# Patient Record
Sex: Male | Born: 1940 | Race: White | Hispanic: No | Marital: Married | Smoking: Never smoker
Health system: Southern US, Community
[De-identification: ages and names within clinical notes are randomized; demographics above are authoritative.]

## PROBLEM LIST (undated history)

## (undated) DIAGNOSIS — Z8719 Personal history of other diseases of the digestive system: Secondary | ICD-10-CM

## (undated) DIAGNOSIS — I4510 Unspecified right bundle-branch block: Secondary | ICD-10-CM

## (undated) DIAGNOSIS — E785 Hyperlipidemia, unspecified: Secondary | ICD-10-CM

## (undated) DIAGNOSIS — K219 Gastro-esophageal reflux disease without esophagitis: Secondary | ICD-10-CM

## (undated) DIAGNOSIS — Z8619 Personal history of other infectious and parasitic diseases: Secondary | ICD-10-CM

## (undated) DIAGNOSIS — I5032 Chronic diastolic (congestive) heart failure: Secondary | ICD-10-CM

## (undated) DIAGNOSIS — T7840XA Allergy, unspecified, initial encounter: Secondary | ICD-10-CM

## (undated) DIAGNOSIS — K922 Gastrointestinal hemorrhage, unspecified: Secondary | ICD-10-CM

## (undated) DIAGNOSIS — G43909 Migraine, unspecified, not intractable, without status migrainosus: Secondary | ICD-10-CM

## (undated) DIAGNOSIS — M5137 Other intervertebral disc degeneration, lumbosacral region: Secondary | ICD-10-CM

## (undated) DIAGNOSIS — E119 Type 2 diabetes mellitus without complications: Secondary | ICD-10-CM

## (undated) DIAGNOSIS — K635 Polyp of colon: Secondary | ICD-10-CM

## (undated) DIAGNOSIS — D329 Benign neoplasm of meninges, unspecified: Secondary | ICD-10-CM

## (undated) DIAGNOSIS — K649 Unspecified hemorrhoids: Secondary | ICD-10-CM

## (undated) DIAGNOSIS — H269 Unspecified cataract: Secondary | ICD-10-CM

## (undated) DIAGNOSIS — G709 Myoneural disorder, unspecified: Secondary | ICD-10-CM

## (undated) DIAGNOSIS — I2510 Atherosclerotic heart disease of native coronary artery without angina pectoris: Secondary | ICD-10-CM

## (undated) DIAGNOSIS — K259 Gastric ulcer, unspecified as acute or chronic, without hemorrhage or perforation: Secondary | ICD-10-CM

## (undated) DIAGNOSIS — I10 Essential (primary) hypertension: Secondary | ICD-10-CM

## (undated) DIAGNOSIS — N400 Enlarged prostate without lower urinary tract symptoms: Secondary | ICD-10-CM

## (undated) DIAGNOSIS — H409 Unspecified glaucoma: Secondary | ICD-10-CM

## (undated) DIAGNOSIS — K2270 Barrett's esophagus without dysplasia: Secondary | ICD-10-CM

## (undated) DIAGNOSIS — I240 Acute coronary thrombosis not resulting in myocardial infarction: Secondary | ICD-10-CM

## (undated) HISTORY — DX: Other intervertebral disc degeneration, lumbosacral region: M51.37

## (undated) HISTORY — PX: DIAGNOSTIC LAPAROSCOPY: SUR761

## (undated) HISTORY — PX: ESOPHAGUS SURGERY: SHX626

## (undated) HISTORY — PX: CORONARY ANGIOPLASTY: SHX604

## (undated) HISTORY — DX: Chronic diastolic (congestive) heart failure: I50.32

## (undated) HISTORY — PX: HERNIA REPAIR: SHX51

## (undated) HISTORY — PX: POLYPECTOMY: SHX149

## (undated) HISTORY — PX: CHOLECYSTECTOMY: SHX55

## (undated) HISTORY — PX: CARDIAC SURGERY: SHX584

## (undated) HISTORY — PX: EYE SURGERY: SHX253

## (undated) HISTORY — PX: HIATAL HERNIA REPAIR: SHX195

## (undated) HISTORY — PX: COLONOSCOPY: SHX174

## (undated) SURGERY — Surgical Case
Anesthesia: *Unknown

---

## 1990-07-17 HISTORY — PX: ATHERECTOMY: SHX47

## 2006-09-24 ENCOUNTER — Ambulatory Visit: Payer: Self-pay | Admitting: Urology

## 2006-09-24 ENCOUNTER — Other Ambulatory Visit: Payer: Self-pay

## 2006-10-03 ENCOUNTER — Ambulatory Visit: Payer: Self-pay | Admitting: Urology

## 2007-07-19 ENCOUNTER — Emergency Department: Payer: Self-pay | Admitting: Emergency Medicine

## 2007-07-19 ENCOUNTER — Other Ambulatory Visit: Payer: Self-pay

## 2008-11-03 ENCOUNTER — Ambulatory Visit: Payer: Self-pay | Admitting: Unknown Physician Specialty

## 2008-12-24 ENCOUNTER — Ambulatory Visit: Payer: Self-pay | Admitting: Unknown Physician Specialty

## 2009-02-08 ENCOUNTER — Ambulatory Visit: Payer: Self-pay | Admitting: Urology

## 2009-04-27 ENCOUNTER — Inpatient Hospital Stay: Payer: Self-pay | Admitting: Internal Medicine

## 2010-04-26 ENCOUNTER — Ambulatory Visit: Payer: Self-pay | Admitting: Internal Medicine

## 2010-07-17 DIAGNOSIS — Z8719 Personal history of other diseases of the digestive system: Secondary | ICD-10-CM

## 2010-07-17 HISTORY — DX: Personal history of other diseases of the digestive system: Z87.19

## 2011-02-10 ENCOUNTER — Inpatient Hospital Stay: Payer: Self-pay | Admitting: Internal Medicine

## 2011-04-24 DIAGNOSIS — D320 Benign neoplasm of cerebral meninges: Secondary | ICD-10-CM | POA: Insufficient documentation

## 2011-08-23 ENCOUNTER — Emergency Department: Payer: Self-pay | Admitting: Emergency Medicine

## 2011-08-23 LAB — CK TOTAL AND CKMB (NOT AT ARMC): CK-MB: 2 ng/mL (ref 0.5–3.6)

## 2011-08-23 LAB — COMPREHENSIVE METABOLIC PANEL
Albumin: 3.9 g/dL (ref 3.4–5.0)
Alkaline Phosphatase: 88 U/L (ref 50–136)
Anion Gap: 9 (ref 7–16)
BUN: 24 mg/dL — ABNORMAL HIGH (ref 7–18)
Bilirubin,Total: 0.3 mg/dL (ref 0.2–1.0)
Calcium, Total: 9 mg/dL (ref 8.5–10.1)
Chloride: 106 mmol/L (ref 98–107)
Creatinine: 1.3 mg/dL (ref 0.60–1.30)
Glucose: 113 mg/dL — ABNORMAL HIGH (ref 65–99)
Osmolality: 290 (ref 275–301)
Potassium: 4.5 mmol/L (ref 3.5–5.1)
SGOT(AST): 41 U/L — ABNORMAL HIGH (ref 15–37)
SGPT (ALT): 35 U/L
Total Protein: 7.4 g/dL (ref 6.4–8.2)

## 2011-08-23 LAB — CBC
HCT: 43.8 % (ref 40.0–52.0)
HGB: 14.7 g/dL (ref 13.0–18.0)
MCH: 32.3 pg (ref 26.0–34.0)
MCHC: 33.5 g/dL (ref 32.0–36.0)
WBC: 10.7 10*3/uL — ABNORMAL HIGH (ref 3.8–10.6)

## 2011-08-23 LAB — URINALYSIS, COMPLETE
Bilirubin,UR: NEGATIVE
Blood: NEGATIVE
Glucose,UR: NEGATIVE mg/dL (ref 0–75)
Ketone: NEGATIVE
Leukocyte Esterase: NEGATIVE
RBC,UR: 1 /HPF (ref 0–5)
Specific Gravity: 1.024 (ref 1.003–1.030)

## 2011-08-25 ENCOUNTER — Emergency Department: Payer: Self-pay | Admitting: Emergency Medicine

## 2011-08-25 LAB — URINALYSIS, COMPLETE
Bacteria: NONE SEEN
Glucose,UR: NEGATIVE mg/dL (ref 0–75)
Hyaline Cast: 5
Leukocyte Esterase: NEGATIVE
Nitrite: NEGATIVE
Protein: 100
RBC,UR: 1 /HPF (ref 0–5)
WBC UR: 1 /HPF (ref 0–5)

## 2011-08-25 LAB — COMPREHENSIVE METABOLIC PANEL
Alkaline Phosphatase: 172 U/L — ABNORMAL HIGH (ref 50–136)
Anion Gap: 8 (ref 7–16)
Bilirubin,Total: 1.3 mg/dL — ABNORMAL HIGH (ref 0.2–1.0)
Calcium, Total: 9.6 mg/dL (ref 8.5–10.1)
Chloride: 107 mmol/L (ref 98–107)
Co2: 29 mmol/L (ref 21–32)
Creatinine: 1.3 mg/dL (ref 0.60–1.30)
EGFR (African American): >60
Osmolality: 292 (ref 275–301)
Potassium: 4.8 mmol/L (ref 3.5–5.1)
Sodium: 144 mmol/L (ref 136–145)

## 2011-08-25 LAB — CBC
HCT: 44 % (ref 40.0–52.0)
HGB: 14.6 g/dL (ref 13.0–18.0)
MCH: 31.9 pg (ref 26.0–34.0)
MCHC: 33.2 g/dL (ref 32.0–36.0)
MCV: 96 fL (ref 80–100)
Platelet: 212 10*3/uL (ref 150–440)
RDW: 12.8 % (ref 11.5–14.5)
WBC: 9.5 10*3/uL (ref 3.8–10.6)

## 2011-08-25 LAB — PROTIME-INR
INR: 1
Prothrombin Time: 13.4 secs (ref 11.5–14.7)

## 2011-08-25 LAB — LIPASE, BLOOD: Lipase: 167 U/L (ref 73–393)

## 2011-08-25 LAB — APTT: Activated PTT: 33.2 secs (ref 23.6–35.9)

## 2011-09-08 ENCOUNTER — Ambulatory Visit: Payer: Self-pay | Admitting: Surgery

## 2011-09-08 LAB — CBC WITH DIFFERENTIAL/PLATELET
Basophil #: 0.1 10*3/uL (ref 0.0–0.1)
Basophil %: 1.1 %
Eosinophil #: 0.2 10*3/uL (ref 0.0–0.7)
HCT: 44.3 % (ref 40.0–52.0)
HGB: 14.6 g/dL (ref 13.0–18.0)
Lymphocyte #: 1.1 10*3/uL (ref 1.0–3.6)
Lymphocyte %: 17.1 %
MCH: 32.1 pg (ref 26.0–34.0)
MCHC: 32.8 g/dL (ref 32.0–36.0)
Monocyte #: 0.3 10*3/uL (ref 0.0–0.7)
Neutrophil #: 4.9 10*3/uL (ref 1.4–6.5)
RDW: 13.4 % (ref 11.5–14.5)

## 2011-09-08 LAB — BASIC METABOLIC PANEL
Anion Gap: 11 (ref 7–16)
BUN: 23 mg/dL — ABNORMAL HIGH (ref 7–18)
Calcium, Total: 10 mg/dL (ref 8.5–10.1)
Chloride: 105 mmol/L (ref 98–107)
Creatinine: 1.33 mg/dL — ABNORMAL HIGH (ref 0.60–1.30)
EGFR (African American): >60
Osmolality: 289 (ref 275–301)
Potassium: 4.2 mmol/L (ref 3.5–5.1)

## 2011-09-08 LAB — HEPATIC FUNCTION PANEL A (ARMC)
Alkaline Phosphatase: 146 U/L — ABNORMAL HIGH (ref 50–136)
Bilirubin, Direct: 0.1 mg/dL (ref 0.00–0.20)
Bilirubin,Total: 0.5 mg/dL (ref 0.2–1.0)
SGPT (ALT): 40 U/L
Total Protein: 7.8 g/dL (ref 6.4–8.2)

## 2011-09-15 ENCOUNTER — Ambulatory Visit: Payer: Self-pay | Admitting: Surgery

## 2011-09-16 LAB — COMPREHENSIVE METABOLIC PANEL
Albumin: 3 g/dL — ABNORMAL LOW (ref 3.4–5.0)
Anion Gap: 9 (ref 7–16)
BUN: 19 mg/dL — ABNORMAL HIGH (ref 7–18)
Bilirubin,Total: 0.8 mg/dL (ref 0.2–1.0)
Chloride: 107 mmol/L (ref 98–107)
Co2: 26 mmol/L (ref 21–32)
Creatinine: 1.31 mg/dL — ABNORMAL HIGH (ref 0.60–1.30)
Glucose: 98 mg/dL (ref 65–99)
Osmolality: 285 (ref 275–301)
Potassium: 4.3 mmol/L (ref 3.5–5.1)
Sodium: 142 mmol/L (ref 136–145)

## 2011-09-16 LAB — CBC WITH DIFFERENTIAL/PLATELET
Basophil #: 0 10*3/uL (ref 0.0–0.1)
Eosinophil #: 0.2 10*3/uL (ref 0.0–0.7)
HCT: 37.7 % — ABNORMAL LOW (ref 40.0–52.0)
HGB: 12.4 g/dL — ABNORMAL LOW (ref 13.0–18.0)
MCH: 31.9 pg (ref 26.0–34.0)
MCV: 97 fL (ref 80–100)
Monocyte %: 5.6 %
Neutrophil #: 8.6 10*3/uL — ABNORMAL HIGH (ref 1.4–6.5)
Neutrophil %: 86.3 %
Platelet: 174 10*3/uL (ref 150–440)
RBC: 3.9 10*6/uL — ABNORMAL LOW (ref 4.40–5.90)
WBC: 10 10*3/uL (ref 3.8–10.6)

## 2011-09-28 ENCOUNTER — Ambulatory Visit: Payer: Self-pay | Admitting: Surgery

## 2011-09-28 LAB — COMPREHENSIVE METABOLIC PANEL
Albumin: 3.7 g/dL (ref 3.4–5.0)
Anion Gap: 6 — ABNORMAL LOW (ref 7–16)
BUN: 18 mg/dL (ref 7–18)
Bilirubin,Total: 0.5 mg/dL (ref 0.2–1.0)
Calcium, Total: 9.5 mg/dL (ref 8.5–10.1)
Creatinine: 1.44 mg/dL — ABNORMAL HIGH (ref 0.60–1.30)
EGFR (African American): >60
EGFR (Non-African Amer.): 51 — ABNORMAL LOW
Glucose: 100 mg/dL — ABNORMAL HIGH (ref 65–99)
Potassium: 4.5 mmol/L (ref 3.5–5.1)
SGOT(AST): 24 U/L (ref 15–37)
SGPT (ALT): 59 U/L
Total Protein: 7.4 g/dL (ref 6.4–8.2)

## 2011-10-11 ENCOUNTER — Emergency Department: Payer: Self-pay | Admitting: *Deleted

## 2011-10-11 LAB — COMPREHENSIVE METABOLIC PANEL
Albumin: 3.9 g/dL (ref 3.4–5.0)
Anion Gap: 9 (ref 7–16)
BUN: 23 mg/dL — ABNORMAL HIGH (ref 7–18)
Bilirubin,Total: 0.3 mg/dL (ref 0.2–1.0)
Chloride: 106 mmol/L (ref 98–107)
Co2: 27 mmol/L (ref 21–32)
EGFR (African American): >60
EGFR (Non-African Amer.): >60
Glucose: 114 mg/dL — ABNORMAL HIGH (ref 65–99)
Osmolality: 288 (ref 275–301)
SGPT (ALT): 31 U/L
Sodium: 142 mmol/L (ref 136–145)
Total Protein: 7.5 g/dL (ref 6.4–8.2)

## 2011-10-11 LAB — TROPONIN I: Troponin-I: <0.02 ng/mL

## 2011-10-11 LAB — CBC
HCT: 43 % (ref 40.0–52.0)
HGB: 14.4 g/dL (ref 13.0–18.0)
MCV: 97 fL (ref 80–100)
Platelet: 241 10*3/uL (ref 150–440)
RBC: 4.43 10*6/uL (ref 4.40–5.90)
WBC: 12.3 10*3/uL — ABNORMAL HIGH (ref 3.8–10.6)

## 2011-11-01 ENCOUNTER — Ambulatory Visit: Payer: Self-pay | Admitting: Internal Medicine

## 2011-12-13 ENCOUNTER — Ambulatory Visit: Payer: Self-pay | Admitting: Gastroenterology

## 2012-03-04 ENCOUNTER — Ambulatory Visit: Payer: Self-pay | Admitting: Neurology

## 2012-03-04 LAB — CREATININE, SERUM
Creatinine: 1.31 mg/dL — ABNORMAL HIGH (ref 0.60–1.30)
EGFR (African American): >60

## 2012-04-01 DIAGNOSIS — N138 Other obstructive and reflux uropathy: Secondary | ICD-10-CM | POA: Insufficient documentation

## 2012-04-01 DIAGNOSIS — N529 Male erectile dysfunction, unspecified: Secondary | ICD-10-CM | POA: Insufficient documentation

## 2012-04-01 DIAGNOSIS — N401 Enlarged prostate with lower urinary tract symptoms: Secondary | ICD-10-CM | POA: Insufficient documentation

## 2012-04-01 DIAGNOSIS — R972 Elevated prostate specific antigen [PSA]: Secondary | ICD-10-CM | POA: Insufficient documentation

## 2012-04-01 DIAGNOSIS — D075 Carcinoma in situ of prostate: Secondary | ICD-10-CM | POA: Diagnosis present

## 2012-04-10 IMAGING — CR DG CHEST 2V
1 series · 2 of 2 positions shown · non-contrast
Comparison: none

REASON FOR EXAM: pain
COMMENTS:   May transport without cardiac monitor

PROCEDURE:     DXR - DXR CHEST PA (OR AP) AND LATERAL  - October 11, 2011  [DATE]
RESULT:     Comparison: 09/08/2011

[Series 1: w chest pa · 0.14mm/px · 2 of 2 slices shown]
[im 1/2]
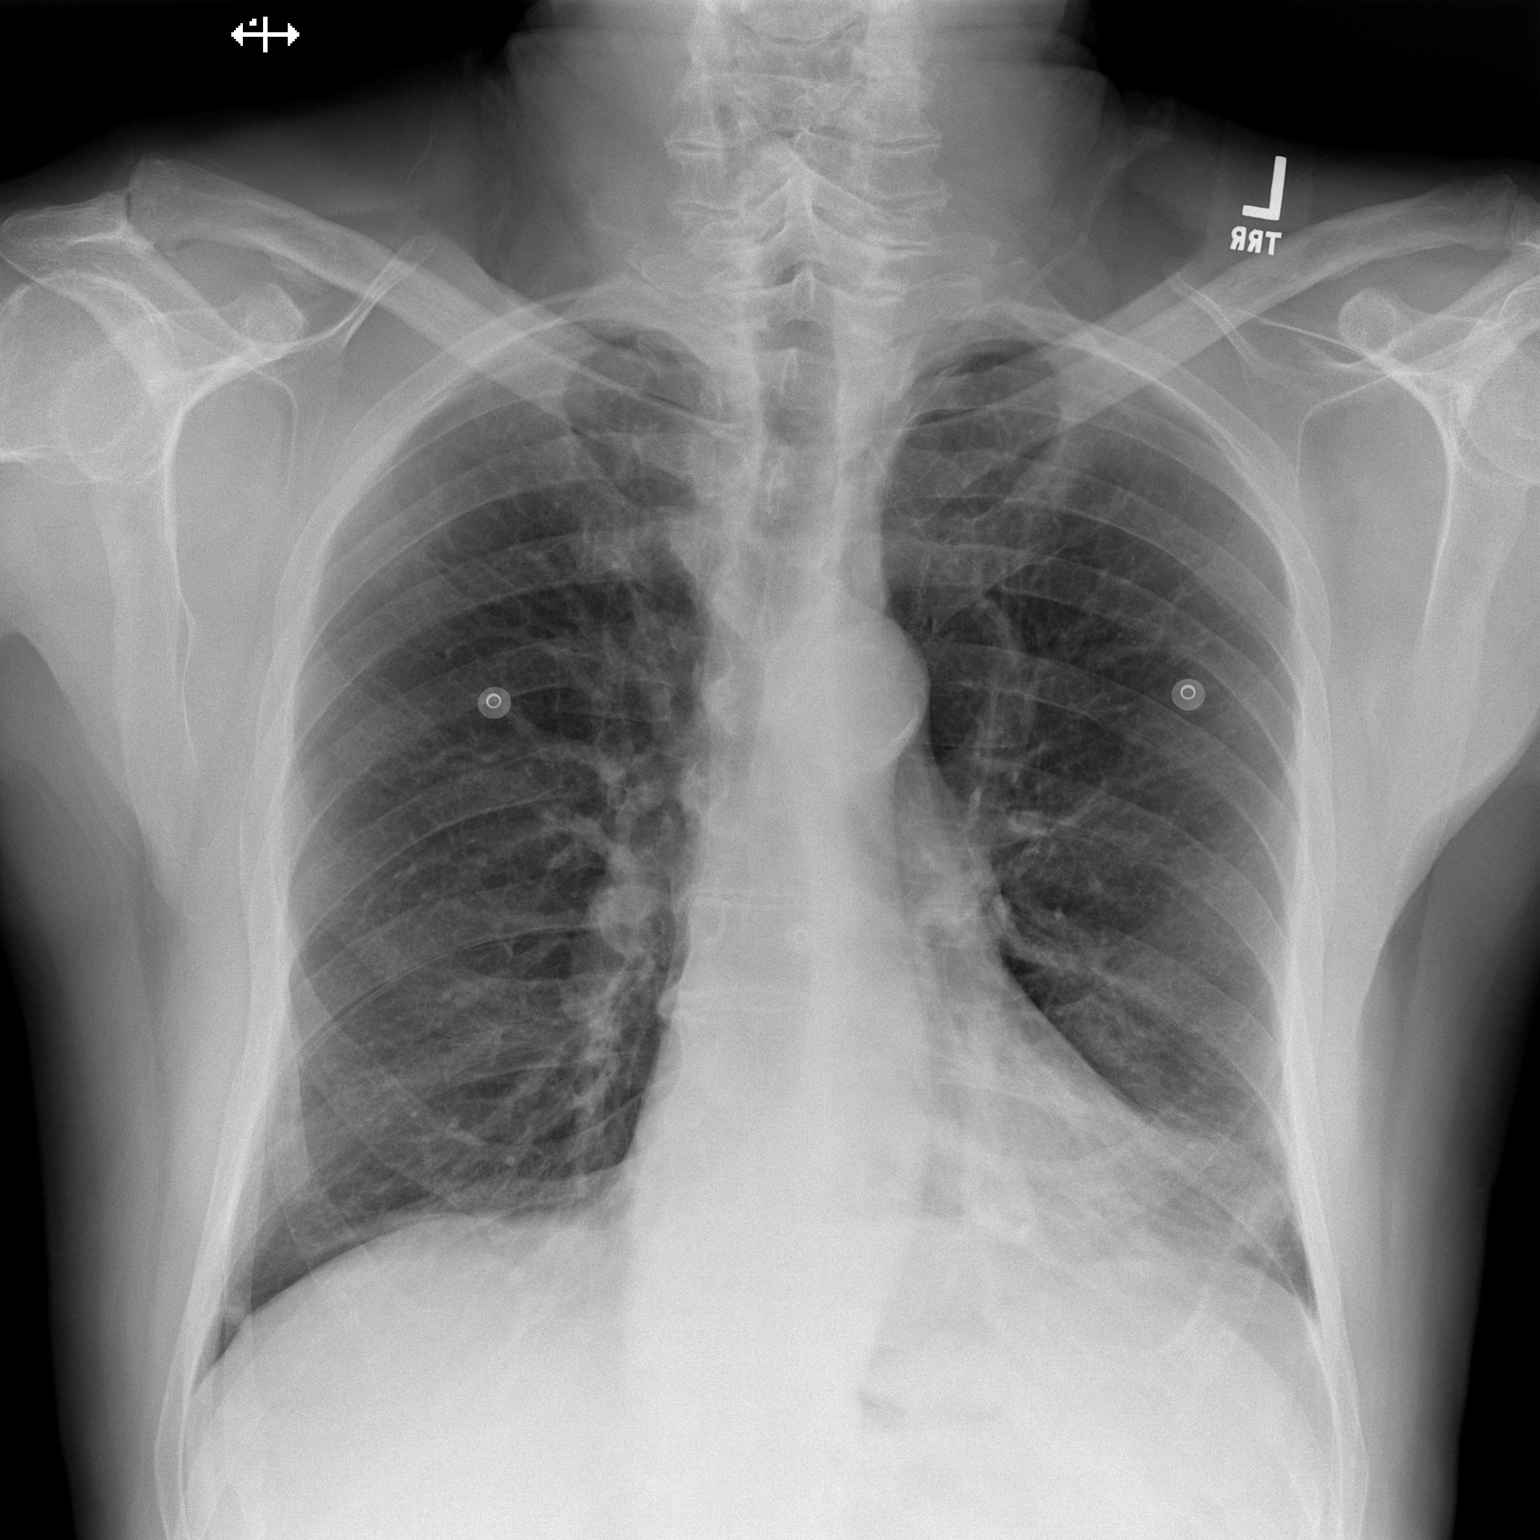
[im 2/2]
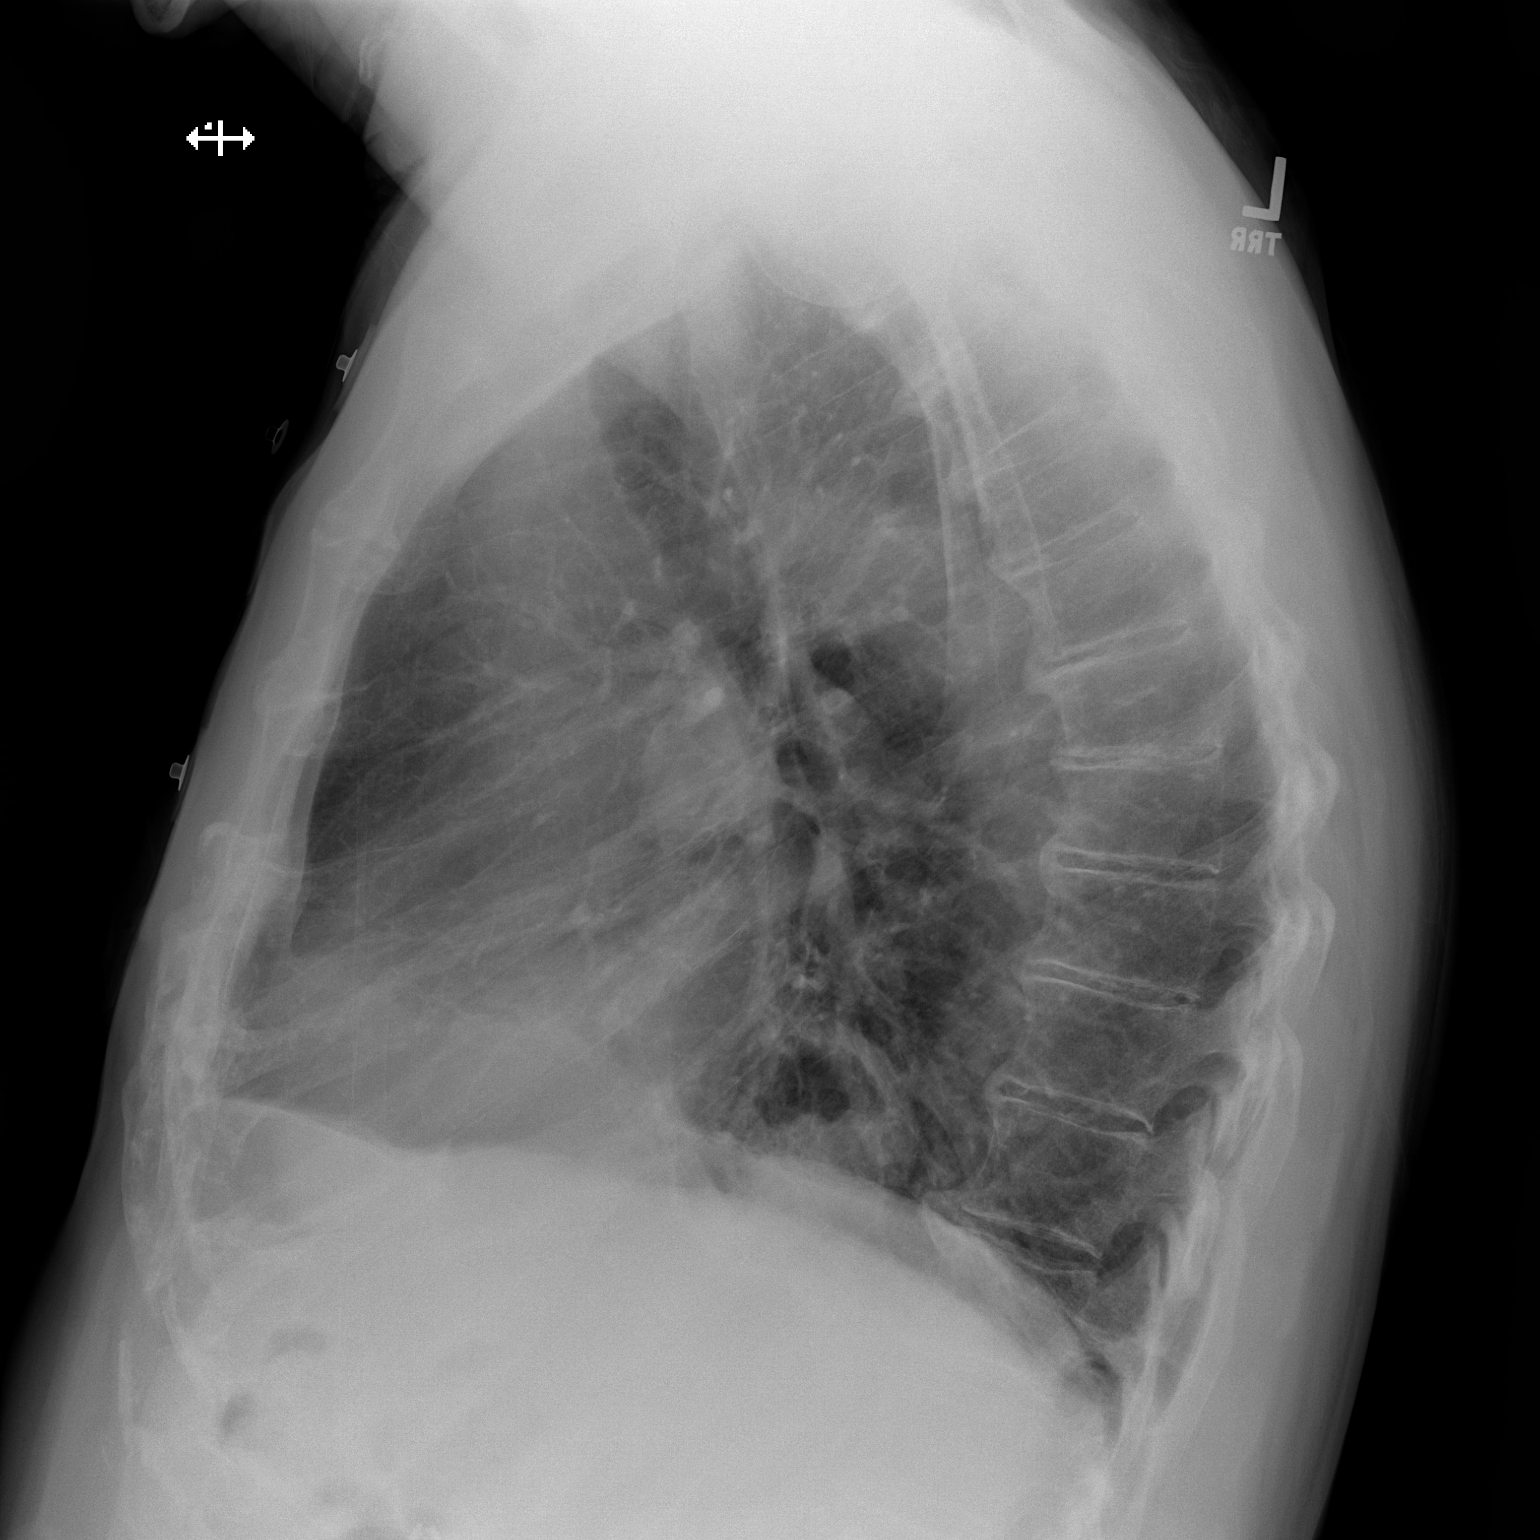

[2 of 2 positions shown; findings below may reference images not displayed]

FINDINGS: The heart and mediastinum are stable. There is mild hyperinflation. Minimal
left basilar reticular opacities may represent atelectasis or scarring.
There is a small hiatal hernia.
IMPRESSION: Minimal left basilar opacities may represent atelectasis or scarring.
However, followup is suggested to ensure resolution/stability.

## 2012-07-26 ENCOUNTER — Ambulatory Visit: Payer: Self-pay | Admitting: Gastroenterology

## 2012-08-21 ENCOUNTER — Ambulatory Visit: Payer: Self-pay | Admitting: Surgery

## 2012-09-25 ENCOUNTER — Ambulatory Visit: Payer: Self-pay | Admitting: Surgery

## 2012-09-25 LAB — CBC
HCT: 42.4 % (ref 40.0–52.0)
MCHC: 33.7 g/dL (ref 32.0–36.0)
MCV: 96 fL (ref 80–100)
Platelet: 214 10*3/uL (ref 150–440)
RBC: 4.42 10*6/uL (ref 4.40–5.90)
RDW: 13.3 % (ref 11.5–14.5)

## 2012-09-25 LAB — BASIC METABOLIC PANEL
Anion Gap: 7 (ref 7–16)
BUN: 26 mg/dL — ABNORMAL HIGH (ref 7–18)
Chloride: 109 mmol/L — ABNORMAL HIGH (ref 98–107)
Co2: 24 mmol/L (ref 21–32)
EGFR (African American): >60
Potassium: 4.3 mmol/L (ref 3.5–5.1)

## 2012-10-04 ENCOUNTER — Inpatient Hospital Stay: Payer: Self-pay | Admitting: Surgery

## 2012-12-13 ENCOUNTER — Ambulatory Visit: Payer: Self-pay | Admitting: Gastroenterology

## 2013-02-19 ENCOUNTER — Ambulatory Visit: Payer: Self-pay | Admitting: Gastroenterology

## 2013-02-19 HISTORY — PX: ESOPHAGOGASTRODUODENOSCOPY: SHX1529

## 2013-02-20 LAB — PATHOLOGY REPORT

## 2013-10-09 DIAGNOSIS — I209 Angina pectoris, unspecified: Secondary | ICD-10-CM | POA: Insufficient documentation

## 2013-10-09 DIAGNOSIS — K229 Disease of esophagus, unspecified: Secondary | ICD-10-CM | POA: Insufficient documentation

## 2013-10-09 DIAGNOSIS — D126 Benign neoplasm of colon, unspecified: Secondary | ICD-10-CM | POA: Insufficient documentation

## 2013-10-15 ENCOUNTER — Ambulatory Visit: Payer: Self-pay | Admitting: Unknown Physician Specialty

## 2014-01-12 DIAGNOSIS — R079 Chest pain, unspecified: Secondary | ICD-10-CM | POA: Insufficient documentation

## 2014-03-09 ENCOUNTER — Ambulatory Visit: Payer: Self-pay | Admitting: Gastroenterology

## 2014-03-13 ENCOUNTER — Ambulatory Visit: Payer: Self-pay | Admitting: Gastroenterology

## 2014-06-05 DIAGNOSIS — I340 Nonrheumatic mitral (valve) insufficiency: Secondary | ICD-10-CM | POA: Insufficient documentation

## 2014-06-05 DIAGNOSIS — E782 Mixed hyperlipidemia: Secondary | ICD-10-CM | POA: Insufficient documentation

## 2014-06-05 DIAGNOSIS — I10 Essential (primary) hypertension: Secondary | ICD-10-CM

## 2014-06-05 HISTORY — DX: Essential (primary) hypertension: I10

## 2014-06-16 DIAGNOSIS — K219 Gastro-esophageal reflux disease without esophagitis: Secondary | ICD-10-CM | POA: Insufficient documentation

## 2014-06-17 ENCOUNTER — Ambulatory Visit: Payer: Self-pay

## 2014-06-26 ENCOUNTER — Ambulatory Visit: Payer: Self-pay | Admitting: Family Medicine

## 2014-06-28 ENCOUNTER — Ambulatory Visit: Payer: Self-pay

## 2014-11-06 NOTE — Op Note (Signed)
PATIENT NAME:  Bradley [name], Bradley [name] MR#:  [id number] DATE OF BIRTH:  1940/10/21  DATE OF PROCEDURE:  10/04/2012  PREOPERATIVE DIAGNOSIS:  Hiatus hernia with chronic gastroesophageal reflux.   POSTOPERATIVE DIAGNOSIS: Hiatus hernia with chronic gastroesophageal reflux.  PROCEDURE: Laparoscopic hiatus hernia repair with fundoplication.   SURGEON:  Rochel Brome, M.D.   ANESTHESIA: General.   INDICATIONS: This 74 year old male has a long history of gastroesophageal reflux and x-ray findings of paraesophageal hiatus hernia. Surgery was recommended for definitive treatment.   DESCRIPTION OF PROCEDURE: The patient was placed on the operating table in the supine position under general endotracheal anesthesia. The abdomen was prepared with ChloraPrep, draped in a sterile manner.   A short incision was made in the epigastrium just about an inch and a half above the umbilicus and carried down to the deep fascia which was grasped with tooth pickups and elevated. A Veress needle was inserted into the peritoneal cavity, aspirated and irrigated with a saline solution.  Next, the peritoneal cavity was inflated with carbon dioxide. The Veress needle was removed. The 10 mm cannula was inserted. The 10 mm, 30 degree laparoscope was inserted to view the peritoneal cavity. The liver appeared smooth. There was some mild distention of the stomach. I did have the anesthetist insert an orogastric tube to decompress the stomach. Another incision was made in the subxiphoid position to insert another 11 mm cannula. Another incision made in the left upper quadrant at the costal margin at the anterior axillary line to insert an 11 mm cannula. Another incision made in the right upper quadrant at the costal margin at the midclavicular line to insert an 11 mm cannula, another midway between that point and the camera site and ultimately another in the right upper quadrant at the anterior axillary line for a total of 6 ports sites. The  patient was placed in the reverse Trendelenburg position. The fan retractor was introduced through the subxiphoid port and was used to retract the left lobe of the liver anteriorly and superiorly and this was held in place with a Bookwalter mechanical arm. This exposed the diaphragmatic hiatus. There was herniation of the stomach up through an enlarged hiatus. Traction was applied as the stomach was initially partially reduced. There was a large amount of fat in the immediate area of the operation.  Next, the gastrohepatic ligament was incised with Harmonic scalpel. The dissection of the hernia sac was began anteriorly from right to left going along the edge of the diaphragmatic hiatus dividing the sac with the Harmonic scalpel.  Next, with somewhat tedious and persistent dissection, a large amount of omentum was brought down into the abdomen and also the remainder of the stomach was delivered down into the abdominal cavity. Circumferential dissection was carried out around the esophagus and completely dissected the hernia sac circumferentially. The right and left crura of the diaphragm were identified. A window was created posterior to the esophagus and was triangulated to some 3 cm and Babcock clamp was placed posterior to the esophagus extending from right to left. It is noted that during the course of the procedure, the fatty tissues were retracted as needed and the Babcock clamp on the stomach was repositioned as needed.  The repair was carried out with a row of 0-Surgidac simple and figure-of-eight sutures suturing the right crus of the diaphragm to the left crus and this was carried out to narrow the hiatus and used a 10 mm stone scoop to size the hiatus and left  enough opening to allow swallowing, so that the stone scoop would easily fit beside the esophagus.  The stone scoop was then removed.   Next, a Gore-Tex Bio-A mesh was selected, had a slot cut out to straddle the esophagus and some of the  corners were trimmed. The mesh was placed over the repair and was loosely placed across the posterior aspect of the esophagus.  Next, a mesh was attached to the diaphragm with the ProTack stapler.  It is noted during the course of the procedure, there was minimal degree of blood loss. Small amounts of blood were aspirated. Hemostasis subsequently was intact.  Next, the fundoplication was carried out, passing a portion of the fundus from left to right posterior to the esophagus and held with a Babcock clamp. Another portion of the fundus was brought adjacent to this as fatty tissues were retracted inferiorly. The fundoplication was carried out with 4 sutures of 0-Surgidac. The fundoplication was satisfactorily floppy. As noted, the Surgidac was used for both the hernia repair and for the fundoplication.  Hemostasis appeared to be intact.  Next, as the cannulas were removed, I did note some bleeding from the right upper quadrant port site at the midclavicular line and the deep tissue surrounding this were infiltrated with 0.5% Sensorcaine with epinephrine and also there was some scant bleeding from the lateralmost port site on the right and this area was also infiltrated and I did also infiltrate the subcuticular layer for all 6 incisions. All the cannulas were removed and carbon dioxide was allowed to escape in the peritoneal cavity. Skin incisions were closed with interrupted 4-0 chromic subcuticular suture and Dermabond. The patient tolerated surgery satisfactorily. Due to the long case and more than 2 liters of IV fluids, I elected to insert a Foley catheter and had the circulating nurse insert this.  The  catheter did go in easily and I inflated the balloon with 5 mL of water. There was minimal urine output at first and decision was made to leave the catheter in to recovery room and subsequently in the recovery room did note some gross hematuria and I elected to keep the catheter in until resolution of  bleeding and the patient was carried to the operating room and otherwise in satisfactory condition.    ____________________________ Lenna Sciara. Rochel Brome, MD jws:ce D: 10/04/2012 18:41:55 ET T: 10/05/2012 12:02:29 ET JOB#: [id number]  cc: Loreli Dollar, MD, <Dictator> Loreli Dollar MD ELECTRONICALLY SIGNED 10/10/2012 20:48

## 2014-11-08 NOTE — H&P (Signed)
PATIENT NAME:  Bradley [name], SCHWEGLER MR#:  [id number] DATE OF BIRTH:  Jul 19, 1940  DATE OF ADMISSION:  09/15/2011  CHIEF COMPLAINT: Epigastric pain.   HISTORY OF PRESENT ILLNESS: This is a patient who has been in the emergency room  multiple times. He has had multiple episodes of epigastric and right upper quadrant pain associated with fatty food intolerance. He has had nausea, gas, bloating, and distention. He has no jaundice or acholic stools, but has had elevated liver function tests in the past. He is here for elective laparoscopic cholecystectomy with cholangiography.   PAST MEDICAL HISTORY:  1. Reflux disease. 2. Heart disease. 3. Migraines. 4. Gallstones.   PAST SURGICAL HISTORY:  1. Angioplasty. 2. Cataract extraction.   MEDICATIONS: Atorvastatin, Avodart, and omeprazole.   ALLERGIES: Sulfonamides.   SOCIAL HISTORY: The patient does not drink alcohol and has never been a smoker. He is retired.   FAMILY HISTORY: Noncontributory.   REVIEW OF SYSTEMS: Ten systems have been reviewed and is negative with the exception of that mentioned in the history of present illness.   NOTE: The patient has been cleared for surgery by Dr. Nehemiah Massed.    PHYSICAL EXAMINATION:   GENERAL: Healthy Caucasian male patient.   HEENT: No scleral icterus.   NECK: No palpable neck nodes.   CHEST: Clear to auscultation.   CARDIAC: Regular rate and rhythm.   ABDOMEN: Soft and nontender.   EXTREMITIES: No edema. Calves are nontender.   NEUROLOGIC: Grossly intact.   INTEGUMENT: No jaundice.   LABS: Laboratory tests showed elevated liver function tests back in the first week in February, none have been repeated.   ASSESSMENT AND PLAN: This is a patient with symptomatic gallstones. He has also had elevated liver function tests. The plan is to perform laparoscopic cholecystectomy with cholangiography. The rationale for this has been discussed, the options of observation have been reviewed, and the risks  of bleeding, infection, bile duct damage, bile duct leak, and retained common bile duct stone, any of which could require further surgery and/or ERCP, stent, and papillotomy have all been reviewed with him. He understood and agreed to proceed. ____________________________ Jerrol Banana Burt Knack, MD rec:slb D: 09/14/2011 20:29:28 ET     T: 09/15/2011 07:17:19 ET        JOB#: [id number] cc: Jerrol Banana. Burt Knack, MD, <Dictator> Florene Glen MD ELECTRONICALLY SIGNED 09/15/2011 11:31

## 2014-11-08 NOTE — Op Note (Signed)
PATIENT NAME:  Bradley [name], Bradley [name] MR#:  [id number] DATE OF BIRTH:  1940-12-03  DATE OF PROCEDURE:  09/15/2011  PREOPERATIVE DIAGNOSIS: Cholelithiasis.   POSTOPERATIVE DIAGNOSIS: Choledocholithiasis.  PROCEDURE: Laparoscopic cholecystectomy with C-arm fluoroscopic cholangiography.   SURGEON: Janae Bonser E. Burt Knack, MD  ANESTHESIA: General with endotracheal tube.   INDICATIONS: This is a patient with recurrent episodes of right upper quadrant pain associated with fatty food intolerance. He has been to the ED multiple times and he has shown elevated liver function tests in the past. Preoperatively we discussed rationale for surgery, the options of observation, risk of bleeding, infection, recurrence of symptoms, failure to resolve his symptoms, open procedure, bile duct damage, bile duct leak, retained common bile duct stone, any of which could require further surgery and/or ERCP, stent, and papillotomy. This was all reviewed for he and his wife in the preop holding area. They understood and agreed to proceed.   FINDINGS: Initially on C-arm fluoroscopic cholangiography it was clear that the cystic duct had been cannulated. The proximal ducts were well identified and in the distal common bile duct there was what appeared to be sludge fairly irregular in shape inside a dilated distal common bile duct. It moved with injection but persisted.   After administration of glucagon these intraluminal filling defects disappeared and there was ghosting of the duodenum without a large amount of flow into the duodenum but the intraluminal defects were no longer identified.   DESCRIPTION OF PROCEDURE: Patient was induced to general anesthesia, given IV antibiotics, VTE prophylaxis was in place. He was prepped and draped in sterile fashion. Marcaine was infiltrated in the skin and subcutaneous tissues around the periumbilical area. Incision was made. Veress needle was placed. Pneumoperitoneum was obtained. 5 mm trocar port  was placed. The abdominal cavity was explored and under direct vision a 10 mm epigastric port and two lateral 5 mm ports were placed. The gallbladder was placed on tension. Peritoneum over the infundibulum was incised bluntly. The cystic duct gallbladder junction was well identified, clipped and incised and through a separate incision an Angiocath cholangiogram catheter was placed. C-arm fluoroscopic cholangiography demonstrated sludge in the bile duct and at this point with it persisting C-arm fluoroscopy was halted for a moment. Patient was given 1 mg of IV glucagon and after appropriate time period irrigation with 20 mL of saline was performed. Cholangiography was repeated. The intraluminal defects were no longer identified in the dilated duct and there was typical ghosting of the duodenum but there was no large amount of flow into the duodenum but again no intraluminal defects were identified.  At this point the cystic duct catheter was removed. The cystic duct was doubly clipped and divided. The gallbladder was taken from the gallbladder fossa with electrocautery after doubly clipping and dividing two separate branches of the cystic artery and then the gallbladder was completely removed from the gallbladder fossa. Hemostasis was with electrocautery. The gallbladder was passed out through the epigastric port site with the aid of an Endo Catch bag. The area was checked for hemostasis, irrigated with copious amounts of normal saline. There was no sign of bleeding, bile leak, or bowel injury. Camera was placed in the epigastric site to view back at the periumbilical site. Again, no sign of bowel injury or adhesions. Therefore pneumoperitoneum was released. All ports were removed. Fascial edges at the epigastric site were approximated with 0 Vicryl figure-of-eight sutures and then 4-0 subcuticular Monocryl was used on all skin edges. Steri-Strips, Mastisol, and sterile dressings  were placed.   Patient tolerated  the procedure well. There were no complications. He was taken to the recovery was stable condition to be admitted for continued care.   ____________________________ Bradley [name]. Burt Knack, MD rec:cms D: 09/15/2011 09:43:22 ET T: 09/15/2011 11:01:49 ET JOB#: [id number]  cc: Bradley [name]. Burt Knack, MD, <Dictator> Florene Glen MD ELECTRONICALLY SIGNED 09/15/2011 11:31

## 2014-11-20 ENCOUNTER — Ambulatory Visit: Payer: Medicare Other

## 2014-11-20 ENCOUNTER — Ambulatory Visit
Admission: EM | Admit: 2014-11-20 | Discharge: 2014-11-20 | Payer: Medicare Other | Attending: Internal Medicine | Admitting: Internal Medicine

## 2014-11-20 DIAGNOSIS — S0592XA Unspecified injury of left eye and orbit, initial encounter: Secondary | ICD-10-CM | POA: Diagnosis present

## 2014-11-20 DIAGNOSIS — T1582XA Foreign body in other and multiple parts of external eye, left eye, initial encounter: Secondary | ICD-10-CM | POA: Insufficient documentation

## 2014-11-20 DIAGNOSIS — T1592XA Foreign body on external eye, part unspecified, left eye, initial encounter: Secondary | ICD-10-CM

## 2014-11-20 DIAGNOSIS — X58XXXA Exposure to other specified factors, initial encounter: Secondary | ICD-10-CM | POA: Diagnosis not present

## 2014-11-20 DIAGNOSIS — I2510 Atherosclerotic heart disease of native coronary artery without angina pectoris: Secondary | ICD-10-CM | POA: Insufficient documentation

## 2014-11-20 DIAGNOSIS — K219 Gastro-esophageal reflux disease without esophagitis: Secondary | ICD-10-CM | POA: Insufficient documentation

## 2014-11-20 HISTORY — DX: Atherosclerotic heart disease of native coronary artery without angina pectoris: I25.10

## 2014-11-20 HISTORY — DX: Benign neoplasm of meninges, unspecified: D32.9

## 2014-11-20 NOTE — Discharge Instructions (Signed)
°  Direct transfer to [facility] for additional evaluation of possible imbedded particle left eye cornea medial edge

## 2014-11-20 NOTE — ED Notes (Signed)
This morning while grinding felt something in left eye.  Irrigated out but did not improve the sensation of something in his eye

## 2014-11-20 NOTE — ED Provider Notes (Signed)
CSN: [id number]     Arrival date & time 11/20/14  1240 History   First MD Initiated Contact with Patient 11/20/14 1347     Chief Complaint  Patient presents with  . Eye Injury    Patient grinding metal without goggles on earlier today -felt particles in left - irritating   (Consider location/radiation/quality/duration/timing/severity/associated sxs/prior Treatment) Patient is a 74 y.o. male presenting with eye injury. The history is provided by the patient.  Eye Injury This is a new problem. The current episode started 3 to 5 hours ago. The problem has been gradually improving. Pertinent negatives include no chest pain, no abdominal pain, no headaches and no shortness of breath. Nothing aggravates the symptoms. Nothing relieves the symptoms. He has tried a warm compress and water for the symptoms. The treatment provided mild relief.   Patient has known Past Medical History  Diagnosis Date  . Coronary artery disease   . Meningioma     stable, followed by yearly MRIs   Past Surgical History  Procedure Laterality Date  . Cardiac surgery    . Cholecystectomy    . Hiatal hernia repair N/A    History reviewed. No pertinent family history. History  Substance Use Topics  . Smoking status: Never Smoker   . Smokeless tobacco: Not on file  . Alcohol Use: No   Patient doing metal grinding earlier today with glasses on/but not goggles.  Felt particles in the left eye initially laterally, and then superior medially. Now feels better  And he is hopeful it is gone. Denies any visual changes; minimal tearing  initially but that has resolved. 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 States that it has been stable, MRI tracking. He is concerned that we determine if there is any metal in his eye "for his MRI" He wears bilateral hearing aids. He has had angiolplasty 1992; long standing GERD dx.  Review of Systems   Respiratory: Negative for shortness of breath.   Cardiovascular: Negative for chest pain.  Gastrointestinal: Negative for abdominal pain.  Neurological: Negative for headaches.    Allergies  Oxycodone and Sulfa antibiotics  Home Medications   Prior to Admission medications   Medication Sig Start Date End Date Taking? Authorizing Provider  aspirin 81 MG tablet Take 81 mg by mouth daily.   Yes Historical Provider, MD  atorvastatin (LIPITOR) 20 MG tablet Take 20 mg by mouth daily.   Yes Historical Provider, MD  cyanocobalamin 100 MCG tablet Take 100 mcg by mouth daily.   Yes Historical Provider, MD  dexlansoprazole (DEXILANT) 60 MG capsule Take 60 mg by mouth daily.   Yes Historical Provider, MD  dutasteride (AVODART) 0.5 MG capsule Take 0.5 mg by mouth daily.   Yes Historical Provider, MD   BP 119/75 mmHg  Pulse 69  Temp(Src) 97.7 F (36.5 C) (Oral)  Resp 16  Ht 6\' 3"  (1.905 m)  Wt 230 lb (104.327 kg)  BMI 28.75 kg/m2  SpO2 96% Physical Exam  Constitutional: He is oriented to person, place, and time. He appears well-developed and well-nourished.  HENT:  Head: Normocephalic and atraumatic.  Eyes: EOM are normal. Pupils are equal, round, and reactive to light. Right eye exhibits no discharge. Left eye exhibits no discharge, no exudate and no hordeolum. Foreign body present in the left eye. No scleral icterus. Right eye exhibits normal extraocular motion and no nystagmus. Left eye exhibits normal extraocular motion.  Neck: Normal range  of motion. Neck supple. No thyromegaly present.  Cardiovascular: Normal rate and regular rhythm.   Occasional extrasystoles are present.  Pulmonary/Chest: Effort normal and breath sounds normal.  Abdominal: Soft. Bowel sounds are normal.  Musculoskeletal: Normal range of motion.  Neurological: He is alert and oriented to person, place, and time.  Skin: Skin is warm and dry.  Psychiatric: He has a normal mood and affect.   Wears bilateral hearing  aids. He denies any visual change.  ED Course  Procedures (including critical care time)   Following receipt of negative xrays for metallic foreign body  his left eye was irrigated. Tetracaine 2 qtts plus 2 qtts placed and fluroscein dye applied. No evidence of scratching evident on cornea but there is an single particle at 9 oclock on the corneal margin that appears to be imbedded. Flushed without change of position- no change with blink-not change with additional flush or gentle wet q-tip touch. He is not uncomfortable and not particularly concerned at this time. I am concerned this represents foreign body and [place]. They will see him in transfer immediately and he accepts.     Labs Review Labs Reviewed - No data to display  Imaging Review Dg Eye Foreign Body  11/20/2014   CLINICAL DATA:  Patient grinding metal earlier today with transient pain left eye. Concern for potential intraorbital radiopaque foreign body.  EXAM: ORBITS FOR FOREIGN BODY - 2 VIEW  COMPARISON:  None.  FINDINGS: Water's views with eyes deviated toward the left and toward the right were obtained. No intraorbital radiopaque foreign body seen. No fracture or dislocation. Paranasal sinuses clear.  IMPRESSION: No evidence of metallic foreign body within the orbits.   Electronically Signed   By: Lowella Grip III M.D.   On: 11/20/2014 14:48     MDM   1. Foreign body, eye, left, initial encounter        Jan Fireman, PA-C 11/20/14 1824

## 2014-12-10 DIAGNOSIS — I2510 Atherosclerotic heart disease of native coronary artery without angina pectoris: Secondary | ICD-10-CM | POA: Insufficient documentation

## 2014-12-16 DIAGNOSIS — I5032 Chronic diastolic (congestive) heart failure: Secondary | ICD-10-CM

## 2014-12-16 HISTORY — DX: Chronic diastolic (congestive) heart failure: I50.32

## 2015-01-07 ENCOUNTER — Encounter
Admission: RE | Disposition: A | Discharge status: Home / Self Care | Payer: Self-pay | Source: Ambulatory Visit | Attending: Internal Medicine

## 2015-01-07 ENCOUNTER — Ambulatory Visit
Admission: RE | Admit: 2015-01-07 | Discharge: 2015-01-07 | Disposition: A | Discharge status: Home / Self Care | Payer: Medicare Other | Source: Ambulatory Visit | Attending: Internal Medicine | Admitting: Internal Medicine

## 2015-01-07 ENCOUNTER — Encounter: Payer: Self-pay | Admitting: *Deleted

## 2015-01-07 DIAGNOSIS — E785 Hyperlipidemia, unspecified: Secondary | ICD-10-CM | POA: Diagnosis not present

## 2015-01-07 DIAGNOSIS — K219 Gastro-esophageal reflux disease without esophagitis: Secondary | ICD-10-CM | POA: Insufficient documentation

## 2015-01-07 DIAGNOSIS — Z885 Allergy status to narcotic agent status: Secondary | ICD-10-CM | POA: Insufficient documentation

## 2015-01-07 DIAGNOSIS — Z86010 Personal history of colonic polyps: Secondary | ICD-10-CM | POA: Insufficient documentation

## 2015-01-07 DIAGNOSIS — Z86011 Personal history of benign neoplasm of the brain: Secondary | ICD-10-CM | POA: Insufficient documentation

## 2015-01-07 DIAGNOSIS — Z7982 Long term (current) use of aspirin: Secondary | ICD-10-CM | POA: Insufficient documentation

## 2015-01-07 DIAGNOSIS — Z79899 Other long term (current) drug therapy: Secondary | ICD-10-CM | POA: Insufficient documentation

## 2015-01-07 DIAGNOSIS — R079 Chest pain, unspecified: Secondary | ICD-10-CM | POA: Diagnosis present

## 2015-01-07 DIAGNOSIS — N400 Enlarged prostate without lower urinary tract symptoms: Secondary | ICD-10-CM | POA: Insufficient documentation

## 2015-01-07 DIAGNOSIS — I25119 Atherosclerotic heart disease of native coronary artery with unspecified angina pectoris: Secondary | ICD-10-CM | POA: Insufficient documentation

## 2015-01-07 DIAGNOSIS — H269 Unspecified cataract: Secondary | ICD-10-CM | POA: Insufficient documentation

## 2015-01-07 DIAGNOSIS — Z800 Family history of malignant neoplasm of digestive organs: Secondary | ICD-10-CM | POA: Insufficient documentation

## 2015-01-07 DIAGNOSIS — Z820 Family history of epilepsy and other diseases of the nervous system: Secondary | ICD-10-CM | POA: Insufficient documentation

## 2015-01-07 DIAGNOSIS — Z8249 Family history of ischemic heart disease and other diseases of the circulatory system: Secondary | ICD-10-CM | POA: Insufficient documentation

## 2015-01-07 DIAGNOSIS — I208 Other forms of angina pectoris: Secondary | ICD-10-CM

## 2015-01-07 DIAGNOSIS — I10 Essential (primary) hypertension: Secondary | ICD-10-CM | POA: Diagnosis not present

## 2015-01-07 DIAGNOSIS — Z841 Family history of disorders of kidney and ureter: Secondary | ICD-10-CM | POA: Insufficient documentation

## 2015-01-07 DIAGNOSIS — Z882 Allergy status to sulfonamides status: Secondary | ICD-10-CM | POA: Insufficient documentation

## 2015-01-07 HISTORY — PX: CARDIAC CATHETERIZATION: SHX172

## 2015-01-07 SURGERY — LEFT HEART CATH
Anesthesia: Moderate Sedation

## 2015-01-07 MED ORDER — SODIUM CHLORIDE 0.9 % IV SOLN: INTRAVENOUS | Status: DC

## 2015-01-07 MED ORDER — HEPARIN (PORCINE) IN NACL 2-0.9 UNIT/ML-% IJ SOLN
INTRAMUSCULAR | Status: AC
  Filled 2015-01-07: qty 500

## 2015-01-07 MED ORDER — FENTANYL CITRATE (PF) 100 MCG/2ML IJ SOLN
INTRAMUSCULAR | Status: AC
  Filled 2015-01-07: qty 2

## 2015-01-07 MED ORDER — SODIUM CHLORIDE 0.9 % IJ SOLN: 3.0000 mL | INTRAMUSCULAR | Status: DC | PRN

## 2015-01-07 MED ORDER — FENTANYL CITRATE (PF) 100 MCG/2ML IJ SOLN
INTRAMUSCULAR | Status: DC | PRN
  Administered 2015-01-07: 50 ug via INTRAVENOUS

## 2015-01-07 MED ORDER — IOHEXOL 300 MG/ML  SOLN
INTRAMUSCULAR | Status: DC | PRN
  Administered 2015-01-07: 100 mL via INTRA_ARTERIAL
  Administered 2015-01-07 (×2): 30 mL via INTRA_ARTERIAL

## 2015-01-07 MED ORDER — MIDAZOLAM HCL 2 MG/2ML IJ SOLN
INTRAMUSCULAR | Status: DC | PRN
  Administered 2015-01-07 (×2): 1 mg via INTRAVENOUS

## 2015-01-07 MED ORDER — SODIUM CHLORIDE 0.9 % IV SOLN: 250.0000 mL | INTRAVENOUS | Status: DC | PRN

## 2015-01-07 MED ORDER — SODIUM CHLORIDE 0.9 % WEIGHT BASED INFUSION: 3.0000 mL/kg/h | INTRAVENOUS | Status: DC

## 2015-01-07 MED ORDER — MIDAZOLAM HCL 2 MG/2ML IJ SOLN
INTRAMUSCULAR | Status: AC
  Filled 2015-01-07: qty 2

## 2015-01-07 MED ORDER — SODIUM CHLORIDE 0.9 % IJ SOLN: 3.0000 mL | Freq: Two times a day (BID) | INTRAMUSCULAR | Status: DC

## 2015-01-07 SURGICAL SUPPLY — 10 items
CATH INFINITI 5 FR 3DRC (CATHETERS) IMPLANT
CATH INFINITI 5FR ANG PIGTAIL (CATHETERS) ×3 IMPLANT
CATH INFINITI 5FR JL4 (CATHETERS) ×3 IMPLANT
CATH INFINITI JR4 5F (CATHETERS) ×3 IMPLANT
DEVICE CLOSURE MYNXGRIP 5F (Vascular Products) ×3 IMPLANT
KIT MANI 3VAL PERCEP (MISCELLANEOUS) ×3 IMPLANT
NEEDLE PERC 18GX7CM (NEEDLE) ×3 IMPLANT
PACK CARDIAC CATH (CUSTOM PROCEDURE TRAY) ×3 IMPLANT
SHEATH PINNACLE 5F 10CM (SHEATH) ×3 IMPLANT
WIRE EMERALD 3MM-J .035X150CM (WIRE) ×3 IMPLANT

## 2015-01-07 NOTE — Discharge Instructions (Signed)

## 2015-01-12 ENCOUNTER — Encounter: Payer: Self-pay | Admitting: *Deleted

## 2015-01-13 ENCOUNTER — Institutional Professional Consult (permissible substitution) (INDEPENDENT_AMBULATORY_CARE_PROVIDER_SITE_OTHER): Payer: Medicare Other | Admitting: Cardiothoracic Surgery

## 2015-01-13 ENCOUNTER — Encounter: Payer: Self-pay | Admitting: Cardiothoracic Surgery

## 2015-01-13 VITALS — BP 158/90 | HR 68 | Resp 20 | Ht 74.0 in | Wt 225.0 lb

## 2015-01-13 DIAGNOSIS — I2510 Atherosclerotic heart disease of native coronary artery without angina pectoris: Secondary | ICD-10-CM | POA: Diagnosis not present

## 2015-01-13 NOTE — Patient Instructions (Signed)
Coronary Artery Bypass Grafting Coronary artery bypass grafting (CABG) is a procedure done to bypass or fix arteries of the heart (coronary arteries) that have become narrow or blocked. This narrowing is usually the result of plaque that has built up in the walls of the vessels. The coronary arteries supply the heart with the oxygen and nutrients it needs to pump blood to your body. In the CABG procedure, a section of blood vessel from another part of the body (usually the leg, arm, or chest wall) is removed and then inserted where it will allow blood to bypass the damaged part of the coronary artery.  LET [facility] CARE PROVIDER KNOW ABOUT:  Any allergies you have.   All medicines you are taking, including blood thinners, vitamins, herbs, eye drops, creams, and over-the-counter medicines.   Use of steroids (by mouth or creams).   Previous problems you or members of your family have had with the use of anesthetics.   Any blood disorders you have.   Previous surgeries you have had.   Medical conditions you have.  RISKS AND COMPLICATIONS Generally, this is a safe procedure. However, problems can occur and include:   Blood loss.   Stroke.   Infection.   Pain at the surgical site.   Heart attack during or after surgery.   Kidney failure.  BEFORE THE PROCEDURE  Take medicines only as directed by your health care provider. You may be asked to start new medicines and stop taking others. Do not stop medicines or adjust dosages on your own.  Do not eat or drink anything after midnight the night before the procedure or as directed by your health care provider. Ask your health care provider if it is okay to take a sip of water with any needed medicines. PROCEDURE The surgeon may use either an open technique or a minimally invasive technique for this surgery. Traditional open surgery  You will be given medicine to make you sleep through the procedure (general  anesthetic).  Once you are asleep, a cut (incision) will be made down the front of the chest through the breastbone (sternum). The sternum will be spread open so your heart can be seen.  You will then be placed on a heart-lung bypass machine. This machine will provide oxygen to your blood while the heart is undergoing surgery.  Your heart will then be temporarily stopped so that the surgeon can do the next steps.  A section of vein will likely be taken from your leg and used to bypass the blocked arteries of your heart. Sometimes parts of an artery from inside your chest wall or from your arm will be used, either alone or in combination with leg veins.  When the bypasses are done, you will be taken off the machine.  Your heart will be restarted and will take over again normally.  The sac around the heart will be closed.  Your chest will then be closed with stitches or staples.  Tubes will remain in your chest and will be connected to a suction device in order to help drain fluid and reinflate the lungs. Minimally invasive surgery This technique is done from an incision over your left chest area. If appropriate, your surgeon may not have to slow or stop your heart. If your condition allows for this procedure, there will often be less blood loss, less pain, a shorter hospital stay, and faster recovery compared to traditional open surgery. AFTER THE PROCEDURE  You will be taken to  a recovery area to be monitored.  You may wake up with a tube in your throat to help your breathing. You may be connected to a breathing machine. You will not be able to talk while the tube is in place. The tube will be taken out as soon as it is safe to do so.  You will be groggy and may have some pain. You will be given pain medicine to help control the pain. Document Released: 04/12/2005 Document Revised: 11/17/2013 Document Reviewed: 12/10/2012 [facility] Patient Information 2015 [place], [place]. This  information is not intended to replace advice given to you by your health care provider. Make sure you discuss any questions you have with your health care provider. Coronary Artery Bypass Grafting, Care After Refer to this sheet in the next few weeks. These instructions provide you with information on caring for yourself after your procedure. Your health care provider may also give you more specific instructions. Your treatment has been planned according to current medical practices, but problems sometimes occur. Call your health care provider if you have any problems or questions after your procedure. WHAT TO EXPECT AFTER THE PROCEDURE Recovery from surgery will be different for everyone. Some people feel well after 3 or 4 weeks, while for others it takes longer. After your procedure, it is typical to have the following:  Nausea and a lack of appetite.   Constipation.  Weakness and fatigue.   Depression or irritability.   Pain or discomfort at your incision site. HOME CARE INSTRUCTIONS  Take medicines only as directed by your health care provider. Do not stop taking medicines or start any new medicines without first checking with your health care provider.  Take your pulse as directed by your health care provider.  Perform deep breathing as directed by your health care provider. If you were given a device called an incentive spirometer, use it to practice deep breathing several times a day. Support your chest with a pillow or your arms when you take deep breaths or cough.  Keep incision areas clean, dry, and protected. Remove or change any bandages (dressings) only as directed by your health care provider. You may have skin adhesive strips over the incision areas. Do not take the strips off. They will fall off on their own.  Check incision areas daily for any swelling, redness, or drainage.  If incisions were made in your legs, do the following:  Avoid crossing your legs.   Avoid  sitting for long periods of time. Change positions every 30 minutes.   Elevate your legs when you are sitting.  Wear compression stockings as directed by your health care provider. These stockings help keep blood clots from forming in your legs.  Take showers once your health care provider approves. Until then, only take sponge baths. Pat incisions dry. Do not rub incisions with a washcloth or towel. Do not take baths, swim, or use a hot tub until your health care provider approves.  Eat foods that are high in fiber, such as raw fruits and vegetables, whole grains, beans, and nuts. Meats should be lean cut. Avoid canned, processed, and fried foods.  Drink enough fluid to keep your urine clear or pale yellow.  Weigh yourself every day. This helps identify if you are retaining fluid that may make your heart and lungs work harder.  Rest and limit activity as directed by your health care provider. You may be instructed to:  Stop any activity at once if you have chest  pain, shortness of breath, irregular heartbeats, or dizziness. Get help right away if you have any of these symptoms.  Move around frequently for short periods or take short walks as directed by your health care provider. Increase your activities gradually. You may need physical therapy or cardiac rehabilitation to help strengthen your muscles and build your endurance.  Avoid lifting, pushing, or pulling anything heavier than 10 lb (4.5 kg) for at least 6 weeks after surgery.  Do not drive until your health care provider approves.  Ask your health care provider when you may return to work.  Ask your health care provider when you may resume sexual activity.  Keep all follow-up visits as directed by your health care provider. This is important. SEEK MEDICAL CARE IF:  You have swelling, redness, increasing pain, or drainage at the site of an incision.  You have a fever.  You have swelling in your ankles or legs.  You have  pain in your legs.   You gain 2 or more pounds (0.9 kg) a day.  You are nauseous or vomit.  You have diarrhea. SEEK IMMEDIATE MEDICAL CARE IF:  You have chest pain that goes to your jaw or arms.  You have shortness of breath.   You have a fast or irregular heartbeat.   You notice a "clicking" in your breastbone (sternum) when you move.   You have numbness or weakness in your arms or legs.  You feel dizzy or light-headed.  MAKE SURE YOU:  Understand these instructions.  Will watch your condition.  Will get help right away if you are not doing well or get worse. Document Released: 01/20/2005 Document Revised: 11/17/2013 Document Reviewed: 12/10/2012 [facility] Patient Information 2015 [place], [place]. This information is not intended to replace advice given to you by your health care provider. Make sure you discuss any questions you have with your health care provider.

## 2015-01-13 NOTE — Progress Notes (Signed)
[address]       [place] [address]             [phone]                    Bradley [name] Medical Record #[id number] Date of Birth: May 15, 1941  Referring: Sofie Hartigan, MD Primary Care: [facility], Chrissie Noa, MD  Chief Complaint:    Chief Complaint  Patient presents with  . Coronary Artery Disease    Surgical eval, Cardiac Cath 01/07/15, ECHO @ [place] in [place] back in May 2016    History of Present Illness:    Bradley [name] 74 y.o. male is seen in the office  today for for consideration of coronary artery bypass grafting. The patient had a previous history of known coronary artery disease having had a angioplasty and atherectomy done in 1999 at [facility]. He done well over the years but noted over the past several months increasing episodes of substernal discomfort radiating to his left arm with exertion. He notes that that exertion brings the discomfort on, he denies rest discomfort. The symptoms have been progressing over the past several months, he now become symptomatic if he walks a quarter of a mile 6 months ago he was able to walk 2-3 miles without difficulty.      Current Activity/ Functional Status:  Patient is independent with mobility/ambulation, transfers, ADL's, IADL's.   Zubrod Score: At the time of surgery this patient's most appropriate activity status/level should be described as: [x]     0    Normal activity, no symptoms []     1    Restricted in physical strenuous activity but ambulatory, able to do out light work []     2    Ambulatory and capable of self care, unable to do work activities, up and about               >50 % of waking hours                              []     3    Only limited self care, in bed greater than 50% of waking hours []     4    Completely disabled, no self care, confined to bed or chair []     5    Moribund   Past Medical History  Diagnosis Date  . Coronary artery disease   . Meningioma     stable,  followed by yearly MRIs    Past Surgical History  Procedure Laterality Date  . Cardiac surgery      coronary angioplasty  . Cholecystectomy    . Hiatal hernia repair N/A   . Cardiac catheterization N/A 01/07/2015    Procedure: Left Heart Cath;  Surgeon: Corey Skains, MD;  Location: [facility];  Service: Cardiovascular;  Laterality: N/A;  . Colonoscopy  11/03/08, 03/09/14  . Esophagogastroduodenoscopy  02/19/13  . Esophagus surgery      Family History  Problem Relation Age of Onset  . Parkinsonism Mother   . Kidney disease Father   . Cancer Father     prostate  . Heart disease Father   . Cancer Paternal Uncle     esophageal  . Cancer Paternal Uncle     History   Social History  . Marital Status: Married    Spouse Name: N/A  .  Number of Children: N/A  . Years of Education: N/A   Occupational History  . Not on file.   Social History Main Topics  . Smoking status: Never Smoker   . Smokeless tobacco: Not on file  . Alcohol Use: No  . Drug Use: No  . Sexual Activity: Not on file   Other Topics Concern  . Not on file   Social History Narrative    History  Smoking status  . Never Smoker   Smokeless tobacco  . Not on file    History  Alcohol Use No     Allergies  Allergen Reactions  . Oxycodone Other (See Comments)    hypotention  . Sulfa Antibiotics Rash    Current Outpatient Prescriptions  Medication Sig Dispense Refill  . amLODipine (NORVASC) 2.5 MG tablet Take 2.5 mg by mouth daily.    Marland Kitchen aspirin 81 MG tablet Take 81 mg by mouth daily.    Marland Kitchen atorvastatin (LIPITOR) 20 MG tablet Take 20 mg by mouth daily.    . cetirizine (ZYRTEC) 5 MG tablet Take 5 mg by mouth daily.    . cyanocobalamin 100 MCG tablet Take 100 mcg by mouth daily.    Marland Kitchen dexlansoprazole (DEXILANT) 60 MG capsule Take 60 mg by mouth daily.    Marland Kitchen dutasteride (AVODART) 0.5 MG capsule Take 0.5 mg by mouth daily.    Marland Kitchen latanoprost (XALATAN) 0.005 % ophthalmic solution Place 1 drop  into both eyes at bedtime.    . nitroGLYCERIN (NITROSTAT) 0.4 MG SL tablet Place 0.4 mg under the tongue every 5 (five) minutes as needed for chest pain.    Marland Kitchen trimethoprim-polymyxin b (POLYTRIM) ophthalmic solution Place 1 drop into both eyes every morning.      No current facility-administered medications for this visit.      Review of Systems:     Cardiac Review of Systems: Y or N  Chest Pain [  y  ]  Resting SOB [ n  ] Exertional SOB  [ n ]  Orthopnea [ n ]   Pedal Edema [ n  ]    Palpitations [n  ] Syncope  [ n ]   Presyncope [  n ]  General Review of Systems: [Y] = yes [  ]=no Constitional: recent weight change [n  ];  Wt loss over the last 3 months [   ] anorexia [  ]; fatigue [  ]; nausea [  ]; night sweats [  ]; fever [  ]; or chills [  ];          Dental: poor dentition[  ]; Last Dentist visit:   Eye : blurred vision [  ]; diplopia [   ]; vision changes [  ];  Amaurosis fugax[ n ]; Resp: cough [  ];  wheezing[  ];  hemoptysis[  ]; shortness of breath[  ]; paroxysmal nocturnal dyspnea[  ]; dyspnea on exertion[ n ]; or orthopnea[  ];  GI:  gallstones[  ], vomiting[  ];  dysphagia[  ]; melena[  ];  hematochezia [  ]; heartburn[  ];   Hx of  Colonoscopy[  ]; GU: kidney stones [  ]; hematuria[  ];   dysuria [  ];  nocturia[  ];  history of     obstruction [ n ]; urinary frequency [  ]             Skin: rash, swelling[  ];, hair loss[  ];  peripheral edema[  ];  or itching[  ]; Musculosketetal: myalgias[  ];  joint swelling[  ];  joint erythema[  ];  joint pain[ y ];  back pain[  ];  Heme/Lymph: bruising[  ];  bleeding[  ];  anemia[  ];  Neuro: TIA[  ];  headaches[  ];  stroke[  ];  vertigo[  ];  seizures[  ];   paresthesias[n  ];  difficulty walking[n  ];  Psych:depression[  ]; anxiety[  ];  Endocrine: diabetes[ n ];  thyroid dysfunction[ n ];  Immunizations: Flu up to date [ y ]; Pneumococcal up to date [ y ];  Other:  Physical Exam: BP 158/90 mmHg  Pulse 68  Resp 20  Ht 6\' 2"   (1.88 m)  Wt 225 lb (102.059 kg)  BMI 28.88 kg/m2  SpO2 96%  PHYSICAL EXAMINATION: General appearance: alert, cooperative and appears stated age Head: Normocephalic, without obvious abnormality, atraumatic Neck: no adenopathy, no carotid bruit, no JVD, supple, symmetrical, trachea midline and thyroid not enlarged, symmetric, no tenderness/mass/nodules Lymph nodes: Cervical, supraclavicular, and axillary nodes normal. Resp: clear to auscultation bilaterally Back: symmetric, no curvature. ROM normal. No CVA tenderness. 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   I have independently reviewed the above radiologic studies.  Recent Lab Findings: Lab Results  Component Value Date   WBC 6.5 09/25/2012   HGB 14.3 09/25/2012   HCT 42.4 09/25/2012   PLT 214 09/25/2012   GLUCOSE 89 09/25/2012   ALT 31 10/11/2011   AST 28 10/11/2011   NA 140 09/25/2012   K 4.3 09/25/2012   CL 109* 09/25/2012   CREATININE 1.10 09/25/2012   BUN 26* 09/25/2012   CO2 24 09/25/2012   INR 1.0 08/25/2011   CATH: [place]  01/07/2015  Ost LAD to Prox LAD lesion, 85% stenosed.  Dist LAD lesion, 20% stenosed.  Prox Cx lesion, 20% stenosed.  Prox RCA to Mid RCA lesion, 99% stenosed.  Mid RCA lesion, 99% stenosed.  Dist RCA lesion, 40% stenosed.  Assessment The patient has had progressive canadian class 3 anginal symptoms with a high probability stress test with risk  factors including high blood pressure and high cholesterol.  normal left ventricular function with ejection fraction of 60%  severe 2 vessel coronary artery disease   There is significant stenosis of left anterior descending and rca  Plan Continue medical management of CAD risk factors, Consider consultation for CABG and Additional medications for management of angina     Coronary Findings    Dominance: Right   Left Anterior Descending   . Ost LAD to Prox LAD lesion, 85% stenosed.   Jorene Minors LAD lesion, 20% stenosed.     Left Circumflex   . Prox Cx lesion, 20% stenosed.     Right Coronary Artery  The vessel is large is angiographically normal.   . Prox RCA to Mid RCA lesion, 99% stenosed.   . Mid RCA lesion, 99% stenosed.   . Dist RCA lesion, 40% stenosed.   Marland Kitchen  Acute Marginal Branch   Acute Mrg filled by collaterals from Acute Mrg. Acute Mrg filled by collaterals from Acute Mrg.       ECHO done at [facility] results are trying to be located   Assessment / Plan:   Symptomatic CAD with proximal LAD lesion I recommended proceeding with coronary artery bypass grafting in the very near future. Risks and options of treatment of his coronary artery disease have been discussed with the patient and his wife in detail. I recommended proceeding with coronary artery bypass grafting because of the proximal LAD lesion and a total occlusion of the right. Because of the patient's varicosities we will obtain preoperative vein mapping. The risks of surgery including death infection stroke myocardial infarction bleeding blood transfusion were all discussed in detail. Until that time of surgery I have recommended to the patient that he not drive in markedly limit his physical activity, should he have recurrent chest pain should call 911 immediately. We tentatively plan surgery for July 5.      I  spent 45 minutes counseling the patient face to face and 50% or more the  time was spent in counseling and  coordination of care. The total time spent in the appointment was 60 minutes.  Grace Isaac MD      [address] [place] [address] Office [phone]   Beeper [phone]  01/13/2015 4:41 PM

## 2015-01-14 ENCOUNTER — Other Ambulatory Visit: Payer: Self-pay | Admitting: *Deleted

## 2015-01-14 DIAGNOSIS — I2510 Atherosclerotic heart disease of native coronary artery without angina pectoris: Secondary | ICD-10-CM

## 2015-01-14 NOTE — Pre-Procedure Instructions (Addendum)
    Bradley [name]  01/14/2015      [facility] [place], [place] - [place] [address] [place] [address] Phone: [phone] Fax: [phone]    Your procedure is scheduled on 01/19/15.  Report to [facility] Admitting at 530 A.M.  Call this number if you have problems the morning of surgery:  [phone]   Remember:  Do not eat food or drink liquids after midnight.  Take these medicines the morning of surgery with A SIP OF WATER norvasc,zyrtec,eye drops,aspirin, dexilant, and avodart.               STOP all NSAIDs (Nonsteroidal Anti-inflammatories), Vitamins, BC/Goody's Powders, and Herbal Supplements 7 days prior to surgery.    Do not wear jewelry, make-up or nail polish.  Do not wear lotions, powders, or perfumes.  You may wear deodorant.  Do not shave 48 hours prior to surgery.  Men may shave face and neck.  Do not bring valuables to the hospital.  [facility] is not responsible for any belongings or valuables.  Contacts, dentures or bridgework may not be worn into surgery.  Leave your suitcase in the car.  After surgery it may be brought to your room.  For patients admitted to the hospital, discharge time will be determined by your treatment team.  Patients discharged the day of surgery will not be allowed to drive home.   Name and phone number of your driver:    Special instructions:    Please read over the following fact sheets that you were given. Pain Booklet, Coughing and Deep Breathing, Blood Transfusion Information, MRSA Information and Surgical Site Infection Prevention

## 2015-01-15 ENCOUNTER — Encounter (HOSPITAL_COMMUNITY)
Admission: RE | Admit: 2015-01-15 | Discharge: 2015-01-15 | Disposition: A | Discharge status: Home / Self Care | Payer: Medicare Other | Source: Ambulatory Visit | Attending: Cardiothoracic Surgery | Admitting: Cardiothoracic Surgery

## 2015-01-15 ENCOUNTER — Ambulatory Visit (HOSPITAL_COMMUNITY)
Admission: RE | Admit: 2015-01-15 | Discharge: 2015-01-15 | Disposition: A | Discharge status: Home / Self Care | Payer: Medicare Other | Source: Ambulatory Visit | Attending: Cardiothoracic Surgery | Admitting: Cardiothoracic Surgery

## 2015-01-15 ENCOUNTER — Encounter (HOSPITAL_COMMUNITY): Payer: Self-pay

## 2015-01-15 VITALS — BP 126/65 | HR 56 | Temp 98.2°F | Resp 20 | Ht 75.0 in | Wt 226.8 lb

## 2015-01-15 DIAGNOSIS — Z01818 Encounter for other preprocedural examination: Secondary | ICD-10-CM | POA: Insufficient documentation

## 2015-01-15 DIAGNOSIS — I4510 Unspecified right bundle-branch block: Secondary | ICD-10-CM | POA: Insufficient documentation

## 2015-01-15 DIAGNOSIS — I771 Stricture of artery: Secondary | ICD-10-CM | POA: Insufficient documentation

## 2015-01-15 DIAGNOSIS — Z0183 Encounter for blood typing: Secondary | ICD-10-CM | POA: Insufficient documentation

## 2015-01-15 DIAGNOSIS — R001 Bradycardia, unspecified: Secondary | ICD-10-CM | POA: Insufficient documentation

## 2015-01-15 DIAGNOSIS — J449 Chronic obstructive pulmonary disease, unspecified: Secondary | ICD-10-CM | POA: Diagnosis not present

## 2015-01-15 DIAGNOSIS — M5134 Other intervertebral disc degeneration, thoracic region: Secondary | ICD-10-CM | POA: Insufficient documentation

## 2015-01-15 DIAGNOSIS — I2510 Atherosclerotic heart disease of native coronary artery without angina pectoris: Secondary | ICD-10-CM

## 2015-01-15 DIAGNOSIS — Z01812 Encounter for preprocedural laboratory examination: Secondary | ICD-10-CM | POA: Insufficient documentation

## 2015-01-15 HISTORY — DX: Personal history of other diseases of the digestive system: Z87.19

## 2015-01-15 HISTORY — DX: Gastro-esophageal reflux disease without esophagitis: K21.9

## 2015-01-15 LAB — COMPREHENSIVE METABOLIC PANEL
ALT: 26 U/L (ref 17–63)
AST: 21 U/L (ref 15–41)
Albumin: 3.9 g/dL (ref 3.5–5.0)
Alkaline Phosphatase: 88 U/L (ref 38–126)
Anion gap: 7 (ref 5–15)
BUN: 16 mg/dL (ref 6–20)
CO2: 24 mmol/L (ref 22–32)
Calcium: 9.6 mg/dL (ref 8.9–10.3)
Chloride: 108 mmol/L (ref 101–111)
Creatinine, Ser: 1.02 mg/dL (ref 0.61–1.24)
GFR calc Af Amer: >60 mL/min (ref >60)
GFR calc non Af Amer: >60 mL/min (ref >60)
Glucose, Bld: 108 mg/dL — ABNORMAL HIGH (ref 65–99)
Potassium: 4.5 mmol/L (ref 3.5–5.1)
Sodium: 139 mmol/L (ref 135–145)
Total Bilirubin: 0.6 mg/dL (ref 0.3–1.2)
Total Protein: 7.2 g/dL (ref 6.5–8.1)

## 2015-01-15 LAB — PULMONARY FUNCTION TEST
DL/VA % pred: 90 %
DL/VA: 4.37 ml/min/mmHg/L
DLCO unc % pred: 71 %
DLCO unc: 27.76 ml/min/mmHg
FEF 25-75 Post: 2.37 L/sec
FEF 25-75 Pre: 2.26 L/sec
FEF2575-%Change-Post: 4 %
FEF2575-%Pred-Post: 87 %
FEF2575-%Pred-Pre: 83 %
FEV1-%Change-Post: -1 %
FEV1-%Pred-Post: 86 %
FEV1-%Pred-Pre: 88 %
FEV1-Post: 3.23 L
FEV1-Pre: 3.29 L
FEV1FVC-%Change-Post: -3 %
FEV1FVC-%Pred-Pre: 103 %
FEV6-%Change-Post: -1 %
FEV6-%Pred-Post: 87 %
FEV6-%Pred-Pre: 89 %
FEV6-Post: 4.24 L
FEV6-Pre: 4.29 L
FEV6FVC-%Change-Post: -3 %
FEV6FVC-%Pred-Post: 100 %
FEV6FVC-%Pred-Pre: 103 %
FVC-%Change-Post: 2 %
FVC-%Pred-Post: 87 %
FVC-%Pred-Pre: 85 %
FVC-Post: 4.47 L
FVC-Pre: 4.37 L
Post FEV1/FVC ratio: 72 %
Post FEV6/FVC ratio: 95 %
Pre FEV1/FVC ratio: 75 %
Pre FEV6/FVC Ratio: 98 %
RV % pred: 192 %
RV: 5.44 L
TLC % pred: 123 %
TLC: 9.98 L

## 2015-01-15 LAB — CBC
HCT: 44.2 % (ref 39.0–52.0)
Hemoglobin: 15.1 g/dL (ref 13.0–17.0)
MCH: 32.3 pg (ref 26.0–34.0)
MCHC: 34.2 g/dL (ref 30.0–36.0)
MCV: 94.6 fL (ref 78.0–100.0)
Platelets: 221 10*3/uL (ref 150–400)
RBC: 4.67 MIL/uL (ref 4.22–5.81)
RDW: 13.6 % (ref 11.5–15.5)
WBC: 7.6 10*3/uL (ref 4.0–10.5)

## 2015-01-15 LAB — URINALYSIS, ROUTINE W REFLEX MICROSCOPIC
Bilirubin Urine: NEGATIVE
Glucose, UA: NEGATIVE mg/dL
Hgb urine dipstick: NEGATIVE
Ketones, ur: NEGATIVE mg/dL
Leukocytes, UA: NEGATIVE
Nitrite: NEGATIVE
Protein, ur: NEGATIVE mg/dL
Specific Gravity, Urine: 1.016 (ref 1.005–1.030)
Urobilinogen, UA: 0.2 mg/dL (ref 0.0–1.0)
pH: 6.5 (ref 5.0–8.0)

## 2015-01-15 LAB — BLOOD GAS, ARTERIAL
Acid-base deficit: 0.1 mmol/L (ref 0.0–2.0)
Bicarbonate: 23.9 mEq/L (ref 20.0–24.0)
Drawn by: 428831
FIO2: 0.21 %
O2 Saturation: 97.5 %
Patient temperature: 98.6
TCO2: 25.1 mmol/L (ref 0–100)
pCO2 arterial: 37.9 mmHg (ref 35.0–45.0)
pH, Arterial: 7.416 (ref 7.350–7.450)
pO2, Arterial: 105 mmHg — ABNORMAL HIGH (ref 80.0–100.0)

## 2015-01-15 LAB — SURGICAL PCR SCREEN
MRSA, PCR: NEGATIVE
Staphylococcus aureus: NEGATIVE

## 2015-01-15 LAB — PROTIME-INR
INR: 1.08 (ref 0.00–1.49)
Prothrombin Time: 14.2 seconds (ref 11.6–15.2)

## 2015-01-15 LAB — TYPE AND SCREEN
ABO/RH(D): A POS
Antibody Screen: NEGATIVE

## 2015-01-15 LAB — ABO/RH: ABO/RH(D): A POS

## 2015-01-15 LAB — APTT: aPTT: 35 seconds (ref 24–37)

## 2015-01-15 MED ORDER — CHLORHEXIDINE GLUCONATE 4 % EX LIQD
30.0000 mL | CUTANEOUS | Status: DC
Start: 2015-01-15 — End: 2015-01-16

## 2015-01-15 MED ORDER — ALBUTEROL SULFATE (2.5 MG/3ML) 0.083% IN NEBU
2.5000 mg | INHALATION_SOLUTION | Freq: Once | RESPIRATORY_TRACT | Status: AC
  Administered 2015-01-15: 2.5 mg via RESPIRATORY_TRACT

## 2015-01-15 NOTE — Progress Notes (Signed)
Right Lower Extremity Vein Map    Right Great Saphenous Vein   Segment Diameter Comment  1. Origin 5.39mm   2. High Thigh 16mm   3. Mid Thigh 3.27mm   4. Low Thigh 3.67mm   5. At Knee 46mm   6. High Calf 3.47mm   7. Low Calf 35mm   8. Ankle 2.70mm       Left Lower Extremity Vein Map    Left Great Saphenous Vein   Segment Diameter Comment  1. Origin 34mm   2. High Thigh 3.37mm   3. Mid Thigh 2.36mm branch  2.6 mm   5. At Knee mm   6. High Calf 2.35mm   7. Low Calf 3.3mm Multiple branches  8. Ankle 2.74mm

## 2015-01-15 NOTE — Progress Notes (Signed)
VASCULAR LAB PRELIMINARY  PRELIMINARY  PRELIMINARY  PRELIMINARY  Pre-op Cardiac Surgery  Carotid Findings:  Bilateral:  1-39% ICA stenosis.  Vertebral artery flow is antegrade.     Upper Extremity Right Left  Brachial Pressures 136 Triphasic 138 Triphasic  Radial Waveforms Triphasic Triphasic  Ulnar Waveforms Triphasic Triphasic  Palmar Arch (Allen's Test) Abnormal Normal   Findings:  Doppler waveforms on the right obliterate with radial compression and remain normal with ulnar compression. Left Doppler waveforms remain normal with both radial and ulnar compression   Lower  Extremity Right Left  Dorsalis Pedis    Anterior Tibial    Posterior Tibial    Ankle/Brachial Indices      Findings: Pedal pulses are palpable bilaterally at rest.    Candyce Gambino, RVS 01/15/2015, 12:42 PM

## 2015-01-16 LAB — HEMOGLOBIN A1C
Hgb A1c MFr Bld: 5.9 % — ABNORMAL HIGH (ref 4.8–5.6)
Mean Plasma Glucose: 123 mg/dL

## 2015-01-18 MED ORDER — NITROGLYCERIN IN D5W 200-5 MCG/ML-% IV SOLN
2.0000 ug/min | INTRAVENOUS | Status: AC
  Administered 2015-01-19: 5 ug/min via INTRAVENOUS
  Filled 2015-01-18 (×2): qty 250

## 2015-01-18 MED ORDER — DEXTROSE 5 % IV SOLN
0.0000 ug/min | INTRAVENOUS | Status: DC
  Filled 2015-01-18 (×2): qty 4

## 2015-01-18 MED ORDER — VANCOMYCIN HCL 10 G IV SOLR
1500.0000 mg | INTRAVENOUS | Status: AC
  Administered 2015-01-19: 1500 mg via INTRAVENOUS
  Filled 2015-01-18 (×2): qty 1500

## 2015-01-18 MED ORDER — MAGNESIUM SULFATE 50 % IJ SOLN
40.0000 meq | INTRAMUSCULAR | Status: DC
  Filled 2015-01-18 (×2): qty 10

## 2015-01-18 MED ORDER — PLASMA-LYTE 148 IV SOLN
INTRAVENOUS | Status: DC
  Filled 2015-01-18 (×2): qty 2.5

## 2015-01-18 MED ORDER — METOPROLOL TARTRATE 12.5 MG HALF TABLET: 12.5000 mg | ORAL_TABLET | Freq: Once | ORAL | Status: DC

## 2015-01-18 MED ORDER — DEXTROSE 5 % IV SOLN
1.5000 g | INTRAVENOUS | Status: AC
  Administered 2015-01-19: 750 g via INTRAVENOUS
  Administered 2015-01-19: 1.5 g via INTRAVENOUS
  Filled 2015-01-18 (×2): qty 1.5

## 2015-01-18 MED ORDER — SODIUM CHLORIDE 0.9 % IV SOLN
INTRAVENOUS | Status: DC
  Filled 2015-01-18 (×2): qty 30

## 2015-01-18 MED ORDER — DEXTROSE 5 % IV SOLN
750.0000 mg | INTRAVENOUS | Status: DC
  Filled 2015-01-18 (×2): qty 750

## 2015-01-18 MED ORDER — DEXMEDETOMIDINE HCL IN NACL 400 MCG/100ML IV SOLN
0.1000 ug/kg/h | INTRAVENOUS | Status: AC
  Administered 2015-01-19: .2 ug/kg/h via INTRAVENOUS
  Filled 2015-01-18 (×2): qty 100

## 2015-01-18 MED ORDER — PHENYLEPHRINE HCL 10 MG/ML IJ SOLN
30.0000 ug/min | INTRAVENOUS | Status: AC
  Administered 2015-01-19: 25 ug/min via INTRAVENOUS
  Filled 2015-01-18 (×2): qty 2

## 2015-01-18 MED ORDER — DOPAMINE-DEXTROSE 3.2-5 MG/ML-% IV SOLN
0.0000 ug/kg/min | INTRAVENOUS | Status: DC
  Filled 2015-01-18: qty 250

## 2015-01-18 MED ORDER — POTASSIUM CHLORIDE 2 MEQ/ML IV SOLN
80.0000 meq | INTRAVENOUS | Status: DC
  Filled 2015-01-18 (×2): qty 40

## 2015-01-18 MED ORDER — SODIUM CHLORIDE 0.9 % IV SOLN
INTRAVENOUS | Status: AC
  Administered 2015-01-19: .9 [IU]/h via INTRAVENOUS
  Filled 2015-01-18 (×2): qty 2.5

## 2015-01-18 MED ORDER — SODIUM CHLORIDE 0.9 % IV SOLN
INTRAVENOUS | Status: AC
  Administered 2015-01-19: 12:00:00 via INTRAVENOUS
  Administered 2015-01-19: 69.8 mL/h via INTRAVENOUS
  Filled 2015-01-18 (×2): qty 40

## 2015-01-19 ENCOUNTER — Inpatient Hospital Stay (HOSPITAL_COMMUNITY): Payer: Medicare Other | Admitting: Anesthesiology

## 2015-01-19 ENCOUNTER — Inpatient Hospital Stay (HOSPITAL_COMMUNITY): Payer: Medicare Other

## 2015-01-19 ENCOUNTER — Inpatient Hospital Stay (HOSPITAL_COMMUNITY)
Admission: RE | Admit: 2015-01-19 | Discharge: 2015-01-30 | DRG: 236 | Disposition: A | Discharge status: Home / Self Care | Payer: Medicare Other | Source: Ambulatory Visit | Attending: Cardiothoracic Surgery | Admitting: Cardiothoracic Surgery

## 2015-01-19 ENCOUNTER — Encounter (HOSPITAL_COMMUNITY)
Admission: RE | Disposition: A | Discharge status: Home / Self Care | Payer: Medicare Other | Source: Ambulatory Visit | Attending: Cardiothoracic Surgery

## 2015-01-19 ENCOUNTER — Encounter (HOSPITAL_COMMUNITY): Payer: Self-pay | Admitting: *Deleted

## 2015-01-19 DIAGNOSIS — I4891 Unspecified atrial fibrillation: Secondary | ICD-10-CM | POA: Diagnosis not present

## 2015-01-19 DIAGNOSIS — Z8249 Family history of ischemic heart disease and other diseases of the circulatory system: Secondary | ICD-10-CM | POA: Diagnosis not present

## 2015-01-19 DIAGNOSIS — N400 Enlarged prostate without lower urinary tract symptoms: Secondary | ICD-10-CM | POA: Diagnosis present

## 2015-01-19 DIAGNOSIS — K649 Unspecified hemorrhoids: Secondary | ICD-10-CM | POA: Diagnosis not present

## 2015-01-19 DIAGNOSIS — K449 Diaphragmatic hernia without obstruction or gangrene: Secondary | ICD-10-CM | POA: Diagnosis present

## 2015-01-19 DIAGNOSIS — I10 Essential (primary) hypertension: Secondary | ICD-10-CM | POA: Diagnosis present

## 2015-01-19 DIAGNOSIS — I8390 Asymptomatic varicose veins of unspecified lower extremity: Secondary | ICD-10-CM | POA: Diagnosis present

## 2015-01-19 DIAGNOSIS — K223 Perforation of esophagus: Secondary | ICD-10-CM

## 2015-01-19 DIAGNOSIS — Z820 Family history of epilepsy and other diseases of the nervous system: Secondary | ICD-10-CM | POA: Diagnosis not present

## 2015-01-19 DIAGNOSIS — I4892 Unspecified atrial flutter: Secondary | ICD-10-CM | POA: Diagnosis not present

## 2015-01-19 DIAGNOSIS — Z9861 Coronary angioplasty status: Secondary | ICD-10-CM

## 2015-01-19 DIAGNOSIS — E785 Hyperlipidemia, unspecified: Secondary | ICD-10-CM | POA: Diagnosis present

## 2015-01-19 DIAGNOSIS — K219 Gastro-esophageal reflux disease without esophagitis: Secondary | ICD-10-CM | POA: Diagnosis present

## 2015-01-19 DIAGNOSIS — I2582 Chronic total occlusion of coronary artery: Secondary | ICD-10-CM | POA: Diagnosis present

## 2015-01-19 DIAGNOSIS — R079 Chest pain, unspecified: Secondary | ICD-10-CM | POA: Diagnosis present

## 2015-01-19 DIAGNOSIS — R319 Hematuria, unspecified: Secondary | ICD-10-CM | POA: Diagnosis not present

## 2015-01-19 DIAGNOSIS — I25110 Atherosclerotic heart disease of native coronary artery with unstable angina pectoris: Principal | ICD-10-CM | POA: Diagnosis present

## 2015-01-19 DIAGNOSIS — N179 Acute kidney failure, unspecified: Secondary | ICD-10-CM | POA: Diagnosis not present

## 2015-01-19 DIAGNOSIS — J9811 Atelectasis: Secondary | ICD-10-CM | POA: Diagnosis not present

## 2015-01-19 DIAGNOSIS — I4510 Unspecified right bundle-branch block: Secondary | ICD-10-CM | POA: Diagnosis present

## 2015-01-19 DIAGNOSIS — D62 Acute posthemorrhagic anemia: Secondary | ICD-10-CM | POA: Diagnosis not present

## 2015-01-19 DIAGNOSIS — D329 Benign neoplasm of meninges, unspecified: Secondary | ICD-10-CM | POA: Diagnosis present

## 2015-01-19 DIAGNOSIS — Z809 Family history of malignant neoplasm, unspecified: Secondary | ICD-10-CM | POA: Diagnosis not present

## 2015-01-19 DIAGNOSIS — I2510 Atherosclerotic heart disease of native coronary artery without angina pectoris: Secondary | ICD-10-CM

## 2015-01-19 DIAGNOSIS — D075 Carcinoma in situ of prostate: Secondary | ICD-10-CM | POA: Diagnosis present

## 2015-01-19 DIAGNOSIS — Z951 Presence of aortocoronary bypass graft: Secondary | ICD-10-CM

## 2015-01-19 DIAGNOSIS — I481 Persistent atrial fibrillation: Secondary | ICD-10-CM | POA: Diagnosis not present

## 2015-01-19 DIAGNOSIS — I959 Hypotension, unspecified: Secondary | ICD-10-CM | POA: Diagnosis not present

## 2015-01-19 HISTORY — DX: Essential (primary) hypertension: I10

## 2015-01-19 HISTORY — DX: Gastrointestinal hemorrhage, unspecified: K92.2

## 2015-01-19 HISTORY — PX: CORONARY ARTERY BYPASS GRAFT: SHX141

## 2015-01-19 HISTORY — DX: Unspecified cataract: H26.9

## 2015-01-19 HISTORY — DX: Migraine, unspecified, not intractable, without status migrainosus: G43.909

## 2015-01-19 HISTORY — PX: TEE WITHOUT CARDIOVERSION: SHX5443

## 2015-01-19 HISTORY — DX: Hyperlipidemia, unspecified: E78.5

## 2015-01-19 HISTORY — DX: Unspecified hemorrhoids: K64.9

## 2015-01-19 HISTORY — PX: ENDOVEIN HARVEST OF GREATER SAPHENOUS VEIN: SHX5059

## 2015-01-19 HISTORY — DX: Gastric ulcer, unspecified as acute or chronic, without hemorrhage or perforation: K25.9

## 2015-01-19 HISTORY — DX: Benign prostatic hyperplasia without lower urinary tract symptoms: N40.0

## 2015-01-19 HISTORY — DX: Unspecified right bundle-branch block: I45.10

## 2015-01-19 LAB — POCT I-STAT, CHEM 8
BUN: 20 mg/dL (ref 6–20)
BUN: 17 mg/dL (ref 6–20)
BUN: 18 mg/dL (ref 6–20)
BUN: 16 mg/dL (ref 6–20)
BUN: 16 mg/dL (ref 6–20)
BUN: 16 mg/dL (ref 6–20)
Calcium, Ion: 1.33 mmol/L — ABNORMAL HIGH (ref 1.13–1.30)
Calcium, Ion: 1.15 mmol/L (ref 1.13–1.30)
Calcium, Ion: 1.3 mmol/L (ref 1.13–1.30)
Calcium, Ion: 1.11 mmol/L — ABNORMAL LOW (ref 1.13–1.30)
Calcium, Ion: 1.13 mmol/L (ref 1.13–1.30)
Calcium, Ion: 1.22 mmol/L (ref 1.13–1.30)
Chloride: 105 mmol/L (ref 101–111)
Chloride: 103 mmol/L (ref 101–111)
Chloride: 99 mmol/L — ABNORMAL LOW (ref 101–111)
Chloride: 101 mmol/L (ref 101–111)
Chloride: 103 mmol/L (ref 101–111)
Chloride: 105 mmol/L (ref 101–111)
Creatinine, Ser: 1.2 mg/dL (ref 0.61–1.24)
Creatinine, Ser: 1 mg/dL (ref 0.61–1.24)
Creatinine, Ser: 1.1 mg/dL (ref 0.61–1.24)
Creatinine, Ser: 1.1 mg/dL (ref 0.61–1.24)
Creatinine, Ser: 1.1 mg/dL (ref 0.61–1.24)
Creatinine, Ser: 1 mg/dL (ref 0.61–1.24)
Glucose, Bld: 106 mg/dL — ABNORMAL HIGH (ref 65–99)
Glucose, Bld: 101 mg/dL — ABNORMAL HIGH (ref 65–99)
Glucose, Bld: 93 mg/dL (ref 65–99)
Glucose, Bld: 99 mg/dL (ref 65–99)
Glucose, Bld: 113 mg/dL — ABNORMAL HIGH (ref 65–99)
Glucose, Bld: 119 mg/dL — ABNORMAL HIGH (ref 65–99)
HCT: 40 % (ref 39.0–52.0)
HCT: 30 % — ABNORMAL LOW (ref 39.0–52.0)
HCT: 36 % — ABNORMAL LOW (ref 39.0–52.0)
HCT: 27 % — ABNORMAL LOW (ref 39.0–52.0)
HCT: 30 % — ABNORMAL LOW (ref 39.0–52.0)
HCT: 35 % — ABNORMAL LOW (ref 39.0–52.0)
Hemoglobin: 13.6 g/dL (ref 13.0–17.0)
Hemoglobin: 10.2 g/dL — ABNORMAL LOW (ref 13.0–17.0)
Hemoglobin: 12.2 g/dL — ABNORMAL LOW (ref 13.0–17.0)
Hemoglobin: 9.2 g/dL — ABNORMAL LOW (ref 13.0–17.0)
Hemoglobin: 10.2 g/dL — ABNORMAL LOW (ref 13.0–17.0)
Hemoglobin: 11.9 g/dL — ABNORMAL LOW (ref 13.0–17.0)
Potassium: 4.2 mmol/L (ref 3.5–5.1)
Potassium: 4.2 mmol/L (ref 3.5–5.1)
Potassium: 4.2 mmol/L (ref 3.5–5.1)
Potassium: 4.6 mmol/L (ref 3.5–5.1)
Potassium: 4.1 mmol/L (ref 3.5–5.1)
Potassium: 4.2 mmol/L (ref 3.5–5.1)
Sodium: 139 mmol/L (ref 135–145)
Sodium: 136 mmol/L (ref 135–145)
Sodium: 138 mmol/L (ref 135–145)
Sodium: 136 mmol/L (ref 135–145)
Sodium: 138 mmol/L (ref 135–145)
Sodium: 140 mmol/L (ref 135–145)
TCO2: 25 mmol/L (ref 0–100)
TCO2: 25 mmol/L (ref 0–100)
TCO2: 26 mmol/L (ref 0–100)
TCO2: 23 mmol/L (ref 0–100)
TCO2: 22 mmol/L (ref 0–100)
TCO2: 23 mmol/L (ref 0–100)

## 2015-01-19 LAB — CREATININE, SERUM
Creatinine, Ser: 1.15 mg/dL (ref 0.61–1.24)
GFR calc Af Amer: >60 mL/min (ref >60)
GFR calc non Af Amer: >60 mL/min (ref >60)

## 2015-01-19 LAB — POCT I-STAT 3, ART BLOOD GAS (G3+)
Acid-base deficit: 3 mmol/L — ABNORMAL HIGH (ref 0.0–2.0)
Acid-base deficit: 4 mmol/L — ABNORMAL HIGH (ref 0.0–2.0)
Acid-base deficit: 3 mmol/L — ABNORMAL HIGH (ref 0.0–2.0)
Bicarbonate: 25.7 mEq/L — ABNORMAL HIGH (ref 20.0–24.0)
Bicarbonate: 22.8 mEq/L (ref 20.0–24.0)
Bicarbonate: 22.3 mEq/L (ref 20.0–24.0)
Bicarbonate: 23.2 mEq/L (ref 20.0–24.0)
O2 Saturation: 100 %
O2 Saturation: 95 %
O2 Saturation: 97 %
O2 Saturation: 94 %
Patient temperature: 36.1
TCO2: 27 mmol/L (ref 0–100)
TCO2: 24 mmol/L (ref 0–100)
TCO2: 24 mmol/L (ref 0–100)
TCO2: 25 mmol/L (ref 0–100)
pCO2 arterial: 45.7 mmHg — ABNORMAL HIGH (ref 35.0–45.0)
pCO2 arterial: 40.1 mmHg (ref 35.0–45.0)
pCO2 arterial: 44.4 mmHg (ref 35.0–45.0)
pCO2 arterial: 44.4 mmHg (ref 35.0–45.0)
pH, Arterial: 7.359 (ref 7.350–7.450)
pH, Arterial: 7.357 (ref 7.350–7.450)
pH, Arterial: 7.309 — ABNORMAL LOW (ref 7.350–7.450)
pH, Arterial: 7.327 — ABNORMAL LOW (ref 7.350–7.450)
pO2, Arterial: 351 mmHg — ABNORMAL HIGH (ref 80.0–100.0)
pO2, Arterial: 75 mmHg — ABNORMAL LOW (ref 80.0–100.0)
pO2, Arterial: 99 mmHg (ref 80.0–100.0)
pO2, Arterial: 77 mmHg — ABNORMAL LOW (ref 80.0–100.0)

## 2015-01-19 LAB — MAGNESIUM: Magnesium: 2.7 mg/dL — ABNORMAL HIGH (ref 1.7–2.4)

## 2015-01-19 LAB — CBC
HCT: 32.7 % — ABNORMAL LOW (ref 39.0–52.0)
HCT: 34.9 % — ABNORMAL LOW (ref 39.0–52.0)
Hemoglobin: 10.9 g/dL — ABNORMAL LOW (ref 13.0–17.0)
Hemoglobin: 11.7 g/dL — ABNORMAL LOW (ref 13.0–17.0)
MCH: 31.4 pg (ref 26.0–34.0)
MCH: 31.5 pg (ref 26.0–34.0)
MCHC: 33.3 g/dL (ref 30.0–36.0)
MCHC: 33.5 g/dL (ref 30.0–36.0)
MCV: 94.2 fL (ref 78.0–100.0)
MCV: 94.1 fL (ref 78.0–100.0)
PLATELETS: 133 10*3/uL — AB (ref 150–400)
Platelets: 129 10*3/uL — ABNORMAL LOW (ref 150–400)
RBC: 3.47 MIL/uL — ABNORMAL LOW (ref 4.22–5.81)
RBC: 3.71 MIL/uL — ABNORMAL LOW (ref 4.22–5.81)
RDW: 13.5 % (ref 11.5–15.5)
RDW: 13.3 % (ref 11.5–15.5)
WBC: 12 10*3/uL — AB (ref 4.0–10.5)
WBC: 9.9 10*3/uL (ref 4.0–10.5)

## 2015-01-19 LAB — GLUCOSE, CAPILLARY
Glucose-Capillary: 114 mg/dL — ABNORMAL HIGH (ref 65–99)
Glucose-Capillary: 107 mg/dL — ABNORMAL HIGH (ref 65–99)
Glucose-Capillary: 111 mg/dL — ABNORMAL HIGH (ref 65–99)
Glucose-Capillary: 110 mg/dL — ABNORMAL HIGH (ref 65–99)
Glucose-Capillary: 112 mg/dL — ABNORMAL HIGH (ref 65–99)

## 2015-01-19 LAB — POCT I-STAT 4, (NA,K, GLUC, HGB,HCT)
Glucose, Bld: 110 mg/dL — ABNORMAL HIGH (ref 65–99)
HCT: 34 % — ABNORMAL LOW (ref 39.0–52.0)
Hemoglobin: 11.6 g/dL — ABNORMAL LOW (ref 13.0–17.0)
Potassium: 4 mmol/L (ref 3.5–5.1)
Sodium: 141 mmol/L (ref 135–145)

## 2015-01-19 LAB — HEMOGLOBIN AND HEMATOCRIT, BLOOD
HCT: 28.4 % — ABNORMAL LOW (ref 39.0–52.0)
Hemoglobin: 9.6 g/dL — ABNORMAL LOW (ref 13.0–17.0)

## 2015-01-19 LAB — APTT: APTT: 41 s — AB (ref 24–37)

## 2015-01-19 LAB — PLATELET COUNT: Platelets: 144 10*3/uL — ABNORMAL LOW (ref 150–400)

## 2015-01-19 LAB — PROTIME-INR
INR: 1.46 (ref 0.00–1.49)
PROTHROMBIN TIME: 17.8 s — AB (ref 11.6–15.2)

## 2015-01-19 SURGERY — CORONARY ARTERY BYPASS GRAFTING (CABG)
Anesthesia: General | Site: Leg Upper | Laterality: Right

## 2015-01-19 MED ORDER — SODIUM CHLORIDE 0.9 % IV SOLN: INTRAVENOUS | Status: DC

## 2015-01-19 MED ORDER — SODIUM CHLORIDE 0.9 % IV SOLN
INTRAVENOUS | Status: DC
  Administered 2015-01-19: 0.5 [IU]/h via INTRAVENOUS
  Filled 2015-01-19: qty 2.5

## 2015-01-19 MED ORDER — DEXMEDETOMIDINE HCL IN NACL 200 MCG/50ML IV SOLN
0.0000 ug/kg/h | INTRAVENOUS | Status: DC
  Administered 2015-01-19: 0.7 ug/kg/h via INTRAVENOUS
  Filled 2015-01-19 (×2): qty 50

## 2015-01-19 MED ORDER — SODIUM CHLORIDE 0.9 % IJ SOLN
3.0000 mL | Freq: Two times a day (BID) | INTRAMUSCULAR | Status: DC
  Administered 2015-01-20: 3 mL via INTRAVENOUS

## 2015-01-19 MED ORDER — VANCOMYCIN HCL IN DEXTROSE 1-5 GM/200ML-% IV SOLN
1000.0000 mg | Freq: Once | INTRAVENOUS | Status: AC
  Administered 2015-01-19: 1000 mg via INTRAVENOUS
  Filled 2015-01-19: qty 200

## 2015-01-19 MED ORDER — HEPARIN SODIUM (PORCINE) 1000 UNIT/ML IJ SOLN
INTRAMUSCULAR | Status: AC
  Filled 2015-01-19: qty 1

## 2015-01-19 MED ORDER — MIDAZOLAM HCL 2 MG/2ML IJ SOLN: 2.0000 mg | INTRAMUSCULAR | Status: DC | PRN

## 2015-01-19 MED ORDER — LATANOPROST 0.005 % OP SOLN
1.0000 [drp] | Freq: Every day | OPHTHALMIC | Status: DC
  Administered 2015-01-20 – 2015-01-29 (×7): 1 [drp] via OPHTHALMIC
  Filled 2015-01-19 (×3): qty 2.5

## 2015-01-19 MED ORDER — PROPOFOL 10 MG/ML IV BOLUS
INTRAVENOUS | Status: DC | PRN
  Administered 2015-01-19: 125 mg via INTRAVENOUS

## 2015-01-19 MED ORDER — PROTAMINE SULFATE 10 MG/ML IV SOLN
INTRAVENOUS | Status: AC
  Filled 2015-01-19: qty 25

## 2015-01-19 MED ORDER — MIDAZOLAM HCL 2 MG/2ML IJ SOLN
INTRAMUSCULAR | Status: AC
  Filled 2015-01-19: qty 2

## 2015-01-19 MED ORDER — ONDANSETRON HCL 4 MG/2ML IJ SOLN
4.0000 mg | Freq: Four times a day (QID) | INTRAMUSCULAR | Status: DC | PRN
  Administered 2015-01-19 – 2015-01-20 (×3): 4 mg via INTRAVENOUS
  Filled 2015-01-19 (×3): qty 2

## 2015-01-19 MED ORDER — LACTATED RINGERS IV SOLN
INTRAVENOUS | Status: DC | PRN
  Administered 2015-01-19 (×2): via INTRAVENOUS

## 2015-01-19 MED ORDER — POTASSIUM CHLORIDE 10 MEQ/50ML IV SOLN: 10.0000 meq | INTRAVENOUS | Status: AC

## 2015-01-19 MED ORDER — TIMOLOL MALEATE 0.25 % OP SOLN
1.0000 [drp] | Freq: Every day | OPHTHALMIC | Status: DC
  Administered 2015-01-20 – 2015-01-29 (×9): 1 [drp] via OPHTHALMIC
  Filled 2015-01-19 (×2): qty 5

## 2015-01-19 MED ORDER — SODIUM CHLORIDE 0.9 % IJ SOLN
INTRAMUSCULAR | Status: AC
  Filled 2015-01-19: qty 10

## 2015-01-19 MED ORDER — BISACODYL 10 MG RE SUPP: 10.0000 mg | Freq: Every day | RECTAL | Status: DC

## 2015-01-19 MED ORDER — SODIUM CHLORIDE 0.9 % IV SOLN
INTRAVENOUS | Status: DC
  Filled 2015-01-19: qty 20

## 2015-01-19 MED ORDER — FENTANYL CITRATE (PF) 100 MCG/2ML IJ SOLN
INTRAMUSCULAR | Status: DC | PRN
  Administered 2015-01-19: 50 ug via INTRAVENOUS
  Administered 2015-01-19: 100 ug via INTRAVENOUS
  Administered 2015-01-19: 50 ug via INTRAVENOUS
  Administered 2015-01-19 (×2): 100 ug via INTRAVENOUS
  Administered 2015-01-19: 250 ug via INTRAVENOUS
  Administered 2015-01-19: 50 ug via INTRAVENOUS
  Administered 2015-01-19: 100 ug via INTRAVENOUS
  Administered 2015-01-19: 250 ug via INTRAVENOUS
  Administered 2015-01-19: 150 ug via INTRAVENOUS
  Administered 2015-01-19: 50 ug via INTRAVENOUS
  Administered 2015-01-19: 150 ug via INTRAVENOUS
  Administered 2015-01-19: 100 ug via INTRAVENOUS

## 2015-01-19 MED ORDER — HEMOSTATIC AGENTS (NO CHARGE) OPTIME
TOPICAL | Status: DC | PRN
  Administered 2015-01-19: 1 via TOPICAL

## 2015-01-19 MED ORDER — 0.9 % SODIUM CHLORIDE (POUR BTL) OPTIME
TOPICAL | Status: DC | PRN
  Administered 2015-01-19: 5000 mL

## 2015-01-19 MED ORDER — MORPHINE SULFATE 2 MG/ML IJ SOLN: 1.0000 mg | INTRAMUSCULAR | Status: AC | PRN

## 2015-01-19 MED ORDER — ACETAMINOPHEN 160 MG/5ML PO SOLN: 1000.0000 mg | Freq: Four times a day (QID) | ORAL | Status: DC

## 2015-01-19 MED ORDER — ROCURONIUM BROMIDE 100 MG/10ML IV SOLN
INTRAVENOUS | Status: DC | PRN
  Administered 2015-01-19: 100 mg via INTRAVENOUS

## 2015-01-19 MED ORDER — FENTANYL CITRATE (PF) 250 MCG/5ML IJ SOLN
INTRAMUSCULAR | Status: AC
  Filled 2015-01-19: qty 5

## 2015-01-19 MED ORDER — INSULIN REGULAR BOLUS VIA INFUSION
0.0000 [IU] | Freq: Three times a day (TID) | INTRAVENOUS | Status: DC
  Filled 2015-01-19: qty 10

## 2015-01-19 MED ORDER — SODIUM CHLORIDE 0.9 % IV SOLN: 250.0000 mL | INTRAVENOUS | Status: DC

## 2015-01-19 MED ORDER — CHLORHEXIDINE GLUCONATE 0.12 % MT SOLN
15.0000 mL | Freq: Two times a day (BID) | OROMUCOSAL | Status: DC
  Administered 2015-01-19 – 2015-01-20 (×2): 15 mL via OROMUCOSAL
  Filled 2015-01-19 (×2): qty 15

## 2015-01-19 MED ORDER — MIDAZOLAM HCL 10 MG/2ML IJ SOLN
INTRAMUSCULAR | Status: AC
  Filled 2015-01-19: qty 2

## 2015-01-19 MED ORDER — PANTOPRAZOLE SODIUM 40 MG PO TBEC: 40.0000 mg | DELAYED_RELEASE_TABLET | Freq: Every day | ORAL | Status: DC

## 2015-01-19 MED ORDER — DUTASTERIDE 0.5 MG PO CAPS
0.5000 mg | ORAL_CAPSULE | Freq: Every day | ORAL | Status: DC
  Administered 2015-01-20 – 2015-01-29 (×8): 0.5 mg via ORAL
  Filled 2015-01-19 (×12): qty 1

## 2015-01-19 MED ORDER — ARTIFICIAL TEARS OP OINT
TOPICAL_OINTMENT | OPHTHALMIC | Status: AC
  Filled 2015-01-19: qty 3.5

## 2015-01-19 MED ORDER — EPHEDRINE SULFATE 50 MG/ML IJ SOLN
INTRAMUSCULAR | Status: DC | PRN
  Administered 2015-01-19: 5 mg via INTRAVENOUS

## 2015-01-19 MED ORDER — SODIUM CHLORIDE 0.45 % IV SOLN
INTRAVENOUS | Status: DC | PRN
  Administered 2015-01-19: 14:00:00 via INTRAVENOUS

## 2015-01-19 MED ORDER — NITROGLYCERIN IN D5W 200-5 MCG/ML-% IV SOLN: 0.0000 ug/min | INTRAVENOUS | Status: DC

## 2015-01-19 MED ORDER — ALBUMIN HUMAN 5 % IV SOLN
INTRAVENOUS | Status: DC | PRN
  Administered 2015-01-19 (×3): via INTRAVENOUS

## 2015-01-19 MED ORDER — STERILE WATER FOR INJECTION IJ SOLN
INTRAMUSCULAR | Status: AC
  Filled 2015-01-19: qty 10

## 2015-01-19 MED ORDER — LACTATED RINGERS IV SOLN
INTRAVENOUS | Status: DC | PRN
  Administered 2015-01-19: 07:00:00 via INTRAVENOUS

## 2015-01-19 MED ORDER — METOPROLOL TARTRATE 25 MG/10 ML ORAL SUSPENSION
12.5000 mg | Freq: Two times a day (BID) | ORAL | Status: DC
  Filled 2015-01-19 (×5): qty 5

## 2015-01-19 MED ORDER — ATORVASTATIN CALCIUM 20 MG PO TABS
20.0000 mg | ORAL_TABLET | Freq: Every day | ORAL | Status: DC
  Administered 2015-01-20 – 2015-01-29 (×9): 20 mg via ORAL
  Filled 2015-01-19 (×11): qty 1

## 2015-01-19 MED ORDER — ACETAMINOPHEN 650 MG RE SUPP
650.0000 mg | Freq: Once | RECTAL | Status: AC
Start: 2015-01-19 — End: 2015-01-19
  Administered 2015-01-19: 650 mg via RECTAL

## 2015-01-19 MED ORDER — ASPIRIN 81 MG PO CHEW: 324.0000 mg | CHEWABLE_TABLET | Freq: Every day | ORAL | Status: DC

## 2015-01-19 MED ORDER — VECURONIUM BROMIDE 10 MG IV SOLR
INTRAVENOUS | Status: AC
  Filled 2015-01-19: qty 10

## 2015-01-19 MED ORDER — MAGNESIUM SULFATE 4 GM/100ML IV SOLN
4.0000 g | Freq: Once | INTRAVENOUS | Status: AC
  Administered 2015-01-19: 4 g via INTRAVENOUS
  Filled 2015-01-19: qty 100

## 2015-01-19 MED ORDER — OXYCODONE HCL 5 MG PO TABS: 5.0000 mg | ORAL_TABLET | ORAL | Status: DC | PRN

## 2015-01-19 MED ORDER — FAMOTIDINE IN NACL 20-0.9 MG/50ML-% IV SOLN
20.0000 mg | Freq: Two times a day (BID) | INTRAVENOUS | Status: AC
  Administered 2015-01-19: 20 mg via INTRAVENOUS

## 2015-01-19 MED ORDER — DOCUSATE SODIUM 100 MG PO CAPS
200.0000 mg | ORAL_CAPSULE | Freq: Every day | ORAL | Status: DC
  Administered 2015-01-20: 200 mg via ORAL
  Filled 2015-01-19: qty 2

## 2015-01-19 MED ORDER — SODIUM BICARBONATE 8.4 % IV SOLN
50.0000 meq | Freq: Once | INTRAVENOUS | Status: AC
  Administered 2015-01-19: 50 meq via INTRAVENOUS

## 2015-01-19 MED ORDER — CETYLPYRIDINIUM CHLORIDE 0.05 % MT LIQD
7.0000 mL | Freq: Four times a day (QID) | OROMUCOSAL | Status: DC
  Administered 2015-01-19 – 2015-01-20 (×4): 7 mL via OROMUCOSAL

## 2015-01-19 MED ORDER — TRAMADOL HCL 50 MG PO TABS
50.0000 mg | ORAL_TABLET | ORAL | Status: DC | PRN
  Administered 2015-01-21: 50 mg via ORAL
  Filled 2015-01-19: qty 1

## 2015-01-19 MED ORDER — PROTAMINE SULFATE 10 MG/ML IV SOLN
INTRAVENOUS | Status: DC | PRN
  Administered 2015-01-19 (×2): 50 mg via INTRAVENOUS
  Administered 2015-01-19: 20 mg via INTRAVENOUS
  Administered 2015-01-19: 50 mg via INTRAVENOUS
  Administered 2015-01-19: 30 mg via INTRAVENOUS

## 2015-01-19 MED ORDER — SUCCINYLCHOLINE CHLORIDE 20 MG/ML IJ SOLN
INTRAMUSCULAR | Status: AC
  Filled 2015-01-19: qty 1

## 2015-01-19 MED ORDER — PHENYLEPHRINE HCL 10 MG/ML IJ SOLN
0.0000 ug/min | INTRAVENOUS | Status: DC
  Administered 2015-01-19: 5 ug/min via INTRAVENOUS
  Administered 2015-01-20: 10 ug/min via INTRAVENOUS
  Filled 2015-01-19 (×2): qty 2

## 2015-01-19 MED ORDER — ROCURONIUM BROMIDE 50 MG/5ML IV SOLN
INTRAVENOUS | Status: AC
  Filled 2015-01-19: qty 2

## 2015-01-19 MED ORDER — METOPROLOL TARTRATE 12.5 MG HALF TABLET
12.5000 mg | ORAL_TABLET | Freq: Two times a day (BID) | ORAL | Status: DC
  Administered 2015-01-20: 12.5 mg via ORAL
  Filled 2015-01-19 (×5): qty 1

## 2015-01-19 MED ORDER — METOPROLOL TARTRATE 1 MG/ML IV SOLN: 2.5000 mg | INTRAVENOUS | Status: DC | PRN

## 2015-01-19 MED ORDER — ALBUMIN HUMAN 5 % IV SOLN
250.0000 mL | INTRAVENOUS | Status: AC | PRN
  Administered 2015-01-19 – 2015-01-20 (×3): 250 mL via INTRAVENOUS
  Filled 2015-01-19: qty 250

## 2015-01-19 MED ORDER — ASPIRIN EC 325 MG PO TBEC
325.0000 mg | DELAYED_RELEASE_TABLET | Freq: Every day | ORAL | Status: DC
  Administered 2015-01-20: 325 mg via ORAL
  Filled 2015-01-19 (×2): qty 1

## 2015-01-19 MED ORDER — DEXTROSE 5 % IV SOLN
1.5000 g | Freq: Two times a day (BID) | INTRAVENOUS | Status: AC
  Administered 2015-01-19 – 2015-01-21 (×4): 1.5 g via INTRAVENOUS
  Filled 2015-01-19 (×4): qty 1.5

## 2015-01-19 MED ORDER — LACTATED RINGERS IV SOLN
500.0000 mL | Freq: Once | INTRAVENOUS | Status: AC | PRN
  Administered 2015-01-19: 21:00:00 via INTRAVENOUS

## 2015-01-19 MED ORDER — LACTATED RINGERS IV SOLN: INTRAVENOUS | Status: DC

## 2015-01-19 MED ORDER — MORPHINE SULFATE 2 MG/ML IJ SOLN
2.0000 mg | INTRAMUSCULAR | Status: DC | PRN
Start: 2015-01-19 — End: 2015-01-21
  Administered 2015-01-19 – 2015-01-20 (×7): 2 mg via INTRAVENOUS
  Filled 2015-01-19 (×7): qty 1

## 2015-01-19 MED ORDER — ARTIFICIAL TEARS OP OINT
TOPICAL_OINTMENT | OPHTHALMIC | Status: DC | PRN
  Administered 2015-01-19: 1 via OPHTHALMIC

## 2015-01-19 MED ORDER — ACETAMINOPHEN 500 MG PO TABS
1000.0000 mg | ORAL_TABLET | Freq: Four times a day (QID) | ORAL | Status: DC
  Administered 2015-01-20 – 2015-01-21 (×4): 1000 mg via ORAL
  Filled 2015-01-19 (×8): qty 2

## 2015-01-19 MED ORDER — HEPARIN SODIUM (PORCINE) 1000 UNIT/ML IJ SOLN
INTRAMUSCULAR | Status: DC | PRN
  Administered 2015-01-19: 24000 [IU] via INTRAVENOUS

## 2015-01-19 MED ORDER — MIDAZOLAM HCL 5 MG/5ML IJ SOLN
INTRAMUSCULAR | Status: DC | PRN
  Administered 2015-01-19 (×3): 2 mg via INTRAVENOUS
  Administered 2015-01-19: 6 mg via INTRAVENOUS

## 2015-01-19 MED ORDER — EPHEDRINE SULFATE 50 MG/ML IJ SOLN
INTRAMUSCULAR | Status: AC
  Filled 2015-01-19: qty 1

## 2015-01-19 MED ORDER — BISACODYL 5 MG PO TBEC
10.0000 mg | DELAYED_RELEASE_TABLET | Freq: Every day | ORAL | Status: DC
  Administered 2015-01-20: 10 mg via ORAL
  Filled 2015-01-19: qty 2

## 2015-01-19 MED ORDER — SODIUM CHLORIDE 0.9 % IJ SOLN: 3.0000 mL | INTRAMUSCULAR | Status: DC | PRN

## 2015-01-19 MED ORDER — PROPOFOL 10 MG/ML IV BOLUS
INTRAVENOUS | Status: AC
  Filled 2015-01-19: qty 20

## 2015-01-19 MED ORDER — SODIUM CHLORIDE 0.9 % IJ SOLN
OROMUCOSAL | Status: DC | PRN
  Administered 2015-01-19: 12 mL via TOPICAL

## 2015-01-19 MED ORDER — SODIUM CHLORIDE 0.9 % IV SOLN
INTRAVENOUS | Status: DC | PRN
  Administered 2015-01-19: 12:00:00 via INTRAVENOUS

## 2015-01-19 MED ORDER — LACTATED RINGERS IV SOLN
INTRAVENOUS | Status: DC | PRN
  Administered 2015-01-19 (×2): via INTRAVENOUS

## 2015-01-19 MED ORDER — ACETAMINOPHEN 160 MG/5ML PO SOLN: 650.0000 mg | Freq: Once | ORAL | Status: AC

## 2015-01-19 MED ORDER — VECURONIUM BROMIDE 10 MG IV SOLR
INTRAVENOUS | Status: DC | PRN
  Administered 2015-01-19 (×3): 5 mg via INTRAVENOUS

## 2015-01-19 SURGICAL SUPPLY — 64 items
BAG DECANTER FOR FLEXI CONT (MISCELLANEOUS) ×4 IMPLANT
BANDAGE ELASTIC 4 VELCRO ST LF (GAUZE/BANDAGES/DRESSINGS) ×4 IMPLANT
BANDAGE ELASTIC 6 VELCRO ST LF (GAUZE/BANDAGES/DRESSINGS) ×4 IMPLANT
BLADE STERNUM SYSTEM 6 (BLADE) ×4 IMPLANT
BNDG GAUZE ELAST 4 BULKY (GAUZE/BANDAGES/DRESSINGS) ×4 IMPLANT
CANISTER SUCTION 2500CC (MISCELLANEOUS) ×4 IMPLANT
CANNULA ARTERIAL NVNT 3/8 22FR (MISCELLANEOUS) ×4 IMPLANT
CATH CPB KIT GERHARDT (MISCELLANEOUS) ×4 IMPLANT
CATH THORACIC 28FR (CATHETERS) ×4 IMPLANT
CLIP FOGARTY SPRING 6M (CLIP) ×4 IMPLANT
CLSR STERI-STRIP ANTIMIC 1/2X4 (GAUZE/BANDAGES/DRESSINGS) ×4 IMPLANT
CRADLE DONUT ADULT HEAD (MISCELLANEOUS) ×4 IMPLANT
DRAIN CHANNEL 28F RND 3/8 FF (WOUND CARE) ×4 IMPLANT
DRAPE CARDIOVASCULAR INCISE (DRAPES) ×1
DRAPE SLUSH/WARMER DISC (DRAPES) ×4 IMPLANT
DRAPE SRG 135X102X78XABS (DRAPES) ×3 IMPLANT
DRSG AQUACEL AG ADV 3.5X14 (GAUZE/BANDAGES/DRESSINGS) ×4 IMPLANT
ELECT BLADE 4.0 EZ CLEAN MEGAD (MISCELLANEOUS) ×4
ELECT REM PT RETURN 9FT ADLT (ELECTROSURGICAL) ×8
ELECTRODE BLDE 4.0 EZ CLN MEGD (MISCELLANEOUS) ×3 IMPLANT
ELECTRODE REM PT RTRN 9FT ADLT (ELECTROSURGICAL) ×6 IMPLANT
GAUZE SPONGE 4X4 12PLY STRL (GAUZE/BANDAGES/DRESSINGS) ×8 IMPLANT
GLOVE BIO SURGEON STRL SZ 6.5 (GLOVE) ×28 IMPLANT
GLOVE BIOGEL PI IND STRL 6.5 (GLOVE) ×9 IMPLANT
GLOVE BIOGEL PI INDICATOR 6.5 (GLOVE) ×3
GLOVE SURG SS PI 6.5 STRL IVOR (GLOVE) ×4 IMPLANT
GOWN STRL REUS W/ TWL LRG LVL3 (GOWN DISPOSABLE) ×24 IMPLANT
GOWN STRL REUS W/TWL LRG LVL3 (GOWN DISPOSABLE) ×8
HEMOSTAT POWDER SURGIFOAM 1G (HEMOSTASIS) ×12 IMPLANT
HEMOSTAT SURGICEL 2X14 (HEMOSTASIS) ×4 IMPLANT
KIT BASIN OR (CUSTOM PROCEDURE TRAY) ×4 IMPLANT
KIT CATH SUCT 8FR (CATHETERS) ×4 IMPLANT
KIT ROOM TURNOVER OR (KITS) ×4 IMPLANT
KIT SUCTION CATH 14FR (SUCTIONS) ×8 IMPLANT
KIT VASOVIEW W/TROCAR VH 2000 (KITS) ×4 IMPLANT
LEAD PACING MYOCARDI (MISCELLANEOUS) ×4 IMPLANT
MARKER GRAFT CORONARY BYPASS (MISCELLANEOUS) ×12 IMPLANT
NS IRRIG 1000ML POUR BTL (IV SOLUTION) ×20 IMPLANT
PACK OPEN HEART (CUSTOM PROCEDURE TRAY) ×4 IMPLANT
PAD ARMBOARD 7.5X6 YLW CONV (MISCELLANEOUS) ×8 IMPLANT
PAD ELECT DEFIB RADIOL ZOLL (MISCELLANEOUS) ×4 IMPLANT
PENCIL BUTTON HOLSTER BLD 10FT (ELECTRODE) ×4 IMPLANT
PUNCH AORTIC ROTATE 4.5MM 8IN (MISCELLANEOUS) ×4 IMPLANT
PUNCH AORTIC ROTATE 5MM 8IN (MISCELLANEOUS) ×4 IMPLANT
SPONGE GAUZE 4X4 12PLY STER LF (GAUZE/BANDAGES/DRESSINGS) ×4 IMPLANT
SPONGE LAP 18X18 X RAY DECT (DISPOSABLE) ×8 IMPLANT
SUT BONE WAX W31G (SUTURE) ×4 IMPLANT
SUT PROLENE 3 0 SH1 36 (SUTURE) ×4 IMPLANT
SUT PROLENE 4 0 TF (SUTURE) ×8 IMPLANT
SUT PROLENE 6 0 CC (SUTURE) ×20 IMPLANT
SUT PROLENE 7 0 BV1 MDA (SUTURE) ×8 IMPLANT
SUT PROLENE 8 0 BV175 6 (SUTURE) ×4 IMPLANT
SUT SILK 2 0 SH CR/8 (SUTURE) ×4 IMPLANT
SUT STEEL 6MS V (SUTURE) ×4 IMPLANT
SUT STEEL SZ 6 DBL 3X14 BALL (SUTURE) ×4 IMPLANT
SUT VIC AB 1 CTX 18 (SUTURE) ×8 IMPLANT
SUTURE E-PAK OPEN HEART (SUTURE) ×4 IMPLANT
SYSTEM SAHARA CHEST DRAIN ATS (WOUND CARE) ×4 IMPLANT
TOWEL OR 17X24 6PK STRL BLUE (TOWEL DISPOSABLE) ×8 IMPLANT
TOWEL OR 17X26 10 PK STRL BLUE (TOWEL DISPOSABLE) ×8 IMPLANT
TRAY FOLEY IC TEMP SENS 16FR (CATHETERS) ×4 IMPLANT
TUBING INSUFFLATION (TUBING) ×4 IMPLANT
UNDERPAD 30X30 INCONTINENT (UNDERPADS AND DIAPERS) ×4 IMPLANT
WATER STERILE IRR 1000ML POUR (IV SOLUTION) ×8 IMPLANT

## 2015-01-19 NOTE — Anesthesia Procedure Notes (Addendum)
Procedure Name: Intubation Date/Time: 01/19/2015 7:46 AM Performed by: Neldon Newport Pre-anesthesia Checklist: Patient being monitored, Suction available, Emergency Drugs available, Patient identified and Timeout performed Patient Re-evaluated:Patient Re-evaluated prior to inductionOxygen Delivery Method: Circle system utilized Preoxygenation: Pre-oxygenation with 100% oxygen Intubation Type: IV induction Ventilation: Mask ventilation without difficulty, Two handed mask ventilation required and Oral airway inserted - appropriate to patient size Laryngoscope Size: Mac, 4, Miller and 2 Grade View: Grade III Tube type: Oral (Small amount of Gastric content noted on second DL.  Airway suctioned out and stomach decompressed) Tube size: 8.0 mm Airway Equipment and Method: Video-laryngoscopy Placement Confirmation: ETT inserted through vocal cords under direct vision,  positive ETCO2 and breath sounds checked- equal and bilateral Secured at: 24 cm Tube secured with: Tape Dental Injury: Injury to lip  Difficulty Due To: Difficulty was unanticipated and Difficult Airway- due to anterior larynx      The patient was identified and consent obtained.  TO was performed, and full barrier precautions were used.  The skin was anesthetized with lidocaine.  Once the vein was located with the 22 ga. needle using ultrasound guidance , the wire was inserted into the vein.  The wire location was confirmed with ultrasound.  The insertion site was dilated and the introducer was carefully inserted and sutured in place. The PAC was checked, and floated into the PA.  Once in the PA, the catheter was secured. The patient tolerated the procedure well.  CXR was ordered for PACU. Start: 2518 End: [place] J. Tedra Senegal, MD

## 2015-01-19 NOTE — Progress Notes (Signed)
Patient ID: Bradley [name], male   DOB: 09/07/1940, 74 y.o.   MRN: [id number]   SICU Evening Rounds:   Hemodynamically stable  CI = 2.1  extubated  Urine output good  CT output low  CBC    Component Value Date/Time   WBC 12.0* 01/19/2015 1355   WBC 6.5 09/25/2012 1330   RBC 3.47* 01/19/2015 1355   RBC 4.42 09/25/2012 1330   HGB 11.9* 01/19/2015 1855   HGB 14.3 09/25/2012 1330   HCT 35.0* 01/19/2015 1855   HCT 42.4 09/25/2012 1330   PLT 133* 01/19/2015 1355   PLT 214 09/25/2012 1330   MCV 94.2 01/19/2015 1355   MCV 96 09/25/2012 1330   MCH 31.4 01/19/2015 1355   MCH 32.3 09/25/2012 1330   MCHC 33.3 01/19/2015 1355   MCHC 33.7 09/25/2012 1330   RDW 13.5 01/19/2015 1355   RDW 13.3 09/25/2012 1330   LYMPHSABS 0.6* 09/16/2011 0659   MONOABS 0.6 09/16/2011 0659   EOSABS 0.2 09/16/2011 0659   BASOSABS 0.0 09/16/2011 0659     BMET    Component Value Date/Time   NA 140 01/19/2015 1855   NA 140 09/25/2012 1330   K 4.2 01/19/2015 1855   K 4.3 09/25/2012 1330   CL 105 01/19/2015 1855   CL 109* 09/25/2012 1330   CO2 24 01/15/2015 1040   CO2 24 09/25/2012 1330   GLUCOSE 119* 01/19/2015 1855   GLUCOSE 89 09/25/2012 1330   BUN 16 01/19/2015 1855   BUN 26* 09/25/2012 1330   CREATININE 1.00 01/19/2015 1855   CREATININE 1.10 09/25/2012 1330   CALCIUM 9.6 01/15/2015 1040   CALCIUM 9.0 09/25/2012 1330   GFRNONAA >60 01/15/2015 1040   GFRNONAA >60 09/25/2012 1330   GFRAA >60 01/15/2015 1040   GFRAA >60 09/25/2012 1330     A/P:  Stable postop course. Continue current plans

## 2015-01-19 NOTE — Anesthesia Preprocedure Evaluation (Addendum)
Anesthesia Evaluation  Patient identified by MRN, date of birth, ID band Patient awake    Reviewed: Allergy & Precautions, NPO status , Patient's Chart, lab work & pertinent test results  Airway Mallampati: II  TM Distance: >3 FB Neck ROM: full    Dental  (+) Teeth Intact, Dental Advidsory Given, Caps   Pulmonary shortness of breath and with exertion,  breath sounds clear to auscultation        Cardiovascular hypertension, Pt. on medications + angina with exertion + CAD Rhythm:regular Rate:Bradycardia  EF 60% on cath   Neuro/Psych  Headaches, negative neurological ROS     GI/Hepatic Neg liver ROS, hiatal hernia, GERD-  ,  Endo/Other  negative endocrine ROS  Renal/GU negative Renal ROS     Musculoskeletal negative musculoskeletal ROS (+)   Abdominal   Peds  Hematology negative hematology ROS (+)   Anesthesia Other Findings   Reproductive/Obstetrics                           Anesthesia Physical Anesthesia Plan  ASA: IV  Anesthesia Plan: General   Post-op Pain Management:    Induction: Intravenous  Airway Management Planned: Oral ETT  Additional Equipment: Arterial line, TEE, CVP, PA Cath and Ultrasound Guidance Line Placement  Intra-op Plan:   Post-operative Plan: Post-operative intubation/ventilation  Informed Consent: I have reviewed the patients History and Physical, chart, labs and discussed the procedure including the risks, benefits and alternatives for the proposed anesthesia with the patient or authorized representative who has indicated his/her understanding and acceptance.   Dental advisory given and Dental Advisory Given  Plan Discussed with: CRNA and Anesthesiologist  Anesthesia Plan Comments:        Anesthesia Quick Evaluation

## 2015-01-19 NOTE — H&P (Signed)
[address]       [name] [address]             [phone]                    Bradley [name] [place] Medical Record #[id number] Date of Birth: Aug 24, 1940  Referring: Corey Skains, MD Primary Care: [facility], Chrissie Noa, MD  Chief Complaint:    Angina   History of Present Illness:    Bradley [name] 74 y.o. male is seen in the office for consideration of coronary artery bypass grafting. The patient had a previous history of known coronary artery disease having had a angioplasty and atherectomy done in 1999 at [facility]. He done well over the years but noted over the past several months increasing episodes of substernal discomfort radiating to his left arm with exertion. He notes that that exertion brings the discomfort on, he denies rest discomfort. The symptoms have been progressing over the past several months, he now become symptomatic if he walks a quarter of a mile 6 months ago he was able to walk 2-3 miles without difficulty.      Current Activity/ Functional Status:  Patient is independent with mobility/ambulation, transfers, ADL's, IADL's.   Zubrod Score: At the time of surgery this patient's most appropriate activity status/level should be described as: [x]     0    Normal activity, no symptoms []     1    Restricted in physical strenuous activity but ambulatory, able to do out light work []     2    Ambulatory and capable of self care, unable to do work activities, up and about               >50 % of waking hours                              []     3    Only limited self care, in bed greater than 50% of waking hours []     4    Completely disabled, no self care, confined to bed or chair []     5    Moribund   Past Medical History  Diagnosis Date  . Coronary artery disease   . Meningioma     stable, followed by yearly MRIs  . Anginal pain   . Shortness of breath dyspnea   . GERD (gastroesophageal reflux disease)   . History of hiatal hernia 2012    . Headache     migraines  . Benign tumor of brain 2016    Past Surgical History  Procedure Laterality Date  . Cardiac surgery      coronary angioplasty  . Cholecystectomy    . Hiatal hernia repair N/A   . Cardiac catheterization N/A 01/07/2015    Procedure: Left Heart Cath;  Surgeon: Corey Skains, MD;  Location: [facility];  Service: Cardiovascular;  Laterality: N/A;  . Colonoscopy  11/03/08, 03/09/14  . Esophagogastroduodenoscopy  02/19/13  . Esophagus surgery    . Coronary angioplasty    . Atherectomy  1992  . Diagnostic laparoscopy      cholecystectomy and hiatal hernia repair  . Eye surgery      cataracts BIL  . Hernia repair      Family History  Problem Relation Age of Onset  . Parkinsonism Mother   . Kidney disease  Father   . Cancer Father     prostate  . Heart disease Father   . Cancer Paternal Uncle     esophageal  . Cancer Paternal Uncle     History   Social History  . Marital Status: Married    Spouse Name: N/A  . Number of Children: N/A  . Years of Education: N/A   Occupational History  . Not on file.   Social History Main Topics  . Smoking status: Never Smoker   . Smokeless tobacco: Not on file  . Alcohol Use: No  . Drug Use: No  . Sexual Activity: Not on file     History  Smoking status  . Never Smoker   Smokeless tobacco  . Not on file    History  Alcohol Use No     Allergies  Allergen Reactions  . Oxycodone Other (See Comments)    hypotention  . Sulfa Antibiotics Rash    Current Facility-Administered Medications  Medication Dose Route Frequency Provider Last Rate Last Dose  . aminocaproic acid (AMICAR) 10 g in sodium chloride 0.9 % 100 mL infusion   Intravenous To OR Grace Isaac, MD      . cefUROXime (ZINACEF) 1.5 g in dextrose 5 % 50 mL IVPB  1.5 g Intravenous To OR Grace Isaac, MD      . cefUROXime (ZINACEF) 750 mg in dextrose 5 % 50 mL IVPB  750 mg Intravenous To OR Grace Isaac, MD      .  dexmedetomidine (PRECEDEX) 400 MCG/100ML (4 mcg/mL) infusion  0.1-0.7 mcg/kg/hr Intravenous To OR Grace Isaac, MD      . DOPamine (INTROPIN) 800 mg in dextrose 5 % 250 mL (3.2 mg/mL) infusion  0-10 mcg/kg/min Intravenous To OR Grace Isaac, MD      . EPINEPHrine (ADRENALIN) 4 mg in dextrose 5 % 250 mL (0.016 mg/mL) infusion  0-10 mcg/min Intravenous To OR Grace Isaac, MD      . heparin 2,500 Units, papaverine 30 mg in electrolyte-148 (PLASMALYTE-148) 500 mL irrigation   Irrigation To OR Grace Isaac, MD      . heparin 30,000 units/NS 1000 mL solution for CELLSAVER   Other To OR Grace Isaac, MD      . insulin regular (NOVOLIN R,HUMULIN R) 250 Units in sodium chloride 0.9 % 250 mL (1 Units/mL) infusion   Intravenous To OR Grace Isaac, MD      . magnesium sulfate (IV Push/IM) injection 40 mEq  40 mEq Other To OR Grace Isaac, MD      . metoprolol tartrate (LOPRESSOR) tablet 12.5 mg  12.5 mg Oral Once Grace Isaac, MD      . nitroGLYCERIN 50 mg in dextrose 5 % 250 mL (0.2 mg/mL) infusion  2-200 mcg/min Intravenous To OR Grace Isaac, MD      . phenylephrine (NEO-SYNEPHRINE) 20 mg in dextrose 5 % 250 mL (0.08 mg/mL) infusion  30-200 mcg/min Intravenous To OR Grace Isaac, MD      . potassium chloride injection 80 mEq  80 mEq Other To OR Grace Isaac, MD      . vancomycin (VANCOCIN) 1,500 mg in sodium chloride 0.9 % 250 mL IVPB  1,500 mg Intravenous To OR Grace Isaac, MD       Facility-Administered Medications Ordered in Other Encounters  Medication Dose Route Frequency Provider Last Rate Last Dose  . fentaNYL (SUBLIMAZE) injection  Anesthesia Intra-op Neldon Newport, CRNA   100 mcg at 01/19/15 [phone]  . lactated ringers infusion    Continuous PRN Neldon Newport, CRNA      . lactated ringers infusion    Continuous PRN Neldon Newport, CRNA      . lactated ringers infusion    Continuous PRN Neldon Newport, CRNA      . midazolam  (VERSED) 5 MG/5ML injection    Anesthesia Intra-op Neldon Newport, CRNA   2 mg at 01/19/15 9604      Review of Systems:     Cardiac Review of Systems: Y or N  Chest Pain [  y  ]  Resting SOB [ n  ] Exertional SOB  [ n ]  Orthopnea [ n ]   Pedal Edema [ n  ]    Palpitations [n  ] Syncope  [ n ]   Presyncope [  n ]  General Review of Systems: [Y] = yes [  ]=no Constitional: recent weight change [n  ];  Wt loss over the last 3 months [   ] anorexia [  ]; fatigue [  ]; nausea [  ]; night sweats [  ]; fever [  ]; or chills [  ];          Dental: poor dentition[  ]; Last Dentist visit:   Eye : blurred vision [  ]; diplopia [   ]; vision changes [  ];  Amaurosis fugax[ n ]; Resp: cough [  ];  wheezing[  ];  hemoptysis[  ]; shortness of breath[  ]; paroxysmal nocturnal dyspnea[  ]; dyspnea on exertion[ n ]; or orthopnea[  ];  GI:  gallstones[  ], vomiting[  ];  dysphagia[  ]; melena[  ];  hematochezia [  ]; heartburn[  ];   Hx of  Colonoscopy[  ]; GU: kidney stones [  ]; hematuria[  ];   dysuria [  ];  nocturia[  ];  history of     obstruction [ n ]; urinary frequency [  ]             Skin: rash, swelling[  ];, hair loss[  ];  peripheral edema[  ];  or itching[  ]; Musculosketetal: myalgias[  ];  joint swelling[  ];  joint erythema[  ];  joint pain[ y ];  back pain[  ];  Heme/Lymph: bruising[  ];  bleeding[  ];  anemia[  ];  Neuro: TIA[  ];  headaches[  ];  stroke[  ];  vertigo[  ];  seizures[  ];   paresthesias[n  ];  difficulty walking[n  ];  Psych:depression[  ]; anxiety[  ];  Endocrine: diabetes[ n ];  thyroid dysfunction[ n ];  Immunizations: Flu up to date [ y ]; Pneumococcal up to date [ y ];  Other:  Physical Exam: BP 145/68 mmHg  Pulse 57  Temp(Src) 97.8 F (36.6 C) (Oral)  Resp 20  Ht 6\' 3"  (1.905 m)  Wt 226 lb (102.513 kg)  BMI 28.25 kg/m2  SpO2 99%  PHYSICAL EXAMINATION: General appearance: alert, cooperative and appears stated age Head: Normocephalic, without obvious  abnormality, atraumatic Neck: no adenopathy, no carotid bruit, no JVD, supple, symmetrical, trachea midline and thyroid not enlarged, symmetric, no tenderness/mass/nodules Lymph nodes: Cervical, supraclavicular, and axillary nodes normal. Resp: clear to auscultation bilaterally Back: symmetric, no curvature. ROM normal. No CVA tenderness. 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  FINDINGS: The lungs are adequately inflated. There is minimal bibasilar subsegmental atelectasis or scarring. The heart and pulmonary vascularity are normal. The mediastinum is normal in width. There is mild tortuosity of the descending thoracic aorta. There is no pleural effusion or pneumothorax. There is mild multilevel degenerative disc disease of the thoracic spine.  IMPRESSION: COPD with probable bibasilar scarring. There is no CHF, pneumonia, nor other acute cardiopulmonary abnormality.   Electronically Signed   By: David  Martinique M.D.   On: 01/15/2015 10:09     I have independently reviewed the above radiologic studies.  Recent Lab Findings: Lab Results  Component Value Date   WBC 7.6 01/15/2015   HGB 15.1 01/15/2015   HCT 44.2 01/15/2015   PLT 221 01/15/2015   GLUCOSE 108* 01/15/2015   ALT 26 01/15/2015   AST 21 01/15/2015   NA 139 01/15/2015   K 4.5 01/15/2015   CL  108 01/15/2015   CREATININE 1.02 01/15/2015   BUN 16 01/15/2015   CO2 24 01/15/2015   INR 1.08 01/15/2015   HGBA1C 5.9* 01/15/2015   CATH: [place]  01/07/2015  Ost LAD to Prox LAD lesion, 85% stenosed.  Dist LAD lesion, 20% stenosed.  Prox Cx lesion, 20% stenosed.  Prox RCA to Mid RCA lesion, 99% stenosed.  Mid RCA lesion, 99% stenosed.  Dist RCA lesion, 40% stenosed.  Assessment The patient has had progressive canadian class 3 anginal symptoms with a high probability stress test with risk factors including high blood pressure and high cholesterol.  normal left ventricular function with ejection fraction of 60%  severe 2 vessel coronary artery disease   There is significant stenosis of left anterior descending and rca  Plan Continue medical management of CAD risk factors, Consider consultation for CABG and Additional medications for management of angina     Coronary Findings    Dominance: Right   Left Anterior Descending   . Ost LAD to Prox LAD lesion, 85% stenosed.   Jorene Minors LAD lesion, 20% stenosed.     Left Circumflex   . Prox Cx lesion, 20% stenosed.     Right Coronary Artery  The vessel is large is angiographically normal.   . Prox RCA to Mid RCA lesion, 99% stenosed.   . Mid RCA lesion, 99% stenosed.   . Dist RCA lesion, 40% stenosed.   . Acute Marginal Branch   Acute Mrg filled by collaterals from Acute Mrg. Acute Mrg filled by collaterals from Acute Mrg.     Vein Mapping: Study information:  Study status: Routine. Procedure: A vascular evaluation was performed. Image quality was adequate.  Lower extremity vein mapping.   Vessel mapping. Birthdate: Patient birthdate: [birth year]. Age: Patient is 74 yr old. Sex: Gender: male. Study date: Study date: 01/15/2015. Study time: 12:47 PM. Location: Vascular laboratory. Patient status: Outpatient.  Vein  mapping:  +----+----------------+--------+-----------------+----------------+ VeinLocation    DiameterObservations   Comments     +----+----------------+--------+-----------------+----------------+ GSV Origin     5.5 mm -----------------Patent                             Compressible   +----+----------------+--------+-----------------+----------------+ GSV Proximal right 3 mm  Branch      Patent        thigh                   Compressible   +----+----------------+--------+-----------------+----------------+ GSV Mid right thigh 3.1 mm -----------------Patent                             Compressible   +----+----------------+--------+-----------------+----------------+ GSV Distal right  3.6 mm -----------------Patent        thigh                   Compressible   +----+----------------+--------+-----------------+----------------+ GSV Right knee   3 mm  -----------------Patent                             Compressible   +----+----------------+--------+-----------------+----------------+ GSV Proximal right 3.1 mm -----------------Patent        calf                   Compressible   +----+----------------+--------+-----------------+----------------+ GSV Mid right calf 3 mm  -----------------Patent                             Compressible   +----+----------------+--------+-----------------+----------------+ GSV Right ankle   2.8 mm -----------------Patent                             Compressible   +----+----------------+--------+-----------------+----------------+ GSV Origin     4 mm  -----------------Patent                              Compressible   +----+----------------+--------+-----------------+----------------+ GSV Proximal left  3.4 mm -----------------Patent        thigh                   Compressible   +----+----------------+--------+-----------------+----------------+ GSV Mid left thigh 2.9 mm Branch      Patent                             Compressible   +----+----------------+--------+-----------------+----------------+ GSV Distal left   -------------------------Unable to       thigh                   adequately                           visualize.    +----+----------------+--------+-----------------+----------------+ GSV Left knee    -------------------------Unable to                            adequately                           visualize.    +----+----------------+--------+-----------------+----------------+ GSV Proximal left  2.6 mm -----------------Patent  calf                   Compressible   +----+----------------+--------+-----------------+----------------+ GSV Mid left calf  3.1 mm Multiple branchesPatent                             Compressible   +----+----------------+--------+-----------------+----------------+ GSV Left ankle   2.6 mm -----------------Patent                             Compressible   +----+----------------+--------+-----------------+----------------+  ------------------------------------------------------------------- Summary: Refer to table above for complete vein mapping observations. Prepared and Electronically Authenticated by  Leia Alf,  MD 2016-07-04T09:30:22     Assessment / Plan:   Symptomatic CAD with proximal LAD lesion I recommended proceeding with coronary artery bypass grafting. Risks and options of treatment of his coronary artery disease have been discussed with the patient and his wife in detail. I recommended proceeding with coronary artery bypass grafting because of the proximal LAD lesion and a total occlusion of the right.  The risks of surgery including death infection stroke myocardial infarction bleeding blood transfusion were all discussed in detail.    The goals risks and alternatives of the planned surgical procedure CABG  have been discussed with the patient in detail. The risks of the procedure including death, infection, stroke, myocardial infarction, bleeding, blood transfusion have all been discussed specifically.  I have quoted Bradley [name] a 3 % of perioperative mortality and a complication rate as high as 25 %. The patient's questions have been answered.Bradley [name] is willing  to proceed with the planned procedure.   Grace Isaac MD      [address] [place] [address] Office [phone]   Beeper [phone]  01/19/2015 7:20 AM

## 2015-01-19 NOTE — Transfer of Care (Signed)
Immediate Anesthesia Transfer of Care Note  Patient: Bradley [name]  Procedure(s) Performed: Procedure(s): CORONARY ARTERY BYPASS GRAFTING (CABG), with LIM to LAD, vein to left intermediate, vein to PD, vein to OM, using right greater saphenous vein via endovein harvest. (N/A) TRANSESOPHAGEAL ECHOCARDIOGRAM (TEE) (N/A) ENDOVEIN HARVEST OF GREATER SAPHENOUS VEIN (Right)  Patient Location: ICU  Anesthesia Type:General  Level of Consciousness: Patient remains intubated per anesthesia plan  Airway & Oxygen Therapy: Patient remains intubated per anesthesia plan and Patient placed on Ventilator (see vital sign flow sheet for setting)  Post-op Assessment: Report given to RN and Post -op Vital signs reviewed and stable  Post vital signs: Reviewed and stable  Last Vitals:  Filed Vitals:   01/19/15 0602  BP:   Pulse: 57  Temp:   Resp:     Complications: No apparent anesthesia complications

## 2015-01-19 NOTE — Progress Notes (Signed)
Lopressor held due to heart rate of 57

## 2015-01-19 NOTE — Brief Op Note (Signed)
      [address]       [place] [address]             [phone]    01/19/2015  1:18 PM  PATIENT:  Bradley [name]  74 y.o. male  PRE-OPERATIVE DIAGNOSIS:  CAD  POST-OPERATIVE DIAGNOSIS:  CAD  PROCEDURE:  Procedure(s): CORONARY ARTERY BYPASS GRAFTING (CABG) with 4 bypasses (N/A) TRANSESOPHAGEAL ECHOCARDIOGRAM (TEE) (N/A) LIMA to LAD, Vein to Intermediate, Vein to OM1, Vein to PDA With right right greater saphenous vein endo vein harvest  SURGEON:  Surgeon(s) and Role:    * Grace Isaac, MD - Primary  PHYSICIAN ASSISTANT: Suzzanne Cloud  ANESTHESIA:   general  EBL:  Total I/O In: 2900 [I.V.:2100; Blood:300; IV Piggyback:500] Out: 2575 [Urine:1000; Blood:1575]  BLOOD ADMINISTERED:none  DRAINS: 28  Chest Tube(s) in the left chest and (28) Blake drain(s) in the mediastinum   LOCAL MEDICATIONS USED:  NONE  SPECIMEN:  No Specimen  DISPOSITION OF SPECIMEN:  N/A  COUNTS:  YES   DICTATION: .Dragon Dictation  PLAN OF CARE: Admit to inpatient   PATIENT DISPOSITION:  ICU - intubated and hemodynamically stable.   Delay start of Pharmacological VTE agent (>24hrs) due to surgical blood loss or risk of bleeding: yes

## 2015-01-19 NOTE — Brief Op Note (Signed)
01/19/2015  11:33 AM  PATIENT:  [name]  74 y.o. male  PRE-OPERATIVE DIAGNOSIS:  CAD  POST-OPERATIVE DIAGNOSIS:  CAD  PROCEDURE:  CORONARY ARTERY BYPASS GRAFTING x 4 (LIMA-LAD, SVG-OM, SVG-Int, SVG-PD) ENDOSCOPIC GREATER SAPHENOUS VEIN HARVEST RIGHT LEG  SURGEON:  Grace Isaac, MD  ASSISTANT: Suzzanne Cloud, PA-C  ANESTHESIA:   general  PATIENT CONDITION:  PACU - hemodynamically stable.  PRE-OPERATIVE WEIGHT: 102.5 kg

## 2015-01-19 NOTE — Anesthesia Postprocedure Evaluation (Signed)
  Anesthesia Post-op Note  Patient: Bradley [name]  Procedure(s) Performed: Procedure(s): CORONARY ARTERY BYPASS GRAFTING (CABG), with LIM to LAD, vein to left intermediate, vein to PD, vein to OM, using right greater saphenous vein via endovein harvest. (N/A) TRANSESOPHAGEAL ECHOCARDIOGRAM (TEE) (N/A) ENDOVEIN HARVEST OF GREATER SAPHENOUS VEIN (Right)  Patient Location: ICU  Anesthesia Type:General  Level of Consciousness: sedated and Patient remains intubated per anesthesia plan  Airway and Oxygen Therapy: Patient remains intubated per anesthesia plan  Post-op Pain: none  Post-op Assessment: Post-op Vital signs reviewed              Post-op Vital Signs: Reviewed  Last Vitals:  Filed Vitals:   01/19/15 1400  BP:   Pulse: 90  Temp: 35.9 C  Resp: 12    Complications: No apparent anesthesia complications

## 2015-01-19 NOTE — Progress Notes (Signed)
  Echocardiogram Echocardiogram Transesophageal has been performed.  Donata Clay 01/19/2015, 8:44 AM

## 2015-01-19 NOTE — Progress Notes (Signed)
Dr. Cyndia Bent notified of ABG and respiratory mechanics. OK to extubate.

## 2015-01-19 NOTE — Procedures (Signed)
Extubation Procedure Note  Patient Details:   Name: Bradley [name] DOB: March 07, 1941 MRN: [id number]   Airway Documentation:     Evaluation  O2 sats: stable throughout Complications: No apparent complications Patient did tolerate procedure well. Bilateral Breath Sounds: Clear, Diminished   Yes   Pt. Was extubated to a 4L [place] without any complications, dyspnea or stridor noted. Pt. Achieved a goal of 1.3 on VC & -30 on NIF. Pt. Was instructed on IS x 5, highest goal achieved was 1250 mL.  Ravinder Hofland, [name] 01/19/2015, 6:10 PM

## 2015-01-20 ENCOUNTER — Inpatient Hospital Stay (HOSPITAL_COMMUNITY): Payer: Medicare Other

## 2015-01-20 ENCOUNTER — Encounter: Payer: Medicare Other | Admitting: Cardiothoracic Surgery

## 2015-01-20 ENCOUNTER — Encounter (HOSPITAL_COMMUNITY): Payer: Self-pay | Admitting: Cardiothoracic Surgery

## 2015-01-20 LAB — CBC
HEMATOCRIT: 32.8 % — AB (ref 39.0–52.0)
HEMATOCRIT: 33.5 % — AB (ref 39.0–52.0)
HEMOGLOBIN: 11 g/dL — AB (ref 13.0–17.0)
Hemoglobin: 11.1 g/dL — ABNORMAL LOW (ref 13.0–17.0)
MCH: 32.4 pg (ref 26.0–34.0)
MCH: 31.2 pg (ref 26.0–34.0)
MCHC: 33.8 g/dL (ref 30.0–36.0)
MCHC: 32.8 g/dL (ref 30.0–36.0)
MCV: 95.6 fL (ref 78.0–100.0)
MCV: 94.9 fL (ref 78.0–100.0)
Platelets: 147 10*3/uL — ABNORMAL LOW (ref 150–400)
Platelets: 157 10*3/uL (ref 150–400)
RBC: 3.43 MIL/uL — AB (ref 4.22–5.81)
RBC: 3.53 MIL/uL — AB (ref 4.22–5.81)
RDW: 13.5 % (ref 11.5–15.5)
RDW: 14.1 % (ref 11.5–15.5)
WBC: 12.2 10*3/uL — AB (ref 4.0–10.5)
WBC: 15.2 10*3/uL — AB (ref 4.0–10.5)

## 2015-01-20 LAB — CREATININE, SERUM
CREATININE: 1.25 mg/dL — AB (ref 0.61–1.24)
GFR calc Af Amer: >60 mL/min (ref >60)
GFR calc non Af Amer: 55 mL/min — ABNORMAL LOW (ref >60)

## 2015-01-20 LAB — GLUCOSE, CAPILLARY
Glucose-Capillary: 101 mg/dL — ABNORMAL HIGH (ref 65–99)
Glucose-Capillary: 111 mg/dL — ABNORMAL HIGH (ref 65–99)
Glucose-Capillary: 112 mg/dL — ABNORMAL HIGH (ref 65–99)
Glucose-Capillary: 113 mg/dL — ABNORMAL HIGH (ref 65–99)
Glucose-Capillary: 115 mg/dL — ABNORMAL HIGH (ref 65–99)
Glucose-Capillary: 118 mg/dL — ABNORMAL HIGH (ref 65–99)
Glucose-Capillary: 118 mg/dL — ABNORMAL HIGH (ref 65–99)
Glucose-Capillary: 122 mg/dL — ABNORMAL HIGH (ref 65–99)
Glucose-Capillary: 123 mg/dL — ABNORMAL HIGH (ref 65–99)
Glucose-Capillary: 125 mg/dL — ABNORMAL HIGH (ref 65–99)
Glucose-Capillary: 125 mg/dL — ABNORMAL HIGH (ref 65–99)
Glucose-Capillary: 129 mg/dL — ABNORMAL HIGH (ref 65–99)
Glucose-Capillary: 130 mg/dL — ABNORMAL HIGH (ref 65–99)
Glucose-Capillary: 132 mg/dL — ABNORMAL HIGH (ref 65–99)
Glucose-Capillary: 135 mg/dL — ABNORMAL HIGH (ref 65–99)
Glucose-Capillary: 138 mg/dL — ABNORMAL HIGH (ref 65–99)
Glucose-Capillary: 140 mg/dL — ABNORMAL HIGH (ref 65–99)
Glucose-Capillary: 142 mg/dL — ABNORMAL HIGH (ref 65–99)
Glucose-Capillary: 153 mg/dL — ABNORMAL HIGH (ref 65–99)
Glucose-Capillary: 157 mg/dL — ABNORMAL HIGH (ref 65–99)
Glucose-Capillary: 167 mg/dL — ABNORMAL HIGH (ref 65–99)
Glucose-Capillary: 99 mg/dL (ref 65–99)

## 2015-01-20 LAB — POCT I-STAT, CHEM 8
BUN: 21 mg/dL — ABNORMAL HIGH (ref 6–20)
Calcium, Ion: 1.28 mmol/L (ref 1.13–1.30)
Chloride: 105 mmol/L (ref 101–111)
Creatinine, Ser: 1.2 mg/dL (ref 0.61–1.24)
Glucose, Bld: 136 mg/dL — ABNORMAL HIGH (ref 65–99)
HCT: 33 % — ABNORMAL LOW (ref 39.0–52.0)
Hemoglobin: 11.2 g/dL — ABNORMAL LOW (ref 13.0–17.0)
Potassium: 4.3 mmol/L (ref 3.5–5.1)
Sodium: 138 mmol/L (ref 135–145)
TCO2: 20 mmol/L (ref 0–100)

## 2015-01-20 LAB — MAGNESIUM
MAGNESIUM: 2.4 mg/dL (ref 1.7–2.4)
Magnesium: 2.5 mg/dL — ABNORMAL HIGH (ref 1.7–2.4)

## 2015-01-20 LAB — BASIC METABOLIC PANEL
ANION GAP: 8 (ref 5–15)
BUN: 15 mg/dL (ref 6–20)
CO2: 23 mmol/L (ref 22–32)
Calcium: 8.2 mg/dL — ABNORMAL LOW (ref 8.9–10.3)
Chloride: 107 mmol/L (ref 101–111)
Creatinine, Ser: 1.25 mg/dL — ABNORMAL HIGH (ref 0.61–1.24)
GFR calc Af Amer: >60 mL/min (ref >60)
GFR calc non Af Amer: 55 mL/min — ABNORMAL LOW (ref >60)
GLUCOSE: 137 mg/dL — AB (ref 65–99)
POTASSIUM: 4.4 mmol/L (ref 3.5–5.1)
SODIUM: 138 mmol/L (ref 135–145)

## 2015-01-20 MED ORDER — FUROSEMIDE 10 MG/ML IJ SOLN
40.0000 mg | Freq: Once | INTRAMUSCULAR | Status: AC
  Administered 2015-01-20: 40 mg via INTRAVENOUS
  Filled 2015-01-20: qty 4

## 2015-01-20 MED ORDER — CETYLPYRIDINIUM CHLORIDE 0.05 % MT LIQD
7.0000 mL | Freq: Two times a day (BID) | OROMUCOSAL | Status: DC
  Administered 2015-01-20 – 2015-01-29 (×9): 7 mL via OROMUCOSAL

## 2015-01-20 MED ORDER — INSULIN DETEMIR 100 UNIT/ML ~~LOC~~ SOLN
10.0000 [IU] | Freq: Once | SUBCUTANEOUS | Status: AC
  Administered 2015-01-20: 10 [IU] via SUBCUTANEOUS
  Filled 2015-01-20: qty 0.1

## 2015-01-20 MED ORDER — PROMETHAZINE HCL 25 MG/ML IJ SOLN: 12.5000 mg | Freq: Four times a day (QID) | INTRAMUSCULAR | Status: DC | PRN

## 2015-01-20 MED ORDER — METOCLOPRAMIDE HCL 5 MG/ML IJ SOLN
10.0000 mg | Freq: Four times a day (QID) | INTRAMUSCULAR | Status: AC
  Administered 2015-01-20 (×4): 10 mg via INTRAVENOUS
  Filled 2015-01-20 (×4): qty 2

## 2015-01-20 MED ORDER — INSULIN ASPART 100 UNIT/ML ~~LOC~~ SOLN
0.0000 [IU] | SUBCUTANEOUS | Status: DC
  Administered 2015-01-20: 2 [IU] via SUBCUTANEOUS

## 2015-01-20 MED FILL — Sodium Chloride IV Soln 0.9%: INTRAVENOUS | Qty: 2000 | Status: AC

## 2015-01-20 MED FILL — Mannitol IV Soln 20%: INTRAVENOUS | Qty: 500 | Status: AC

## 2015-01-20 MED FILL — Electrolyte-R (PH 7.4) Solution: INTRAVENOUS | Qty: 5000 | Status: AC

## 2015-01-20 MED FILL — Heparin Sodium (Porcine) Inj 1000 Unit/ML: INTRAMUSCULAR | Qty: 10 | Status: AC

## 2015-01-20 MED FILL — Sodium Bicarbonate IV Soln 8.4%: INTRAVENOUS | Qty: 50 | Status: AC

## 2015-01-20 MED FILL — Lidocaine HCl IV Inj 20 MG/ML: INTRAVENOUS | Qty: 5 | Status: AC

## 2015-01-20 NOTE — Progress Notes (Addendum)
TCTS DAILY ICU PROGRESS NOTE                   [address]            [place] [address]          [phone]   1 Day Post-Op Procedure(s) (LRB): CORONARY ARTERY BYPASS GRAFTING (CABG), with LIM to LAD, vein to left intermediate, vein to PD, vein to OM, using right greater saphenous vein via endovein harvest. (N/A) TRANSESOPHAGEAL ECHOCARDIOGRAM (TEE) (N/A) ENDOVEIN HARVEST OF GREATER SAPHENOUS VEIN (Right)  Total Length of Stay:  LOS: 1 day   Subjective: Nauseated this am, "dry heaves all night".    Objective: Vital signs in last 24 hours: Temp:  [96.6 F (35.9 C)-100.9 F (38.3 C)] 99.3 F (37.4 C) (07/06 0715) Pulse Rate:  [89-91] 90 (07/06 0715) Cardiac Rhythm:  [-] Atrial paced (07/06 0734) Resp:  [10-26] 18 (07/06 0715) BP: (90-132)/(54-73) 112/67 mmHg (07/06 0700) SpO2:  [93 %-100 %] 97 % (07/06 0715) Arterial Line BP: (86-160)/(39-67) 122/50 mmHg (07/06 0715) FiO2 (%):  [40 %-50 %] 40 % (07/05 1726) Weight:  [234 lb 12.6 oz (106.5 kg)] 234 lb 12.6 oz (106.5 kg) (07/06 0500)  Filed Weights   01/19/15 0548 01/20/15 0500  Weight: 226 lb (102.513 kg) 234 lb 12.6 oz (106.5 kg)  PRE-OPERATIVE WEIGHT: 102.5 kg  Weight change: 8 lb 12.6 oz (3.987 kg)   Hemodynamic parameters for last 24 hours: PAP: (20-38)/(9-24) 24/10 mmHg CO:  [4.7 L/min-7.3 L/min] 7.3 L/min CI:  [2.1 L/min/m2-3.2 L/min/m2] 3.2 L/min/m2  Intake/Output from previous day: 07/05 0701 - 07/06 0700 In: 7003.3 [I.V.:4464.3; Blood:625; IV Piggyback:1914] Out: 5050 [Urine:2765; Blood:1575; Chest Tube:710]  CBGs 111-110-112-101-115-118-122-118-129-99-125-125     Current Meds: Scheduled Meds: . acetaminophen  1,000 mg Oral 4 times per day   Or  . acetaminophen (TYLENOL) oral liquid 160 mg/5 mL  1,000 mg Per Tube 4 times per day  . antiseptic oral rinse  7 mL Mouth Rinse QID  . aspirin EC  325 mg Oral Daily   Or  . aspirin  324 mg Per Tube Daily  . atorvastatin  20 mg Oral Daily   . bisacodyl  10 mg Oral Daily   Or  . bisacodyl  10 mg Rectal Daily  . cefUROXime (ZINACEF)  IV  1.5 g Intravenous Q12H  . chlorhexidine  15 mL Mouth/Throat BID  . docusate sodium  200 mg Oral Daily  . dutasteride  0.5 mg Oral QHS  . famotidine (PEPCID) IV  20 mg Intravenous Q12H  . insulin regular  0-10 Units Intravenous TID WC  . latanoprost  1 drop Both Eyes QHS  . metoprolol tartrate  12.5 mg Oral BID   Or  . metoprolol tartrate  12.5 mg Per Tube BID  . [START ON 01/21/2015] pantoprazole  40 mg Oral Daily  . sodium chloride  3 mL Intravenous Q12H  . timolol  1 drop Both Eyes Daily   Continuous Infusions: . sodium chloride 20 mL/hr at 01/20/15 0400  . sodium chloride    . sodium chloride    . dexmedetomidine Stopped (01/19/15 1704)  . insulin (NOVOLIN-R) infusion 0.9 Units/hr (01/20/15 0700)  . lactated ringers 20 mL/hr at 01/20/15 0400  . lactated ringers    . nitroGLYCERIN    . phenylephrine (NEO-SYNEPHRINE) Adult infusion Stopped (01/20/15 0545)   PRN Meds:.sodium chloride, albumin human, metoprolol, midazolam, morphine injection, ondansetron (ZOFRAN) IV, oxyCODONE, sodium chloride, traMADol  Physical Exam: General appearance: alert, cooperative and no distress Heart: RRR, a-paced at 90 Lungs: diminished breath sounds bibasilar Abdomen: soft, non-tender; bowel sounds normal; no masses,  no organomegaly Extremities: Mild LE edema, some ecchymosis of right leg Wound: Dressed and dry  Lab Results: CBC: Recent Labs  01/19/15 1900 01/20/15 0407  WBC 9.9 12.2*  HGB 11.7* 11.1*  HCT 34.9* 32.8*  PLT 129* 147*   BMET:  Recent Labs  01/19/15 1855 01/19/15 1900 01/20/15 0407  NA 140  --  138  K 4.2  --  4.4  CL 105  --  107  CO2  --   --  23  GLUCOSE 119*  --  137*  BUN 16  --  15  CREATININE 1.00 1.15 1.25*  CALCIUM  --   --  8.2*    PT/INR:  Recent Labs  01/19/15 1355  LABPROT 17.8*  INR 1.46   Radiology: Dg Chest Port 1 View  01/20/2015    CLINICAL DATA:  Coronary artery disease ; status post coronary artery bypass grafting  EXAM: PORTABLE CHEST - 1 VIEW  COMPARISON:  January 19, 2015  FINDINGS: Endotracheal tube and nasogastric tube have been removed. Swan-Ganz catheter tip is in the distal right main pulmonary artery, stable. There is a mediastinal drain and a left chest tube. There is no appreciable pneumothorax. There is mild left base atelectatic change. Lungs elsewhere clear. Heart is upper normal in size with pulmonary vascularity within normal limits. No adenopathy.  IMPRESSION: Tube and catheter positions as described without pneumothorax. Mild left base atelectasis. No edema or consolidation.   Electronically Signed   By: Lowella Grip III M.D.   On: 01/20/2015 07:19   Dg Chest Port 1 View  01/19/2015   CLINICAL DATA:  74 year old male status post CABG. Initial encounter.  EXAM: PORTABLE CHEST - 1 VIEW  COMPARISON:  Preoperative chest radiographs 01/15/2015  FINDINGS: Portable AP semi upright view at 1348 hrs.  Endotracheal tube tip at the level the clavicles. Enteric tube courses to the left upper quadrant, tip not included. Left chest tube in place. Mediastinal drain in place. Right IJ approach Swan-Ganz catheter, tip at the proximal right lower lobe pulmonary artery level.  Sequelae of CABG. Lower lung volumes. No pneumothorax or acute pulmonary edema. Small left pleural effusion suspected. Other left lung base opacity favored due to atelectasis. Stable cardiac size and mediastinal contours.  IMPRESSION: 1. Lines and tubes appear appropriately placed. 2. No pneumothorax or edema. Left lung atelectasis and suspected small pleural effusion.   Electronically Signed   By: Genevie Ann M.D.   On: 01/19/2015 14:03     Assessment/Plan: S/P Procedure(s) (LRB): CORONARY ARTERY BYPASS GRAFTING (CABG), with LIM to LAD, vein to left intermediate, vein to PD, vein to OM, using right greater saphenous vein via endovein harvest.  (N/A) TRANSESOPHAGEAL ECHOCARDIOGRAM (TEE) (N/A) ENDOVEIN HARVEST OF GREATER SAPHENOUS VEIN (Right)  CV- Off all drips, BPs stable. A-paced at 67.   GI- nausea, will continue prn meds, start scheduled Reglan and monitor closely. Continue IVF for now.  Renal- Cr up slightly this am, UOP excellent overnight. Will monitor.  Expected postop blood loss anemia- H/H stable.  Mobilize as tolerated, d/c lines and tubes as able, routine POD #1 progression.   COLLINS,GINA H 01/20/2015 7:40 AM  Nausea this am I have seen and examined [name] and agree with the above assessment  and plan.  Grace Isaac MD Beeper [phone] Office [phone] 01/20/2015  11:04 AM

## 2015-01-20 NOTE — Progress Notes (Signed)
      [address]       [place], [address]             [phone]      Feels better this afternoon, nausea improved  BP 112/63 mmHg  Pulse 81  Temp(Src) 98.3 F (36.8 C) (Oral)  Resp 20  Ht 6\' 3"  (1.905 m)  Wt 234 lb 12.6 oz (106.5 kg)  BMI 29.35 kg/m2  SpO2 95%   Intake/Output Summary (Last 24 hours) at 01/20/15 1758 Last data filed at 01/20/15 1700  Gross per 24 hour  Intake 1888.11 ml  Output   2000 ml  Net -111.89 ml   Creatinine 1.25 from 1.20 Hct= 33  Doing well POD # 1  Leronda Lewers C. Roxan Hockey, MD [facility] [phone]

## 2015-01-21 ENCOUNTER — Inpatient Hospital Stay (HOSPITAL_COMMUNITY): Payer: Medicare Other

## 2015-01-21 LAB — CBC
HCT: 32.1 % — ABNORMAL LOW (ref 39.0–52.0)
Hemoglobin: 10.5 g/dL — ABNORMAL LOW (ref 13.0–17.0)
MCH: 31.3 pg (ref 26.0–34.0)
MCHC: 32.7 g/dL (ref 30.0–36.0)
MCV: 95.8 fL (ref 78.0–100.0)
Platelets: 152 10*3/uL (ref 150–400)
RBC: 3.35 MIL/uL — ABNORMAL LOW (ref 4.22–5.81)
RDW: 14.1 % (ref 11.5–15.5)
WBC: 15 10*3/uL — ABNORMAL HIGH (ref 4.0–10.5)

## 2015-01-21 LAB — GLUCOSE, CAPILLARY
Glucose-Capillary: 104 mg/dL — ABNORMAL HIGH (ref 65–99)
Glucose-Capillary: 107 mg/dL — ABNORMAL HIGH (ref 65–99)
Glucose-Capillary: 109 mg/dL — ABNORMAL HIGH (ref 65–99)
Glucose-Capillary: 114 mg/dL — ABNORMAL HIGH (ref 65–99)
Glucose-Capillary: 116 mg/dL — ABNORMAL HIGH (ref 65–99)

## 2015-01-21 LAB — BASIC METABOLIC PANEL
Anion gap: 6 (ref 5–15)
BUN: 19 mg/dL (ref 6–20)
CO2: 26 mmol/L (ref 22–32)
Calcium: 8.7 mg/dL — ABNORMAL LOW (ref 8.9–10.3)
Chloride: 106 mmol/L (ref 101–111)
Creatinine, Ser: 1.23 mg/dL (ref 0.61–1.24)
GFR calc Af Amer: >60 mL/min (ref >60)
GFR calc non Af Amer: 56 mL/min — ABNORMAL LOW (ref >60)
Glucose, Bld: 107 mg/dL — ABNORMAL HIGH (ref 65–99)
Potassium: 3.9 mmol/L (ref 3.5–5.1)
Sodium: 138 mmol/L (ref 135–145)

## 2015-01-21 MED ORDER — BISACODYL 10 MG RE SUPP: 10.0000 mg | Freq: Every day | RECTAL | Status: DC | PRN

## 2015-01-21 MED ORDER — SODIUM CHLORIDE 0.9 % IV SOLN: 250.0000 mL | INTRAVENOUS | Status: DC | PRN

## 2015-01-21 MED ORDER — MOVING RIGHT ALONG BOOK
Freq: Once | Status: AC
Start: 2015-01-21 — End: 2015-01-21
  Administered 2015-01-21: 09:00:00
  Filled 2015-01-21: qty 1

## 2015-01-21 MED ORDER — ASPIRIN EC 325 MG PO TBEC
325.0000 mg | DELAYED_RELEASE_TABLET | Freq: Every day | ORAL | Status: DC
  Administered 2015-01-21 – 2015-01-25 (×5): 325 mg via ORAL
  Filled 2015-01-21 (×5): qty 1

## 2015-01-21 MED ORDER — DOCUSATE SODIUM 100 MG PO CAPS
200.0000 mg | ORAL_CAPSULE | Freq: Every day | ORAL | Status: DC
  Administered 2015-01-21 – 2015-01-29 (×6): 200 mg via ORAL
  Filled 2015-01-21 (×9): qty 2

## 2015-01-21 MED ORDER — SODIUM CHLORIDE 0.9 % IJ SOLN
3.0000 mL | Freq: Two times a day (BID) | INTRAMUSCULAR | Status: DC
  Administered 2015-01-21 – 2015-01-29 (×13): 3 mL via INTRAVENOUS

## 2015-01-21 MED ORDER — INSULIN ASPART 100 UNIT/ML ~~LOC~~ SOLN: 0.0000 [IU] | Freq: Three times a day (TID) | SUBCUTANEOUS | Status: DC

## 2015-01-21 MED ORDER — SODIUM CHLORIDE 0.9 % IJ SOLN: 3.0000 mL | INTRAMUSCULAR | Status: DC | PRN

## 2015-01-21 MED ORDER — PANTOPRAZOLE SODIUM 40 MG PO TBEC
40.0000 mg | DELAYED_RELEASE_TABLET | Freq: Every day | ORAL | Status: DC
Start: 2015-01-21 — End: 2015-01-30
  Administered 2015-01-21 – 2015-01-30 (×9): 40 mg via ORAL
  Filled 2015-01-21 (×8): qty 1

## 2015-01-21 MED ORDER — BISACODYL 5 MG PO TBEC: 10.0000 mg | DELAYED_RELEASE_TABLET | Freq: Every day | ORAL | Status: DC | PRN

## 2015-01-21 MED ORDER — TRAMADOL HCL 50 MG PO TABS
50.0000 mg | ORAL_TABLET | ORAL | Status: DC | PRN
  Administered 2015-01-21: 100 mg via ORAL
  Administered 2015-01-21 – 2015-01-28 (×4): 50 mg via ORAL
  Filled 2015-01-21: qty 1
  Filled 2015-01-21: qty 2
  Filled 2015-01-21 (×3): qty 1

## 2015-01-21 MED ORDER — METOPROLOL TARTRATE 12.5 MG HALF TABLET
12.5000 mg | ORAL_TABLET | Freq: Two times a day (BID) | ORAL | Status: DC
  Administered 2015-01-22 – 2015-01-24 (×5): 12.5 mg via ORAL
  Filled 2015-01-21 (×9): qty 1

## 2015-01-21 MED ORDER — METOCLOPRAMIDE HCL 5 MG/ML IJ SOLN
10.0000 mg | Freq: Four times a day (QID) | INTRAMUSCULAR | Status: AC
  Administered 2015-01-21 – 2015-01-22 (×4): 10 mg via INTRAVENOUS
  Filled 2015-01-21 (×4): qty 2

## 2015-01-21 NOTE — Progress Notes (Signed)
Pt transferred to 2W23 from 2S; wife at bedside; pt assisted to chair after ambulation from 2S to 2W; pt denies pain; VSS; will cont. To monitor.

## 2015-01-21 NOTE — Progress Notes (Signed)
CARDIAC REHAB PHASE I   PRE:  Rate/Rhythm: 68 SR  BP:  Supine:   Sitting: 110/59  Standing:    SaO2: 97% 2, 90% RA  MODE:  Ambulation: 300 ft   POST:  Rate/Rhythm: 83 SR  BP:  Supine:   Sitting: 128/60  Standing:    SaO2: 94%2L 1315-1402 Pt walked 300 ft on 2L with rolling walker and asst x 1 with steady gait. Tolerated well. To recliner with call bell. Tried RA in room but to 90% just sitting. Encouraged IS. Left on 2L.   Graylon Good, RN BSN  01/21/2015 2:00 PM

## 2015-01-21 NOTE — Op Note (Signed)
Bradley [name], STAY NO.:  [id number]  MEDICAL RECORD NO.:  [id number]  LOCATION:  2S01C                        FACILITY:  [facility]  PHYSICIAN:  Lanelle Bal, MD    DATE OF BIRTH:  29-Sep-1940  DATE OF PROCEDURE:  01/19/2015 DATE OF DISCHARGE:                              OPERATIVE REPORT   PREOPERATIVE DIAGNOSIS:  New onset of unstable angina.  POSTOPERATIVE DIAGNOSIS:  New onset of unstable angina.  SURGICAL PROCEDURE:  Coronary artery bypass grafting x4 with the left internal mammary to the left anterior descending coronary artery, reverse saphenous vein graft to the intermediate coronary artery, reverse saphenous vein graft to the first obtuse marginal coronary artery, reverse saphenous vein graft to the posterior descending coronary artery with right thigh and calf, greater saphenous vein harvesting endoscopically.  SURGEON:  Lanelle Bal, M.D.  FIRST ASSISTANT:  Suzzanne Cloud, PA  BRIEF HISTORY:  The patient is a 74 year old male, who presented to [facility] with increasing symptoms of substernal chest pain with exertion over several weeks.  The patient noted that this had increased in frequency in the level of exercise and in addition the discomfort radiated into his left arm.  He had undergone cardiac catheterization in  by Dr. Nehemiah Massed, which demonstrated a high-grade proximal LAD lesion of at least 80% and a small intermediate branch with 80% obstruction, a moderate-size circumflex system with the eccentric proximal plaque on some views approaching 70% and a chronically occluded right coronary artery with collateral distal filling from the left system and from bridging collaterals.  Because of the patient's increasing symptoms, coronary artery bypass grafting was recommended. The patient agreed and signed informed consent.  DESCRIPTION OF PROCEDURE:  With Swan-Ganz and arterial line monitors in place, the patient underwent  general endotracheal anesthesia without incident.  Skin of chest and legs was prepped with Betadine and draped in usual sterile manner.  The patient had bilateral varicosities.  Preop vein mapping indicated the vein from the right leg was of better quality and more complete than the left.  We proceeded with endoscopic vein harvesting of the greater saphenous vein on the right from the thigh and calf, the vein was of good quality and caliber.  Median sternotomy was performed.  Left internal mammary artery was dissected down as pedicle graft.  The distal artery was divided and had good free flow.  The pericardium was opened.  Overall, ventricular function appeared preserved.  The patient was systemically heparinized.  The ascending aorta was cannulated.  The right atrium was cannulated and aortic root vent cardioplegia needle was introduced into the ascending aorta.  The patient was placed on cardiopulmonary bypass 2.4 L/min/m2.  Sites of anastomosis were selected and dissected out of the epicardium.  The patient's body temperature was cooled to 32 degrees.  An aortic cross- clamp applied was applied and 500 mL of cold blood potassium cardioplegia was administered.  Diastolic arrest of the heart. Myocardial septal temperature was monitored throughout the cross-clamp. Attention was turned first to the posterior descending coronary artery, which was opened and admitted a 1.5 mm probe distally.  Using a running 7-0 Prolene, distal anastomosis was performed with a segment  of reverse saphenous vein graft.  The heart was then elevated and the first obtuse marginal artery, which was opened and was 1.5-1.7 mm in size.  Using a running 7-0 Prolene, distal anastomosis was performed with a second reverse saphenous vein graft.  On the lateral wall was a smaller intermediate branch, but suitable for bypass with 80% proximal stenosis and the vessel was approximately 1.2 mm in size.  Using a running  7-0 Prolene distal anastomosis was performed with a segment of reverse saphenous vein graft.  In the midportion of the left anterior descending coronary artery, the vessel was opened, admitted to 1.5 mm probe distally and proximally.  Using a running 8-0 Prolene, left internal mammary artery was anastomosed to the left anterior descending coronary artery.  With the cross-clamps still in place, 3 punch aortotomies were performed. each of 3 vein grafts anastomosed to the ascending aorta. Air was evacuated from the grafts and ascending aorta.  Prior to completion of the anastomosis, the aortic cross-clamp was removed with a total cross-clamp time of 95 minutes.  The patient spontaneously converted to a sinus rhythm.  He required atrial pacing to increase rate.  Sites of anastomosis were inspected and free of bleeding.  He was then rewarmed to 37 degrees, ventilated and weaned from cardiopulmonary bypass without difficulty.  He remained hemodynamically stable and was decannulated in usual fashion.  Protamine sulfate was administered. With operative field hemostatic, a left chest tube and a Blake mediastinal drain were left in place.  Pericardium was loosely reapproximated.  Sternum was closed with #6 stainless steel wire. Fascia closed with interrupted 0 Vicryl, running 3-0 Vicryl and subcutaneous tissue, and 3-0 subcuticular stitch in skin edges.  Dry dressings were applied.  Sponge and needle count was reported as correct at completion of procedure.  The patient tolerated the procedure without obvious complication.  He was transferred to Surgical Intensive Care Unit for further postoperative care.  Total pump time was 119 minutes. The patient did not require any blood bank blood products during the operative procedure.     Lanelle Bal, MD     EG/MEDQ  D:  01/21/2015  T:  01/21/2015  Job:  [id number]  cc:   Serafina Royals

## 2015-01-21 NOTE — Progress Notes (Addendum)
TCTS DAILY ICU PROGRESS NOTE                   [address]            [place] [address]          [phone]   2 Days Post-Op Procedure(s) (LRB): CORONARY ARTERY BYPASS GRAFTING (CABG), with LIM to LAD, vein to left intermediate, vein to PD, vein to OM, using right greater saphenous vein via endovein harvest. (N/A) TRANSESOPHAGEAL ECHOCARDIOGRAM (TEE) (N/A) ENDOVEIN HARVEST OF GREATER SAPHENOUS VEIN (Right)  Total Length of Stay:  LOS: 2 days   Subjective: OOB in chair.  Feels much better today.  Nausea improved significantly. Walked in hall without difficulty.   Objective: Vital signs in last 24 hours: Temp:  [98.3 F (36.8 C)-99.5 F (37.5 C)] 98.8 F (37.1 C) (07/07 0400) Pulse Rate:  [69-92] 69 (07/07 0700) Cardiac Rhythm:  [-] Normal sinus rhythm (07/07 0400) Resp:  [11-20] 12 (07/07 0700) BP: (96-136)/(47-74) 104/47 mmHg (07/07 0700) SpO2:  [91 %-95 %] 93 % (07/07 0700) Arterial Line BP: (98-150)/(33-54) 139/42 mmHg (07/06 1600) Weight:  [228 lb 3.2 oz (103.511 kg)] 228 lb 3.2 oz (103.511 kg) (07/07 0500)  Filed Weights   01/19/15 0548 01/20/15 0500 01/21/15 0500  Weight: 226 lb (102.513 kg) 234 lb 12.6 oz (106.5 kg) 228 lb 3.2 oz (103.511 kg)  PRE-OPERATIVE WEIGHT: 102.5 kg  Weight change: -6 lb 9.4 oz (-2.989 kg)   Hemodynamic parameters for last 24 hours: PAP: (25-28)/(10-12) 27/10 mmHg CO:  [5.3 L/min-8.8 L/min] 8.8 L/min CI:  [2.3 L/min/m2-3.8 L/min/m2] 3.8 L/min/m2  Intake/Output from previous day: 07/06 0701 - 07/07 0700 In: 671.4 [I.V.:571.4; IV Piggyback:100] Out: 2185 [Urine:2035; Chest Tube:150]  CBGs 136-135-112-104-107  Current Meds: Scheduled Meds: . acetaminophen  1,000 mg Oral 4 times per day   Or  . acetaminophen (TYLENOL) oral liquid 160 mg/5 mL  1,000 mg Per Tube 4 times per day  . antiseptic oral rinse  7 mL Mouth Rinse BID  . aspirin EC  325 mg Oral Daily   Or  . aspirin  324 mg Per Tube Daily  . atorvastatin   20 mg Oral Daily  . bisacodyl  10 mg Oral Daily   Or  . bisacodyl  10 mg Rectal Daily  . cefUROXime (ZINACEF)  IV  1.5 g Intravenous Q12H  . docusate sodium  200 mg Oral Daily  . dutasteride  0.5 mg Oral QHS  . insulin aspart  0-24 Units Subcutaneous 6 times per day  . latanoprost  1 drop Both Eyes QHS  . metoprolol tartrate  12.5 mg Oral BID   Or  . metoprolol tartrate  12.5 mg Per Tube BID  . pantoprazole  40 mg Oral Daily  . sodium chloride  3 mL Intravenous Q12H  . timolol  1 drop Both Eyes Daily   Continuous Infusions: . sodium chloride Stopped (01/20/15 1300)  . sodium chloride    . sodium chloride    . dexmedetomidine Stopped (01/19/15 1704)  . insulin (NOVOLIN-R) infusion Stopped (01/20/15 1408)  . lactated ringers 20 mL/hr at 01/20/15 0400  . lactated ringers    . nitroGLYCERIN Stopped (01/19/15 1330)  . phenylephrine (NEO-SYNEPHRINE) Adult infusion Stopped (01/20/15 0545)   PRN Meds:.sodium chloride, metoprolol, midazolam, morphine injection, ondansetron (ZOFRAN) IV, promethazine, sodium chloride, traMADol  Physical Exam: General appearance: alert, cooperative and no distress Heart: regular rate and rhythm Lungs: diminished breath sounds bibasilar Abdomen: soft,  non-tender; bowel sounds normal; no masses,  no organomegaly Extremities: no edema, redness or tenderness in the calves or thighs Wound: Dressed and dry    Lab Results: CBC: Recent Labs  01/20/15 1613 01/21/15 0509  WBC 15.2* 15.0*  HGB 11.0* 10.5*  HCT 33.5* 32.1*  PLT 157 152   BMET:  Recent Labs  01/20/15 0407 01/20/15 1607 01/20/15 1613 01/21/15 0509  NA 138 138  --  138  K 4.4 4.3  --  3.9  CL 107 105  --  106  CO2 23  --   --  26  GLUCOSE 137* 136*  --  107*  BUN 15 21*  --  19  CREATININE 1.25* 1.20 1.25* 1.23  CALCIUM 8.2*  --   --  8.7*    PT/INR:  Recent Labs  01/19/15 1355  LABPROT 17.8*  INR 1.46   Radiology:  CXR (01/21/2015) FINDINGS: There has been interval  removal of the left chest tube and mediastinal drain. No definite pneumothorax is observed. There is increased density in the retrocardiac region consistent with atelectasis. This is not greatly changed from yesterday's study. Traces of pleural fluid on the left is suspected. The right lung is adequately inflated. There is no focal infiltrate. The Swan-Ganz catheter has been removed. The right internal jugular Cordis sheath persists. The cardiac silhouette remains enlarged. The pulmonary vascularity is not engorged.  IMPRESSION: Persistent left lower lobe atelectasis and trace pleural effusion without evidence of a pneumothorax following chest tube and mediastinal drain removal. There is stable enlargement cardiac silhouette without significant pulmonary vascular congestion.   Assessment/Plan: S/P Procedure(s) (LRB): CORONARY ARTERY BYPASS GRAFTING (CABG), with LIM to LAD, vein to left intermediate, vein to PD, vein to OM, using right greater saphenous vein via endovein harvest. (N/A) TRANSESOPHAGEAL ECHOCARDIOGRAM (TEE) (N/A) ENDOVEIN HARVEST OF GREATER SAPHENOUS VEIN (Right)  CV- BPs stable. Maintaining SR.  Continue low dose beta blocker.  GI- nausea resolved.  Passing flatus. Advance diet as tolerated and decrease IVF.  Continue Reglan.  Renal- Cr stable, continue to monitor.  Expected postop blood loss anemia- H/H stable.  Leukocytosis, no fever. Likely atelectasis.  Encouraged increased IS/pulm toilet. Continue to watch.  He looks a lot better today. Hopefully can d/c Foley and tx stepdown.  [place] H 01/21/2015 7:42 AM Expected Acute  Blood - loss Anemia Feels better today Has history of prostate surgery in past, d/c foley today now ambulating well To stepdown I have seen and examined [name] and agree with the above assessment  and plan.  Grace Isaac MD Beeper [phone] Office [phone] 01/21/2015 8:13 AM

## 2015-01-22 ENCOUNTER — Inpatient Hospital Stay (HOSPITAL_COMMUNITY): Payer: Medicare Other

## 2015-01-22 LAB — CBC
HCT: 32.1 % — ABNORMAL LOW (ref 39.0–52.0)
Hemoglobin: 10.6 g/dL — ABNORMAL LOW (ref 13.0–17.0)
MCH: 31.8 pg (ref 26.0–34.0)
MCHC: 33 g/dL (ref 30.0–36.0)
MCV: 96.4 fL (ref 78.0–100.0)
Platelets: 162 10*3/uL (ref 150–400)
RBC: 3.33 MIL/uL — ABNORMAL LOW (ref 4.22–5.81)
RDW: 14 % (ref 11.5–15.5)
WBC: 14.1 10*3/uL — ABNORMAL HIGH (ref 4.0–10.5)

## 2015-01-22 LAB — BASIC METABOLIC PANEL
Anion gap: 7 (ref 5–15)
BUN: 24 mg/dL — ABNORMAL HIGH (ref 6–20)
CO2: 27 mmol/L (ref 22–32)
Calcium: 8.8 mg/dL — ABNORMAL LOW (ref 8.9–10.3)
Chloride: 106 mmol/L (ref 101–111)
Creatinine, Ser: 1.16 mg/dL (ref 0.61–1.24)
GFR calc Af Amer: >60 mL/min (ref >60)
GFR calc non Af Amer: 60 mL/min — ABNORMAL LOW (ref >60)
Glucose, Bld: 130 mg/dL — ABNORMAL HIGH (ref 65–99)
Potassium: 4 mmol/L (ref 3.5–5.1)
Sodium: 140 mmol/L (ref 135–145)

## 2015-01-22 LAB — GLUCOSE, CAPILLARY
Glucose-Capillary: 119 mg/dL — ABNORMAL HIGH (ref 65–99)
Glucose-Capillary: 130 mg/dL — ABNORMAL HIGH (ref 65–99)
Glucose-Capillary: 117 mg/dL — ABNORMAL HIGH (ref 65–99)

## 2015-01-22 MED ORDER — LACTULOSE 10 GM/15ML PO SOLN
20.0000 g | Freq: Every day | ORAL | Status: DC | PRN
  Administered 2015-01-23: 20 g via ORAL
  Filled 2015-01-22 (×2): qty 30

## 2015-01-22 MED ORDER — HYDROCORTISONE 1 % EX CREA
TOPICAL_CREAM | CUTANEOUS | Status: DC | PRN
  Administered 2015-01-27: 10:00:00 via TOPICAL
  Filled 2015-01-22: qty 28

## 2015-01-22 MED ORDER — PROMETHAZINE HCL 25 MG/ML IJ SOLN
12.5000 mg | Freq: Four times a day (QID) | INTRAMUSCULAR | Status: DC | PRN
  Administered 2015-01-22: 12.5 mg via INTRAVENOUS
  Filled 2015-01-22: qty 1

## 2015-01-22 MED ORDER — ONDANSETRON HCL 4 MG/2ML IJ SOLN
4.0000 mg | Freq: Three times a day (TID) | INTRAMUSCULAR | Status: DC | PRN
  Administered 2015-01-22 – 2015-01-25 (×2): 4 mg via INTRAVENOUS
  Filled 2015-01-22 (×2): qty 2

## 2015-01-22 MED FILL — Magnesium Sulfate Inj 50%: INTRAMUSCULAR | Qty: 10 | Status: AC

## 2015-01-22 MED FILL — Heparin Sodium (Porcine) Inj 1000 Unit/ML: INTRAMUSCULAR | Qty: 2500 | Status: AC

## 2015-01-22 MED FILL — Potassium Chloride Inj 2 mEq/ML: INTRAVENOUS | Qty: 40 | Status: AC

## 2015-01-22 MED FILL — Heparin Sodium (Porcine) Inj 1000 Unit/ML: INTRAMUSCULAR | Qty: 30 | Status: AC

## 2015-01-22 NOTE — Progress Notes (Signed)
Utilization review completed.  

## 2015-01-22 NOTE — Care Management (Signed)
Important Message  Patient Details  Name: Bradley [name] MRN: [id number] Date of Birth: [birth year]   Medicare Important Message Given:  Yes-second notification given    Nathen May 01/22/2015, 9:57 AM

## 2015-01-22 NOTE — Discharge Summary (Signed)
[address]       [place] [address]             [phone]              Discharge Summary  Name: Bradley [name] DOB: [birth year] 74 y.o. MRN: [id number]   Admission Date: 01/19/2015 Discharge Date: 01/30/2015   Admitting Diagnosis:  Patient Active Problem List   Diagnosis Date Noted  . 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 Stable angina 01/07/2015  . Arteriosclerosis of coronary artery 12/10/2014  . Benign essential HTN 06/05/2014  . Chest pain 01/12/2014  . Angina pectoris 10/09/2013  . 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 Benign prostatic hyperplasia with urinary obstruction 04/01/2012  . 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 Benign neoplasm of cerebral meninges 04/24/2011   Discharge Diagnosis:  Patient Active Problem List   Diagnosis Date Noted  . Atrial fibrillation 01/30/2015  . 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 Stable angina 01/07/2015  . Arteriosclerosis of coronary artery 12/10/2014  . Benign essential HTN 06/05/2014  . Chest pain 01/12/2014  . Angina pectoris 10/09/2013  . 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 Benign prostatic hyperplasia with urinary obstruction 04/01/2012  . 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 Benign neoplasm of cerebral meninges 04/24/2011     Past Medical History  Diagnosis Date  . Coronary artery disease   . Meningioma     stable, followed by yearly MRIs  . GERD (gastroesophageal reflux disease)   . History of hiatal hernia 2012  . Essential hypertension 06/05/2014  . Hyperlipidemia   . BPH (benign prostatic hyperplasia)   . Cataracts, bilateral   . Hemorrhoids   . Migraines   . RBBB   . Stomach ulcer   . GI bleed     a. ~2012 around time of hiatal hernia surgery. 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     Procedures: CORONARY ARTERY BYPASS GRAFTING x 4   Left internal mammary to Left Anterior descending  Saphenous vein graft to left intermediate  Saphenous vein graft  to posterior descending  Saphenous vein graft to obtuse marginal  ENDOSCOPIC RIGHTGREATER SAPHENOUS VEIN HARVEST - 01/19/2015    HPI:  The patient is a 74 y.o. male with a known history of CAD, status post angioplasty and atherectomy at [facility] in 1999.  He had remained stable for a number of years, but has noted increasing episodes of substernal discomfort radiating to his left arm with exertion over the past several months . He denies rest discomfort. The symptoms have been progressing over the past several months, he now becomes symptomatic if he walks a quarter of a mile, whereas 6 months ago, he was able to walk 2-3 miles without difficulty.  He underwent cardiac catheterization on 01/07/2015 by Dr. Nehemiah Massed which showed a high grade proximal LAD lesion of at least 80%, 99% proximal-mid right, 20% circumflex, and EF 60%.  The patient was referred to Dr. Servando Snare for consideration of surgical revascularization.  It was felt that the patient would benefit from CABG. All risks, benefits and alternatives of surgery were explained in detail, and the patient agreed to proceed.     Hospital Course:  The patient was admitted to [facility] on 01/19/2015. The patient was taken to the operating room and underwent the above procedure.    The postoperative course was notable initially for nausea and vomiting.  He was treated conservatively  and started on Reglan with improvement of symptoms.  The patient had issues with hematuria which was passage of blood clots from foley insertion.  He developed rapid Atrial fibrillation which was treated with Amiodarone.  However, he did not initially convert to NSR.  He was placed on Heparin for protection against stroke.  Cardioversion was attempted however this was aborted as TEE probe was unable to be passed.  He also developed hypotension and required further treatment in the SICU.  He complained of a sore throat after the procedure.  Barium swallow was obtained and did not show  evidence of esophageal perforation.  He was placed on Cardizem drip for additional rate control.  He converted to NSR without any further intervention.  He was started on Coumadin for additional stroke prophylaxis.  The patient was transferred back to the telemetry unit in stable condition.  He is maintaining NSR.  His INR continues to trend up and is currently at 1.78.  He will remain on 5 mg daily with plans for INR check on Monday.  He has otherwise remained stable.  Incisions are healing well.  He is ambulating without difficulty and is tolerating a regular diet.  Presently, the patient is medically stable for discharge home today 01/30/2015.  Recent vital signs:  Filed Vitals:   01/30/15 0416  BP: 110/52  Pulse: 72  Temp: 98.4 F (36.9 C)  Resp: 21    Recent laboratory studies:  CBC:  Recent Labs  01/30/15 0452  WBC 10.5  HGB 10.1*  HCT 30.8*  PLT 377   BMET:   Recent Labs  01/28/15 0246 01/29/15 0442  NA 137 136  K 4.1 3.9  CL 104 104  CO2 24 26  GLUCOSE 90 118*  BUN 18 19  CREATININE 1.30* 1.22  CALCIUM 8.5* 8.7*    PT/INR:   Recent Labs  01/30/15 0452  LABPROT 20.7*  INR 1.78*     Discharge Medications:     Medication List    STOP taking these medications        amLODipine 2.5 MG tablet  Commonly known as:  NORVASC     multivitamin with minerals tablet     nitroGLYCERIN 0.4 MG SL tablet  Commonly known as:  NITROSTAT      TAKE these medications        amiodarone 200 MG tablet  Commonly known as:  PACERONE  Take 2 tablets (400 mg total) by mouth 2 (two) times daily. For 7 Days, then decrease to 200 mg BID for 7 days, then decrease to 200 mg daily     aspirin 81 MG tablet  Take 81 mg by mouth daily.     atorvastatin 20 MG tablet  Commonly known as:  LIPITOR  Take 20 mg by mouth daily.     cetirizine 5 MG tablet  Commonly known as:  ZYRTEC  Take 5 mg by mouth daily.     chlorhexidine 0.12 % solution  Commonly known as:  PERIDEX    Use as directed 15 mLs in the mouth or throat 2 (two) times daily.     cyanocobalamin 100 MCG tablet  Take 100 mcg by mouth daily.     dexlansoprazole 60 MG capsule  Commonly known as:  DEXILANT  Take 60 mg by mouth daily.     dutasteride 0.5 MG capsule  Commonly known as:  AVODART  Take 0.5 mg by mouth daily.     latanoprost 0.005 % ophthalmic solution  Commonly  known as:  XALATAN  Place 1 drop into both eyes at bedtime.     polyethylene glycol packet  Commonly known as:  MIRALAX / GLYCOLAX  Take 17 g by mouth daily.     timolol 0.25 % ophthalmic solution  Commonly known as:  BETIMOL  Place 1 drop into both eyes daily.     traMADol 50 MG tablet  Commonly known as:  ULTRAM  Take 1-2 tablets (50-100 mg total) by mouth every 4 (four) hours as needed for moderate pain.     triamcinolone cream 0.1 %  Commonly known as:  KENALOG  Apply 1 application topically 2 (two) times daily as needed (Foot).     warfarin 5 MG tablet  Commonly known as:  COUMADIN  Take 1 tablet (5 mg total) by mouth daily at 6 PM.        Discharge Instructions:  The patient is to refrain from driving, heavy lifting or strenuous activity.  May shower daily and clean incisions with soap and water.  May resume regular diet.    Follow Up: Follow-up Information    Follow up with Grace Isaac, MD On 03/04/2015.   Specialty:  Cardiothoracic Surgery   Why:  Have a chest x-ray at 8:30 at [facility], then see MD at 9:30   Contact information:   [address] [place] [address] [phone]       Follow up with Corey Skains, MD In 2 weeks.   Specialty:  Internal Medicine   Why:  Please call for an appointment   Contact information:   [place] [address] [phone]       Follow up with [facility] On 02/01/2015.   Specialty:  Cardiology   Why:  @2 :30pm. New coumadin followup, if Dr. Nehemiah Massed has coumadin clinic in his  office, can transfer coumadin check to his office   Contact information:   [address], [place] [phone]      Follow up with urology.   Why:  set up appointment with Urologist      The patient has been discharged on:  1.Beta Blocker: Yes [  ]  No [x ]  If No, reason:  Bradycardia  2.Ace Inhibitor/ARB: Yes [ ]   No [ x ]  If No, reason: Mildly elevated creatinine   3.Statin: Yes [ x ]  No [ ]   If No, reason:    4.Ecasa: Yes [ x ]  No [ ]   If No, reason:  Discharge Instructions    Amb Referral to Cardiac Rehabilitation    Complete by:  As directed   Congestive Heart Failure: If diagnosis is Heart Failure, patient MUST meet each of the CMS criteria: 1. Left Ventricular Ejection Fraction </= 35% 2. NYHA class II-IV symptoms despite being on optimal heart failure therapy for at least 6 weeks. 3. Stable = have not had a recent (<6 weeks) or planned (<6 months) major cardiovascular hospitalization or procedure  Program Details: - Physician supervised classes - 1-3 classes per week over a 12-18 week period, generally for a total of 36 sessions  Physician Certification: I certify that the above Cardiac Rehabilitation treatment is medically necessary and is medically approved by me for treatment of this patient. The patient is willing and cooperative, able to ambulate and medically stable to participate in exercise rehabilitation. The participant's progress and Individualized Treatment Plan will be reviewed by the Medical  Director, Cardiac Rehab staff and as indicated by the Referring/Ordering Physician.  Diagnosis:  CABG           Follow-up Information    Follow up with Grace Isaac, MD On 03/04/2015.   Specialty:  Cardiothoracic Surgery   Why:  Have a chest x-ray at 8:30 at [facility], then see MD at 9:30   Contact information:   [address] [place] [address] [phone]       Follow up  with Corey Skains, MD In 2 weeks.   Specialty:  Internal Medicine   Why:  Please call for an appointment   Contact information:   [place] [address] [phone]       Follow up with [facility] On 02/01/2015.   Specialty:  Cardiology   Why:  @2 :30pm. New coumadin followup, if Dr. Nehemiah Massed has coumadin clinic in his office, can transfer coumadin check to his office   Contact information:   [address] [place] [phone]      Follow up with urology.   Why:  set up appointment with Urologist        Lisel Siegrist 01/30/2015, 9:09 AM

## 2015-01-22 NOTE — Progress Notes (Addendum)
CARDIAC REHAB PHASE I   PRE:  Rate/Rhythm: 74 SR with PACs    BP: sitting 111/64    SaO2: 95 2L  MODE:  Ambulation: 350 ft   POST:  Rate/Rhythm: 77 SR    BP: sitting 140/71     SaO2: 94-95 RA  Pt able to stand fairly well. Used RW to walk, steady. Began to c/o fatigue toward end and began belching, some nausea. Pt relates this to getting overly tired. Return to recliner. Pt did not need O2. Pt with bigeminy PACs at times. Will f/u. Encouraged IS and x2 more walks. [phone]   Josephina Shih Loyal CES, ACSM 01/22/2015 11:57 AM

## 2015-01-22 NOTE — Progress Notes (Signed)
Pt voided and was scanned afterwards.  Bladder scan showed 330 ml post residual.  I asked patient did he feel he had emptied his bladder. Pt stated he had a problem in the past and had it fixed.  Pt felt if he went to bathroom and sat down he would do better.  Re-scanned patient after void and showed 103 ml post residual. Pt resting with call bell within reach.  Will continue to monitor. Payton Emerald, RN

## 2015-01-22 NOTE — Progress Notes (Addendum)
      [address]       [place] [address]             [phone]      3 Days Post-Op Procedure(s) (LRB): CORONARY ARTERY BYPASS GRAFTING (CABG), with LIM to LAD, vein to left intermediate, vein to PD, vein to OM, using right greater saphenous vein via endovein harvest. (N/A) TRANSESOPHAGEAL ECHOCARDIOGRAM (TEE) (N/A) ENDOVEIN HARVEST OF GREATER SAPHENOUS VEIN (Right)   Subjective:  Mr. Rawl states he is feeling better today.  He did ambulate yesterday in the hallway. He has been unable to void much, other than very small amounts.  There is some blood on the floor which he states was after he urinated.  Objective: Vital signs in last 24 hours: Temp:  [97.6 F (36.4 C)-98.8 F (37.1 C)] 98.2 F (36.8 C) (07/08 0559) Pulse Rate:  [65-88] 81 (07/08 0559) Cardiac Rhythm:  [-] Normal sinus rhythm (07/08 0254) Resp:  [11-18] 18 (07/08 0559) BP: (104-128)/(53-69) 108/67 mmHg (07/08 0559) SpO2:  [93 %-98 %] 93 % (07/08 0559) Weight:  [228 lb (103.42 kg)] 228 lb (103.42 kg) (07/08 0559)  Intake/Output from previous day: 07/07 0701 - 07/08 0700 In: 490 [P.O.:420; I.V.:20; IV Piggyback:50] Out: 824 [Urine:824]  General appearance: alert, cooperative and no distress Heart: regular rate and rhythm Lungs: clear to auscultation bilaterally Abdomen: soft, non-tender; bowel sounds normal; no masses,  no organomegaly Extremities: edema tarce Wound: clean and dry  Lab Results:  Recent Labs  01/21/15 0509 01/22/15 0238  WBC 15.0* 14.1*  HGB 10.5* 10.6*  HCT 32.1* 32.1*  PLT 152 162   BMET:  Recent Labs  01/21/15 0509 01/22/15 0238  NA 138 140  K 3.9 4.0  CL 106 106  CO2 26 27  GLUCOSE 107* 130*  BUN 19 24*  CREATININE 1.23 1.16  CALCIUM 8.7* 8.8*    PT/INR:  Recent Labs  01/19/15 1355  LABPROT 17.8*  INR 1.46   ABG    Component Value Date/Time   PHART 7.327* 01/19/2015 1849   HCO3 23.2 01/19/2015 1849   TCO2 20 01/20/2015 1607   ACIDBASEDEF 3.0* 01/19/2015 1849   O2SAT 94.0 01/19/2015 1849   CBG (last 3)   Recent Labs  01/21/15 1702 01/21/15 2038 01/22/15 0554  GLUCAP 114* 116* 119*    Assessment/Plan: S/P Procedure(s) (LRB): CORONARY ARTERY BYPASS GRAFTING (CABG), with LIM to LAD, vein to left intermediate, vein to PD, vein to OM, using right greater saphenous vein via endovein harvest. (N/A) TRANSESOPHAGEAL ECHOCARDIOGRAM (TEE) (N/A) ENDOVEIN HARVEST OF GREATER SAPHENOUS VEIN (Right)  1. CV- NSR, good pressure, rate control- continue Lopressor 2. Pulm- wean oxygen as tolerated, continue IS 3. Renal- mildly elevated creatinine at 1.16, weight is stable, not currently on Lasix 4. GU- urinary retention? Post void residual ordered, some blood clots with urination, will start Flomax 5. Cbgs controlled, not a diabetic 6. Dispo- patient stable, residual scan for retention, watch creatinine, lactulose prn    LOS: 3 days    Ellwood Handler 01/22/2015 Monitor for urinary retention I have seen and examined [name] and agree with the above assessment  and plan.  Grace Isaac MD Beeper [phone] Office [phone] 01/22/2015 2:43 PM

## 2015-01-22 NOTE — Progress Notes (Signed)
Patient voided prior to 0700.  Bladder scanned at 0920 per MD.  Patient did not feel need to void prior to bladder scan. 238 ml showed on scan.  Will attempt to scan later after next void. Pt resting with call bell within reach.  Will continue to monitor. Payton Emerald, RN

## 2015-01-23 LAB — CBC
HCT: 29.7 % — ABNORMAL LOW (ref 39.0–52.0)
Hemoglobin: 10 g/dL — ABNORMAL LOW (ref 13.0–17.0)
MCH: 31.9 pg (ref 26.0–34.0)
MCHC: 33.7 g/dL (ref 30.0–36.0)
MCV: 94.9 fL (ref 78.0–100.0)
Platelets: 179 10*3/uL (ref 150–400)
RBC: 3.13 MIL/uL — ABNORMAL LOW (ref 4.22–5.81)
RDW: 13.9 % (ref 11.5–15.5)
WBC: 8.9 10*3/uL (ref 4.0–10.5)

## 2015-01-23 LAB — BASIC METABOLIC PANEL
Anion gap: 7 (ref 5–15)
BUN: 25 mg/dL — ABNORMAL HIGH (ref 6–20)
CO2: 27 mmol/L (ref 22–32)
Calcium: 8.6 mg/dL — ABNORMAL LOW (ref 8.9–10.3)
Chloride: 103 mmol/L (ref 101–111)
Creatinine, Ser: 1.16 mg/dL (ref 0.61–1.24)
GFR calc Af Amer: >60 mL/min (ref >60)
GFR calc non Af Amer: 60 mL/min — ABNORMAL LOW (ref >60)
Glucose, Bld: 115 mg/dL — ABNORMAL HIGH (ref 65–99)
Potassium: 4 mmol/L (ref 3.5–5.1)
Sodium: 137 mmol/L (ref 135–145)

## 2015-01-23 MED ORDER — METOPROLOL TARTRATE 12.5 MG HALF TABLET
12.5000 mg | ORAL_TABLET | Freq: Once | ORAL | Status: AC
  Administered 2015-01-23: 12.5 mg via ORAL
  Filled 2015-01-23: qty 1

## 2015-01-23 MED ORDER — AMIODARONE HCL IN DEXTROSE 360-4.14 MG/200ML-% IV SOLN
30.0000 mg/h | INTRAVENOUS | Status: DC
  Administered 2015-01-23 – 2015-01-25 (×4): 30 mg/h via INTRAVENOUS
  Filled 2015-01-23 (×8): qty 200

## 2015-01-23 MED ORDER — AMIODARONE HCL IN DEXTROSE 360-4.14 MG/200ML-% IV SOLN
60.0000 mg/h | INTRAVENOUS | Status: AC
  Administered 2015-01-23: 60 mg/h via INTRAVENOUS
  Filled 2015-01-23: qty 200

## 2015-01-23 MED ORDER — AMIODARONE HCL 200 MG PO TABS
200.0000 mg | ORAL_TABLET | Freq: Two times a day (BID) | ORAL | Status: DC
  Administered 2015-01-23: 200 mg via ORAL
  Filled 2015-01-23 (×2): qty 1

## 2015-01-23 NOTE — Progress Notes (Signed)
CARDIAC REHAB PHASE I   PRE:  Rate/Rhythm: Afib  115  BP:    Sitting: 94/62  Standing: 81/46 complained of feeling lightheaded   SaO2: 96% 2L/min  MODE:  Ambulation: 0 ft   POST:  Rate/Rhythem:   BP:  Sitting 114/62  1500-1530 Bradley [name] wanted to walk this afternoon, says he feels better. Patient reported feeling lightheaded after standing up. Patient placed back in recliner. Feet elevated. Patient given some ginger ale to drink. Repeat sitting blood pressure improved. Set up discharge OHS video for patient and his family to watch. RN notified. Patient reported feeling better after sitting back down  Kea Callan, Christa See RN BSN

## 2015-01-23 NOTE — Progress Notes (Signed)
RN assisted patient to the bedside commode this morning, patient's HR went up to 140 with activity in A fib, HR did not sustain. Patient back in bed, RN noted patient converted back to NSR; HR 67; VSS at 5:01 AM; Patient is in bed, call bell within reach. RN will continue to monitor.   Sharene Skeans, RN

## 2015-01-23 NOTE — Progress Notes (Signed)
Patient converted to A fib this AM; EKG done; HR in the 110's, patient is resting comfortably, call bell within reach. RN will continue to monitor.     Sharene Skeans, RN

## 2015-01-23 NOTE — Progress Notes (Signed)
Patient has maintained atrial fibrillation since lunchtime.  Patient walked once this morning but has since progressively felt worse.  Patient is not nauseated but feels "washed out".  Patient given one time extra dose of metoprolol 12.5 mg at 12:30 and started on PO amiodarone.  Called PA Suzzanne Cloud to update on patient status.  New orders given, pt started on IV amiodarone.  Patient resting in chair with family in room. Will continue to monitor. Payton Emerald, RN

## 2015-01-23 NOTE — Progress Notes (Addendum)
       [address]       [place] [address]             [phone]          4 Days Post-Op Procedure(s) (LRB): CORONARY ARTERY BYPASS GRAFTING (CABG), with LIM to LAD, vein to left intermediate, vein to PD, vein to OM, using right greater saphenous vein via endovein harvest. (N/A) TRANSESOPHAGEAL ECHOCARDIOGRAM (TEE) (N/A) ENDOVEIN HARVEST OF GREATER SAPHENOUS VEIN (Right)  Subjective: Had some nausea overnight, but feels better and tolerated breakfast without problem this am.  No BM yet. Having runs of AF, then bradycardia when he converts.  Asymptomatic.   Objective: Vital signs in last 24 hours: Patient Vitals for the past 24 hrs:  BP Temp Temp src Pulse Resp SpO2 Weight  01/23/15 0425 124/71 mmHg 97.9 F (36.6 C) Oral (!) 128 18 95 % 226 lb 3.2 oz (102.604 kg)  01/22/15 2000 121/67 mmHg 98.6 F (37 C) - - 18 92 % -  01/22/15 1950 - - - - - (!) 89 % -  01/22/15 1401 127/64 mmHg 97.7 F (36.5 C) Oral 78 18 97 % -  01/22/15 1003 117/63 mmHg - - 77 - - -   Current Weight  01/23/15 226 lb 3.2 oz (102.604 kg)  PRE-OPERATIVE WEIGHT: 102.5 kg   Intake/Output from previous day: 07/08 0701 - 07/09 0700 In: 480 [P.O.:480] Out: 750 [Urine:750]    PHYSICAL EXAM:  Heart: RRR Lungs: Clear Abdomen: Soft, NT/ND, +BS Wound: Clean and dry Extremities: No significant edema    Lab Results: CBC: Recent Labs  01/22/15 0238 01/23/15 0222  WBC 14.1* 8.9  HGB 10.6* 10.0*  HCT 32.1* 29.7*  PLT 162 179   BMET:  Recent Labs  01/22/15 0238 01/23/15 0222  NA 140 137  K 4.0 4.0  CL 106 103  CO2 27 27  GLUCOSE 130* 115*  BUN 24* 25*  CREATININE 1.16 1.16  CALCIUM 8.8* 8.6*    PT/INR: No results for input(s): LABPROT, INR in the last 72 hours.    Assessment/Plan: S/P Procedure(s) (LRB): CORONARY ARTERY BYPASS GRAFTING (CABG), with LIM to LAD, vein to left intermediate, vein to PD, vein to OM, using right greater saphenous vein via endovein  harvest. (N/A) TRANSESOPHAGEAL ECHOCARDIOGRAM (TEE) (N/A) ENDOVEIN HARVEST OF GREATER SAPHENOUS VEIN (Right)  CV- BPs stable. Tachy/brady this am. Continue low dose beta blocker. Will start low dose po Amio and watch rhythm.  GI- nausea resolved. Passing flatus. LOC ordered.  Renal- Cr stable, continue to monitor.  Expected postop blood loss anemia- H/H stable.  CRPI, pulm toilet.  GU- voiding without difficulty since Foley d/c'ed. PVRs low. Continue to watch, Avodart resumed.   LOS: 4 days    COLLINS,GINA H 01/23/2015  afib today 110, started on Cordarone  Nausea better  I have seen and examined Bradley [name] and agree with the above assessment  and plan.  Grace Isaac MD Beeper [phone] Office [phone] 01/23/2015 11:58 AM

## 2015-01-23 NOTE — Progress Notes (Signed)
1340 Blood pressure 93/49 heart rate 108-120's. Patient complained of feeling "punny" and does not want to walk right now.  Oxygen saturation 91% on room air. Will check in on the patient later.

## 2015-01-23 NOTE — Progress Notes (Signed)
Patient in/out of sinus rhythm and atrial fibrillation. Heart rate elevates to 140's and then become sinus brady. Currently sinus rhythm 63. Initially planned to give po metoprolol early but will await MD rounding. Pt resting with call bell within reach.  Will continue to monitor. Payton Emerald, RN

## 2015-01-23 NOTE — Progress Notes (Signed)
Patient ambulated 150 ft. On RA with a standby assist using a rolling walker. RN reassessed patient's vitals after the walk BP 121/67; HR 78; SaO2; 80%, RN placed patient on 2L of oxygen, SaO2 92%. Respirations 18, regular, non- labored. Patient has no c/o at this time, call bell within reach, RN will continue to monitor.    Sharene Skeans, RN

## 2015-01-23 NOTE — Progress Notes (Signed)
CCMD alerted RN of patient's frequent bigeminy and trigeminy at 12:23, RN assessed patient, patient is resting comfortably, VSS, patient back in NSR. MD on call was notified of the changes, orders given to order a CBC and BMP in the AM, orders followed through. RN will continue to monitor.  Sharene Skeans, RN

## 2015-01-24 MED ORDER — METOPROLOL TARTRATE 25 MG PO TABS
25.0000 mg | ORAL_TABLET | Freq: Two times a day (BID) | ORAL | Status: DC
  Administered 2015-01-24 – 2015-01-26 (×5): 25 mg via ORAL
  Filled 2015-01-24 (×7): qty 1

## 2015-01-24 MED ORDER — AMIODARONE IV BOLUS ONLY 150 MG/100ML: 150.0000 mg | Freq: Once | INTRAVENOUS | Status: DC

## 2015-01-24 MED ORDER — AMIODARONE LOAD VIA INFUSION
150.0000 mg | Freq: Once | INTRAVENOUS | Status: AC
  Administered 2015-01-24: 150 mg via INTRAVENOUS
  Filled 2015-01-24: qty 83.34

## 2015-01-24 NOTE — Progress Notes (Signed)
  Amiodarone Drug - Drug Interaction Consult Note  Recommendations:     Amiodarone is metabolized by the cytochrome P450 system and therefore has the potential to cause many drug interactions. Amiodarone has an average plasma half-life of 50 days (range 20 to 100 days).   There is potential for drug interactions to occur several weeks or months after stopping treatment and the onset of drug interactions may be slow after initiating amiodarone.   [x]  Statins: Increased risk of myopathy. Simvastatin- restrict dose to 20mg  daily. Other statins: counsel patients to report any muscle pain or weakness immediately.  [x]  Anticoagulants: Amiodarone can increase anticoagulant effect. Consider warfarin dose reduction. Patients should be monitored closely and the dose of anticoagulant altered accordingly, remembering that amiodarone levels take several weeks to stabilize.  []  Antiepileptics: Amiodarone can increase plasma concentration of phenytoin, the dose should be reduced. Note that small changes in phenytoin dose can result in large changes in levels. Monitor patient and counsel on signs of toxicity.  [x]  Beta blockers: increased risk of bradycardia, AV block and myocardial depression. Sotalol - avoid concomitant use.  []   Calcium channel blockers (diltiazem and verapamil): increased risk of bradycardia, AV block and myocardial depression.  []   Cyclosporine: Amiodarone increases levels of cyclosporine. Reduced dose of cyclosporine is recommended.  []  Digoxin dose should be halved when amiodarone is started.  [x]  Diuretics: increased risk of cardiotoxicity if hypokalemia occurs.  []  Oral hypoglycemic agents (glyburide, glipizide, glimepiride): increased risk of hypoglycemia. Patient's glucose levels should be monitored closely when initiating amiodarone therapy.   []  Drugs that prolong the QT interval:  Torsades de pointes risk may be increased with concurrent use - avoid if possible.  Monitor  QTc, also keep magnesium/potassium WNL if concurrent therapy can't be avoided. Marland Kitchen Antibiotics: e.g. fluoroquinolones, erythromycin. . Antiarrhythmics: e.g. quinidine, procainamide, disopyramide, sotalol. . Antipsychotics: e.g. phenothiazines, haloperidol.  . Lithium, tricyclic antidepressants, and methadone.   Ashtian Villacis S. Alford Highland, PharmD, BCPS Clinical Staff Pharmacist Pager [phone] Thank You,  Eilene Ghazi Stillinger  01/24/2015 11:19 AM

## 2015-01-24 NOTE — Progress Notes (Signed)
Called PA [facility] concerning HR.  Patient continues to have episodes of heart rate elevating up 120-140's with activity. Patient has not walked today due to soft blood pressure and elevated HR (atrial fib) with activity.  Patient is currently asymptomatic and wanting to walk. Amiodarone drip is at 30 mg/hr dose and heart rate within 65-105 range without activity. Will give increased metoprolol dose early tonight and monitor patient for drop in blood pressure. Pt resting with call bell within reach.  Will continue to monitor. Payton Emerald, RN

## 2015-01-24 NOTE — Progress Notes (Signed)
Patient went to bathroom and attempted to have bowel movement, only gas.  Patient stated he felt like he "might pass out but not sure".  Patient heart rate elevated to 140's atrial fibrillation while getting to restroom and back to bed. Patient heart rate returned 90-100's once returned to bed. Patient ate small amount of breakfast and notes he is still not eating much. No shortness of breath or nausea.  Pt resting with call bell within reach.  Will continue to monitor. Payton Emerald, RN

## 2015-01-24 NOTE — Progress Notes (Signed)
Patient fluctuates between Afib and a flutter with a HR in the 100's-110's at rest with amiodarone iv running at 30 mg/hr. When patient got up to the bedside commode, RN noted patient's HR got up to 150's. RN assessed vitals BP 115/64; RR 20; saO2 96% 2 L. Patient is asymptomatic with no complaints at this time. MD was notified, orders given to run 150 mg amiodarone and to continue amiodarone rate at 30 mg/hr. Orders followed through, patient resting comfortably. Patient currently remains in a fib HR in the 90's. RN will continue to monitor.   Sharene Skeans, RN

## 2015-01-24 NOTE — Progress Notes (Addendum)
[address]       [place] [address]             [phone]          5 Days Post-Op Procedure(s) (LRB): CORONARY ARTERY BYPASS GRAFTING (CABG), with LIM to LAD, vein to left intermediate, vein to PD, vein to OM, using right greater saphenous vein via endovein harvest. (N/A) TRANSESOPHAGEAL ECHOCARDIOGRAM (TEE) (N/A) ENDOVEIN HARVEST OF GREATER SAPHENOUS VEIN (Right)  Subjective: Developed AF yesterday and started on IV Amiodarone.  Feels "terrible" this am.  HR increases with activity and pt weak. Had a small liquid stool and passing flatus. Nausea better.   Objective: Vital signs in last 24 hours: Patient Vitals for the past 24 hrs:  BP Temp Temp src Pulse Resp SpO2 Weight  01/24/15 0821 120/64 mmHg - - (!) 113 20 - -  01/24/15 0320 115/64 mmHg 97.6 F (36.4 C) Oral (!) 113 20 96 % -  01/24/15 0312 - - - - - - 225 lb 8 oz (102.286 kg)  01/23/15 2049 114/70 mmHg 98.1 F (36.7 C) Oral (!) 107 16 97 % -  01/23/15 1548 (!) 100/50 mmHg 98.3 F (36.8 C) Oral (!) 110 19 97 % -  01/23/15 1510 114/67 mmHg - - (!) 122 - - -  01/23/15 1231 - - - (!) 120 - - -  01/23/15 1225 (!) 104/50 mmHg - - (!) 117 - - -  01/23/15 1215 (!) 96/52 mmHg - - (!) 122 - - -  01/23/15 1055 - - - (!) 127 - - -   Current Weight  01/24/15 225 lb 8 oz (102.286 kg)  PRE-OPERATIVE WEIGHT: 102.5 kg   Intake/Output from previous day: 07/09 0701 - 07/10 0700 In: 120 [P.O.:120] Out: 313 [Urine:310; Stool:3]    PHYSICAL EXAM:  Heart: Irr irr, rates 90-100s Lungs: Clear Abdomen: Soft, NT/ND, +BS Wound: Clean and dry Extremities: Mild LE edema    Lab Results: CBC: Recent Labs  01/22/15 0238 01/23/15 0222  WBC 14.1* 8.9  HGB 10.6* 10.0*  HCT 32.1* 29.7*  PLT 162 179   BMET:  Recent Labs  01/22/15 0238 01/23/15 0222  NA 140 137  K 4.0 4.0  CL 106 103  CO2 27 27  GLUCOSE 130* 115*  BUN 24* 25*  CREATININE 1.16 1.16  CALCIUM 8.8* 8.6*    PT/INR: No  results for input(s): LABPROT, INR in the last 72 hours.    Assessment/Plan: S/P Procedure(s) (LRB): CORONARY ARTERY BYPASS GRAFTING (CABG), with LIM to LAD, vein to left intermediate, vein to PD, vein to OM, using right greater saphenous vein via endovein harvest. (N/A) TRANSESOPHAGEAL ECHOCARDIOGRAM (TEE) (N/A) ENDOVEIN HARVEST OF GREATER SAPHENOUS VEIN (Right)  CV- Remains in AF this am, rates 90-100s , but increase with activity. Continue IV Amio, low dose beta blocker. Not much room to increase Lopressor with low BPs.  Renal- Cr stable, continue to monitor.  Expected postop blood loss anemia- H/H stable.  CRPI, pulm toilet.  GU- voiding without difficulty since Foley d/c'ed. PVRs low. Continue to watch, Avodart resumed.    LOS: 5 days    COLLINS,GINA H 01/24/2015   Less then 24 hours of afib now, on IV cordarone, rate lower not goes up with activity Start coumadin if does not convert soon I have seen and examined Bradley [name] and agree with the above assessment  and plan.  Grace Isaac MD Beeper [phone] Office [phone]  01/24/2015 10:50 AM

## 2015-01-25 ENCOUNTER — Encounter (HOSPITAL_COMMUNITY): Payer: Self-pay | Admitting: Physician Assistant

## 2015-01-25 DIAGNOSIS — Z951 Presence of aortocoronary bypass graft: Secondary | ICD-10-CM

## 2015-01-25 DIAGNOSIS — I481 Persistent atrial fibrillation: Secondary | ICD-10-CM

## 2015-01-25 LAB — CBC
HEMATOCRIT: 33.9 % — AB (ref 39.0–52.0)
HEMOGLOBIN: 11.3 g/dL — AB (ref 13.0–17.0)
MCH: 32.1 pg (ref 26.0–34.0)
MCHC: 33.3 g/dL (ref 30.0–36.0)
MCV: 96.3 fL (ref 78.0–100.0)
Platelets: 257 10*3/uL (ref 150–400)
RBC: 3.52 MIL/uL — AB (ref 4.22–5.81)
RDW: 14 % (ref 11.5–15.5)
WBC: 9.8 10*3/uL (ref 4.0–10.5)

## 2015-01-25 LAB — BASIC METABOLIC PANEL
ANION GAP: 8 (ref 5–15)
BUN: 20 mg/dL (ref 6–20)
CO2: 27 mmol/L (ref 22–32)
Calcium: 8.7 mg/dL — ABNORMAL LOW (ref 8.9–10.3)
Chloride: 105 mmol/L (ref 101–111)
Creatinine, Ser: 1.18 mg/dL (ref 0.61–1.24)
GFR calc Af Amer: >60 mL/min (ref >60)
GFR calc non Af Amer: 59 mL/min — ABNORMAL LOW (ref >60)
GLUCOSE: 118 mg/dL — AB (ref 65–99)
POTASSIUM: 3.5 mmol/L (ref 3.5–5.1)
Sodium: 140 mmol/L (ref 135–145)

## 2015-01-25 LAB — MAGNESIUM: Magnesium: 2.1 mg/dL (ref 1.7–2.4)

## 2015-01-25 LAB — PROTIME-INR
INR: 1.23 (ref 0.00–1.49)
Prothrombin Time: 15.6 seconds — ABNORMAL HIGH (ref 11.6–15.2)

## 2015-01-25 LAB — TSH: TSH: 0.968 u[IU]/mL (ref 0.350–4.500)

## 2015-01-25 MED ORDER — WARFARIN VIDEO
Freq: Once | Status: AC
  Administered 2015-01-25: 1

## 2015-01-25 MED ORDER — AMIODARONE HCL 200 MG PO TABS
400.0000 mg | ORAL_TABLET | Freq: Two times a day (BID) | ORAL | Status: DC
  Administered 2015-01-25 – 2015-01-29 (×8): 400 mg via ORAL
  Filled 2015-01-25 (×12): qty 2

## 2015-01-25 MED ORDER — WARFARIN - PHYSICIAN DOSING INPATIENT: Freq: Every day | Status: DC

## 2015-01-25 MED ORDER — WARFARIN SODIUM 5 MG PO TABS
5.0000 mg | ORAL_TABLET | Freq: Once | ORAL | Status: AC
  Administered 2015-01-25: 5 mg via ORAL
  Filled 2015-01-25: qty 1

## 2015-01-25 MED ORDER — ENOXAPARIN SODIUM 100 MG/ML ~~LOC~~ SOLN
1.0000 mg/kg | Freq: Two times a day (BID) | SUBCUTANEOUS | Status: DC
  Administered 2015-01-25 – 2015-01-30 (×9): 90 mg via SUBCUTANEOUS
  Filled 2015-01-25 (×13): qty 1

## 2015-01-25 MED ORDER — PATIENT'S GUIDE TO USING COUMADIN BOOK
Freq: Once | Status: AC
  Administered 2015-01-25: 18:00:00
  Filled 2015-01-25: qty 1

## 2015-01-25 MED ORDER — ASPIRIN EC 81 MG PO TBEC
81.0000 mg | DELAYED_RELEASE_TABLET | Freq: Every day | ORAL | Status: DC
  Administered 2015-01-26 – 2015-01-29 (×3): 81 mg via ORAL
  Filled 2015-01-25 (×5): qty 1

## 2015-01-25 MED ORDER — WARFARIN SODIUM 5 MG PO TABS
5.0000 mg | ORAL_TABLET | Freq: Every day | ORAL | Status: DC
  Administered 2015-01-26 – 2015-01-29 (×3): 5 mg via ORAL
  Filled 2015-01-25 (×5): qty 1

## 2015-01-25 NOTE — Progress Notes (Signed)
CARDIAC REHAB PHASE I   PRE:  Rate/Rhythm: 100 afib    BP: sitting 120/66    SaO2: 95 RA  MODE:  Ambulation: 350 ft   POST:  Rate/Rhythm: 110 afib    BP: sitting 109/62     SaO2: pulse ox not working  Pt sts he still feels weak but slightly better. Used arms to get to EOB. Discussed scooting hips. Able to stand without assist. Steady walking with RW, assist x2. HR stable, no c/o except fatigue toward end. To recliner. Sts he feels better after walking. Encouraged x2 more walks today with staff.  Schaller, ACSM 01/25/2015 1:00 PM

## 2015-01-25 NOTE — Progress Notes (Addendum)
      [address]       [place] [address]             [phone]      6 Days Post-Op Procedure(s) (LRB): CORONARY ARTERY BYPASS GRAFTING (CABG), with LIM to LAD, vein to left intermediate, vein to PD, vein to OM, using right greater saphenous vein via endovein harvest. (N/A) TRANSESOPHAGEAL ECHOCARDIOGRAM (TEE) (N/A) ENDOVEIN HARVEST OF GREATER SAPHENOUS VEIN (Right)   Subjective:  Mr. Nickolson states he feels weak this morning.  He also states that he doesn't have much of an appetite this morning.   Objective: Vital signs in last 24 hours: Temp:  [98.3 F (36.8 C)] 98.3 F (36.8 C) (07/11 0559) Pulse Rate:  [92-114] 92 (07/11 0559) Cardiac Rhythm:  [-] Atrial fibrillation (07/11 0400) Resp:  [18-20] 18 (07/11 0559) BP: (107-126)/(61-72) 108/72 mmHg (07/11 0559) SpO2:  [94 %-100 %] 94 % (07/11 0559) Weight:  [225 lb 4.8 oz (102.195 kg)] 225 lb 4.8 oz (102.195 kg) (07/11 0559)   Intake/Output from previous day: 07/10 0701 - 07/11 0700 In: -  Out: 585 [Urine:585]  General appearance: alert, cooperative and no distress Heart: irregularly irregular rhythm Lungs: clear to auscultation bilaterally Abdomen: soft, non-tender; bowel sounds normal; no masses,  no organomegaly Extremities: edema trace Wound: clean and dry  Lab Results:  Recent Labs  01/23/15 0222 01/25/15 0400  WBC 8.9 9.8  HGB 10.0* 11.3*  HCT 29.7* 33.9*  PLT 179 257   BMET:  Recent Labs  01/23/15 0222 01/25/15 0400  NA 137 140  K 4.0 3.5  CL 103 105  CO2 27 27  GLUCOSE 115* 118*  BUN 25* 20  CREATININE 1.16 1.18  CALCIUM 8.6* 8.7*    PT/INR:  Recent Labs  01/25/15 0400  LABPROT 15.6*  INR 1.23   ABG    Component Value Date/Time   PHART 7.327* 01/19/2015 1849   HCO3 23.2 01/19/2015 1849   TCO2 20 01/20/2015 1607   ACIDBASEDEF 3.0* 01/19/2015 1849   O2SAT 94.0 01/19/2015 1849   CBG (last 3)   Recent Labs  01/22/15 1125 01/22/15 2203  GLUCAP 130* 117*     Assessment/Plan: S/P Procedure(s) (LRB): CORONARY ARTERY BYPASS GRAFTING (CABG), with LIM to LAD, vein to left intermediate, vein to PD, vein to OM, using right greater saphenous vein via endovein harvest. (N/A) TRANSESOPHAGEAL ECHOCARDIOGRAM (TEE) (N/A) ENDOVEIN HARVEST OF GREATER SAPHENOUS VEIN (Right)  1. CV- Atrial Fibrillation- continue Lopressor, will change amiodarone to PO 2. Pulm- no acute issues, continue IS 3. Renal- creatinine mildly elevated, weight remains stable, not on Lasix 4. GU- voiding issues remain stable, on Avodart 5. Dispo- patient stable, will start Coumadin today, check PT/INR in AM, will get Cardiology to evaluate patient for A. Fib   LOS: 6 days    BARRETT, [name] 01/25/2015  Now in afib 36 hours Coumadin stared this am and will increase Lovenox to bid until coumadin therapeutic I have seen and examined [name] and agree with the above assessment  and plan.  Grace Isaac MD Beeper [phone] Office [phone] 01/25/2015 3:42 PM

## 2015-01-25 NOTE — Progress Notes (Signed)
Patient shown educational video #109 on coumadin. Coumadin Education book given to patient and reviewed. Questions and concerns addressed.  Shevaun Lovan, Arville Lime

## 2015-01-25 NOTE — Progress Notes (Signed)
RN provided patient handout and education regarding atrial fibrillation. Patient has no questions at this time.

## 2015-01-25 NOTE — Consult Note (Signed)
Cardiology Consultation Note  Patient ID: Bradley [name], MRN: [id number], DOB/AGE: 10/26/1940 74 y.o. 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 He says in the past he was put on a medicine to help slow his HR down but after being put on oxycodone for BPH surgery developed worsening bradycardia thus this was discontinued. He recently developed worsening exertional chest pain which progressed to the point where he would become symptomatic if he walked a quarter of a mile. (6 months ago he was able to walk 2-3 miles without difficulty.) Recent cath showed severe 2 vessel CAD in the LAD and RCA, EF 60%. Dr. Servando Snare was consulted and recommended CABG - the patient underwent 4V CABG on 7/5 with LIMA-LAD, SVG-OM, SVG-Int, SVG-PD. Post-op course notable for expected mild ABL anemia, also with occasional PVCs in bigeminy, trigeminy. On 01/23/15 he went into paroxysms of atrial fibrillation/flutter with HR 110-140s interspersed with NSR/SB for an hour or so, then seemed to go into atrial flutter and has stayed there ever since. He was initially started on oral amiodarone for one dose, then changed to a drip, then yesterday required 150mg  bolus plus drip for elevated rates. This morning he was changed to amiodarone 400mg  BID. Metoprolol was increased from 12.5mg  BID to 25mg  BID last night. Cr stable. K 3.5 this AM (generally running 4.0), Mg 2.1. TSH 0.968. Last CXR with small bilateral effusions R>L with persistent LLL atx, decreasing edema. CVTS has started Coumadin. I spoke with neurology  today (Dr. Leonie Man) who feels that meningioma would not preclude anticoagulation use.  He reports no chest pain or SOB. He is generally unaware of any heart palpitations. He reports feeling wiped out after activities like transferring in the room, having a BM, etc. However, he said he walked with cardiac rehab today and actually seemed to feel better during the actual ambulation. Denies recent melena, BRBPR, syncope, LEE, orthopnea. HR currently low 100s, SBP 92/58. TEE from 7/5 reviewed with Dr. Harrington Challenger (report not in yet) - normal EF, mildly thickened MV with mild MR, calcified/thickened AV with trivial AI, LAA ok, normal appearing LA, RV mildly enlarged.   Past Medical History  Diagnosis Date  . Coronary artery disease   . Meningioma     stable, followed by yearly MRIs  . GERD (gastroesophageal reflux disease)   . History of hiatal hernia 2012  . Essential hypertension 06/05/2014  . Hyperlipidemia   . BPH (benign prostatic hyperplasia)   . Cataracts, bilateral   . Hemorrhoids   . Migraines   . RBBB   . Stomach ulcer   . GI bleed     a. ~2012 around time of hiatal hernia surgery. 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      Most Recent Cardiac Studies: See above re: TEE, cath.  Cath result 01/07/15:  Ost LAD to Prox LAD lesion, 85% stenosed.  Dist LAD lesion, 20% stenosed.  Prox Cx lesion, 20% stenosed.  Prox RCA to Mid RCA lesion, 99% stenosed.  Mid RCA lesion, 99% stenosed.  Dist RCA lesion, 40% stenosed.  EF 60%  Surgical History:  Past Surgical History  Procedure Laterality Date  . Cardiac surgery      coronary angioplasty  . Cholecystectomy    . Hiatal hernia repair N/A   . Cardiac catheterization N/A 01/07/2015    Procedure: Left Heart Cath;  Surgeon: Corey Skains, MD;  Location: [facility];  Service: Cardiovascular;  Laterality: N/A;  . Colonoscopy  11/03/08, 03/09/14  . Esophagogastroduodenoscopy  02/19/13  . Esophagus surgery     . Coronary angioplasty    . Atherectomy  1992  . Diagnostic laparoscopy      cholecystectomy and hiatal hernia repair  . Eye surgery      cataracts BIL  . Hernia repair    . Coronary artery bypass graft N/A 01/19/2015    Procedure: CORONARY ARTERY BYPASS GRAFTING (CABG), with LIM to LAD, vein to left intermediate, vein to PD, vein to OM, using right greater saphenous vein via endovein harvest.;  Surgeon: Grace Isaac, MD;  Location: [facility];  Service: Open Heart Surgery;  Laterality: N/A;  . Tee without cardioversion N/A 01/19/2015    Procedure: TRANSESOPHAGEAL ECHOCARDIOGRAM (TEE);  Surgeon: Grace Isaac, MD;  Location: [facility];  Service: Open Heart Surgery;  Laterality: N/A;  . Endovein harvest of greater saphenous vein Right 01/19/2015    Procedure: ENDOVEIN HARVEST OF GREATER SAPHENOUS VEIN;  Surgeon: Grace Isaac, MD;  Location: [facility];  Service: Open Heart Surgery;  Laterality: Right;     Home Meds: Prior to Admission medications   Medication Sig Start Date End Date Taking? Authorizing Provider  amLODipine (NORVASC) 2.5 MG tablet Take 2.5 mg by mouth daily.   Yes Historical Provider, MD  aspirin 81 MG tablet Take 81 mg by mouth daily.   Yes Historical Provider, MD  atorvastatin (LIPITOR) 20 MG tablet Take 20 mg by mouth daily.   Yes Historical Provider, MD  cetirizine (ZYRTEC) 5 MG tablet Take 5 mg by mouth daily.   Yes Historical Provider, MD  chlorhexidine (PERIDEX) 0.12 % solution Use as directed 15 mLs in the mouth or throat 2 (two) times daily.   Yes Historical Provider, MD  cyanocobalamin 100 MCG tablet Take 100 mcg by mouth daily.   Yes Historical Provider, MD  dexlansoprazole (DEXILANT) 60 MG capsule Take 60 mg by mouth daily.   Yes Historical Provider, MD  dutasteride (AVODART) 0.5 MG capsule Take 0.5 mg by mouth daily.   Yes Historical Provider, MD  latanoprost (XALATAN) 0.005 % ophthalmic solution Place 1 drop into both eyes at bedtime.   Yes Historical Provider, MD    Multiple Vitamins-Minerals (MULTIVITAMIN WITH MINERALS) tablet Take 1 tablet by mouth daily.   Yes Historical Provider, MD  nitroGLYCERIN (NITROSTAT) 0.4 MG SL tablet Place 0.4 mg under the tongue every 5 (five) minutes as needed for chest pain.   Yes Historical Provider, MD  timolol (BETIMOL) 0.25 % ophthalmic solution Place 1 drop into both eyes daily.   Yes Historical Provider, MD  triamcinolone cream (KENALOG) 0.1 % Apply 1 application topically 2 (two) times daily as needed (Foot).   Yes Historical Provider, MD    Inpatient Medications:  . amiodarone  400 mg Oral BID  . antiseptic oral rinse  7 mL Mouth Rinse BID  . aspirin EC  325 mg Oral Daily  . atorvastatin  20 mg Oral Daily  . docusate sodium  200 mg Oral Daily  . dutasteride  0.5 mg Oral QHS  . latanoprost  1 drop Both  Eyes QHS  . metoprolol tartrate  25 mg Oral BID  . pantoprazole  40 mg Oral QAC breakfast  . sodium chloride  3 mL Intravenous Q12H  . timolol  1 drop Both Eyes Daily  . warfarin  5 mg Oral Once  . [START ON 01/26/2015] warfarin  5 mg Oral q1800  . Warfarin - Physician Dosing Inpatient   Does not apply q1800      Allergies:  Allergies  Allergen Reactions  . Oxycodone Other (See Comments)    hypotention  . Sulfa Antibiotics Rash    History   Social History  . Marital Status: Married    Spouse Name: N/A  . Number of Children: N/A  . Years of Education: N/A   Occupational History  . Not on file.   Social History Main Topics  . Smoking status: Never Smoker   . Smokeless tobacco: Not on file  . Alcohol Use: No  . Drug Use: No  . Sexual Activity: Not on file   Other Topics Concern  . Not on file   Social History Narrative     Family History  Problem Relation Age of Onset  . Parkinsonism Mother   . Kidney disease Father   . Cancer Father     prostate  . Heart disease Father   . Cancer Paternal Uncle     esophageal  . Cancer Paternal Uncle      Review of Systems:see above.  All  other systems reviewed and are otherwise negative except as noted above.  Labs:  Lab Results  Component Value Date   WBC 9.8 01/25/2015   HGB 11.3* 01/25/2015   HCT 33.9* 01/25/2015   MCV 96.3 01/25/2015   PLT 257 01/25/2015    Recent Labs Lab 01/25/15 0400  NA 140  K 3.5  CL 105  CO2 27  BUN 20  CREATININE 1.18  CALCIUM 8.7*  GLUCOSE 118*    Radiology/Studies:  Dg Chest 2 View  01/22/2015   CLINICAL DATA:  Status post CABG on January 19, 2015  EXAM: CHEST  2 VIEW  COMPARISON:  Portable chest x-ray of January 20, 2014.  FINDINGS: The left lung is mildly hyperinflated. There is left lower lobe atelectasis medially. There is trace of pleural fluid on the left. On the right there is a small pleural effusion and there is fluid in the minor fissure. The cardiac silhouette is mildly enlarged but has become less conspicuous since the previous study. The heart borders are sharper. The pulmonary vascularity is less engorged. There are 7 intact sternal wires. No acute bony abnormality is observed.  IMPRESSION: Small bilateral pleural effusions greater on the right than on the left. There is persistent left lower lobe atelectasis. Improved appearance of the pulmonary interstitium consistent with decreasing pulmonary interstitial edema.   Electronically Signed   By: David  Martinique M.D.   On: 01/22/2015 07:38   Dg Chest 2 View  01/15/2015   CLINICAL DATA:  Coronary artery disease, CABG planned  EXAM: CHEST  2 VIEW  COMPARISON:  None.  FINDINGS: The lungs are adequately inflated. There is minimal bibasilar subsegmental atelectasis or scarring. The heart and pulmonary vascularity are normal. The mediastinum is normal in width. There is mild tortuosity of the descending thoracic aorta. There is no pleural effusion or pneumothorax. There is mild multilevel degenerative disc disease of the thoracic spine.  IMPRESSION: COPD with probable bibasilar scarring. There is no CHF, pneumonia, nor other acute cardiopulmonary  abnormality.  Electronically Signed   By: David  Martinique M.D.   On: 01/15/2015 10:09   Dg Chest Port 1 View  01/21/2015   CLINICAL DATA:  Status post CABG  EXAM: PORTABLE CHEST - 1 VIEW  COMPARISON:  Portable chest x-ray of January 20, 2015  FINDINGS: There has been interval removal of the left chest tube and mediastinal drain. No definite pneumothorax is observed. There is increased density in the retrocardiac region consistent with atelectasis. This is not greatly changed from yesterday's study. Traces of pleural fluid on the left is suspected. The right lung is adequately inflated. There is no focal infiltrate. The Swan-Ganz catheter has been removed. The right internal jugular Cordis sheath persists. The cardiac silhouette remains enlarged. The pulmonary vascularity is not engorged.  IMPRESSION: Persistent left lower lobe atelectasis and trace pleural effusion without evidence of a pneumothorax following chest tube and mediastinal drain removal. There is stable enlargement cardiac silhouette without significant pulmonary vascular congestion.   Electronically Signed   By: David  Martinique M.D.   On: 01/21/2015 07:46   Dg Chest Port 1 View  01/20/2015   CLINICAL DATA:  Coronary artery disease ; status post coronary artery bypass grafting  EXAM: PORTABLE CHEST - 1 VIEW  COMPARISON:  January 19, 2015  FINDINGS: Endotracheal tube and nasogastric tube have been removed. Swan-Ganz catheter tip is in the distal right main pulmonary artery, stable. There is a mediastinal drain and a left chest tube. There is no appreciable pneumothorax. There is mild left base atelectatic change. Lungs elsewhere clear. Heart is upper normal in size with pulmonary vascularity within normal limits. No adenopathy.  IMPRESSION: Tube and catheter positions as described without pneumothorax. Mild left base atelectasis. No edema or consolidation.   Electronically Signed   By: Lowella Grip III M.D.   On: 01/20/2015 07:19   Dg Chest Port 1  View  01/19/2015   CLINICAL DATA:  74 year old male status post CABG. Initial encounter.  EXAM: PORTABLE CHEST - 1 VIEW  COMPARISON:  Preoperative chest radiographs 01/15/2015  FINDINGS: Portable AP semi upright view at 1348 hrs.  Endotracheal tube tip at the level the clavicles. Enteric tube courses to the left upper quadrant, tip not included. Left chest tube in place. Mediastinal drain in place. Right IJ approach Swan-Ganz catheter, tip at the proximal right lower lobe pulmonary artery level.  Sequelae of CABG. Lower lung volumes. No pneumothorax or acute pulmonary edema. Small left pleural effusion suspected. Other left lung base opacity favored due to atelectasis. Stable cardiac size and mediastinal contours.  IMPRESSION: 1. Lines and tubes appear appropriately placed. 2. No pneumothorax or edema. Left lung atelectasis and suspected small pleural effusion.   Electronically Signed   By: Genevie Ann M.D.   On: 01/19/2015 14:03    Wt Readings from Last 3 Encounters:  01/25/15 225 lb 4.8 oz (102.195 kg)  01/15/15 226 lb 12.8 oz (102.876 kg)  01/13/15 225 lb (102.059 kg)   EKG:   7/9 at 4:10: appears to be atrial fib 112bpm with RBBB (no clear cut flutter waves compared to following EKG) 7/9 at 16:17: atrial flutter 105bpm with variable AV block, RBBB  Physical Exam: Blood pressure 92/58, pulse 108, temperature 98.4 F (36.9 C), temperature source Oral, resp. rate 18, height 6\' 3"  (1.905 m), weight 225 lb 4.8 oz (102.195 kg), SpO2 94 %. General: Well developed, well nourished , in no acute distress. Head: Normocephalic, atraumatic, sclera non-icteric, no xanthomas, nares are without discharge.  Neck: Negative  for carotid bruits. JVD not elevated. Lungs: Clear bilaterally to auscultation without wheezes, rales, or rhonchi. Breathing is unlabored. Heart: Irregularly irregular, rate controlled with S1 S2. No rubs gallops appreciated. Soft SEM at apex. Well healing sternal incision Abdomen: Soft,  non-tender, non-distended with normoactive bowel sounds. No hepatomegaly. No rebound/guarding. No obvious abdominal masses. Msk:  Strength and tone appear normal for age. Extremities: No clubbing or cyanosis. No edema.  Distal pedal pulses are 2+ and equal bilaterally. Neuro: Alert and oriented X 3. No facial asymmetry. No focal deficit. Moves all extremities spontaneously. Psych:  Responds to questions appropriately with a normal affect.    Assessment and Plan:   1. 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 Post-op atrial fib/flutter 3. RBBB with h/o sinus bradycardia 4. H/o borderline HTN with softer BPs this admission 5. H/o GERD/GIB remotely without recurrence, maintained on PPI  BP is currently prohibiting more aggressive rate control. It is also important to note that his sinus rate is in the 50s-60s. He was started on Coumadin today. I clarified with neurology that he is safe to do so given h/o meningioma - they do not think it precludes use. He has not been on heparin. The patient is not particularly symptomatic at present time but does report feeling "wiped out" after some activities. Per discussion with Dr. Harrington Challenger, would continue current regimen for now as we do believe he will eventually convert with the amio load he has been given. We would feel more comfortable if he was fully anticoagulated thus have placed a call to the CVTS PA who plans discuss with Dr. Servando Snare.  Will obtain a complete 2D echocardiogram. See below for additional thoughts.  SignedMelina Copa PA-C 01/25/2015, 1:52 PM Pager: [phone]  Patinet seen and examined  I agree with findings as noted by D Dunn above Pt has been in atrial fib for about 48 hours.  Currently on 400 bid of amiodarone  SBP about 100.   He should convert to SR on this dose of amiodarone.  May take some time He notes some fatigue but I am not convinced he is symptomatic with his afib. WOuld recomm heparin until INR > or = to 2.    Dorris Carnes

## 2015-01-25 NOTE — Care Management (Signed)
Important Message  Patient Details  Name: Bradley [name] MRN: [id number] Date of Birth: [birth year]   Medicare Important Message Given:  Yes-third notification given    Nathen May 01/25/2015, 2:16 PM

## 2015-01-25 NOTE — Progress Notes (Signed)
Utilization review completed.  

## 2015-01-26 ENCOUNTER — Ambulatory Visit (HOSPITAL_COMMUNITY): Payer: Medicare Other

## 2015-01-26 DIAGNOSIS — I4892 Unspecified atrial flutter: Secondary | ICD-10-CM

## 2015-01-26 DIAGNOSIS — I4891 Unspecified atrial fibrillation: Secondary | ICD-10-CM

## 2015-01-26 LAB — BASIC METABOLIC PANEL
Anion gap: 8 (ref 5–15)
BUN: 18 mg/dL (ref 6–20)
CO2: 25 mmol/L (ref 22–32)
Calcium: 8.7 mg/dL — ABNORMAL LOW (ref 8.9–10.3)
Chloride: 104 mmol/L (ref 101–111)
Creatinine, Ser: 1.26 mg/dL — ABNORMAL HIGH (ref 0.61–1.24)
GFR calc Af Amer: >60 mL/min (ref >60)
GFR calc non Af Amer: 54 mL/min — ABNORMAL LOW (ref >60)
Glucose, Bld: 112 mg/dL — ABNORMAL HIGH (ref 65–99)
Potassium: 3.5 mmol/L (ref 3.5–5.1)
Sodium: 137 mmol/L (ref 135–145)

## 2015-01-26 LAB — PROTIME-INR
INR: 1.3 (ref 0.00–1.49)
Prothrombin Time: 16.3 seconds — ABNORMAL HIGH (ref 11.6–15.2)

## 2015-01-26 MED ORDER — HYDROCORTISONE ACE-PRAMOXINE 1-1 % RE FOAM
1.0000 | Freq: Three times a day (TID) | RECTAL | Status: DC
  Administered 2015-01-26 – 2015-01-28 (×3): 1 via RECTAL
  Filled 2015-01-26 (×3): qty 10

## 2015-01-26 MED ORDER — ENSURE ENLIVE PO LIQD
237.0000 mL | Freq: Three times a day (TID) | ORAL | Status: DC
  Administered 2015-01-26 – 2015-01-29 (×6): 237 mL via ORAL

## 2015-01-26 MED ORDER — HYDROCORTISONE 2.5 % RE CREA: TOPICAL_CREAM | Freq: Three times a day (TID) | RECTAL | Status: DC | PRN

## 2015-01-26 NOTE — Progress Notes (Signed)
1040 Pt up with RN. Will follow up later for ambulation. Graylon Good RN BSN 01/26/2015 10:41 AM

## 2015-01-26 NOTE — Progress Notes (Signed)
   Subjective: Sore  Feels blah  No CP  No SOB   Objective: Filed Vitals:   01/25/15 1333 01/25/15 1455 01/25/15 2026 01/26/15 0418  BP: 92/58 105/58 125/81 103/66  Pulse: 108  104 80  Temp: 98.4 F (36.9 C)  98.3 F (36.8 C) 97.7 F (36.5 C)  TempSrc: Oral  Oral Oral  Resp: 18  18 18   Height:      Weight:    224 lb 10.4 oz (101.9 kg)  SpO2:   96% 94%   Weight change: -10.4 oz (-0.295 kg)  Intake/Output Summary (Last 24 hours) at 01/26/15 0837 Last data filed at 01/26/15 0300  Gross per 24 hour  Intake      3 ml  Output    100 ml  Net    -97 ml    General: Alert, awake, oriented x3, in no acute distress Neck:  JVP is normal Heart: Regular rate and rhythm, without murmurs, rubs, gallops.  Lungs: Clear to auscultation.  No rales or wheezes. Exemities:  Tr edema.   Neuro: Grossly intact, nonfocal.  Tele:  Atrial flutter  100s   Lab Results: Results for orders placed or performed during the hospital encounter of 01/19/15 (from the past 24 hour(s))  Basic metabolic panel     Status: Abnormal   Collection Time: 01/26/15  3:52 AM  Result Value Ref Range   Sodium 137 135 - 145 mmol/L   Potassium 3.5 3.5 - 5.1 mmol/L   Chloride 104 101 - 111 mmol/L   CO2 25 22 - 32 mmol/L   Glucose, Bld 112 (H) 65 - 99 mg/dL   BUN 18 6 - 20 mg/dL   Creatinine, Ser 1.26 (H) 0.61 - 1.24 mg/dL   Calcium 8.7 (L) 8.9 - 10.3 mg/dL   GFR calc non Af Amer 54 (L) >60 mL/min   GFR calc Af Amer >60 >60 mL/min   Anion gap 8 5 - 15  Protime-INR     Status: Abnormal   Collection Time: 01/26/15  3:52 AM  Result Value Ref Range   Prothrombin Time 16.3 (H) 11.6 - 15.2 seconds   INR 1.30 0.00 - 1.49    Studies/Results: No results found.  Medications: Reviewed   @PROBHOSP @  1  Rhythm  Still in atrila fib/flutter.  On lovenox/coumadin/amiodarone.   If does not convert would plan TEE cardioverson tomorrow. Echo today.  2.  CAD  Post op Day 7.    LOS: 7 days   Dorris Carnes 01/26/2015, 8:37  AM

## 2015-01-26 NOTE — Progress Notes (Signed)
Pt ambulated with NT on RA using walker. Pt tolerated well; no complaints. Pt returned to chair in room with call bell within reach. RN will cont to monitor.

## 2015-01-26 NOTE — Care Management Note (Signed)
Case Management Note Marvetta Gibbons RN, BSN Unit 2W-Case Manager [phone]  Patient Details  Name: Bradley [name] MRN: [id number] Date of Birth: 04/19/1941  Subjective/Objective:    Pt admitted s/p CABG x4                Action/Plan: PTA pt lived at home with spouse- anticipate return home  Expected Discharge Date:                  Expected Discharge Plan:  [name]  In-House Referral:     Discharge planning Services     Post Acute Care Choice:    Choice offered to:     DME Arranged:    DME Agency:     HH Arranged:    HH Agency:     Status of Service:  In process, will continue to follow  Medicare Important Message Given:  Yes-third notification given Date Medicare IM Given:    Medicare IM give by:    Date Additional Medicare IM Given:    Additional Medicare Important Message give by:     If discussed at [place] of Stay Meetings, dates discussed:   01/26/15  Additional Comments:  01/26/15- pt with post op afib- IV amio changed to po and plan to remove EPW today. Waiting for INR to be therapeutic   Dawayne Patricia, RN- [phone] 01/26/2015, 2:20 PM

## 2015-01-26 NOTE — Progress Notes (Addendum)
      Bradley [name],Bradley [name]             [phone]      7 Days Post-Op Procedure(s) (LRB): CORONARY ARTERY BYPASS GRAFTING (CABG), with LIM to LAD, vein to left intermediate, vein to PD, vein to OM, using right greater saphenous vein via endovein harvest. (N/A) TRANSESOPHAGEAL ECHOCARDIOGRAM (TEE) (N/A) ENDOVEIN HARVEST OF GREATER SAPHENOUS VEIN (Right)   Subjective:  Bradley [name] continues to states he doesn't feel good.  He has no specific complaints.  Objective: Vital signs in last 24 hours: Temp:  [97.7 F (36.5 C)-98.4 F (36.9 C)] 97.7 F (36.5 C) (07/12 0418) Pulse Rate:  [80-108] 80 (07/12 0418) Cardiac Rhythm:  [-] Atrial fibrillation (07/11 1946) Resp:  [18] 18 (07/12 0418) BP: (92-125)/(58-81) 103/66 mmHg (07/12 0418) SpO2:  [94 %-96 %] 94 % (07/12 0418) Weight:  [224 lb 10.4 oz (101.9 kg)] 224 lb 10.4 oz (101.9 kg) (07/12 0418)  Intake/Output from previous day: 07/11 0701 - 07/12 0700 In: 243 [P.O.:240; I.V.:3] Out: 100 [Urine:100]  General appearance: alert, cooperative and no distress Heart: irregularly irregular rhythm Lungs: clear to auscultation bilaterally Abdomen: soft, non-tender; bowel sounds normal; no masses,  no organomegaly Extremities: edema minimal to none Wound: clean and dry  Lab Results:  Recent Labs  01/25/15 0400  WBC 9.8  HGB 11.3*  HCT 33.9*  PLT 257   BMET:  Recent Labs  01/25/15 0400 01/26/15 0352  NA 140 137  K 3.5 3.5  CL 105 104  CO2 27 25  GLUCOSE 118* 112*  BUN 20 18  CREATININE 1.18 1.26*  CALCIUM 8.7* 8.7*    PT/INR:  Recent Labs  01/26/15 0352  LABPROT 16.3*  INR 1.30   ABG    Component Value Date/Time   PHART 7.327* 01/19/2015 1849   HCO3 23.2 01/19/2015 1849   TCO2 20 01/20/2015 1607   ACIDBASEDEF 3.0* 01/19/2015 1849   O2SAT 94.0 01/19/2015 1849   CBG (last 3)  No results for input(s): GLUCAP in the last 72 hours.  Assessment/Plan: S/P Procedure(s)  (LRB): CORONARY ARTERY BYPASS GRAFTING (CABG), with LIM to LAD, vein to left intermediate, vein to PD, vein to OM, using right greater saphenous vein via endovein harvest. (N/A) TRANSESOPHAGEAL ECHOCARDIOGRAM (TEE) (N/A) ENDOVEIN HARVEST OF GREATER SAPHENOUS VEIN (Right)  1. CV- remains in rate controlled Atrial Fibrillation- lopressor, Amiodarone 2. INR 1.30, continue coumadin at 5 mg daily, Heparin for now will d/c once INR is close to 2 3. Renal- creatinine mildly increased today, no significant edema, weight is stable 4. GU- voiding issues seem to resolved 5. Dispo- patient stable, continued rate control A. Fib will continue Coumadin,Amiodarone, will d/c EPW   LOS: 7 days    BARRETT, [name] 01/26/2015  Still in afib. Feels better today On lovenox since yesterday and started on coumadin Poss cardioversion tomorrow. I have seen and examined Bradley [name] and agree with the above assessment  and plan.  Grace Isaac MD Beeper [phone] Office [phone] 01/26/2015 5:31 PM

## 2015-01-26 NOTE — Progress Notes (Signed)
  Echocardiogram 2D Echocardiogram has been performed.  Diamond Nickel 01/26/2015, 2:47 PM

## 2015-01-26 NOTE — Progress Notes (Signed)
EPW removed. VSS. Vitals take 15 times 4. Patient tolerated well. Patient educated on maintaining bedrest for 1 hour. Will continue to monitor.  Domingo Dimes RN

## 2015-01-26 NOTE — Progress Notes (Signed)
CARDIAC REHAB PHASE I   PRE:  Rate/Rhythm: 92 afib  BP:  Supine:   Sitting: 120/70  Standing:    SaO2: 100%RA  MODE:  Ambulation: 550 ft   POST:  Rate/Rhythm: 115 afib  BP:  Supine:   Sitting: 128/75  Standing:    SaO2: 99%RA 1510-1532 Pt walked 550 ft on RA with handheld asst for 400 ft and then walker rest of way. Tolerated well. Stated he feels the best he has yet. To recliner after walk.   Graylon Good, RN BSN  01/26/2015 3:29 PM

## 2015-01-27 ENCOUNTER — Encounter (HOSPITAL_COMMUNITY)
Admission: RE | Disposition: A | Discharge status: Home / Self Care | Payer: Self-pay | Source: Ambulatory Visit | Attending: Cardiothoracic Surgery

## 2015-01-27 ENCOUNTER — Inpatient Hospital Stay (HOSPITAL_COMMUNITY): Payer: Medicare Other | Admitting: Anesthesiology

## 2015-01-27 ENCOUNTER — Inpatient Hospital Stay (HOSPITAL_COMMUNITY): Payer: Medicare Other

## 2015-01-27 ENCOUNTER — Encounter (HOSPITAL_COMMUNITY): Payer: Self-pay

## 2015-01-27 LAB — CBC
HCT: 32.3 % — ABNORMAL LOW (ref 39.0–52.0)
Hemoglobin: 10.7 g/dL — ABNORMAL LOW (ref 13.0–17.0)
MCH: 31.6 pg (ref 26.0–34.0)
MCHC: 33.1 g/dL (ref 30.0–36.0)
MCV: 95.3 fL (ref 78.0–100.0)
Platelets: 338 10*3/uL (ref 150–400)
RBC: 3.39 MIL/uL — ABNORMAL LOW (ref 4.22–5.81)
RDW: 13.9 % (ref 11.5–15.5)
WBC: 11.3 10*3/uL — ABNORMAL HIGH (ref 4.0–10.5)

## 2015-01-27 LAB — BASIC METABOLIC PANEL
Anion gap: 8 (ref 5–15)
BUN: 21 mg/dL — ABNORMAL HIGH (ref 6–20)
CO2: 28 mmol/L (ref 22–32)
Calcium: 8.7 mg/dL — ABNORMAL LOW (ref 8.9–10.3)
Chloride: 102 mmol/L (ref 101–111)
Creatinine, Ser: 1.48 mg/dL — ABNORMAL HIGH (ref 0.61–1.24)
GFR calc Af Amer: 52 mL/min — ABNORMAL LOW (ref >60)
GFR calc non Af Amer: 45 mL/min — ABNORMAL LOW (ref >60)
Glucose, Bld: 114 mg/dL — ABNORMAL HIGH (ref 65–99)
Potassium: 3.8 mmol/L (ref 3.5–5.1)
Sodium: 138 mmol/L (ref 135–145)

## 2015-01-27 LAB — PROTIME-INR
INR: 1.31 (ref 0.00–1.49)
PROTHROMBIN TIME: 16.4 s — AB (ref 11.6–15.2)

## 2015-01-27 SURGERY — CANCELLED PROCEDURE
Anesthesia: Monitor Anesthesia Care

## 2015-01-27 MED ORDER — SODIUM CHLORIDE 0.9 % IV SOLN
INTRAVENOUS | Status: AC
  Administered 2015-01-27: 11:00:00 via INTRAVENOUS

## 2015-01-27 MED ORDER — LIDOCAINE HCL (CARDIAC) 20 MG/ML IV SOLN
INTRAVENOUS | Status: DC | PRN
  Administered 2015-01-27: 30 mg via INTRAVENOUS

## 2015-01-27 MED ORDER — LIDOCAINE VISCOUS 2 % MT SOLN
OROMUCOSAL | Status: DC | PRN
  Administered 2015-01-27: 1 via OROMUCOSAL

## 2015-01-27 MED ORDER — SODIUM CHLORIDE 0.9 % IV SOLN: INTRAVENOUS | Status: DC | PRN

## 2015-01-27 MED ORDER — SODIUM CHLORIDE 0.9 % IV SOLN: INTRAVENOUS | Status: DC

## 2015-01-27 MED ORDER — PHENYLEPHRINE HCL 10 MG/ML IJ SOLN
10.0000 mg | INTRAVENOUS | Status: DC | PRN
  Administered 2015-01-27: 15 ug/min via INTRAVENOUS

## 2015-01-27 MED ORDER — PROPOFOL 10 MG/ML IV BOLUS
INTRAVENOUS | Status: DC | PRN
  Administered 2015-01-27: 20 mg via INTRAVENOUS
  Administered 2015-01-27: 30 mg via INTRAVENOUS
  Administered 2015-01-27: 20 mg via INTRAVENOUS
  Administered 2015-01-27 (×2): 30 mg via INTRAVENOUS

## 2015-01-27 MED ORDER — LACTATED RINGERS IV SOLN
INTRAVENOUS | Status: DC | PRN
  Administered 2015-01-27: 1000 mL
  Administered 2015-01-27: 14:00:00 via INTRAVENOUS

## 2015-01-27 MED ORDER — DILTIAZEM HCL 100 MG IV SOLR
10.0000 mg/h | INTRAVENOUS | Status: DC
  Administered 2015-01-27 – 2015-01-28 (×3): 10 mg/h via INTRAVENOUS
  Filled 2015-01-27 (×2): qty 100

## 2015-01-27 MED ORDER — POLYETHYLENE GLYCOL 3350 17 G PO PACK
17.0000 g | PACK | Freq: Every day | ORAL | Status: DC
  Filled 2015-01-27 (×4): qty 1

## 2015-01-27 MED ORDER — LIDOCAINE VISCOUS 2 % MT SOLN
OROMUCOSAL | Status: AC
  Filled 2015-01-27: qty 15

## 2015-01-27 MED ORDER — DILTIAZEM LOAD VIA INFUSION
10.0000 mg | Freq: Once | INTRAVENOUS | Status: AC
  Administered 2015-01-27: 10 mg via INTRAVENOUS
  Filled 2015-01-27: qty 10

## 2015-01-27 MED ORDER — PHENYLEPHRINE HCL 10 MG/ML IJ SOLN
INTRAMUSCULAR | Status: DC | PRN
  Administered 2015-01-27 (×5): 80 ug via INTRAVENOUS
  Administered 2015-01-27: 120 ug via INTRAVENOUS
  Administered 2015-01-27 (×3): 80 ug via INTRAVENOUS

## 2015-01-27 MED ORDER — BUTAMBEN-TETRACAINE-BENZOCAINE 2-2-14 % EX AERO
INHALATION_SPRAY | CUTANEOUS | Status: DC | PRN
  Administered 2015-01-27: 2 via TOPICAL

## 2015-01-27 NOTE — Progress Notes (Addendum)
Patient states he felt like he was in a "black hole" after given Diprivan in order to do the TEE to undergo cardioversion. He developed hypotension after the procedure. Procedure was aborted. He was started on Neo Synephrine drip (at 10 which was then turned down to 5). Neo Synephrine has been turned off. BP now 130/97 with HR in the 110's (a fib). Patient is awake, alert, oriented. He is asking for water and to get out of bed. Patient's only complaint is a sore throat and he feels as though "a nut is stuck in his throat and he is unable to bring it up or swallow it". As discussed with Dr. Servando Snare, will order swallow study to rule out esophageal perforation. Patient has had EGD and in the past but no dilatation. He will remain NPO for now but is able to get out of bed.  Patient seen and plan swallow to ro perforation, patient complains of walnut in his throat I have seen and examined [name] and agree with the above assessment  and plan.  Grace Isaac MD Beeper [phone] Office [phone]

## 2015-01-27 NOTE — Interval H&P Note (Signed)
History and Physical Interval Note:  01/27/2015 2:12 PM  Bradley [name]  has presented today for surgery, with the diagnosis of afib  The various methods of treatment have been discussed with the patient and family. After consideration of risks, benefits and other options for treatment, the patient has consented to  Procedure(s): TRANSESOPHAGEAL ECHOCARDIOGRAM (TEE) (N/A) CARDIOVERSION (N/A) as a surgical intervention .  The patient's history has been reviewed, patient examined, no change in status, stable for surgery.  I have reviewed the patient's chart and labs.  Questions were answered to the patient's satisfaction.     [name]

## 2015-01-27 NOTE — Anesthesia Postprocedure Evaluation (Signed)
  Anesthesia Post-op Note  Patient: Bradley [name]  Procedure(s) Performed: Procedure(s) with comments: TRANSESOPHAGEAL ECHOCARDIOGRAM (TEE) (N/A) CARDIOVERSION (N/A) CANCELLED PROCEDURE - Unable to pass TEE probe  Patient Location: PACU  Anesthesia Type: MAC   Level of Consciousness: awake, alert  and oriented  Airway and Oxygen Therapy: Patient Spontanous Breathing  Post-op Pain: mild  Post-op Assessment: Post-op Vital signs reviewed  Post-op Vital Signs: Reviewed  Last Vitals:  Filed Vitals:   01/27/15 1312  BP: 125/82  Pulse: 108  Temp:   Resp: 16    Complications: No apparent anesthesia complications

## 2015-01-27 NOTE — Progress Notes (Signed)
Pt to recovery; CRNA and Anesthesiologist at bedside (see notes for medications given); pt hypotensive (see flowsheets); Dr Radford Pax requested bed on 2South; report called to [place], [place]; pt transferred to 2S09 on monitor (accompanied by Joellen Jersey, RN & Cassandria Santee, CRNA); pt stable upon arrival

## 2015-01-27 NOTE — Transfer of Care (Signed)
Immediate Anesthesia Transfer of Care Note  Patient: Bradley [name]  Procedure(s) Performed: Procedure(s) with comments: CANCELLED PROCEDURE - Unable to pass TEE probe  Patient Location: Endoscopy Unit  Anesthesia Type:MAC  Level of Consciousness: awake, alert  and oriented  Airway & Oxygen Therapy: Patient Spontanous Breathing and Patient connected to nasal cannula oxygen  Post-op Assessment: Report given to RN, Post -op Vital signs reviewed and unstable, Anesthesiologist notified and Dr. Al Corpus at bedside, BP stable with boluses of neo  Post vital signs: Reviewed and stable  Last Vitals:  Filed Vitals:   01/27/15 1515  BP: 130/59  Pulse: 91  Temp:   Resp: 16    Complications: hypotension

## 2015-01-27 NOTE — OR Nursing (Signed)
Unable to pass TEE probe by Dr. Radford Pax.  Dr. Hilarie Fredrickson from GI attempted to pass probe.  TEE aborted

## 2015-01-27 NOTE — Progress Notes (Signed)
      [address]       [place] [address]             [phone]      Events of earlier today noted and discussed with Dr. Servando Snare  BP 130/102 mmHg  Pulse 139  Temp(Src) 98.1 F (36.7 C) (Oral)  Resp 21  Ht 6\' 3"  (1.905 m)  Wt 225 lb 3.2 oz (102.15 kg)  BMI 28.15 kg/m2  SpO2 100%   Intake/Output Summary (Last 24 hours) at 01/27/15 1855 Last data filed at 01/27/15 1438  Gross per 24 hour  Intake    903 ml  Output    200 ml  Net    703 ml    Feels better now  Still in a fib with VR of 115  To have swallow  Remo Lipps C. Roxan Hockey, MD [facility] [phone]

## 2015-01-27 NOTE — Progress Notes (Addendum)
      [address]       [place] [address]             [phone]      8 Days Post-Op Procedure(s) (LRB): CORONARY ARTERY BYPASS GRAFTING (CABG), with LIM to LAD, vein to left intermediate, vein to PD, vein to OM, using right greater saphenous vein via endovein harvest. (N/A) TRANSESOPHAGEAL ECHOCARDIOGRAM (TEE) (N/A) ENDOVEIN HARVEST OF GREATER SAPHENOUS VEIN (Right)   Subjective:  Mr. Bradley [name] states he doesn't know now he feels.  He states he hasn't pooped in several days. However after reviewing his chart last BM was on the 11th.  He is also concerned about getting a bed sore.  Objective: Vital signs in last 24 hours: Temp:  [98.1 F (36.7 C)-98.3 F (36.8 C)] 98.3 F (36.8 C) (07/13 0438) Pulse Rate:  [74-106] 74 (07/13 0438) Cardiac Rhythm:  [-] Atrial fibrillation (07/12 2000) Resp:  [19-20] 19 (07/13 0438) BP: (97-132)/(43-89) 97/48 mmHg (07/13 0438) SpO2:  [95 %-98 %] 95 % (07/13 0438) Weight:  [225 lb 3.2 oz (102.15 kg)] 225 lb 3.2 oz (102.15 kg) (07/13 0438)  Intake/Output from previous day: 07/12 0701 - 07/13 0700 In: 243 [P.O.:240; I.V.:3] Out: 200 [Urine:200]  General appearance: alert, cooperative and no distress Heart: irregularly irregular rhythm Lungs: clear to auscultation bilaterally Abdomen: soft, non-tender; bowel sounds normal; no masses,  no organomegaly Extremities: edema trace Wound: clean and dry  Lab Results:  Recent Labs  01/25/15 0400 01/27/15 0520  WBC 9.8 11.3*  HGB 11.3* 10.7*  HCT 33.9* 32.3*  PLT 257 338   BMET:  Recent Labs  01/26/15 0352 01/27/15 0520  NA 137 138  K 3.5 3.8  CL 104 102  CO2 25 28  GLUCOSE 112* 114*  BUN 18 21*  CREATININE 1.26* 1.48*  CALCIUM 8.7* 8.7*    PT/INR:  Recent Labs  01/27/15 0520  LABPROT 16.4*  INR 1.31   ABG    Component Value Date/Time   PHART 7.327* 01/19/2015 1849   HCO3 23.2 01/19/2015 1849   TCO2 20 01/20/2015 1607   ACIDBASEDEF 3.0* 01/19/2015 1849     O2SAT 94.0 01/19/2015 1849   CBG (last 3)  No results for input(s): GLUCAP in the last 72 hours.  Assessment/Plan: S/P Procedure(s) (LRB): CORONARY ARTERY BYPASS GRAFTING (CABG), with LIM to LAD, vein to left intermediate, vein to PD, vein to OM, using right greater saphenous vein via endovein harvest. (N/A) TRANSESOPHAGEAL ECHOCARDIOGRAM (TEE) (N/A) ENDOVEIN HARVEST OF GREATER SAPHENOUS VEIN (Right)  1. CV- Atrial Fibrillation- on Amiodarone, Lopressor- scheduled for cardioversion today 2. INR 1.31- will repeat 5 mg today if no response can increase dose tomorrow 3. Pulm- no acute issues, continue IS 4. Renal- creatinine up to 1.48, weight remains stable off diuretics 5. GU- patient with episode of hematuria last night, states clot was present in urinal, this has since resolved, continue Avodart 6. GI- complains of hemorrhoids/ needing to move bowls- anusol has been ordered, will add Miralax daily, Lactulose is ordered prn, continue Colace, Senokot 7. Dispo- patient stable, cardioversion possible today, continue Coumadin   LOS: 8 days    [name], Bradley 01/27/2015  Coumadin started on Lovenox 90 mg bid Pacing wires are out For cardioversion today I have seen and examined [name] and agree with the above assessment  and plan.  Grace Isaac MD Beeper [phone] Office [phone] 01/27/2015 12:52 PM

## 2015-01-27 NOTE — CV Procedure (Signed)
PROCEDURE NOTE  Procedure:  Transesophageal echocardiogram Operator:  Fransico Him, MD Indications:  Atrial fibrillation Complications: inability to pass TEE probe IV Meds:  The patient was sedated using Diprovan by Anesthesia.  The patient had a significant amount of respiratory secretions requiring frequent suctioning.  Multiple attempts were made at intubating the esophagus without any success.  GI was consulted and attempts by GI to pass the probe were unsuccessful as well.  TEE had been performed by anesthesia on 7/5 during cardiac surgery and patient was complaining of throat pain after.  He has a history of Hiatal hernia and Barrett's esophagus and has history of hiatal hernia repair.  Given history of esophageal issues in the past and increased respiratory secretions with difficulty intubating esophagus,  Decision was made to abort the TEE procedure.  This was discussed with Dr. Harrington Challenger who had ordered the study.  The patient was transferred to endoscopy recovery and developed hypotension requiring several doses of IV Neosynephrine.  He was subsequently transferred to Cardiac surgical ICU for hemodynamic monitoring.  SBP on arrival was 155mmHg and Neo gtt stopped.  Case was discussed with Dr. Servando Snare. A total of 60 minutes was spent in direct patient care including critical care time with complications of hypotension from [place].   Signed:  Fransico Him, MD [facility] 01/27/2015

## 2015-01-27 NOTE — H&P (View-Only) (Signed)
Patient: Bradley [name] / Admit Date: 01/19/2015 / Date of Encounter: 01/27/2015, 8:50 AM   Subjective: Repeat BP 112/63 this AM. Says he's feeling somewhat better today than he has this week but still not at full energy. No CP or SOB. He laughs because he says today's the first day he's actually had an appetite yet he has to be NPO.  Objective: Telemetry: persistent atrial flutter - HR 80s-120s. HR currently 100. Physical Exam: Blood pressure 97/48, pulse 74, temperature 98.3 F (36.8 C), temperature source Oral, resp. rate 19, height 6\' 3"  (1.905 m), weight 225 lb 3.2 oz (102.15 kg), SpO2 95 %. General: Well developed, well nourished WM, in no acute distress. Head: Normocephalic, atraumatic, sclera non-icteric, no xanthomas, nares are without discharge. Neck: JVP not elevated. Lungs: Clear bilaterally to auscultation without wheezes, rales, or rhonchi. Breathing is unlabored. Heart: Irregularly irregular, rate controlled with S1 S2. No rubs gallops appreciated. Soft SEM at apex. Well healing sternal incision. Abdomen: Soft, non-tender, non-distended with normoactive bowel sounds. No rebound/guarding. Extremities: No clubbing or cyanosis. No edema. Distal pedal pulses are 2+ and equal bilaterally. Neuro: Alert and oriented X 3. Moves all extremities spontaneously. Psych:  Responds to questions appropriately with a normal affect.   Intake/Output Summary (Last 24 hours) at 01/27/15 0850 Last data filed at 01/26/15 2145  Gross per 24 hour  Intake      3 ml  Output    200 ml  Net   -197 ml    Inpatient Medications:  . amiodarone  400 mg Oral BID  . antiseptic oral rinse  7 mL Mouth Rinse BID  . aspirin EC  81 mg Oral Daily  . atorvastatin  20 mg Oral Daily  . docusate sodium  200 mg Oral Daily  . dutasteride  0.5 mg Oral QHS  . enoxaparin (LOVENOX) injection  1 mg/kg (Adjusted) Subcutaneous Q12H  . feeding supplement (ENSURE ENLIVE)  237 mL Oral TID BM  . hydrocortisone-pramoxine  1  applicator Rectal TID  . latanoprost  1 drop Both Eyes QHS  . metoprolol tartrate  25 mg Oral BID  . pantoprazole  40 mg Oral QAC breakfast  . polyethylene glycol  17 g Oral Daily  . sodium chloride  3 mL Intravenous Q12H  . timolol  1 drop Both Eyes Daily  . warfarin  5 mg Oral q1800  . Warfarin - Physician Dosing Inpatient   Does not apply q1800   Infusions:  . sodium chloride      Labs:  Recent Labs  01/25/15 0400 01/26/15 0352 01/27/15 0520  NA 140 137 138  K 3.5 3.5 3.8  CL 105 104 102  CO2 27 25 28   GLUCOSE 118* 112* 114*  BUN 20 18 21*  CREATININE 1.18 1.26* 1.48*  CALCIUM 8.7* 8.7* 8.7*  MG 2.1  --   --    No results for input(s): AST, ALT, ALKPHOS, BILITOT, PROT, ALBUMIN in the last 72 hours.  Recent Labs  01/25/15 0400 01/27/15 0520  WBC 9.8 11.3*  HGB 11.3* 10.7*  HCT 33.9* 32.3*  MCV 96.3 95.3  PLT 257 338   No results for input(s): CKTOTAL, CKMB, TROPONINI in the last 72 hours. Invalid input(s): POCBNP No results for input(s): HGBA1C in the last 72 hours.   Radiology/Studies:  Dg Chest 2 View  01/22/2015   CLINICAL DATA:  Status post CABG on January 19, 2015  EXAM: CHEST  2 VIEW  COMPARISON:  Portable chest x-ray of January 20, 2014.  FINDINGS: The left lung is mildly hyperinflated. There is left lower lobe atelectasis medially. There is trace of pleural fluid on the left. On the right there is a small pleural effusion and there is fluid in the minor fissure. The cardiac silhouette is mildly enlarged but has become less conspicuous since the previous study. The heart borders are sharper. The pulmonary vascularity is less engorged. There are 7 intact sternal wires. No acute bony abnormality is observed.  IMPRESSION: Small bilateral pleural effusions greater on the right than on the left. There is persistent left lower lobe atelectasis. Improved appearance of the pulmonary interstitium consistent with decreasing pulmonary interstitial edema.   Electronically Signed    By: David  Martinique M.D.   On: 01/22/2015 07:38   Dg Chest 2 View  01/15/2015   CLINICAL DATA:  Coronary artery disease, CABG planned  EXAM: CHEST  2 VIEW  COMPARISON:  None.  FINDINGS: The lungs are adequately inflated. There is minimal bibasilar subsegmental atelectasis or scarring. The heart and pulmonary vascularity are normal. The mediastinum is normal in width. There is mild tortuosity of the descending thoracic aorta. There is no pleural effusion or pneumothorax. There is mild multilevel degenerative disc disease of the thoracic spine.  IMPRESSION: COPD with probable bibasilar scarring. There is no CHF, pneumonia, nor other acute cardiopulmonary abnormality.   Electronically Signed   By: David  Martinique M.D.   On: 01/15/2015 10:09   Dg Chest Port 1 View  01/21/2015   CLINICAL DATA:  Status post CABG  EXAM: PORTABLE CHEST - 1 VIEW  COMPARISON:  Portable chest x-ray of January 20, 2015  FINDINGS: There has been interval removal of the left chest tube and mediastinal drain. No definite pneumothorax is observed. There is increased density in the retrocardiac region consistent with atelectasis. This is not greatly changed from yesterday's study. Traces of pleural fluid on the left is suspected. The right lung is adequately inflated. There is no focal infiltrate. The Swan-Ganz catheter has been removed. The right internal jugular Cordis sheath persists. The cardiac silhouette remains enlarged. The pulmonary vascularity is not engorged.  IMPRESSION: Persistent left lower lobe atelectasis and trace pleural effusion without evidence of a pneumothorax following chest tube and mediastinal drain removal. There is stable enlargement cardiac silhouette without significant pulmonary vascular congestion.   Electronically Signed   By: David  Martinique M.D.   On: 01/21/2015 07:46   Dg Chest Port 1 View  01/20/2015   CLINICAL DATA:  Coronary artery disease ; status post coronary artery bypass grafting  EXAM: PORTABLE CHEST - 1 VIEW   COMPARISON:  January 19, 2015  FINDINGS: Endotracheal tube and nasogastric tube have been removed. Swan-Ganz catheter tip is in the distal right main pulmonary artery, stable. There is a mediastinal drain and a left chest tube. There is no appreciable pneumothorax. There is mild left base atelectatic change. Lungs elsewhere clear. Heart is upper normal in size with pulmonary vascularity within normal limits. No adenopathy.  IMPRESSION: Tube and catheter positions as described without pneumothorax. Mild left base atelectasis. No edema or consolidation.   Electronically Signed   By: Lowella Grip III M.D.   On: 01/20/2015 07:19   Dg Chest Port 1 View  01/19/2015   CLINICAL DATA:  74 year old male status post CABG. Initial encounter.  EXAM: PORTABLE CHEST - 1 VIEW  COMPARISON:  Preoperative chest radiographs 01/15/2015  FINDINGS: Portable AP semi upright view at 1348 hrs.  Endotracheal tube tip at the level  the clavicles. Enteric tube courses to the left upper quadrant, tip not included. Left chest tube in place. Mediastinal drain in place. Right IJ approach Swan-Ganz catheter, tip at the proximal right lower lobe pulmonary artery level.  Sequelae of CABG. Lower lung volumes. No pneumothorax or acute pulmonary edema. Small left pleural effusion suspected. Other left lung base opacity favored due to atelectasis. Stable cardiac size and mediastinal contours.  IMPRESSION: 1. Lines and tubes appear appropriately placed. 2. No pneumothorax or edema. Left lung atelectasis and suspected small pleural effusion.   Electronically Signed   By: Genevie Ann M.D.   On: 01/19/2015 14:03     Assessment and Plan  1. 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 Post-op atrial fib/flutter - persistent atrial flutter 3. RBBB with h/o sinus bradycardia 4. H/o borderline HTN with softer BPs this admission 5. H/o GERD/GIB remotely without recurrence, maintained on PPI 6. Acute kidney inujry with rising Cr  2D echo reviewed - possible  inferobasal HK, EF 55%, mild LVH, mild MR, mildly dilated LA/RV/RA. Plan for TEE/DCCV this afternoon. Risks/benefits/alternatives discussed with the patient who wishes to proceed. He is on Lovenox/Coumadin. Wonder if rising Cr is a byproduct of tendency for BP to run softer with higher HR. Hopefully cardioversion should help. Will gently hydrate for 12 hours since he'll be NPO for a good part of the day.  Discussed BB with Dr. Harrington Challenger - given tendency for sinus rate to be slow, will hold further metoprolol this AM. May be able to be added back on depending on how BP and HR do post-DCCV.  Signed, Melina Copa PA-C Pager: [phone]   Patient seen and examined  Agree with assessment of D Dunn above  Plan for TEE cardioversion today.  Dorris Carnes

## 2015-01-27 NOTE — Anesthesia Preprocedure Evaluation (Addendum)
Anesthesia Evaluation  Patient identified by MRN, date of birth, ID band Patient awake    Reviewed: Allergy & Precautions, NPO status , Patient's Chart, lab work & pertinent test results  Airway Mallampati: II  TM Distance: >3 FB Neck ROM: full    Dental  (+) Teeth Intact, Dental Advidsory Given, Caps   Pulmonary  breath sounds clear to auscultation        Cardiovascular hypertension, Pt. on medications + angina with exertion + CAD and + CABG + dysrhythmias Atrial Fibrillation Rhythm:Irregular Rate:Abnormal  EF 60% on cath   Neuro/Psych  Headaches, negative neurological ROS     GI/Hepatic Neg liver ROS, hiatal hernia, PUD, GERD-  Medicated and Controlled,  Endo/Other  negative endocrine ROS  Renal/GU negative Renal ROS     Musculoskeletal negative musculoskeletal ROS (+)   Abdominal   Peds  Hematology negative hematology ROS (+)   Anesthesia Other Findings   Reproductive/Obstetrics                            Anesthesia Physical  Anesthesia Plan  ASA: IV  Anesthesia Plan: MAC   Post-op Pain Management:    Induction: Intravenous  Airway Management Planned: Natural Airway, Nasal Cannula and Mask  Additional Equipment:   Intra-op Plan:   Post-operative Plan:   Informed Consent: I have reviewed the patients History and Physical, chart, labs and discussed the procedure including the risks, benefits and alternatives for the proposed anesthesia with the patient or authorized representative who has indicated his/her understanding and acceptance.   Dental advisory given  Plan Discussed with: CRNA, Anesthesiologist and Surgeon  Anesthesia Plan Comments:        Anesthesia Quick Evaluation

## 2015-01-27 NOTE — Progress Notes (Signed)
Patient: Bradley [name] / Admit Date: 01/19/2015 / Date of Encounter: 01/27/2015, 8:50 AM   Subjective: Repeat BP 112/63 this AM. Says he's feeling somewhat better today than he has this week but still not at full energy. No CP or SOB. He laughs because he says today's the first day he's actually had an appetite yet he has to be NPO.  Objective: Telemetry: persistent atrial flutter - HR 80s-120s. HR currently 100. Physical Exam: Blood pressure 97/48, pulse 74, temperature 98.3 F (36.8 C), temperature source Oral, resp. rate 19, height 6\' 3"  (1.905 m), weight 225 lb 3.2 oz (102.15 kg), SpO2 95 %. General: Well developed, well nourished WM, in no acute distress. Head: Normocephalic, atraumatic, sclera non-icteric, no xanthomas, nares are without discharge. Neck: JVP not elevated. Lungs: Clear bilaterally to auscultation without wheezes, rales, or rhonchi. Breathing is unlabored. Heart: Irregularly irregular, rate controlled with S1 S2. No rubs gallops appreciated. Soft SEM at apex. Well healing sternal incision. Abdomen: Soft, non-tender, non-distended with normoactive bowel sounds. No rebound/guarding. Extremities: No clubbing or cyanosis. No edema. Distal pedal pulses are 2+ and equal bilaterally. Neuro: Alert and oriented X 3. Moves all extremities spontaneously. Psych:  Responds to questions appropriately with a normal affect.   Intake/Output Summary (Last 24 hours) at 01/27/15 0850 Last data filed at 01/26/15 2145  Gross per 24 hour  Intake      3 ml  Output    200 ml  Net   -197 ml    Inpatient Medications:  . amiodarone  400 mg Oral BID  . antiseptic oral rinse  7 mL Mouth Rinse BID  . aspirin EC  81 mg Oral Daily  . atorvastatin  20 mg Oral Daily  . docusate sodium  200 mg Oral Daily  . dutasteride  0.5 mg Oral QHS  . enoxaparin (LOVENOX) injection  1 mg/kg (Adjusted) Subcutaneous Q12H  . feeding supplement (ENSURE ENLIVE)  237 mL Oral TID BM  . hydrocortisone-pramoxine  1  applicator Rectal TID  . latanoprost  1 drop Both Eyes QHS  . metoprolol tartrate  25 mg Oral BID  . pantoprazole  40 mg Oral QAC breakfast  . polyethylene glycol  17 g Oral Daily  . sodium chloride  3 mL Intravenous Q12H  . timolol  1 drop Both Eyes Daily  . warfarin  5 mg Oral q1800  . Warfarin - Physician Dosing Inpatient   Does not apply q1800   Infusions:  . sodium chloride      Labs:  Recent Labs  01/25/15 0400 01/26/15 0352 01/27/15 0520  NA 140 137 138  K 3.5 3.5 3.8  CL 105 104 102  CO2 27 25 28   GLUCOSE 118* 112* 114*  BUN 20 18 21*  CREATININE 1.18 1.26* 1.48*  CALCIUM 8.7* 8.7* 8.7*  MG 2.1  --   --    No results for input(s): AST, ALT, ALKPHOS, BILITOT, PROT, ALBUMIN in the last 72 hours.  Recent Labs  01/25/15 0400 01/27/15 0520  WBC 9.8 11.3*  HGB 11.3* 10.7*  HCT 33.9* 32.3*  MCV 96.3 95.3  PLT 257 338   No results for input(s): CKTOTAL, CKMB, TROPONINI in the last 72 hours. Invalid input(s): POCBNP No results for input(s): HGBA1C in the last 72 hours.   Radiology/Studies:  Dg Chest 2 View  01/22/2015   CLINICAL DATA:  Status post CABG on January 19, 2015  EXAM: CHEST  2 VIEW  COMPARISON:  Portable chest x-ray of January 20, 2014.  FINDINGS: The left lung is mildly hyperinflated. There is left lower lobe atelectasis medially. There is trace of pleural fluid on the left. On the right there is a small pleural effusion and there is fluid in the minor fissure. The cardiac silhouette is mildly enlarged but has become less conspicuous since the previous study. The heart borders are sharper. The pulmonary vascularity is less engorged. There are 7 intact sternal wires. No acute bony abnormality is observed.  IMPRESSION: Small bilateral pleural effusions greater on the right than on the left. There is persistent left lower lobe atelectasis. Improved appearance of the pulmonary interstitium consistent with decreasing pulmonary interstitial edema.   Electronically Signed    By: David  Martinique M.D.   On: 01/22/2015 07:38   Dg Chest 2 View  01/15/2015   CLINICAL DATA:  Coronary artery disease, CABG planned  EXAM: CHEST  2 VIEW  COMPARISON:  None.  FINDINGS: The lungs are adequately inflated. There is minimal bibasilar subsegmental atelectasis or scarring. The heart and pulmonary vascularity are normal. The mediastinum is normal in width. There is mild tortuosity of the descending thoracic aorta. There is no pleural effusion or pneumothorax. There is mild multilevel degenerative disc disease of the thoracic spine.  IMPRESSION: COPD with probable bibasilar scarring. There is no CHF, pneumonia, nor other acute cardiopulmonary abnormality.   Electronically Signed   By: David  Martinique M.D.   On: 01/15/2015 10:09   Dg Chest Port 1 View  01/21/2015   CLINICAL DATA:  Status post CABG  EXAM: PORTABLE CHEST - 1 VIEW  COMPARISON:  Portable chest x-ray of January 20, 2015  FINDINGS: There has been interval removal of the left chest tube and mediastinal drain. No definite pneumothorax is observed. There is increased density in the retrocardiac region consistent with atelectasis. This is not greatly changed from yesterday's study. Traces of pleural fluid on the left is suspected. The right lung is adequately inflated. There is no focal infiltrate. The Swan-Ganz catheter has been removed. The right internal jugular Cordis sheath persists. The cardiac silhouette remains enlarged. The pulmonary vascularity is not engorged.  IMPRESSION: Persistent left lower lobe atelectasis and trace pleural effusion without evidence of a pneumothorax following chest tube and mediastinal drain removal. There is stable enlargement cardiac silhouette without significant pulmonary vascular congestion.   Electronically Signed   By: David  Martinique M.D.   On: 01/21/2015 07:46   Dg Chest Port 1 View  01/20/2015   CLINICAL DATA:  Coronary artery disease ; status post coronary artery bypass grafting  EXAM: PORTABLE CHEST - 1 VIEW   COMPARISON:  January 19, 2015  FINDINGS: Endotracheal tube and nasogastric tube have been removed. Swan-Ganz catheter tip is in the distal right main pulmonary artery, stable. There is a mediastinal drain and a left chest tube. There is no appreciable pneumothorax. There is mild left base atelectatic change. Lungs elsewhere clear. Heart is upper normal in size with pulmonary vascularity within normal limits. No adenopathy.  IMPRESSION: Tube and catheter positions as described without pneumothorax. Mild left base atelectasis. No edema or consolidation.   Electronically Signed   By: Lowella Grip III M.D.   On: 01/20/2015 07:19   Dg Chest Port 1 View  01/19/2015   CLINICAL DATA:  74 year old male status post CABG. Initial encounter.  EXAM: PORTABLE CHEST - 1 VIEW  COMPARISON:  Preoperative chest radiographs 01/15/2015  FINDINGS: Portable AP semi upright view at 1348 hrs.  Endotracheal tube tip at the level  the clavicles. Enteric tube courses to the left upper quadrant, tip not included. Left chest tube in place. Mediastinal drain in place. Right IJ approach Swan-Ganz catheter, tip at the proximal right lower lobe pulmonary artery level.  Sequelae of CABG. Lower lung volumes. No pneumothorax or acute pulmonary edema. Small left pleural effusion suspected. Other left lung base opacity favored due to atelectasis. Stable cardiac size and mediastinal contours.  IMPRESSION: 1. Lines and tubes appear appropriately placed. 2. No pneumothorax or edema. Left lung atelectasis and suspected small pleural effusion.   Electronically Signed   By: Genevie Ann M.D.   On: 01/19/2015 14:03     Assessment and Plan  1. 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 Post-op atrial fib/flutter - persistent atrial flutter 3. RBBB with h/o sinus bradycardia 4. H/o borderline HTN with softer BPs this admission 5. H/o GERD/GIB remotely without recurrence, maintained on PPI 6. Acute kidney inujry with rising Cr  2D echo reviewed - possible  inferobasal HK, EF 55%, mild LVH, mild MR, mildly dilated LA/RV/RA. Plan for TEE/DCCV this afternoon. Risks/benefits/alternatives discussed with the patient who wishes to proceed. He is on Lovenox/Coumadin. Wonder if rising Cr is a byproduct of tendency for BP to run softer with higher HR. Hopefully cardioversion should help. Will gently hydrate for 12 hours since he'll be NPO for a good part of the day.  Discussed BB with Dr. Harrington Challenger - given tendency for sinus rate to be slow, will hold further metoprolol this AM. May be able to be added back on depending on how BP and HR do post-DCCV.  Signed, Melina Copa PA-C Pager: [phone]   Patient seen and examined  Agree with assessment of D Dunn above  Plan for TEE cardioversion today.  Dorris Carnes

## 2015-01-27 NOTE — Progress Notes (Signed)
CARDIAC REHAB PHASE I   PRE:  Rate/Rhythm: 104-111 afib  BP:  Supine:   Sitting: 88/59  Standing:    SaO2: 97%RA  MODE:  Ambulation: 790 ft   POST:  Rate/Rhythm: up to 140 afib  105 with rest  BP:  Supine:   Sitting: 118/75  Standing:    SaO2: 99%RA 1884-1660 Pt walked 790 ft with asst x 1. Used rolling walker for last 240 ft. Encouraged pt to stand upright as he bends over during walk. Stated back a little sore. BP better with walk but heart rate to 140. Pt asked about CRP 2. Will send a letter of interest to [facility] with pt's permission. To recliner after walk.   Graylon Good, RN BSN  01/27/2015 9:43 AM

## 2015-01-28 ENCOUNTER — Inpatient Hospital Stay (HOSPITAL_COMMUNITY): Payer: Medicare Other

## 2015-01-28 LAB — BASIC METABOLIC PANEL
Anion gap: 9 (ref 5–15)
BUN: 18 mg/dL (ref 6–20)
CO2: 24 mmol/L (ref 22–32)
CREATININE: 1.3 mg/dL — AB (ref 0.61–1.24)
Calcium: 8.5 mg/dL — ABNORMAL LOW (ref 8.9–10.3)
Chloride: 104 mmol/L (ref 101–111)
GFR calc Af Amer: >60 mL/min (ref >60)
GFR, EST NON AFRICAN AMERICAN: 52 mL/min — AB (ref >60)
Glucose, Bld: 90 mg/dL (ref 65–99)
POTASSIUM: 4.1 mmol/L (ref 3.5–5.1)
SODIUM: 137 mmol/L (ref 135–145)

## 2015-01-28 LAB — PROTIME-INR
INR: 1.42 (ref 0.00–1.49)
Prothrombin Time: 17.5 seconds — ABNORMAL HIGH (ref 11.6–15.2)

## 2015-01-28 MED ORDER — IOHEXOL 300 MG/ML  SOLN
150.0000 mL | Freq: Once | INTRAMUSCULAR | Status: AC | PRN
  Administered 2015-01-28: 50 mL via ORAL

## 2015-01-28 NOTE — Progress Notes (Addendum)
MD Eula Fried paged/notified about pt fluctuating between SR/aflutter.  HR 60-90, with occasional drops in the 40s-50s.  Ordered to d/c cardizem gtt for now and restart if HR sustains >110.  Will continue to monitor patient closely.  Pt resting comfortably.  Claudette Stapler, RN

## 2015-01-28 NOTE — Progress Notes (Signed)
Pt assisted to ambulate 300 ft in hall using RW and verbal cues.  Tolerated well, though endorses "I feel tired now" afterward.  Gait steady, somewhat variable pace, needed reminding to hold head up.  To bed after walk with family present.  Will con't plan of care.

## 2015-01-28 NOTE — Progress Notes (Signed)
Transferred to 2W23 via [place] and monitor. Pt placed in bed. RN to receive in room.

## 2015-01-28 NOTE — Progress Notes (Signed)
Subjective: Feels better than he has in several days  Breathing is OK  No CP  Objective: Filed Vitals:   01/28/15 1000 01/28/15 1100 01/28/15 1200 01/28/15 1217  BP: 148/84 129/72 133/79   Pulse: 75 108 58   Temp:    98.2 F (36.8 C)  TempSrc:    Oral  Resp: 22 16 16    Height:      Weight:      SpO2: 98% 93% 99%    Weight change: -5.3 oz (-0.15 kg)  Intake/Output Summary (Last 24 hours) at 01/28/15 1307 Last data filed at 01/28/15 1200  Gross per 24 hour  Intake 1032.5 ml  Output    800 ml  Net  232.5 ml    General: Alert, awake, oriented x3, in no acute distress Neck:  JVP is normal Heart: Irregular rate and rhythm, without murmurs, rubs, gallops.  Lungs: Clear to auscultation.  No rales or wheezes. Exemities:  No edema.   Neuro: Grossly intact, nonfocal.  Tele  Afib  80s   Lab Results: Results for orders placed or performed during the hospital encounter of 01/19/15 (from the past 24 hour(s))  Protime-INR     Status: Abnormal   Collection Time: 01/28/15  2:46 AM  Result Value Ref Range   Prothrombin Time 17.5 (H) 11.6 - 15.2 seconds   INR 1.42 0.00 - 9.70  Basic metabolic panel     Status: Abnormal   Collection Time: 01/28/15  2:46 AM  Result Value Ref Range   Sodium 137 135 - 145 mmol/L   Potassium 4.1 3.5 - 5.1 mmol/L   Chloride 104 101 - 111 mmol/L   CO2 24 22 - 32 mmol/L   Glucose, Bld 90 65 - 99 mg/dL   BUN 18 6 - 20 mg/dL   Creatinine, Ser 1.30 (H) 0.61 - 1.24 mg/dL   Calcium 8.5 (L) 8.9 - 10.3 mg/dL   GFR calc non Af Amer 52 (L) >60 mL/min   GFR calc Af Amer >60 >60 mL/min   Anion gap 9 5 - 15    Studies/Results: Dg Esophagus W/water Sol Cm  01/28/2015   CLINICAL DATA:  Difficulty passing endoscope yesterday. Hypotension during endoscopy recovery.  EXAM: ESOPHOGRAM/BARIUM SWALLOW  TECHNIQUE: Single contrast examination was performed using initially water-soluble contrast, thin thin barium.  FLUOROSCOPY TIME:  Radiation Exposure Index (as provided  by the fluoroscopic device): 351 microGy*m^2  COMPARISON:  None.  FINDINGS: Multiple initial series observation of swallowing with Omnipaque 300 contrast medium in the LPO position revealed no evidence of esophageal leak. When it was clear that there was no gross leak, I elected to administer thin barium in order to provide better coating and improved sensitivity for small leak. In the LPO position, no leak was observed. The patient has had prior hiatal hernia repair. Primary peristaltic waves were intact.  The patient also mentioned a globus sensation with swallowing. Upon frontal and lateral imaging of the neck during swallowing, there is mild narrowing of the cervical esophagus (for example, images 41-42 of series 6) which may partially explain difficulty with intubation. This is over a fairly long segment, and not irregular. There is no leak in the cervical esophageal region identified.  IMPRESSION: 1. Generally mildly narrowed/stenotic cervical esophagus may explain the difficulty with intubation. No leak identified.   Electronically Signed   By: Van Clines M.D.   On: 01/28/2015 09:34    Medications:Reviewed     @PROBHOSP @  1  Afib  Rates  better than the past few days  Would continue low dose dilt (po when swallow study back) as amiodarone continues to load. Continue anticoagulation with lovenox / coumadin until INR greater than 2.    2  CAD  S/p CABG  3.  HL  Continue on lipitor.   LOS: 9 days   Dorris Carnes 01/28/2015, 1:07 PM

## 2015-01-28 NOTE — Addendum Note (Signed)
Addendum  created 01/28/15 0826 by Susa Loffler, CRNA   Modules edited: Anesthesia Medication Administration

## 2015-01-28 NOTE — Progress Notes (Signed)
Report to 2 W RN 

## 2015-01-28 NOTE — Care Management Important Message (Signed)
Important Message  Patient Details  Name: Bradley [name] MRN: [id number] Date of Birth: 03-31-1941   Medicare Important Message Given:  Yes-fourth notification given    Pricilla Handler 01/28/2015, 2:02 PM

## 2015-01-28 NOTE — Progress Notes (Addendum)
[facility] DAILY ICU PROGRESS NOTE                   [address]            [place] [address]          [phone]   1 Day Post-Op Procedure(s): CANCELLED PROCEDURE  Total Length of Stay:  LOS: 9 days   Subjective: Stable night, "I actually got a little sleep and I feel better today."  Breathing stable, throat less sore.   Objective: Vital signs in last 24 hours: Temp:  [97.6 F (36.4 C)-98.4 F (36.9 C)] 98.4 F (36.9 C) (07/14 0006) Pulse Rate:  [38-139] 85 (07/14 0700) Cardiac Rhythm:  [-] Normal sinus rhythm (07/14 0400) Resp:  [14-23] 18 (07/14 0700) BP: (71-159)/(32-102) 149/81 mmHg (07/14 0700) SpO2:  [93 %-100 %] 96 % (07/14 0700) Weight:  [224 lb 13.9 oz (102 kg)] 224 lb 13.9 oz (102 kg) (07/14 0500)  Filed Weights   01/26/15 0418 01/27/15 0438 01/28/15 0500  Weight: 224 lb 10.4 oz (101.9 kg) 225 lb 3.2 oz (102.15 kg) 224 lb 13.9 oz (102 kg)    Weight change: -5.3 oz (-0.15 kg)   Hemodynamic parameters for last 24 hours:    Intake/Output from previous day: 07/13 0701 - 07/14 0700 In: 1007.5 [I.V.:1007.5] Out: 600 [Urine:600]  Intake/Output this shift:    Current Meds: Scheduled Meds: . amiodarone  400 mg Oral BID  . antiseptic oral rinse  7 mL Mouth Rinse BID  . aspirin EC  81 mg Oral Daily  . atorvastatin  20 mg Oral Daily  . docusate sodium  200 mg Oral Daily  . dutasteride  0.5 mg Oral QHS  . enoxaparin (LOVENOX) injection  1 mg/kg (Adjusted) Subcutaneous Q12H  . feeding supplement (ENSURE ENLIVE)  237 mL Oral TID BM  . hydrocortisone-pramoxine  1 applicator Rectal TID  . latanoprost  1 drop Both Eyes QHS  . pantoprazole  40 mg Oral QAC breakfast  . polyethylene glycol  17 g Oral Daily  . sodium chloride  3 mL Intravenous Q12H  . timolol  1 drop Both Eyes Daily  . warfarin  5 mg Oral q1800  . Warfarin - Physician Dosing Inpatient   Does not apply q1800   Continuous Infusions: . diltiazem (CARDIZEM) infusion Stopped (01/28/15  0500)   PRN Meds:.sodium chloride, bisacodyl **OR** bisacodyl, hydrocortisone cream, lactulose, ondansetron, promethazine, sodium chloride, traMADol   Physical Exam: General appearance: alert, cooperative and no distress Heart: irregularly irregular rhythm, rates 70-80s Lungs: clear to auscultation bilaterally Extremities: no edema, redness or tenderness in the calves or thighs Wound: Clean and dry  Lab Results: CBC: Recent Labs  01/27/15 0520  WBC 11.3*  HGB 10.7*  HCT 32.3*  PLT 338   BMET:  Recent Labs  01/27/15 0520 01/28/15 0246  NA 138 137  K 3.8 4.1  CL 102 104  CO2 28 24  GLUCOSE 114* 90  BUN 21* 18  CREATININE 1.48* 1.30*  CALCIUM 8.7* 8.5*    PT/INR:  Recent Labs  01/28/15 0246  LABPROT 17.5*  INR 1.42   Radiology: No results found.   Assessment/Plan: S/P Procedure(s): CANCELLED PROCEDURE  CV- AF, rate controlled this am on Cardizem gtt after aborted DCCV yesterday.  Continue for now, will convert meds to po pending swallow study.  For swallow study this am to r/o esophageal perforation. If swallow ok, will resume pos and advance diet as tolerated.  Renal-  Cr trending down. Continue to monitor.  Tx stepdown.     COLLINS,GINA H 01/28/2015 7:42 AM  Rate controlled on Cardizem drip, still waiting for swallow , ordered last pm.   I have seen and examined Bradley [name] and agree with the above assessment  and plan.  Grace Isaac MD Beeper [phone] Office [phone] 01/28/2015 8:09 AM

## 2015-01-29 ENCOUNTER — Other Ambulatory Visit: Payer: Self-pay | Admitting: Physician Assistant

## 2015-01-29 DIAGNOSIS — I480 Paroxysmal atrial fibrillation: Secondary | ICD-10-CM

## 2015-01-29 LAB — BASIC METABOLIC PANEL
ANION GAP: 6 (ref 5–15)
BUN: 19 mg/dL (ref 6–20)
CO2: 26 mmol/L (ref 22–32)
Calcium: 8.7 mg/dL — ABNORMAL LOW (ref 8.9–10.3)
Chloride: 104 mmol/L (ref 101–111)
Creatinine, Ser: 1.22 mg/dL (ref 0.61–1.24)
GFR calc Af Amer: >60 mL/min (ref >60)
GFR, EST NON AFRICAN AMERICAN: 56 mL/min — AB (ref >60)
Glucose, Bld: 118 mg/dL — ABNORMAL HIGH (ref 65–99)
Potassium: 3.9 mmol/L (ref 3.5–5.1)
Sodium: 136 mmol/L (ref 135–145)

## 2015-01-29 LAB — PROTIME-INR
INR: 1.45 (ref 0.00–1.49)
Prothrombin Time: 17.7 seconds — ABNORMAL HIGH (ref 11.6–15.2)

## 2015-01-29 MED ORDER — POTASSIUM CHLORIDE CRYS ER 20 MEQ PO TBCR
20.0000 meq | EXTENDED_RELEASE_TABLET | Freq: Once | ORAL | Status: AC
  Administered 2015-01-29: 20 meq via ORAL
  Filled 2015-01-29: qty 1

## 2015-01-29 NOTE — Progress Notes (Signed)
Patient Name: Bradley [name] Date of Encounter: 01/29/2015  Primary Cardiologist: Dr. Burt Knack   Principal Problem:   S/P CABG x 4 Active Problems:   Benign essential HTN   CA in situ prostate    SUBJECTIVE  Urine has some fresh blood yesterday, was passing blood clot last night. This morning was clearing up. He also had episode of presyncope yesterday along with significant diaphoresis, was not sure if has any significant bradycardia at the time. Denies any CP during the episodes  CURRENT MEDS . amiodarone  400 mg Oral BID  . antiseptic oral rinse  7 mL Mouth Rinse BID  . aspirin EC  81 mg Oral Daily  . atorvastatin  20 mg Oral Daily  . docusate sodium  200 mg Oral Daily  . dutasteride  0.5 mg Oral QHS  . enoxaparin (LOVENOX) injection  1 mg/kg (Adjusted) Subcutaneous Q12H  . feeding supplement (ENSURE ENLIVE)  237 mL Oral TID BM  . hydrocortisone-pramoxine  1 applicator Rectal TID  . latanoprost  1 drop Both Eyes QHS  . pantoprazole  40 mg Oral QAC breakfast  . polyethylene glycol  17 g Oral Daily  . sodium chloride  3 mL Intravenous Q12H  . timolol  1 drop Both Eyes Daily  . warfarin  5 mg Oral q1800  . Warfarin - Physician Dosing Inpatient   Does not apply q1800    OBJECTIVE  Filed Vitals:   01/28/15 1400 01/28/15 1434 01/28/15 2017 01/29/15 0447  BP: 128/58 128/64 109/55 131/53  Pulse: 87 81 64 80  Temp:   98.3 F (36.8 C) 97.7 F (36.5 C)  TempSrc:   Oral Oral  Resp: 17 20 19 21   Height:      Weight:    225 lb 11.2 oz (102.377 kg)  SpO2: 93% 97% 94% 95%    Intake/Output Summary (Last 24 hours) at 01/29/15 0909 Last data filed at 01/28/15 1700  Gross per 24 hour  Intake    515 ml  Output    201 ml  Net    314 ml   Filed Weights   01/27/15 0438 01/28/15 0500 01/29/15 0447  Weight: 225 lb 3.2 oz (102.15 kg) 224 lb 13.9 oz (102 kg) 225 lb 11.2 oz (102.377 kg)    PHYSICAL EXAM  General: Pleasant, NAD. Neuro: Alert and oriented X 3. Moves all  extremities spontaneously. Psych: Normal affect. HEENT:  Normal  Neck: Supple without bruits or JVD. Lungs:  Resp regular and unlabored. Decreased breath sound in bilateral bases, no obvious rale.  Heart: RRR no s3, s4, or murmurs. Abdomen: Soft, non-tender, non-distended, BS + x 4.  Extremities: No clubbing, cyanosis. DP/PT/Radials 2+ and equal bilaterally. 1+ RLE pitting edema, trace LLE edema  Accessory Clinical Findings  CBC  Recent Labs  01/27/15 0520  WBC 11.3*  HGB 10.7*  HCT 32.3*  MCV 95.3  PLT 237   Basic Metabolic Panel  Recent Labs  01/28/15 0246 01/29/15 0442  NA 137 136  K 4.1 3.9  CL 104 104  CO2 24 26  GLUCOSE 90 118*  BUN 18 19  CREATININE 1.30* 1.22  CALCIUM 8.5* 8.7*    TELE Converted from afib to aflutter then to NSR around 12:43am this morning.     ECG  NSR with RBBB  Echocardiogram 01/26/2015  LV EF: 55%  ------------------------------------------------------------------- Indications:   Atrial flutter 427.32.  ------------------------------------------------------------------- History:  PMH: Status post CABG. Meningioma. Reflux. Right bundle branch block. Coronary artery  disease. Risk factors: Hypertension. Dyslipidemia.  ------------------------------------------------------------------- Study Conclusions  - Left ventricle: Possible inferobasal hypokinesis. The cavity size was normal. Wall thickness was increased in a pattern of mild LVH. The estimated ejection fraction was 55%. - Mitral valve: There was mild regurgitation. - Left atrium: The atrium was mildly dilated. - Right ventricle: The cavity size was mildly dilated. - Right atrium: The atrium was mildly dilated. - Atrial septum: No defect or patent foramen ovale was identified.     Radiology/Studies  Dg Chest 2 View  01/22/2015   CLINICAL DATA:  Status post CABG on January 19, 2015  EXAM: CHEST  2 VIEW  COMPARISON:  Portable chest x-ray of January 20, 2014.   FINDINGS: The left lung is mildly hyperinflated. There is left lower lobe atelectasis medially. There is trace of pleural fluid on the left. On the right there is a small pleural effusion and there is fluid in the minor fissure. The cardiac silhouette is mildly enlarged but has become less conspicuous since the previous study. The heart borders are sharper. The pulmonary vascularity is less engorged. There are 7 intact sternal wires. No acute bony abnormality is observed.  IMPRESSION: Small bilateral pleural effusions greater on the right than on the left. There is persistent left lower lobe atelectasis. Improved appearance of the pulmonary interstitium consistent with decreasing pulmonary interstitial edema.   Electronically Signed   By: David  Martinique M.D.   On: 01/22/2015 07:38   Dg Chest 2 View  01/15/2015   CLINICAL DATA:  Coronary artery disease, CABG planned  EXAM: CHEST  2 VIEW  COMPARISON:  None.  FINDINGS: The lungs are adequately inflated. There is minimal bibasilar subsegmental atelectasis or scarring. The heart and pulmonary vascularity are normal. The mediastinum is normal in width. There is mild tortuosity of the descending thoracic aorta. There is no pleural effusion or pneumothorax. There is mild multilevel degenerative disc disease of the thoracic spine.  IMPRESSION: COPD with probable bibasilar scarring. There is no CHF, pneumonia, nor other acute cardiopulmonary abnormality.   Electronically Signed   By: David  Martinique M.D.   On: 01/15/2015 10:09   Dg Chest Port 1 View  01/21/2015   CLINICAL DATA:  Status post CABG  EXAM: PORTABLE CHEST - 1 VIEW  COMPARISON:  Portable chest x-ray of January 20, 2015  FINDINGS: There has been interval removal of the left chest tube and mediastinal drain. No definite pneumothorax is observed. There is increased density in the retrocardiac region consistent with atelectasis. This is not greatly changed from yesterday's study. Traces of pleural fluid on the left is  suspected. The right lung is adequately inflated. There is no focal infiltrate. The Swan-Ganz catheter has been removed. The right internal jugular Cordis sheath persists. The cardiac silhouette remains enlarged. The pulmonary vascularity is not engorged.  IMPRESSION: Persistent left lower lobe atelectasis and trace pleural effusion without evidence of a pneumothorax following chest tube and mediastinal drain removal. There is stable enlargement cardiac silhouette without significant pulmonary vascular congestion.   Electronically Signed   By: David  Martinique M.D.   On: 01/21/2015 07:46   Dg Chest Port 1 View  01/20/2015   CLINICAL DATA:  Coronary artery disease ; status post coronary artery bypass grafting  EXAM: PORTABLE CHEST - 1 VIEW  COMPARISON:  January 19, 2015  FINDINGS: Endotracheal tube and nasogastric tube have been removed. Swan-Ganz catheter tip is in the distal right main pulmonary artery, stable. There is a mediastinal drain and a left  chest tube. There is no appreciable pneumothorax. There is mild left base atelectatic change. Lungs elsewhere clear. Heart is upper normal in size with pulmonary vascularity within normal limits. No adenopathy.  IMPRESSION: Tube and catheter positions as described without pneumothorax. Mild left base atelectasis. No edema or consolidation.   Electronically Signed   By: Lowella Grip III M.D.   On: 01/20/2015 07:19   Dg Chest Port 1 View  01/19/2015   CLINICAL DATA:  74 year old male status post CABG. Initial encounter.  EXAM: PORTABLE CHEST - 1 VIEW  COMPARISON:  Preoperative chest radiographs 01/15/2015  FINDINGS: Portable AP semi upright view at 1348 hrs.  Endotracheal tube tip at the level the clavicles. Enteric tube courses to the left upper quadrant, tip not included. Left chest tube in place. Mediastinal drain in place. Right IJ approach Swan-Ganz catheter, tip at the proximal right lower lobe pulmonary artery level.  Sequelae of CABG. Lower lung volumes. No  pneumothorax or acute pulmonary edema. Small left pleural effusion suspected. Other left lung base opacity favored due to atelectasis. Stable cardiac size and mediastinal contours.  IMPRESSION: 1. Lines and tubes appear appropriately placed. 2. No pneumothorax or edema. Left lung atelectasis and suspected small pleural effusion.   Electronically Signed   By: Genevie Ann M.D.   On: 01/19/2015 14:03   Dg Esophagus W/water Sol Cm  01/28/2015   CLINICAL DATA:  Difficulty passing endoscope yesterday. Hypotension during endoscopy recovery.  EXAM: ESOPHOGRAM/BARIUM SWALLOW  TECHNIQUE: Single contrast examination was performed using initially water-soluble contrast, thin thin barium.  FLUOROSCOPY TIME:  Radiation Exposure Index (as provided by the fluoroscopic device): 351 microGy*m^2  COMPARISON:  None.  FINDINGS: Multiple initial series observation of swallowing with Omnipaque 300 contrast medium in the LPO position revealed no evidence of esophageal leak. When it was clear that there was no gross leak, I elected to administer thin barium in order to provide better coating and improved sensitivity for small leak. In the LPO position, no leak was observed. The patient has had prior hiatal hernia repair. Primary peristaltic waves were intact.  The patient also mentioned a globus sensation with swallowing. Upon frontal and lateral imaging of the neck during swallowing, there is mild narrowing of the cervical esophagus (for example, images 41-42 of series 6) which may partially explain difficulty with intubation. This is over a fairly long segment, and not irregular. There is no leak in the cervical esophageal region identified.  IMPRESSION: 1. Generally mildly narrowed/stenotic cervical esophagus may explain the difficulty with intubation. No leak identified.   Electronically Signed   By: Van Clines M.D.   On: 01/28/2015 09:34    ASSESSMENT AND PLAN  1. Postop proxysmal atrial fibrillation 01/23/2015  - echo  01/26/2015 EF 55%, mild MR  - unable to pass TEE probe on 7/13, TEE/DCCB aborted  - day #5 of loading with 400mg  BID of amiodarone, expect to change to 200mg  BID on 7/19 (after 7 days of loading)  - continue coumadin bridge with lovenox, INR 1.45 today  - converted to SR, IV diltiazem stopped because of bradycardia despite on only 5 of IV dilt at the time. H/o bradycardia, I would keep on amiodarone  Decreased to 400 qd at discharge Continue coumadin for INR of 2   2. CAD s/p 4v CABG (LIMA-LAD, SVG-OM, SVG-Int, SVG-PD) on 7/5 (this admission)  - does have 1+ pitting edema in RLE, but only trace in LLE, need to monitor sign of HF  3. AKI: Cr  improving 4. RBBB with h/o sinus bradycardia  Follow on Amiodarone. 5. HTN 6. HLD 7. H/o GIB remotely without recurrence, on PPI 8. Hematuria: had some hematuria yesterday, stopped, urine from last night has blood clot and appears dark. Pt is followed by urology  WIll need to see post d/c   Optimally should be on coumadin for 4 wks post conversion from afib.     Hilbert Corrigan PA-C Pager: [phone] Patient seen and examined  Agree with findings as noted above by H Meng Pt is back in SR  Would switch to amiodarone qd on discharge Note that he is on 40 KCL now  No lasix  K has been in 4 range.  WIll need to be followed.  Will make sure that patient hs f/u for INR early next week. In clinic .  Dorris Carnes

## 2015-01-29 NOTE — Progress Notes (Signed)
Pt ambulating hall with family. 

## 2015-01-29 NOTE — Progress Notes (Signed)
CARDIAC REHAB PHASE I   PRE:  Rate/Rhythm: 76 SR    BP: sitting 147/84    SaO2: 92 RA  MODE:  Ambulation: 550 ft   POST:  Rate/Rhythm: 91 SR    BP: sitting 158/79     SaO2: 98 RA  Pt moving well. Steady without RW. Rest x1 due to SOB but overall did well. Feeling good.  [id number]   Josephina Shih [place] CES, ACSM 01/29/2015 12:12 PM

## 2015-01-29 NOTE — Progress Notes (Signed)
Pt currently in sinus rhythm, EKG done to confirm and loaded to EPIC.  MD Eula Fried paged/notified.  No new orders at this time.  Will continue to monitor pt closely. Claudette Stapler, RN

## 2015-01-29 NOTE — Progress Notes (Addendum)
      [address]       [place] [address]             [phone]        2 Days Post-Op Procedure(s): CANCELLED PROCEDURE  Subjective: Patient ate breakfast without problems. He hopes to go home in am.  Objective: Vital signs in last 24 hours: Temp:  [97.7 F (36.5 C)-98.3 F (36.8 C)] 97.7 F (36.5 C) (07/15 0447) Pulse Rate:  [58-108] 80 (07/15 0447) Cardiac Rhythm:  [-] Normal sinus rhythm (07/15 0846) Resp:  [16-22] 21 (07/15 0447) BP: (106-148)/(35-84) 131/53 mmHg (07/15 0447) SpO2:  [91 %-99 %] 95 % (07/15 0447) Weight:  [225 lb 11.2 oz (102.377 kg)] 225 lb 11.2 oz (102.377 kg) (07/15 0447)  Pre op weight 102.5 kg Current Weight  01/29/15 225 lb 11.2 oz (102.377 kg)      Intake/Output from previous day: 07/14 0701 - 07/15 0700 In: 525 [P.O.:480; I.V.:45] Out: 201 [Urine:200; Stool:1]   Physical Exam:  Cardiovascular: RRR Pulmonary: Clear to auscultation bilaterally; no rales, wheezes, or rhonchi. Abdomen: Soft, non tender, bowel sounds present. Extremities: Trace bilateral lower extremity edema. Wounds: Clean and dry.  No erythema or signs of infection.  Lab Results: CBC: Recent Labs  01/27/15 0520  WBC 11.3*  HGB 10.7*  HCT 32.3*  PLT 338   BMET:  Recent Labs  01/28/15 0246 01/29/15 0442  NA 137 136  K 4.1 3.9  CL 104 104  CO2 24 26  GLUCOSE 90 118*  BUN 18 19  CREATININE 1.30* 1.22  CALCIUM 8.5* 8.7*    PT/INR:  Lab Results  Component Value Date   INR 1.45 01/29/2015   INR 1.42 01/28/2015   INR 1.31 01/27/2015   ABG:  INR: Will add last result for INR, ABG once components are confirmed Will add last 4 CBG results once components are confirmed  Assessment/Plan:  1. CV - A fib/fl. SR in the 80's this am.On Amiodarone 400 mg bid and Coumadin. Cardizem drip stopped last night because of bradycardia.INR slightly increased from 1.42 to 1.45. 2.  Pulmonary - Encourage incentive spirometer 3.GI- swallow study  yesterday showed mildly narrowed/stenotic cervical esophagus but NO leak. Tolerating a heart healthy diet. 4.  Acute blood loss anemia - Last H and H 10.7 and 32.3 5. Supplement potassium 6. Creatinine down to 1.22 7. Hopefully, home in am  ZIMMERMAN,DONIELLE MPA-C 01/29/2015,9:09 AM  Feels better today Holding sinus today I have seen and examined Bradley [name] and agree with the above assessment  and plan.  Grace Isaac MD Beeper [phone] Office [phone] 01/29/2015 5:36 PM

## 2015-01-30 DIAGNOSIS — I4891 Unspecified atrial fibrillation: Secondary | ICD-10-CM

## 2015-01-30 LAB — PROTIME-INR
INR: 1.78 — ABNORMAL HIGH (ref 0.00–1.49)
Prothrombin Time: 20.7 seconds — ABNORMAL HIGH (ref 11.6–15.2)

## 2015-01-30 LAB — CBC
HCT: 30.8 % — ABNORMAL LOW (ref 39.0–52.0)
Hemoglobin: 10.1 g/dL — ABNORMAL LOW (ref 13.0–17.0)
MCH: 31.3 pg (ref 26.0–34.0)
MCHC: 32.8 g/dL (ref 30.0–36.0)
MCV: 95.4 fL (ref 78.0–100.0)
Platelets: 377 10*3/uL (ref 150–400)
RBC: 3.23 MIL/uL — ABNORMAL LOW (ref 4.22–5.81)
RDW: 14.1 % (ref 11.5–15.5)
WBC: 10.5 10*3/uL (ref 4.0–10.5)

## 2015-01-30 MED ORDER — AMIODARONE HCL 200 MG PO TABS: 400.0000 mg | ORAL_TABLET | Freq: Two times a day (BID) | ORAL | Status: DC

## 2015-01-30 MED ORDER — TRAMADOL HCL 50 MG PO TABS: 50.0000 mg | ORAL_TABLET | ORAL | Status: DC | PRN

## 2015-01-30 MED ORDER — WARFARIN SODIUM 5 MG PO TABS: 5.0000 mg | ORAL_TABLET | Freq: Every day | ORAL | Status: DC

## 2015-01-30 MED ORDER — POLYETHYLENE GLYCOL 3350 17 G PO PACK: 17.0000 g | PACK | Freq: Every day | ORAL | Status: DC

## 2015-01-30 NOTE — Progress Notes (Signed)
      [address]       [place] [address]             [phone]      3 Days Post-Op Procedure(s): CANCELLED PROCEDURE   Subjective:  Bradley [name] has no new complaints this morning.  He states he is ready to go home.   Objective: Vital signs in last 24 hours: Temp:  [97.5 F (36.4 C)-98.5 F (36.9 C)] 98.4 F (36.9 C) (07/16 0416) Pulse Rate:  [69-78] 72 (07/16 0416) Cardiac Rhythm:  [-] Normal sinus rhythm (07/16 0855) Resp:  [18-21] 21 (07/16 0416) BP: (110-131)/(52-74) 110/52 mmHg (07/16 0416) SpO2:  [94 %-100 %] 94 % (07/16 0416) Weight:  [226 lb 8 oz (102.74 kg)] 226 lb 8 oz (102.74 kg) (07/16 0416)  Intake/Output from previous day: 07/15 0701 - 07/16 0700 In: 720 [P.O.:720] Out: 500 [Urine:500]  General appearance: alert, cooperative and no distress Heart: regular rate and rhythm Lungs: clear to auscultation bilaterally Abdomen: soft, non-tender; bowel sounds normal; no masses,  no organomegaly Extremities: edema trace Wound: clean and dry  Lab Results:  Recent Labs  01/30/15 0452  WBC 10.5  HGB 10.1*  HCT 30.8*  PLT 377   BMET:  Recent Labs  01/28/15 0246 01/29/15 0442  NA 137 136  K 4.1 3.9  CL 104 104  CO2 24 26  GLUCOSE 90 118*  BUN 18 19  CREATININE 1.30* 1.22  CALCIUM 8.5* 8.7*    PT/INR:  Recent Labs  01/30/15 0452  LABPROT 20.7*  INR 1.78*   ABG    Component Value Date/Time   PHART 7.327* 01/19/2015 1849   HCO3 23.2 01/19/2015 1849   TCO2 20 01/20/2015 1607   ACIDBASEDEF 3.0* 01/19/2015 1849   O2SAT 94.0 01/19/2015 1849   CBG (last 3)  No results for input(s): GLUCAP in the last 72 hours.  Assessment/Plan: S/P Procedure(s): CANCELLED PROCEDURE  1. CV- maintaining NSR this morning- continue Amiodarone 2. INR 1.78, continue Coumadin  3. Pulm- no acute issues, continue IS 4. Renal- creatinine has been stable, mild edema, weight is stable, will give a few days of Lasix 5. GU- continues to have  episodes of hematuria, this should resolve over time, patient to follow up with outpatient Urology 6. DIspo- patient stable, will d/c home today    LOS: 11 days    Joci Dress 01/30/2015

## 2015-01-30 NOTE — Progress Notes (Signed)
Completed education at the bedside in anticipation of discharge today.  Education completed with pt, wife and son.  Reviewed sternal precautions, exercise guidelines and restrictions, heart healthy diet, signs and symptoms of infection and when to call the MD.  Handouts provided, questions answered. Cherre Huger, BSN [phone]

## 2015-01-30 NOTE — Progress Notes (Signed)
Discharged to home with family office visits in place teaching done  

## 2015-01-30 NOTE — Discharge Instructions (Signed)
Information on my medicine - Coumadin   (Warfarin)  This medication education was reviewed with me or my healthcare representative as part of my discharge preparation.  The pharmacist that spoke with me during my hospital stay was:  Georgina Peer, [facility]  Why was Coumadin prescribed for you? Coumadin was prescribed for you because you have a blood clot or a medical condition that can cause an increased risk of forming blood clots. Blood clots can cause serious health problems by blocking the flow of blood to the heart, lung, or brain. Coumadin can prevent harmful blood clots from forming. As a reminder your indication for Coumadin is:   Stroke Prevention Because Of Atrial Fibrillation   What test will check on my response to Coumadin? While on Coumadin (warfarin) you will need to have an INR test regularly to ensure that your dose is keeping you in the desired range. The INR (international normalized ratio) number is calculated from the result of the laboratory test called prothrombin time (PT).  If an INR APPOINTMENT HAS NOT ALREADY BEEN MADE FOR YOU please schedule an appointment to have this lab work done by your health care provider within 7 days. Your INR goal is usually a number between:  2 to 3 or your provider may give you a more narrow range like 2-2.5.  Ask your health care provider during an office visit what your goal INR is.  What  do you need to  know  About  COUMADIN? Take Coumadin (warfarin) exactly as prescribed by your healthcare provider about the same time each day.  DO NOT stop taking without talking to the doctor who prescribed the medication.  Stopping without other blood clot prevention medication to take the place of Coumadin may increase your risk of developing a new clot or stroke.  Get refills before you run out.  What do you do if you miss a dose? If you miss a dose, take it as soon as you remember on the same day then continue your regularly scheduled regimen the  next day.  Do not take two doses of Coumadin at the same time.  Important Safety Information A possible side effect of Coumadin (Warfarin) is an increased risk of bleeding. You should call your healthcare provider right away if you experience any of the following: ? Bleeding from an injury or your nose that does not stop. ? Unusual colored urine (red or dark brown) or unusual colored stools (red or black). ? Unusual bruising for unknown reasons. ? A serious fall or if you hit your head (even if there is no bleeding).  Some foods or medicines interact with Coumadin (warfarin) and might alter your response to warfarin. To help avoid this: ? Eat a balanced diet, maintaining a consistent amount of Vitamin K. ? Notify your provider about major diet changes you plan to make. ? Avoid alcohol or limit your intake to 1 drink for women and 2 drinks for men per day. (1 drink is 5 oz. wine, 12 oz. beer, or 1.5 oz. liquor.)  Make sure that ANY health care provider who prescribes medication for you knows that you are taking Coumadin (warfarin).  Also make sure the healthcare provider who is monitoring your Coumadin knows when you have started a new medication including herbals and non-prescription products.  Coumadin (Warfarin)  Major Drug Interactions  Increased Warfarin Effect Decreased Warfarin Effect  Alcohol (large quantities) Antibiotics (esp. Septra/Bactrim, Flagyl, Cipro) Amiodarone (Cordarone) Aspirin (ASA) Cimetidine (Tagamet) Megestrol (Megace) NSAIDs (  ibuprofen, naproxen, etc.) Piroxicam (Feldene) Propafenone (Rythmol SR) Propranolol (Inderal) Isoniazid (INH) Posaconazole (Noxafil) Barbiturates (Phenobarbital) Carbamazepine (Tegretol) Chlordiazepoxide (Librium) Cholestyramine (Questran) Griseofulvin Oral Contraceptives Rifampin Sucralfate (Carafate) Vitamin K   Coumadin (Warfarin) Major Herbal Interactions  Increased Warfarin Effect Decreased Warfarin Effect   Garlic Ginseng Ginkgo biloba Coenzyme Q10 Green tea St. Johns wort    Coumadin (Warfarin) FOOD Interactions  Eat a consistent number of servings per week of foods HIGH in Vitamin K (1 serving =  cup)  Collards (cooked, or boiled & drained) Kale (cooked, or boiled & drained) Mustard greens (cooked, or boiled & drained) Parsley *serving size only =  cup Spinach (cooked, or boiled & drained) Swiss chard (cooked, or boiled & drained) Turnip greens (cooked, or boiled & drained)  Eat a consistent number of servings per week of foods MEDIUM-HIGH in Vitamin K (1 serving = 1 cup)  Asparagus (cooked, or boiled & drained) Broccoli (cooked, boiled & drained, or raw & chopped) Brussel sprouts (cooked, or boiled & drained) *serving size only =  cup Lettuce, raw (green leaf, endive, romaine) Spinach, raw Turnip greens, raw & chopped   These websites have more information on Coumadin (warfarin):  FailFactory.se; VeganReport.com.au;   Coronary Artery Bypass Grafting, Care After Refer to this sheet in the next few weeks. These instructions provide you with information on caring for yourself after your procedure. Your health care provider may also give you more specific instructions. Your treatment has been planned according to current medical practices, but problems sometimes occur. Call your health care provider if you have any problems or questions after your procedure. WHAT TO EXPECT AFTER THE PROCEDURE Recovery from surgery will be different for everyone. Some people feel well after 3 or 4 weeks, while for others it takes longer. After your procedure, it is typical to have the following:  Nausea and a lack of appetite.   Constipation.  Weakness and fatigue.   Depression or irritability.   Pain or discomfort at your incision site. HOME CARE INSTRUCTIONS  Take medicines only as directed by your health care provider. Do not stop taking medicines or start any  new medicines without first checking with your health care provider.  Take your pulse as directed by your health care provider.  Perform deep breathing as directed by your health care provider. If you were given a device called an incentive spirometer, use it to practice deep breathing several times a day. Support your chest with a pillow or your arms when you take deep breaths or cough.  Keep incision areas clean, dry, and protected. Remove or change any bandages (dressings) only as directed by your health care provider. You may have skin adhesive strips over the incision areas. Do not take the strips off. They will fall off on their own.  Check incision areas daily for any swelling, redness, or drainage.  If incisions were made in your legs, do the following:  Avoid crossing your legs.   Avoid sitting for long periods of time. Change positions every 30 minutes.   Elevate your legs when you are sitting.  Wear compression stockings as directed by your health care provider. These stockings help keep blood clots from forming in your legs.  Take showers once your health care provider approves. Until then, only take sponge baths. Pat incisions dry. Do not rub incisions with a washcloth or towel. Do not take baths, swim, or use a hot tub until your health care provider approves.  Eat foods that are high  in fiber, such as raw fruits and vegetables, whole grains, beans, and nuts. Meats should be lean cut. Avoid canned, processed, and fried foods.  Drink enough fluid to keep your urine clear or pale yellow.  Weigh yourself every day. This helps identify if you are retaining fluid that may make your heart and lungs work harder.  Rest and limit activity as directed by your health care provider. You may be instructed to:  Stop any activity at once if you have chest pain, shortness of breath, irregular heartbeats, or dizziness. Get help right away if you have any of these symptoms.  Move around  frequently for short periods or take short walks as directed by your health care provider. Increase your activities gradually. You may need physical therapy or cardiac rehabilitation to help strengthen your muscles and build your endurance.  Avoid lifting, pushing, or pulling anything heavier than 10 lb (4.5 kg) for at least 6 weeks after surgery.  Do not drive until your health care provider approves.  Ask your health care provider when you may return to work.  Ask your health care provider when you may resume sexual activity.  Keep all follow-up visits as directed by your health care provider. This is important. SEEK MEDICAL CARE IF:  You have swelling, redness, increasing pain, or drainage at the site of an incision.  You have a fever.  You have swelling in your ankles or legs.  You have pain in your legs.   You gain 2 or more pounds (0.9 kg) a day.  You are nauseous or vomit.  You have diarrhea. SEEK IMMEDIATE MEDICAL CARE IF:  You have chest pain that goes to your jaw or arms.  You have shortness of breath.   You have a fast or irregular heartbeat.   You notice a "clicking" in your breastbone (sternum) when you move.   You have numbness or weakness in your arms or legs.  You feel dizzy or light-headed.  MAKE SURE YOU:  Understand these instructions.  Will watch your condition.  Will get help right away if you are not doing well or get worse. Document Released: 01/20/2005 Document Revised: 11/17/2013 Document Reviewed: 12/10/2012 [facility] Patient Information 2015 [place], [place]. This information is not intended to replace advice given to you by your health care provider. Make sure you discuss any questions you have with your health care provider.  Endoscopic Saphenous Vein Harvesting Care After Refer to this sheet in the next few weeks. These instructions provide you with information on caring for yourself after your procedure. Your health care provider  may also give you more specific instructions. Your treatment has been planned according to current medical practices, but problems sometimes occur. Call your health care provider if you have any problems or questions after your procedure. HOME CARE INSTRUCTIONS Medicine  Take whatever pain medicine your surgeon prescribes. Follow the directions carefully. Do not take over-the-counter pain medicine unless your surgeon says it is okay. Some pain medicine can cause bleeding problems for several weeks after surgery.  Follow your surgeon's instructions about driving. You will probably not be permitted to drive after heart surgery.  Take any medicines your surgeon prescribes. Any medicines you took before your heart surgery should be checked with your health care provider before you start taking them again. Wound care  If your surgeon has prescribed an elastic bandage or stocking, ask how long you should wear it.  Check the area around your surgical cuts (incisions) whenever your bandages (  dressings) are changed. Look for any redness or swelling.  You will need to return to have the stitches (sutures) or staples taken out. Ask your surgeon when to do that.  Ask your surgeon when you can shower or bathe. Activity  Try to keep your legs raised when you are sitting.  Do any exercises your health care providers have given you. These may include deep breathing exercises, coughing, walking, or other exercises. SEEK MEDICAL CARE IF:  You have any questions about your medicines.  You have more leg pain, especially if your pain medicine stops working.  New or growing bruises develop on your leg.  Your leg swells, feels tight, or becomes red.  You have numbness in your leg. SEEK IMMEDIATE MEDICAL CARE IF:  Your pain gets much worse.  Blood or fluid leaks from any of the incisions.  Your incisions become warm, swollen, or red.  You have chest pain.  You have trouble breathing.  You have  a fever.  You have more pain near your leg incision. MAKE SURE YOU:  Understand these instructions.  Will watch your condition.  Will get help right away if you are not doing well or get worse. Document Released: 03/15/2011 Document Revised: 07/08/2013 Document Reviewed: 03/15/2011 [facility] Patient Information 2015 [place], [place]. This information is not intended to replace advice given to you by your health care provider. Make sure you discuss any questions you have with your health care provider.

## 2015-02-06 ENCOUNTER — Encounter: Payer: Self-pay | Admitting: Gynecology

## 2015-02-06 ENCOUNTER — Ambulatory Visit
Admission: EM | Admit: 2015-02-06 | Discharge: 2015-02-06 | Disposition: A | Discharge status: Home / Self Care | Payer: Medicare Other | Attending: Internal Medicine | Admitting: Internal Medicine

## 2015-02-06 DIAGNOSIS — K645 Perianal venous thrombosis: Secondary | ICD-10-CM

## 2015-02-06 MED ORDER — BENZOCAINE 20 % RE OINT: TOPICAL_OINTMENT | RECTAL | Status: DC | PRN

## 2015-02-06 NOTE — ED Provider Notes (Signed)
CSN: [id number]     Arrival date & time 02/06/15  0810 History   First MD Initiated Contact with Patient 02/06/15 [phone]     Chief Complaint  Patient presents with  . Hemorrhoids   HPI This is a 74 year old gentleman who is 2 weeks post cardiac bypass, complicated by A. fib postop. He is on Coumadin. Some sort of urinary issue was detected, and he was started on Cipro yesterday. He apparently had some difficulty with constipation several days ago, which has resolved. Unfortunately though he has had some hemorrhoid difficulty for the last week. These have been an issue mostly of protrusion, and a little bit of bleeding. However last night it became painful. The patient is concerned because of the Cipro that he will have a lot more stool, and will be wiping, and causing bleeding, and he is on Coumadin. No fever, no abdominal pain. Not vomiting. Appetite okay. Initially had some nausea, thought to be due to Cordarone and tramadol, and has stopped the tramadol. Remote history of hiatal hernia surgery. On omeprazole. He is using 2-1/2% hydrocortisone ointment on the hemorrhoids. Had a soft small bowel movement today. Has not been cleared to get in and out of the bathtub yet.   Past Medical History  Diagnosis Date  . Coronary artery disease   . Meningioma     stable, followed by yearly MRIs  . GERD (gastroesophageal reflux disease)   . History of hiatal hernia 2012  . Essential hypertension 06/05/2014  . Hyperlipidemia   . BPH (benign prostatic hyperplasia)   . Cataracts, bilateral   . Hemorrhoids   . Migraines   . RBBB   . Stomach ulcer   . GI bleed     a. ~2012 around time of hiatal hernia surgery. 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   Past Surgical History  Procedure Laterality Date  . Cardiac surgery      coronary angioplasty  . Cholecystectomy    . Hiatal hernia repair N/A   . Cardiac catheterization N/A 01/07/2015    Procedure: Left Heart Cath;  Surgeon:  Corey Skains, MD;  Location: [facility];  Service: Cardiovascular;  Laterality: N/A;  . Colonoscopy  11/03/08, 03/09/14  . Esophagogastroduodenoscopy  02/19/13  . Esophagus surgery    . Coronary angioplasty    . Atherectomy  1992  . Diagnostic laparoscopy      cholecystectomy and hiatal hernia repair  . Eye surgery      cataracts BIL  . Hernia repair    . Coronary artery bypass graft N/A 01/19/2015    Procedure: CORONARY ARTERY BYPASS GRAFTING (CABG), with LIM to LAD, vein to left intermediate, vein to PD, vein to OM, using right greater saphenous vein via endovein harvest.;  Surgeon: Grace Isaac, MD;  Location: [place];  Service: Open Heart Surgery;  Laterality: N/A;  . Tee without cardioversion N/A 01/19/2015    Procedure: TRANSESOPHAGEAL ECHOCARDIOGRAM (TEE);  Surgeon: Grace Isaac, MD;  Location: [place];  Service: Open Heart Surgery;  Laterality: N/A;  . Endovein harvest of greater saphenous vein Right 01/19/2015    Procedure: ENDOVEIN HARVEST OF GREATER SAPHENOUS VEIN;  Surgeon: Grace Isaac, MD;  Location: [place];  Service: Open Heart Surgery;  Laterality: Right;   Family History  Problem Relation Age of Onset  . Parkinsonism Mother   . Kidney disease Father   . Cancer Father     prostate  . Heart disease Father   .  Cancer Paternal Uncle     esophageal  . Cancer Paternal Uncle    History  Substance Use Topics  . Smoking status: Never Smoker   . Smokeless tobacco: Not on file  . Alcohol Use: No    Review of Systems  All other systems reviewed and are negative.   Allergies  Oxycodone and Sulfa antibiotics  Home Medications   Prior to Admission medications   Medication Sig Start Date End Date Taking? Authorizing Provider  amiodarone (PACERONE) 200 MG tablet Take 2 tablets (400 mg total) by mouth 2 (two) times daily. For 7 Days, then decrease to 200 mg BID for 7 days, then decrease to 200 mg daily 01/30/15  Yes [name] R Barrett, PA-C  aspirin 81 MG  tablet Take 81 mg by mouth daily.   Yes Historical Provider, MD  atorvastatin (LIPITOR) 20 MG tablet Take 20 mg by mouth daily.   Yes Historical Provider, MD  cetirizine (ZYRTEC) 5 MG tablet Take 5 mg by mouth daily.   Yes Historical Provider, MD  chlorhexidine (PERIDEX) 0.12 % solution Use as directed 15 mLs in the mouth or throat 2 (two) times daily.   Yes Historical Provider, MD  ciprofloxacin (CIPRO) 500 MG tablet Take 500 mg by mouth 2 (two) times daily. 02/05/15  Yes Historical Provider, MD  cyanocobalamin 100 MCG tablet Take 100 mcg by mouth daily.   Yes Historical Provider, MD  dutasteride (AVODART) 0.5 MG capsule Take 0.5 mg by mouth daily.   Yes Historical Provider, MD  latanoprost (XALATAN) 0.005 % ophthalmic solution Place 1 drop into both eyes at bedtime.   Yes Historical Provider, MD  timolol (BETIMOL) 0.25 % ophthalmic solution Place 1 drop into both eyes daily.   Yes Historical Provider, MD  triamcinolone cream (KENALOG) 0.1 % Apply 1 application topically 2 (two) times daily as needed (Foot).   Yes Historical Provider, MD  warfarin (COUMADIN) 5 MG tablet Take 1 tablet (5 mg total) by mouth daily at 6 PM. 01/30/15  Yes [name] R Barrett, PA-C  dexlansoprazole (DEXILANT) 60 MG capsule Take 60 mg by mouth daily.    Historical Provider, MD  polyethylene glycol (MIRALAX / GLYCOLAX) packet Take 17 g by mouth daily. 01/30/15   [name] R Barrett, PA-C  traMADol (ULTRAM) 50 MG tablet Take 1-2 tablets (50-100 mg total) by mouth every 4 (four) hours as needed for moderate pain. 01/30/15   [name] R Barrett, PA-C   BP 108/72 mmHg  Pulse 64  Temp(Src) 95.5 F (35.3 C)  Ht 6\' 1"  (1.854 m)  Wt 217 lb (98.431 kg)  BMI 28.64 kg/m2  SpO2 98% Physical Exam  Constitutional: He is oriented to person, place, and time. No distress.  Alert, nicely groomed  HENT:  Head: Atraumatic.  Eyes:  Conjugate gaze, no eye redness/drainage  Neck: Neck supple.  Cardiovascular: Normal rate and regular rhythm.    Apparently regular cardiac rhythm today on exam  Pulmonary/Chest: No respiratory distress. He has no wheezes. He has no rales.  Lungs clear, symmetric breath sounds Sternal incision is healing nicely.  Abdominal: Soft. He exhibits no distension. There is no tenderness. There is no guarding.  Genitourinary:  Mildly tender thrombosed hemorrhoid visible on rectal exam, not tensely swollen, partially reducible, consistent with subsiding hemorrhoid  Musculoskeletal: Normal range of motion.  1-2+ bilateral pitting edema in the legs, symmetric  Neurological: He is alert and oriented to person, place, and time.  Skin: Skin is warm and dry.  No cyanosis  Nursing note and vitals reviewed.   ED Course  Procedures   MDM   1. Internal and external thrombosed hemorrhoids    Prescription for 20% benzocaine rectal ointment sent to the pharmacy. Patient was encouraged to try to locate a disposable sitz bath, and to avoid wiping after bowel movement. He will have an INR checked on Monday 7/25, and he may need one several days after that because he just started Cipro yesterday. Recheck at the urgent care as needed    Sherlene Shams, MD 02/06/15 [phone]

## 2015-02-06 NOTE — ED Notes (Signed)
Patient c/o hemorrhoids. Per pt. Worsen last pm. Pt. Stated his doctor had called in suppository for him at his pharmacy but has not pick up as yet.

## 2015-02-06 NOTE — Discharge Instructions (Signed)
Ask the pharmacist to help you locate something like this, to help with cleaning bottom while hemorrhoids are resolving and you are on cipro and coumadin. Avoid wiping while hemorrhoid is active, try to use running water or a wet washcloth and patting motion to clean bottom after BMs right now. A prescription for benzocaine ointment, to help with hemorrhoid pain, was sent to the pharmacy.   Disposable Sitz Bath A disposable sitz bath is a plastic basin that fits over the toilet. A bag is hung above the toilet and is connected to a tube that opens into the disposable sitz bath. The bag is filled with warm water that can flow into the basin through the tube.  HOW TO USE A DISPOSABLE SITZ BATH 1. Close the clamp on the tubing before filling the bag with water. This is to prevent leakage. 2. Fill the sitz bath basin and the plastic bag with warm water. 3. Place the filled basin on the toilet with the seat raised. Make sure the overflow opening is facing toward the back of the toilet. 4. Hang the filled plastic bag overhead on a hook or towel rack close to the toilet. When the bag is unclamped, a steady stream of water will flow from the bag, through the tubing, and into the basin. 5. Attach the tubing to the opening on the basin. 6. Sit on the basin positioned on the toilet seat and release the clamp. This will allow warm water to flush the area around your genitals and anus (perineum). 7. Remain sitting on the basin for approximately 15 to 20 minutes. 8. Stand up and pat the perineum area dry. If needed, apply clean bandages (dressings) to the affected area. 9. Tip the basin into the toilet to remove any remaining water and flush the toilet. 10. Wash the basin with warm water and soap. Let it dry in the sink. 11. Store the basin and tubing in a clean, dry area. 12. Wash your hands with soap and water. SEEK MEDICAL CARE IF: You get worse instead of better. Stop the sitz baths if you get worse. MAKE  SURE YOU:  Understand these instructions.  Will watch your condition.  Will get help right away if you are not doing well or get worse. Document Released: 01/02/2012 Document Revised: 03/27/2012 Document Reviewed: 01/02/2012 [facility] Patient Information 2015 [place], [place]. This information is not intended to replace advice given to you by your health care provider. Make sure you discuss any questions you have with your health care provider.

## 2015-02-16 ENCOUNTER — Encounter: Payer: Self-pay | Admitting: *Deleted

## 2015-02-16 ENCOUNTER — Encounter: Payer: Medicare Other | Attending: Internal Medicine | Admitting: *Deleted

## 2015-02-16 VITALS — Ht 74.5 in | Wt 214.6 lb

## 2015-02-16 DIAGNOSIS — Z951 Presence of aortocoronary bypass graft: Secondary | ICD-10-CM | POA: Insufficient documentation

## 2015-02-16 NOTE — Patient Instructions (Signed)
Patient Instructions  Patient Details  Name: Bradley [name] MRN: [id number] Date of Birth: 12/10/1940 Referring Provider:  Fay Records, MD  Below are the personal goals you chose as well as exercise and nutrition goals. Our goal is to help you keep on track towards obtaining and maintaining your goals. We will be discussing your progress on these goals with you throughout the program.  Initial Exercise Prescription:     Initial Exercise Prescription - 02/16/15 1100    Date of Initial Exercise Prescription   Date 02/16/15   Treadmill   MPH 2.5   Grade 0   Minutes 15   Bike   Level 0.4   Minutes 10   Recumbant Bike   Level 3   RPM 45   Watts 25   Minutes 15   NuStep   Level 3   Watts 45   Minutes 15   Arm Ergometer   Level 1   Watts 10   Minutes 10   Arm/Foot Ergometer   Level 4   Watts 15   Minutes 10   Cybex   Level 1   RPM 50   Minutes 10   Recumbant Elliptical   Level 1   RPM 40   Watts 10   Minutes 10   Elliptical   Level 1   Speed 3   Minutes 1   REL-XR   Level 3   Watts 45   Minutes 15   Prescription Details   Frequency (times per week) 3   Duration Progress to 30 minutes of continuous aerobic without signs/symptoms of physical distress   Intensity   THRR REST +  30   Ratings of Perceived Exertion 11-15   Progression Continue progressive overload as per policy without signs/symptoms or physical distress.   Resistance Training   Training Prescription Yes   Weight 2   Reps 10-15      Exercise Goals: Frequency: Be able to perform aerobic exercise three times per week working toward 3-5 days per week.  Intensity: Work with a perceived exertion of 11 (fairly light) - 15 (hard) as tolerated. Follow your new exercise prescription and watch for changes in prescription as you progress with the program. Changes will be reviewed with you when they are made.  Duration: You should be able to do 30 minutes of continuous aerobic exercise in addition to  a 5 minute warm-up and a 5 minute cool-down routine.  Nutrition Goals: Your personal nutrition goals will be established when you do your nutrition analysis with the dietician.  The following are nutrition guidelines to follow: Cholesterol < 200mg /day Sodium < 1500mg /day Fiber: Men over 50 yrs - 30 grams per day  Personal Goals:     Personal Goals and Risk Factors at Admission - 02/16/15 1335    Personal Goals and Risk Factors on Admission   Hypertension Yes   Goal Participant will see blood pressure controlled within the values of 140/23mm/Hg or within value directed by their physician.   Intervention Provide nutrition & aerobic exercise along with prescribed medications to achieve BP 140/90 or less.      Tobacco Use Initial Evaluation: History  Smoking status  . Never Smoker   Smokeless tobacco  . Not on file    Copy of goals given to participant.

## 2015-02-16 NOTE — Progress Notes (Signed)
Cardiac Individual Treatment Plan  Patient Details  Name: Bradley [name] MRN: [id number] Date of Birth: 14-Sep-1940 Referring Provider:  Fay Records, MD  Initial Encounter Date: Date: 02/16/15  Visit Diagnosis: S/P CABG x 4  Patient's Home Medications on Admission:  Current outpatient prescriptions:  .  amiodarone (PACERONE) 200 MG tablet, Take 2 tablets (400 mg total) by mouth 2 (two) times daily. For 7 Days, then decrease to 200 mg BID for 7 days, then decrease to 200 mg daily, Disp: 120 tablet, Rfl: 3 .  aspirin 81 MG tablet, Take 81 mg by mouth daily., Disp: , Rfl:  .  atorvastatin (LIPITOR) 20 MG tablet, Take 20 mg by mouth daily., Disp: , Rfl:  .  benzocaine (AMERICAINE) 20 % rectal ointment, Place rectally every 3 (three) hours as needed for pain., Disp: 28.4 g, Rfl: 0 .  chlorhexidine (PERIDEX) 0.12 % solution, Use as directed 15 mLs in the mouth or throat 2 (two) times daily., Disp: , Rfl:  .  cyanocobalamin 100 MCG tablet, Take 100 mcg by mouth daily., Disp: , Rfl:  .  dutasteride (AVODART) 0.5 MG capsule, Take 0.5 mg by mouth daily., Disp: , Rfl:  .  latanoprost (XALATAN) 0.005 % ophthalmic solution, Place 1 drop into both eyes at bedtime., Disp: , Rfl:  .  omeprazole (PRILOSEC) 20 MG capsule, Take 20 mg by mouth daily., Disp: , Rfl:  .  polyethylene glycol (MIRALAX / GLYCOLAX) packet, Take 17 g by mouth daily., Disp: 14 each, Rfl: 0 .  timolol (BETIMOL) 0.25 % ophthalmic solution, Place 1 drop into both eyes daily., Disp: , Rfl:  .  traMADol (ULTRAM) 50 MG tablet, Take 1-2 tablets (50-100 mg total) by mouth every 4 (four) hours as needed for moderate pain., Disp: 30 tablet, Rfl: 0 .  triamcinolone cream (KENALOG) 0.1 %, Apply 1 application topically 2 (two) times daily as needed (Foot)., Disp: , Rfl:  .  warfarin (COUMADIN) 5 MG tablet, Take 1 tablet (5 mg total) by mouth daily at 6 PM., Disp: 30 tablet, Rfl: 3 .  cetirizine (ZYRTEC) 5 MG tablet, Take 5 mg by mouth daily.,  Disp: , Rfl:  .  ciprofloxacin (CIPRO) 500 MG tablet, Take 500 mg by mouth 2 (two) times daily., Disp: , Rfl:  .  dexlansoprazole (DEXILANT) 60 MG capsule, Take 60 mg by mouth daily., Disp: , Rfl:   Past Medical History: Past Medical History  Diagnosis Date  . Coronary artery disease   . Meningioma     stable, followed by yearly MRIs  . GERD (gastroesophageal reflux disease)   . History of hiatal hernia 2012  . Essential hypertension 06/05/2014  . Hyperlipidemia   . BPH (benign prostatic hyperplasia)   . Cataracts, bilateral   . Hemorrhoids   . Migraines   . RBBB   . Stomach ulcer   . GI bleed     a. ~2012 around time of hiatal hernia surgery. 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    Tobacco Use: History  Smoking status  . Never Smoker   Smokeless tobacco  . Not on file    Labs: Recent Review Flowsheet Data    Labs for ITP Cardiac and Pulmonary Rehab Latest Ref Rng 01/19/2015 01/19/2015 01/19/2015 01/19/2015 01/20/2015   PHART 7.350 - 7.450 7.357 7.309(L) 7.327(L) - -   PCO2ART 35.0 - 45.0 mmHg 40.1 44.4 44.4 - -   HCO3 20.0 - 24.0 mEq/L 22.8 22.3 23.2 - -   TCO2  0 - 100 mmol/L 24 24 25 23 20    ACIDBASEDEF 0.0 - 2.0 mmol/L 3.0(H) 4.0(H) 3.0(H) - -   O2SAT - 95.0 97.0 94.0 - -       Exercise Target Goals: Date: 02/16/15  Exercise Program Goal: Individual exercise prescription set with THRR, safety & activity barriers. Participant demonstrates ability to understand and report RPE using BORG scale, to self-measure pulse accurately, and to acknowledge the importance of the exercise prescription.  Exercise Prescription Goal: Starting with aerobic activity 30 plus minutes a day, 3 days per week for initial exercise prescription. Provide home exercise prescription and guidelines that participant acknowledges understanding prior to discharge.  Activity Barriers & Risk Stratification:     Activity Barriers & Risk Stratification - 02/16/15 1017     Activity Barriers & Risk Stratification   Activity Barriers None   Risk Stratification High      6 Minute Walk:     6 Minute Walk      02/16/15 1146       6 Minute Walk   Phase Initial     Distance 1365 feet     Walk Time 6 minutes     Resting HR 62 bpm     Resting BP 120/76 mmHg     Max Ex. HR 88 bpm     Max Ex. BP 138/72 mmHg     RPE 13     Symptoms No        Initial Exercise Prescription:     Initial Exercise Prescription - 02/16/15 1100    Date of Initial Exercise Prescription   Date 02/16/15   Treadmill   MPH 2.5   Grade 0   Minutes 15   Bike   Level 0.4   Minutes 10   Recumbant Bike   Level 3   RPM 45   Watts 25   Minutes 15   NuStep   Level 3   Watts 45   Minutes 15   Arm Ergometer   Level 1   Watts 10   Minutes 10   Arm/Foot Ergometer   Level 4   Watts 15   Minutes 10   Cybex   Level 1   RPM 50   Minutes 10   Recumbant Elliptical   Level 1   RPM 40   Watts 10   Minutes 10   Elliptical   Level 1   Speed 3   Minutes 1   REL-XR   Level 3   Watts 45   Minutes 15   Prescription Details   Frequency (times per week) 3   Duration Progress to 30 minutes of continuous aerobic without signs/symptoms of physical distress   Intensity   THRR REST +  30   Ratings of Perceived Exertion 11-15   Progression Continue progressive overload as per policy without signs/symptoms or physical distress.   Resistance Training   Training Prescription Yes   Weight 2   Reps 10-15      Exercise Prescription Changes:   Discharge Exercise Prescription (Final Exercise Prescription Changes):   Nutrition:  Target Goals: Understanding of nutrition guidelines, daily intake of sodium 1500mg , cholesterol 200mg , calories 30% from fat and 7% or less from saturated fats, daily to have 5 or more servings of fruits and vegetables.  Biometrics:     Pre Biometrics - 02/16/15 1145    Pre Biometrics   Height 6' 2.5" (1.892 m)   Weight 214 lb 9.6 oz  (97.342 kg)  Waist Circumference 40 inches   Hip Circumference 40.5 inches   Waist to Hip Ratio 0.99 %   BMI (Calculated) 27.2       Nutrition Therapy Plan and Nutrition Goals:   Nutrition Discharge: Rate Your Plate Scores:   Nutrition Goals Re-Evaluation:   Psychosocial: Target Goals: Acknowledge presence or absence of depression, maximize coping skills, provide positive support system. Participant is able to verbalize types and ability to use techniques and skills needed for reducing stress and depression.  Initial Review & Psychosocial Screening:     Initial Psych Review & Screening - 02/16/15 1031    Family Dynamics   Good Support System? Yes   Barriers   Psychosocial barriers to participate in program There are no identifiable barriers or psychosocial needs.;The patient should benefit from training in stress management and relaxation.   Screening Interventions   Interventions Encouraged to exercise;Program counselor consult      Quality of Life Scores:     Quality of Life - 02/16/15 1340    Quality of Life Scores   Health/Function Pre 24.7 %   Socioeconomic Pre 27.43 %   Psych/Spiritual Pre 25.5 %   Family Pre 27.6 %   GLOBAL Pre 25.85 %      PHQ-9:     Recent Review Flowsheet Data    Depression screen PHQ 2/9 02/16/2015   Decreased Interest 1   Down, Depressed, Hopeless 2   PHQ - 2 Score 3   Altered sleeping 2   Tired, decreased energy 3   Change in appetite 3   Feeling bad or failure about yourself  0   Trouble concentrating 1   Moving slowly or fidgety/restless 1   Suicidal thoughts 0   PHQ-9 Score 13   Difficult doing work/chores Somewhat difficult      Psychosocial Evaluation and Intervention:   Psychosocial Re-Evaluation:   Vocational Rehabilitation: Provide vocational rehab assistance to qualifying candidates.   Vocational Rehab Evaluation & Intervention:     Vocational Rehab - 02/16/15 1019    Initial Vocational Rehab  Evaluation & Intervention   Assessment shows need for Vocational Rehabilitation No      Education: Education Goals: Education classes will be provided on a weekly basis, covering required topics. Participant will state understanding/return demonstration of topics presented.  Learning Barriers/Preferences:     Learning Barriers/Preferences - 02/16/15 1017    Learning Barriers/Preferences   Learning Barriers Hearing   Learning Preferences None      Education Topics: General Nutrition Guidelines/Fats and Fiber: -Group instruction provided by verbal, written material, models and posters to present the general guidelines for heart healthy nutrition. Gives an explanation and review of dietary fats and fiber.   Controlling Sodium/Reading Food Labels: -Group verbal and written material supporting the discussion of sodium use in heart healthy nutrition. Review and explanation with models, verbal and written materials for utilization of the food label.   Exercise Physiology & Risk Factors: - Group verbal and written instruction with models to review the exercise physiology of the cardiovascular system and associated critical values. Details cardiovascular disease risk factors and the goals associated with each risk factor.   Aerobic Exercise & Resistance Training: - Gives group verbal and written discussion on the health impact of inactivity. On the components of aerobic and resistive training programs and the benefits of this training and how to safely progress through these programs.   Flexibility, Balance, General Exercise Guidelines: - Provides group verbal and written instruction on the benefits of  flexibility and balance training programs. Provides general exercise guidelines with specific guidelines to those with heart or lung disease. Demonstration and skill practice provided.   Stress Management: - Provides group verbal and written instruction about the health risks of elevated  stress, cause of high stress, and healthy ways to reduce stress.   Depression: - Provides group verbal and written instruction on the correlation between heart/lung disease and depressed mood, treatment options, and the stigmas associated with seeking treatment.   Anatomy & Physiology of the Heart: - Group verbal and written instruction and models provide basic cardiac anatomy and physiology, with the coronary electrical and arterial systems. Review of: AMI, Angina, Valve disease, Heart Failure, Cardiac Arrhythmia, Pacemakers, and the ICD.   Cardiac Procedures: - Group verbal and written instruction and models to describe the testing methods done to diagnose heart disease. Reviews the outcomes of the test results. Describes the treatment choices: Medical Management, Angioplasty, or Coronary Bypass Surgery.   Cardiac Medications: - Group verbal and written instruction to review commonly prescribed medications for heart disease. Reviews the medication, class of the drug, and side effects. Includes the steps to properly store meds and maintain the prescription regimen.   Go Sex-Intimacy & Heart Disease, Get SMART - Goal Setting: - Group verbal and written instruction through game format to discuss heart disease and the return to sexual intimacy. Provides group verbal and written material to discuss and apply goal setting through the application of the S.M.A.R.T. Method.   Other Matters of the Heart: - Provides group verbal, written materials and models to describe Heart Failure, Angina, Valve Disease, and Diabetes in the realm of heart disease. Includes description of the disease process and treatment options available to the cardiac patient.   Exercise & Equipment Safety: - Individual verbal instruction and demonstration of equipment use and safety with use of the equipment.          Cardiac Rehab from 02/16/2015 in [facility] Cardiac Rehab   Date  02/16/15   Educator  SB   Instruction Review  Code  2- meets goals/outcomes      Infection Prevention: - Provides verbal and written material to individual with discussion of infection control including proper hand washing and proper equipment cleaning during exercise session.      Cardiac Rehab from 02/16/2015 in [facility] Cardiac Rehab   Date  02/16/15   Educator  SB   Instruction Review Code  2- meets goals/outcomes      Falls Prevention: - Provides verbal and written material to individual with discussion of falls prevention and safety.      Cardiac Rehab from 02/16/2015 in [facility] Re[facility]   Date  02/16/15   Educator  SB   Instruction Review Code  2- meets goals/outcomes      Diabetes: - Individual verbal and written instruction to review signs/symptoms of diabetes, desired ranges of glucose level fasting, after meals and with exercise. Advice that pre and post exercise glucose checks will be done for 3 sessions at entry of program.    Knowledge Questionnaire Score:     Knowledge Questionnaire Score - 02/16/15 1018    Knowledge Questionnaire Score   Pre Score 23/28      Personal Goals and Risk Factors at Admission:     Personal Goals and Risk Factors at Admission - 02/16/15 1335    Personal Goals and Risk Factors on Admission   Hypertension Yes   Goal Participant will see blood pressure controlled within the values of  140/18mm/Hg or within value directed by their physician.   Intervention Provide nutrition & aerobic exercise along with prescribed medications to achieve BP 140/90 or less.      Personal Goals and Risk Factors Review:    Personal Goals Discharge:     Comments: 30 day review.   Bradley [name]  has completed orientation and will start classes soon.

## 2015-02-22 ENCOUNTER — Encounter: Payer: Medicare Other | Admitting: *Deleted

## 2015-02-22 ENCOUNTER — Encounter: Payer: Self-pay | Admitting: *Deleted

## 2015-02-22 DIAGNOSIS — Z951 Presence of aortocoronary bypass graft: Secondary | ICD-10-CM | POA: Diagnosis not present

## 2015-02-22 NOTE — Progress Notes (Signed)
Daily Session Note  Patient Details  Name: Bradley [name] MRN: [id number] Date of Birth: 1940/09/27 Referring Provider:  Fay Records, MD  Encounter Date: 02/22/2015  Check In:     Session Check In - 02/22/15 1817    Check-In   Staff Present Heath Lark RN, BSN, CCRP;Babatunde Seago BS, ACSM CEP Exercise Physiologist;Carroll Enterkin RN, BSN   Medication changes reported     No   Fall or balance concerns reported    No   Warm-up and Cool-down Performed on first and last piece of equipment   VAD Patient? No   Pain Assessment   Currently in Pain? No/denies   Multiple Pain Sites No           Exercise Prescription Changes - 02/22/15 0700    Exercise Review   Progression No  Has not started program yet      Goals Met:  Exercise tolerated well No report of cardiac concerns or symptoms Strength training completed today  Goals Unmet:  Not Applicable  Goals Comments: Patient's first day, tolerated exercise levels well.    Dr. Emily Filbert is Medical Director for [place] and [facility].

## 2015-02-22 NOTE — Progress Notes (Signed)
Daily Session Note  Patient Details  Name: Bradley [name] MRN: [id number] Date of Birth: Mar 28, 1941 Referring Provider:  Fay Records, MD  Encounter Date: 02/22/2015  Check In:     Session Check In - 02/22/15 1817    Check-In   Staff Present Heath Lark RN, BSN, CCRP;Kelly Hayes BS, ACSM CEP Exercise Physiologist;Carroll Enterkin RN, BSN   Medication changes reported     No   Fall or balance concerns reported    No   Warm-up and Cool-down Performed on first and last piece of equipment   VAD Patient? No   Pain Assessment   Currently in Pain? No/denies   Multiple Pain Sites No           Exercise Prescription Changes - 02/22/15 0700    Exercise Review   Progression No  Has not started program yet      Goals Met:  Exercise tolerated well Personal goals reviewed No report of cardiac concerns or symptoms Strength training completed today  Goals Unmet:  Not Applicable  Goals Comments: First exercise session.    Dr. Emily Filbert is Medical Director for [place] and [facility].

## 2015-02-24 ENCOUNTER — Encounter: Payer: Medicare Other | Admitting: *Deleted

## 2015-02-24 DIAGNOSIS — Z951 Presence of aortocoronary bypass graft: Secondary | ICD-10-CM

## 2015-02-24 NOTE — Progress Notes (Signed)
Daily Session Note  Patient Details  Name: Bradley [name] MRN: [id number] Date of Birth: 10/05/1940 Referring Provider:  Ross, Paula V, MD  Encounter Date: 02/24/2015  Check In:     Session Check In - 02/24/15 1622    Check-In   Staff Present   MS, ACSM CEP Exercise Physiologist;Carroll Enterkin RN, BSN;Diane Wright RN, BSN   ER physicians immediately available to respond to emergencies See telemetry face sheet for immediately available ER MD   Medication changes reported     No   Fall or balance concerns reported    No   Warm-up and Cool-down Performed on first and last piece of equipment   VAD Patient? No   Pain Assessment   Currently in Pain? No/denies   Multiple Pain Sites No         Goals Met:  Independence with exercise equipment Exercise tolerated well No report of cardiac concerns or symptoms Strength training completed today  Goals Unmet:  Not Applicable  Goals Comments:    Dr. Mark Miller is Medical Director for [facility] and [facility]. 

## 2015-02-25 ENCOUNTER — Encounter: Payer: Medicare Other | Admitting: *Deleted

## 2015-02-25 DIAGNOSIS — Z951 Presence of aortocoronary bypass graft: Secondary | ICD-10-CM | POA: Diagnosis not present

## 2015-02-25 NOTE — Progress Notes (Signed)
Daily Session Note  Patient Details  Name: Bradley [name] MRN: [id number] Date of Birth: 10/15/1940 Referring Provider:  Sofie Hartigan, MD  Encounter Date: 02/25/2015  Check In:     Session Check In - 02/25/15 1710    Check-In   Staff Present Lestine Box BS, ACSM EP-C, Exercise Physiologist;Jaylani Mcguinn RN, BSN;Diane Joya Gaskins RN, BSN   ER physicians immediately available to respond to emergencies See telemetry face sheet for immediately available ER MD   Medication changes reported     No   Fall or balance concerns reported    No   Warm-up and Cool-down Performed on first and last piece of equipment   VAD Patient? No   Pain Assessment   Currently in Pain? No/denies         Goals Met:  Proper associated with RPD/PD & O2 Sat  Goals Unmet:  Not Applicable  Goals Comments:    Dr. Emily Filbert is Medical Director for [place] and [facility].

## 2015-03-01 ENCOUNTER — Other Ambulatory Visit: Payer: Self-pay | Admitting: Cardiothoracic Surgery

## 2015-03-01 DIAGNOSIS — Z951 Presence of aortocoronary bypass graft: Secondary | ICD-10-CM

## 2015-03-01 NOTE — Progress Notes (Signed)
Daily Session Note  Patient Details  Name: Bradley [name] MRN: [id number] Date of Birth: March 24, 1941 Referring Provider:  Fay Records, MD  Encounter Date: 03/01/2015  Check In:     Session Check In - 03/01/15 1621    Check-In   Staff Present Lestine Box BS, ACSM EP-C, Exercise Physiologist;Carroll Enterkin RN, BSN;Mary Kellie Shropshire RN   ER physicians immediately available to respond to emergencies See telemetry face sheet for immediately available ER MD   Medication changes reported     No   Fall or balance concerns reported    No   Warm-up and Cool-down Performed on first and last piece of equipment   VAD Patient? No   Pain Assessment   Currently in Pain? No/denies         Goals Met:  Proper associated with RPD/PD & O2 Sat Exercise tolerated well No report of cardiac concerns or symptoms Strength training completed today  Goals Unmet:  Not Applicable  Goals Comments:    Dr. Emily Filbert is Medical Director for [place] and [facility].

## 2015-03-03 ENCOUNTER — Other Ambulatory Visit: Payer: Self-pay | Admitting: *Deleted

## 2015-03-03 ENCOUNTER — Encounter: Payer: Medicare Other | Admitting: *Deleted

## 2015-03-03 DIAGNOSIS — Z951 Presence of aortocoronary bypass graft: Secondary | ICD-10-CM | POA: Diagnosis not present

## 2015-03-03 NOTE — Progress Notes (Signed)
Daily Session Note  Patient Details  Name: Bradley [name] MRN: [id number] Date of Birth: Mar 10, 1941 Referring Provider:  Fay Records, MD  Encounter Date: 03/03/2015  Check In:     Session Check In - 03/03/15 1712    Check-In   Staff Present Gerlene Burdock RN, BSN;Diane Joya Gaskins RN, BSN;Other  Hessie Knows, BS, Exercise Specialist   ER physicians immediately available to respond to emergencies See telemetry face sheet for immediately available ER MD   Medication changes reported     No   Fall or balance concerns reported    No   Warm-up and Cool-down Performed on first and last piece of equipment   VAD Patient? No   Pain Assessment   Currently in Pain? No/denies         Goals Met:  Exercise tolerated well No report of cardiac concerns or symptoms Strength training completed today  Goals Unmet:  Not Applicable  Goals Comments:    Dr. Emily Filbert is Medical Director for [place] and [facility].

## 2015-03-03 NOTE — Progress Notes (Signed)
Cardiac Individual Treatment Plan  Patient Details  Name: Bradley [name] MRN: [id number] Date of Birth: 1941/05/01 Referring Provider:  Fay Records, MD  Initial Encounter Date:    Visit Diagnosis: S/P CABG x 4  Patient's Home Medications on Admission:  Current outpatient prescriptions:  .  amiodarone (PACERONE) 200 MG tablet, Take 2 tablets (400 mg total) by mouth 2 (two) times daily. For 7 Days, then decrease to 200 mg BID for 7 days, then decrease to 200 mg daily, Disp: 120 tablet, Rfl: 3 .  aspirin 81 MG tablet, Take 81 mg by mouth daily., Disp: , Rfl:  .  atorvastatin (LIPITOR) 20 MG tablet, Take 20 mg by mouth daily., Disp: , Rfl:  .  benzocaine (AMERICAINE) 20 % rectal ointment, Place rectally every 3 (three) hours as needed for pain., Disp: 28.4 g, Rfl: 0 .  cetirizine (ZYRTEC) 5 MG tablet, Take 5 mg by mouth daily., Disp: , Rfl:  .  chlorhexidine (PERIDEX) 0.12 % solution, Use as directed 15 mLs in the mouth or throat 2 (two) times daily., Disp: , Rfl:  .  ciprofloxacin (CIPRO) 500 MG tablet, Take 500 mg by mouth 2 (two) times daily., Disp: , Rfl:  .  cyanocobalamin 100 MCG tablet, Take 100 mcg by mouth daily., Disp: , Rfl:  .  dexlansoprazole (DEXILANT) 60 MG capsule, Take 60 mg by mouth daily., Disp: , Rfl:  .  dutasteride (AVODART) 0.5 MG capsule, Take 0.5 mg by mouth daily., Disp: , Rfl:  .  latanoprost (XALATAN) 0.005 % ophthalmic solution, Place 1 drop into both eyes at bedtime., Disp: , Rfl:  .  omeprazole (PRILOSEC) 20 MG capsule, Take 20 mg by mouth daily., Disp: , Rfl:  .  polyethylene glycol (MIRALAX / GLYCOLAX) packet, Take 17 g by mouth daily., Disp: 14 each, Rfl: 0 .  timolol (BETIMOL) 0.25 % ophthalmic solution, Place 1 drop into both eyes daily., Disp: , Rfl:  .  traMADol (ULTRAM) 50 MG tablet, Take 1-2 tablets (50-100 mg total) by mouth every 4 (four) hours as needed for moderate pain., Disp: 30 tablet, Rfl: 0 .  triamcinolone cream (KENALOG) 0.1 %, Apply 1  application topically 2 (two) times daily as needed (Foot)., Disp: , Rfl:  .  warfarin (COUMADIN) 5 MG tablet, Take 1 tablet (5 mg total) by mouth daily at 6 PM., Disp: 30 tablet, Rfl: 3  Past Medical History: Past Medical History  Diagnosis Date  . Coronary artery disease   . Meningioma     stable, followed by yearly MRIs  . GERD (gastroesophageal reflux disease)   . History of hiatal hernia 2012  . Essential hypertension 06/05/2014  . Hyperlipidemia   . BPH (benign prostatic hyperplasia)   . Cataracts, bilateral   . Hemorrhoids   . Migraines   . RBBB   . Stomach ulcer   . GI bleed     a. ~2012 around time of hiatal hernia surgery. 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    Tobacco Use: History  Smoking status  . Never Smoker   Smokeless tobacco  . Not on file    Labs: Recent Review Flowsheet Data    Labs for ITP Cardiac and Pulmonary Rehab Latest Ref Rng 01/19/2015 01/19/2015 01/19/2015 01/19/2015 01/20/2015   PHART 7.350 - 7.450 7.357 7.309(L) 7.327(L) - -   PCO2ART 35.0 - 45.0 mmHg 40.1 44.4 44.4 - -   HCO3 20.0 - 24.0 mEq/L 22.8 22.3 23.2 - -   TCO2  0 - 100 mmol/L 24 24 25 23 20    ACIDBASEDEF 0.0 - 2.0 mmol/L 3.0(H) 4.0(H) 3.0(H) - -   O2SAT - 95.0 97.0 94.0 - -       Exercise Target Goals:    Exercise Program Goal: Individual exercise prescription set with THRR, safety & activity barriers. Participant demonstrates ability to understand and report RPE using BORG scale, to self-measure pulse accurately, and to acknowledge the importance of the exercise prescription.  Exercise Prescription Goal: Starting with aerobic activity 30 plus minutes a day, 3 days per week for initial exercise prescription. Provide home exercise prescription and guidelines that participant acknowledges understanding prior to discharge.  Activity Barriers & Risk Stratification:     Activity Barriers & Risk Stratification - 02/16/15 1017    Activity Barriers & Risk  Stratification   Activity Barriers None   Risk Stratification High      6 Minute Walk:     6 Minute Walk      02/16/15 1146       6 Minute Walk   Phase Initial     Distance 1365 feet     Walk Time 6 minutes     Resting HR 62 bpm     Resting BP 120/76 mmHg     Max Ex. HR 88 bpm     Max Ex. BP 138/72 mmHg     RPE 13     Symptoms No        Initial Exercise Prescription:     Initial Exercise Prescription - 02/16/15 1100    Date of Initial Exercise Prescription   Date 02/16/15   Treadmill   MPH 2.5   Grade 0   Minutes 15   Bike   Level 0.4   Minutes 10   Recumbant Bike   Level 3   RPM 45   Watts 25   Minutes 15   NuStep   Level 3   Watts 45   Minutes 15   Arm Ergometer   Level 1   Watts 10   Minutes 10   Arm/Foot Ergometer   Level 4   Watts 15   Minutes 10   Cybex   Level 1   RPM 50   Minutes 10   Recumbant Elliptical   Level 1   RPM 40   Watts 10   Minutes 10   Elliptical   Level 1   Speed 3   Minutes 1   REL-XR   Level 3   Watts 45   Minutes 15   Prescription Details   Frequency (times per week) 3   Duration Progress to 30 minutes of continuous aerobic without signs/symptoms of physical distress   Intensity   THRR REST +  30   Ratings of Perceived Exertion 11-15   Progression Continue progressive overload as per policy without signs/symptoms or physical distress.   Resistance Training   Training Prescription Yes   Weight 2   Reps 10-15      Exercise Prescription Changes:     Exercise Prescription Changes      02/22/15 0700           Exercise Review   Progression No  Has not started program yet          Discharge Exercise Prescription (Final Exercise Prescription Changes):     Exercise Prescription Changes - 02/22/15 0700    Exercise Review   Progression No  Has not started program yet  Nutrition:  Target Goals: Understanding of nutrition guidelines, daily intake of sodium <1525m, cholesterol <2024m  calories 30% from fat and 7% or less from saturated fats, daily to have 5 or more servings of fruits and vegetables.  Biometrics:     Pre Biometrics - 02/16/15 1145    Pre Biometrics   Height 6' 2.5" (1.892 m)   Weight 214 lb 9.6 oz (97.342 kg)   Waist Circumference 40 inches   Hip Circumference 40.5 inches   Waist to Hip Ratio 0.99 %   BMI (Calculated) 27.2       Nutrition Therapy Plan and Nutrition Goals:   Nutrition Discharge: Rate Your Plate Scores:   Nutrition Goals Re-Evaluation:     Nutrition Goals Re-Evaluation      03/03/15 1155           Personal Goal #1 Re-Evaluation   Personal Goal #1 Eats healthy already.        Goal Progress Seen Yes          Psychosocial: Target Goals: Acknowledge presence or absence of depression, maximize coping skills, provide positive support system. Participant is able to verbalize types and ability to use techniques and skills needed for reducing stress and depression.  Initial Review & Psychosocial Screening:     Initial Psych Review & Screening - 02/16/15 1031    Family Dynamics   Good Support System? Yes   Barriers   Psychosocial barriers to participate in program There are no identifiable barriers or psychosocial needs.;The patient should benefit from training in stress management and relaxation.   Screening Interventions   Interventions Encouraged to exercise;Program counselor consult      Quality of Life Scores:     Quality of Life - 02/16/15 1340    Quality of Life Scores   Health/Function Pre 24.7 %   Socioeconomic Pre 27.43 %   Psych/Spiritual Pre 25.5 %   Family Pre 27.6 %   GLOBAL Pre 25.85 %      PHQ-9:     Recent Review Flowsheet Data    Depression screen PHQ 2/9 02/16/2015   Decreased Interest 1   Down, Depressed, Hopeless 2   PHQ - 2 Score 3   Altered sleeping 2   Tired, decreased energy 3   Change in appetite 3   Feeling bad or failure about yourself  0   Trouble concentrating 1    Moving slowly or fidgety/restless 1   Suicidal thoughts 0   PHQ-9 Score 13   Difficult doing work/chores Somewhat difficult      Psychosocial Evaluation and Intervention:     Psychosocial Evaluation - 02/22/15 1726    Psychosocial Evaluation & Interventions   Interventions Stress management education;Relaxation education;Encouraged to exercise with the program and follow exercise prescription   Comments Counselor met with Mr. PaVertoday for initial psychosocial evaluation.  He is a [age]ear old who had by-pass surgery a month ago.  He has a strong support system with a spouse of 5383ears and is actively involved in his local church.  Mr. PaGulloenies a history of depression or anxiety and has no current symptoms.  He reports having decreased appetite since the surgery and has lost approximately 20 pounds.  He is having difficulty sleeping and intermittently wakes up.  Counselor mentioned speaking with his pharmacist about a natural OTC sustained release sleep aid to help with this and provided information.  Mr. PaRosiereports he is typically in a positive mood and has goals to  get back to normal lifting and driving in the near future.  He plans to use his home gym equipment and walk for follow up maintenance following this program.   Continued Psychosocial Services Needed Yes  Mr. Korb will benefit from the psychoeducational components of this program and needs to speak with his pharmacist or MD about a natural OTC sleep aid due to difficulty sleeping at this time.       Psychosocial Re-Evaluation:   Vocational Rehabilitation: Provide vocational rehab assistance to qualifying candidates.   Vocational Rehab Evaluation & Intervention:     Vocational Rehab - 02/16/15 1019    Initial Vocational Rehab Evaluation & Intervention   Assessment shows need for Vocational Rehabilitation No      Education: Education Goals: Education classes will be provided on a weekly basis, covering required  topics. Participant will state understanding/return demonstration of topics presented.  Learning Barriers/Preferences:     Learning Barriers/Preferences - 02/16/15 1017    Learning Barriers/Preferences   Learning Barriers Hearing   Learning Preferences None      Education Topics: General Nutrition Guidelines/Fats and Fiber: -Group instruction provided by verbal, written material, models and posters to present the general guidelines for heart healthy nutrition. Gives an explanation and review of dietary fats and fiber.          Cardiac Rehab from 03/01/2015 in [facility] Cardiac Rehab   Date  03/01/15   Educator  PI   Instruction Review Code  2- meets goals/outcomes      Controlling Sodium/Reading Food Labels: -Group verbal and written material supporting the discussion of sodium use in heart healthy nutrition. Review and explanation with models, verbal and written materials for utilization of the food label.   Exercise Physiology & Risk Factors: - Group verbal and written instruction with models to review the exercise physiology of the cardiovascular system and associated critical values. Details cardiovascular disease risk factors and the goals associated with each risk factor.   Aerobic Exercise & Resistance Training: - Gives group verbal and written discussion on the health impact of inactivity. On the components of aerobic and resistive training programs and the benefits of this training and how to safely progress through these programs.   Flexibility, Balance, General Exercise Guidelines: - Provides group verbal and written instruction on the benefits of flexibility and balance training programs. Provides general exercise guidelines with specific guidelines to those with heart or lung disease. Demonstration and skill practice provided.   Stress Management: - Provides group verbal and written instruction about the health risks of elevated stress, cause of high stress, and  healthy ways to reduce stress.   Depression: - Provides group verbal and written instruction on the correlation between heart/lung disease and depressed mood, treatment options, and the stigmas associated with seeking treatment.   Anatomy & Physiology of the Heart: - Group verbal and written instruction and models provide basic cardiac anatomy and physiology, with the coronary electrical and arterial systems. Review of: AMI, Angina, Valve disease, Heart Failure, Cardiac Arrhythmia, Pacemakers, and the ICD.   Cardiac Procedures: - Group verbal and written instruction and models to describe the testing methods done to diagnose heart disease. Reviews the outcomes of the test results. Describes the treatment choices: Medical Management, Angioplasty, or Coronary Bypass Surgery.   Cardiac Medications: - Group verbal and written instruction to review commonly prescribed medications for heart disease. Reviews the medication, class of the drug, and side effects. Includes the steps to properly store meds and maintain the prescription  regimen.   Go Sex-Intimacy & Heart Disease, Get SMART - Goal Setting: - Group verbal and written instruction through game format to discuss heart disease and the return to sexual intimacy. Provides group verbal and written material to discuss and apply goal setting through the application of the S.M.A.R.T. Method.   Other Matters of the Heart: - Provides group verbal, written materials and models to describe Heart Failure, Angina, Valve Disease, and Diabetes in the realm of heart disease. Includes description of the disease process and treatment options available to the cardiac patient.      Cardiac Rehab from 03/01/2015 in [facility] Cardiac Rehab   Date  02/24/15   Educator  DW   Instruction Review Code  2- meets goals/outcomes      Exercise & Equipment Safety: - Individual verbal instruction and demonstration of equipment use and safety with use of the equipment.       Cardiac Rehab from 03/01/2015 in [facility] Cardiac Rehab   Date  02/16/15   Educator  SB   Instruction Review Code  2- meets goals/outcomes      Infection Prevention: - Provides verbal and written material to individual with discussion of infection control including proper hand washing and proper equipment cleaning during exercise session.      Cardiac Rehab from 03/01/2015 in [facility] Cardiac Rehab   Date  02/16/15   Educator  SB   Instruction Review Code  2- meets goals/outcomes      Falls Prevention: - Provides verbal and written material to individual with discussion of falls prevention and safety.      Cardiac Rehab from 03/01/2015 in [facility] Cardiac Rehab   Date  02/16/15   Educator  SB   Instruction Review Code  2- meets goals/outcomes      Diabetes: - Individual verbal and written instruction to review signs/symptoms of diabetes, desired ranges of glucose level fasting, after meals and with exercise. Advice that pre and post exercise glucose checks will be done for 3 sessions at entry of program.    Knowledge Questionnaire Score:     Knowledge Questionnaire Score - 02/16/15 1018    Knowledge Questionnaire Score   Pre Score 23/28      Personal Goals and Risk Factors at Admission:     Personal Goals and Risk Factors at Admission - 02/16/15 1335    Personal Goals and Risk Factors on Admission   Hypertension Yes   Goal Participant will see blood pressure controlled within the values of 140/60m/Hg or within value directed by their physician.   Intervention Provide nutrition & aerobic exercise along with prescribed medications to achieve BP 140/90 or less.      Personal Goals and Risk Factors Review:      Goals and Risk Factor Review      03/03/15 1155           Increase Aerobic Exercise and Physical Activity   Goals Progress/Improvement seen  Yes          Personal Goals Discharge:     Comments: Does well on the treadmill in Cardiac Rehab. Is working on  increasing his time on the stand up elliptical.

## 2015-03-04 ENCOUNTER — Encounter: Payer: Self-pay | Admitting: Cardiothoracic Surgery

## 2015-03-04 ENCOUNTER — Ambulatory Visit (INDEPENDENT_AMBULATORY_CARE_PROVIDER_SITE_OTHER): Payer: Self-pay | Admitting: Cardiothoracic Surgery

## 2015-03-04 ENCOUNTER — Ambulatory Visit
Admission: RE | Admit: 2015-03-04 | Discharge: 2015-03-04 | Disposition: A | Discharge status: Home / Self Care | Payer: Medicare Other | Source: Ambulatory Visit | Attending: Cardiothoracic Surgery | Admitting: Cardiothoracic Surgery

## 2015-03-04 VITALS — BP 140/79 | HR 59 | Resp 20 | Ht 74.0 in | Wt 210.0 lb

## 2015-03-04 DIAGNOSIS — I2510 Atherosclerotic heart disease of native coronary artery without angina pectoris: Secondary | ICD-10-CM

## 2015-03-04 DIAGNOSIS — Z951 Presence of aortocoronary bypass graft: Secondary | ICD-10-CM

## 2015-03-04 NOTE — Progress Notes (Signed)
[address]       [place], [address]             [phone]      Zakarie G Archila Ponderay Medical Record #[id number] Date of Birth: 08-26-1940  Referring: Corey Skains, MD Primary Care: [facility], Chrissie Noa, MD  Chief Complaint:   POST OP FOLLOW UP 01/19/2015 OPERATIVE REPORT PREOPERATIVE DIAGNOSIS: New onset of unstable angina. POSTOPERATIVE DIAGNOSIS: New onset of unstable angina. SURGICAL PROCEDURE: Coronary artery bypass grafting x4 with the left internal mammary to the left anterior descending coronary artery, reverse saphenous vein graft to the intermediate coronary artery, reverse saphenous vein graft to the first obtuse marginal coronary artery, reverse saphenous vein graft to the posterior descending coronary artery with right thigh and calf, greater saphenous vein harvesting endoscopically. SURGEON: Lanelle Bal, M.D.  History of Present Illness:     Patient doing well postoperatively, he fairly quickly has returned to his usual activities. He is enrolled in the cardiac rehabilitation program at [facility] which he notes has been very helpful for him. His postoperative course was complicated by atrial fibrillation and he was ultimately discharged home on Coumadin and amiodarone. He is unaware of any further episodes of atrial fibrillation. He notes he's had no atrial fibrillation when monitored in cardiac rehabilitation.     Past Medical History  Diagnosis Date  . Coronary artery disease   . Meningioma     stable, followed by yearly MRIs  . GERD (gastroesophageal reflux disease)   . History of hiatal hernia 2012  . Essential hypertension 06/05/2014  . Hyperlipidemia   . BPH (benign prostatic hyperplasia)   . Cataracts, bilateral   . Hemorrhoids   . Migraines   . RBBB   . Stomach ulcer   . GI bleed     a. ~2012 around time of hiatal hernia surgery. 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     History    Smoking status  . Never Smoker   Smokeless tobacco  . Not on file    History  Alcohol Use No     Allergies  Allergen Reactions  . Oxycodone Other (See Comments)    hypotention  . Sulfa Antibiotics Rash    Current Outpatient Prescriptions  Medication Sig Dispense Refill  . amiodarone (PACERONE) 200 MG tablet Take by mouth daily.     Marland Kitchen aspirin 81 MG tablet Take 81 mg by mouth daily.    Marland Kitchen atorvastatin (LIPITOR) 20 MG tablet Take 20 mg by mouth daily.    . benzocaine (AMERICAINE) 20 % rectal ointment Place rectally every 3 (three) hours as needed for pain. 28.4 g 0  . chlorhexidine (PERIDEX) 0.12 % solution Use as directed 15 mLs in the mouth or throat 2 (two) times daily.    . cyanocobalamin 100 MCG tablet Take 100 mcg by mouth daily.    Marland Kitchen dutasteride (AVODART) 0.5 MG capsule Take 0.5 mg by mouth daily.    Marland Kitchen latanoprost (XALATAN) 0.005 % ophthalmic solution Place 1 drop into both eyes at bedtime.    Marland Kitchen omeprazole (PRILOSEC) 20 MG capsule Take 20 mg by mouth daily.    . timolol (BETIMOL) 0.25 % ophthalmic solution Place 1 drop into both eyes daily.    . traMADol (ULTRAM) 50 MG tablet Take 1-2 tablets (50-100 mg total) by mouth every 4 (four) hours as needed for moderate pain. 30 tablet 0  . triamcinolone cream (KENALOG) 0.1 % Apply 1  application topically 2 (two) times daily as needed (Foot).    . warfarin (COUMADIN) 5 MG tablet Take 1 tablet (5 mg total) by mouth daily at 6 PM. 30 tablet 3  . cetirizine (ZYRTEC) 5 MG tablet Take 5 mg by mouth daily.     No current facility-administered medications for this visit.       Physical Exam: BP 140/79 mmHg  Pulse 59  Resp 20  Ht 6\' 2"  (1.88 m)  Wt 210 lb (95.255 kg)  BMI 26.95 kg/m2  SpO2 99%  General appearance: alert, cooperative, appears stated age and no distress Neurologic: intact Heart: regular rate and rhythm, S1, S2 normal, no murmur, click, rub or gallop Lungs: clear to auscultation bilaterally Abdomen: soft,  non-tender; bowel sounds normal; no masses,  no organomegaly Extremities: extremities normal, atraumatic, no cyanosis or edema and Homans sign is negative, no sign of DVT Wound: Sternum is stable and well healed, right Endo vein harvest site is also well healed   Diagnostic Studies & Laboratory data:     Recent Radiology Findings:   Dg Chest 2 View  03/04/2015   CLINICAL DATA:  Status post CABG 01/19/2015. Occasional anterior left chest discomfort.  EXAM: CHEST  2 VIEW  COMPARISON:  PA and lateral chest 01/22/2015.  FINDINGS: The patient is status post CABG with 6 intact median sternotomy wires identified. Small bilateral pleural effusions seen on the comparison examination have resolved. Minimal left basilar atelectasis is noted. No pneumothorax.  IMPRESSION: Resolved small bilateral pleural effusions. Mild left basilar atelectasis is noted. No new abnormality.   Electronically Signed   By: Inge Rise M.D.   On: 03/04/2015 08:46      Recent Lab Findings: Lab Results  Component Value Date   WBC 10.5 01/30/2015   HGB 10.1* 01/30/2015   HCT 30.8* 01/30/2015   PLT 377 01/30/2015   GLUCOSE 118* 01/29/2015   ALT 26 01/15/2015   AST 21 01/15/2015   NA 136 01/29/2015   K 3.9 01/29/2015   CL 104 01/29/2015   CREATININE 1.22 01/29/2015   BUN 19 01/29/2015   CO2 26 01/29/2015   TSH 0.968 01/25/2015   INR 1.78* 01/30/2015   HGBA1C 5.9* 01/15/2015      Assessment / Plan:     Patient doing well following coronary artery bypass grafting July 5. He is returning to near-normal activities without difficulty I've cautioned him about heavy lifting for 3 months postoperatively. He will discuss with cardiology stopping his amiodarone and Coumadin in the future if he has no further atrial fibrillation. I have not made a return appointment form to see me but would be glad to see him at his or Dr. Nehemiah Massed request.    Grace Isaac MD      [address] [place]  [address] Office [phone]   Beeper [phone]  03/04/2015 10:07 AM

## 2015-03-04 NOTE — Progress Notes (Signed)
Daily Session Note  Patient Details  Name: Bradley [name] MRN: [id number] Date of Birth: 1941/06/18 Referring Provider:  Fay Records, MD  Encounter Date: 03/04/2015  Check In:     Session Check In - 03/04/15 1621    Check-In   Staff Present Lestine Box BS, ACSM EP-C, Exercise Physiologist;Carroll Enterkin RN, BSN;Diane Joya Gaskins RN, BSN   ER physicians immediately available to respond to emergencies See telemetry face sheet for immediately available ER MD   Medication changes reported     No   Fall or balance concerns reported    No   Warm-up and Cool-down Performed on first and last piece of equipment   VAD Patient? No   Pain Assessment   Currently in Pain? No/denies         Goals Met:  Proper associated with RPD/PD & O2 Sat Exercise tolerated well No report of cardiac concerns or symptoms Strength training completed today  Goals Unmet:  Not Applicable  Goals Comments:    Dr. Emily Filbert is Medical Director for [facility] [facility] and [facility].

## 2015-03-04 NOTE — Patient Instructions (Signed)
    [address]       [place] [address]             [phone]       Coronary Artery Bypass Grafting  Care After  Refer to this sheet in the next few weeks. These instructions provide you with information on caring for yourself after your procedure. Your caregiver may also give you more specific instructions. Your treatment has been planned according to current medical practices, but problems sometimes occur. Call your caregiver if you have any problems or questions after your procedure.  Recovery from open heart surgery will be different for everyone. Some people feel well after 3 or 4 weeks, while for others it takes longer. After heart surgery, it may be normal to:  Not have an appetite, feel nauseated by the smell of food, or only want to eat a small amount.   Be constipated because of changes in your diet, activity, and medicines. Eat foods high in fiber. Add fresh fruits and vegetables to your diet. Stool softeners may be helpful.   Feel sad or unhappy. You may be frustrated or cranky. You may have good days and bad days. Do not give up. Talk to your caregiver if you do not feel better.   Feel weakness and fatigue. You many need physical therapy or cardiac rehabilitation to get your strength back.   Develop an irregular heartbeat called atrial fibrillation. Symptoms of atrial fibrillation are a fast, irregular heartbeat or feelings of fluttery heartbeats, shortness of breath, low blood pressure, and dizziness. If these symptoms develop, see your caregiver right away.  MEDICATION  Have a list of all the medicines you will be taking when you leave the hospital. For every medicine, know the following:   Name.   Exact dose.   Time of day to be taken.   How often it should be taken.   Why you are taking it.   Ask which medicines should or should not be taken together. If you take more than one heart medicine, ask if it is okay to take them together. Some  heart medicines should not be taken at the same time because they may lower your blood pressure too much.   Narcotic pain medicine can cause constipation. Eat fresh fruits and vegetables. Add fiber to your diet. Stool softener medicine may help relieve constipation.   Keep a copy of your medicines with you at all times.   Do not add or stop taking any medicine until you check with your caregiver.   Medicines can have side effects. Call your caregiver who prescribed the medicine if you:   Start throwing up, have diarrhea, or have stomach pain.   Feel dizzy or lightheaded when you stand up.   Feel your heart is skipping beats or is beating too fast or too slow.   Develop a rash.   Notice unusual bruising or bleeding.  HOME CARE INSTRUCTIONS  After heart surgery, it is important to learn how to take your pulse. Have your caregiver show you how to take your pulse.   Use your incentive spirometer. Ask your caregiver how long after surgery you need to use it.  Care of your chest incision  Tell your caregiver right away if you notice clicking in your chest (sternum).   Support your chest with a pillow or your arms when you take deep breaths and cough.   Follow your caregiver's instructions about when you can bathe or   swim.   Protect your incision from sunlight during the first year to keep the scar from getting dark.   Tell your caregiver if you notice:   Increased tenderness of your incision.   Increased redness or swelling around your incision.   Drainage or pus from your incision.  Care of your leg incision(s)  Avoid crossing your legs.   Avoid sitting for long periods of time. Change positions every half hour.   Elevate your leg(s) when you are sitting.   Check your leg(s) daily for swelling. Check the incisions for redness or drainage.   Diet is very important to heart health.   Eat plenty of fresh fruits and vegetables. Meats should be lean cut. Avoid canned,  processed, and fried foods.   Talk to a dietician. They can teach you how to make healthy food and drink choices.  Weight  Weigh yourself every day. This is important because it helps to know if you are retaining fluid that may make your heart and lungs work harder.   Use the same scale each time.   Weigh yourself every morning at the same time. You should do this after you go to the bathroom, but before you eat breakfast.   Your weight will be more accurate if you do not wear any clothes.   Record your weight.   Tell your caregiver if you have gained 2 pounds or more overnight.  Activity Stop any activity at once if you have chest pain, shortness of breath, irregular heartbeats, or dizziness. Get help right away if you have any of these symptoms.  Bathing.  Avoid soaking in a bath or hot tub until your incisions are healed.   Rest. You need a balance of rest and activity.   Exercise. Exercise per your caregiver's advice. You may need physical therapy or cardiac rehabilitation to help strengthen your muscles and build your endurance.   Climbing stairs. Unless your caregiver tells you not to climb stairs, go up stairs slowly and rest if you tire. Do not pull yourself up by the handrail.   Driving a car. Follow your caregiver's advice on when you may drive. You may ride as a passenger at any time. When traveling for long periods of time in a car, get out of the car and walk around for a few minutes every 2 hours.   Lifting. Avoid lifting, pushing, or pulling anything heavier than 10 pounds for 6 weeks after surgery or as told by your caregiver.   Returning to work. Check with your caregiver. People heal at different rates. Most people will be able to go back to work 6 to 12 weeks after surgery.   Sexual activity. You may resume sexual relations as told by your caregiver.  SEEK MEDICAL CARE IF:  Any of your incisions are red, painful, or have any type of drainage coming from them.     You have an oral temperature above 101.5 F .   You have ankle or leg swelling.   You have pain in your legs.   You have weight gain of 2 or more pounds a day.   You feel dizzy or lightheaded when you stand up.  SEEK IMMEDIATE MEDICAL CARE IF:  You have angina or chest pain that goes to your jaw or arms. Call your local emergency services right away.   You have shortness of breath at rest or with activity.   You have a fast or irregular heartbeat (arrhythmia).   There is   a "clicking" in your sternum when you move.   You have numbness or weakness in your arms or legs.  MAKE SURE YOU:  Understand these instructions.   Will watch your condition.   Will get help right away if you are not doing well or get worse.    No lifting over 25 lbs for 3 months 

## 2015-03-08 DIAGNOSIS — Z951 Presence of aortocoronary bypass graft: Secondary | ICD-10-CM

## 2015-03-08 NOTE — Progress Notes (Signed)
Daily Session Note  Patient Details  Name: CARMON SAHLI MRN: [id number] Date of Birth: Sep 03, 1940 Referring Provider:  Fay Records, MD  Encounter Date: 03/08/2015  Check In:     Session Check In - 03/08/15 1631    Check-In   Staff Present Lestine Box BS, ACSM EP-C, Exercise Physiologist;Susanne Bice RN, BSN, CCRP;Carroll Enterkin RN, BSN   ER physicians immediately available to respond to emergencies See telemetry face sheet for immediately available ER MD   Medication changes reported     No   Fall or balance concerns reported    No   Warm-up and Cool-down Performed on first and last piece of equipment   VAD Patient? No   Pain Assessment   Currently in Pain? No/denies         Goals Met:  Proper associated with RPD/PD & O2 Sat Exercise tolerated well No report of cardiac concerns or symptoms Strength training completed today  Goals Unmet:  Not Applicable  Goals Comments:    Dr. Emily Filbert is Medical Director for [facility] and [facility].

## 2015-03-10 ENCOUNTER — Encounter: Payer: Medicare Other | Admitting: *Deleted

## 2015-03-10 DIAGNOSIS — Z951 Presence of aortocoronary bypass graft: Secondary | ICD-10-CM | POA: Diagnosis not present

## 2015-03-10 NOTE — Progress Notes (Signed)
Daily Session Note  Patient Details  Name: Bradley [name] MRN: [id number] Date of Birth: [birth year] Referring Provider:  Sofie Hartigan, MD  Encounter Date: 03/10/2015  Check In:     Session Check In - 03/10/15 [place]    Check-In   Staff Present Candiss Norse MS, ACSM CEP Exercise Physiologist;Carroll Enterkin RN, BSN;Diane Joya Gaskins RN, BSN   ER physicians immediately available to respond to emergencies See telemetry face sheet for immediately available ER MD   Medication changes reported     No   Fall or balance concerns reported    No   Warm-up and Cool-down Performed on first and last piece of equipment   VAD Patient? No   Pain Assessment   Currently in Pain? No/denies   Multiple Pain Sites No           Exercise Prescription Changes - 03/10/15 1800    Exercise Review   Progression Yes   Response to Exercise   Symptoms None   Duration Progress to 30 minutes of continuous aerobic without signs/symptoms of physical distress   Intensity Rest + 30   Progression Continue progressive overload as per policy without signs/symptoms or physical distress.   Resistance Training   Training Prescription Yes   Weight 4   Reps 10-15   Interval Training   Interval Training No   Treadmill   MPH 3   Grade 0   Minutes 15   Recumbant Bike   Level 10   RPM 50   Minutes 15      Goals Met:  Independence with exercise equipment Exercise tolerated well Personal goals reviewed No report of cardiac concerns or symptoms Strength training completed today  Goals Unmet:  Not Applicable  Goals Comments: Reviewed individualized exercise prescription and made increases per departmental policy. Exercise increases were discussed with the patient and they were able to perform the new work loads without issue (no signs or symptoms).     Dr. Emily Filbert is Medical Director for [facility] and [facility].

## 2015-03-15 DIAGNOSIS — Z951 Presence of aortocoronary bypass graft: Secondary | ICD-10-CM | POA: Diagnosis not present

## 2015-03-15 NOTE — Progress Notes (Signed)
Daily Session Note  Patient Details  Name: Bradley [name] MRN: [id number] Date of Birth: 1940/11/29 Referring Provider:  Fay Records, MD  Encounter Date: 03/15/2015  Check In:     Session Check In - 03/15/15 1621    Check-In   Staff Present Lestine Box BS, ACSM EP-C, Exercise Physiologist;Susanne Bice RN, BSN, CCRP;Carroll Enterkin RN, BSN   ER physicians immediately available to respond to emergencies See telemetry face sheet for immediately available ER MD   Medication changes reported     No   Fall or balance concerns reported    No   Warm-up and Cool-down Performed on first and last piece of equipment   VAD Patient? No   Pain Assessment   Currently in Pain? No/denies         Goals Met:  Proper associated with RPD/PD & O2 Sat Exercise tolerated well No report of cardiac concerns or symptoms Strength training completed today  Goals Unmet:  Not Applicable  Goals Comments:    Dr. Emily Filbert is Medical Director for [place] and [facility].

## 2015-03-17 ENCOUNTER — Encounter: Payer: Medicare Other | Admitting: *Deleted

## 2015-03-17 DIAGNOSIS — Z951 Presence of aortocoronary bypass graft: Secondary | ICD-10-CM | POA: Diagnosis not present

## 2015-03-17 NOTE — Progress Notes (Signed)
Daily Session Note  Patient Details  Name: Bradley [name] MRN: [id number] Date of Birth: Jan 17, 1941 Referring Provider:  Fay Records, MD  Encounter Date: 03/17/2015  Check In:     Session Check In - 03/17/15 1618    Check-In   Staff Present Diane Joya Gaskins RN, BSN;Rashidah [place] Dillard Essex MS, ACSM CEP Exercise Physiologist;Carroll Enterkin RN, BSN   ER physicians immediately available to respond to emergencies See telemetry face sheet for immediately available ER MD   Medication changes reported     No   Fall or balance concerns reported    No   Warm-up and Cool-down Performed on first and last piece of equipment   VAD Patient? No   Pain Assessment   Currently in Pain? No/denies   Multiple Pain Sites No         Goals Met:  Independence with exercise equipment Exercise tolerated well Personal goals reviewed No report of cardiac concerns or symptoms Strength training completed today  Goals Unmet:  Not Applicable  Goals Comments: Reviewed individualized exercise prescription and made increases per departmental policy. Exercise increases were discussed with the patient and they were able to perform the new work loads without issue (no signs or symptoms).     Dr. Emily Filbert is Medical Director for [place] and [facility].

## 2015-03-18 ENCOUNTER — Encounter: Payer: Medicare Other | Attending: Internal Medicine

## 2015-03-18 DIAGNOSIS — Z951 Presence of aortocoronary bypass graft: Secondary | ICD-10-CM | POA: Insufficient documentation

## 2015-03-24 ENCOUNTER — Encounter: Payer: Medicare Other | Admitting: *Deleted

## 2015-03-24 DIAGNOSIS — Z951 Presence of aortocoronary bypass graft: Secondary | ICD-10-CM

## 2015-03-24 NOTE — Progress Notes (Signed)
Daily Session Note  Patient Details  Name: YAQUB ARNEY MRN: [id number] Date of Birth: Aug 14, 1940 Referring Provider:  Fay Records, MD  Encounter Date: 03/24/2015  Check In:     Session Check In - 03/24/15 1650    Check-In   Medication changes reported     Yes           Exercise Prescription Changes - 03/24/15 1600    Exercise Review   Progression Yes   Response to Exercise   Symptoms None   Duration Progress to 30 minutes of continuous aerobic without signs/symptoms of physical distress   Intensity Rest + 30   Progression Continue progressive overload as per policy without signs/symptoms or physical distress.   Resistance Training   Training Prescription Yes   Weight 3   Reps 10-15   Interval Training   Interval Training No   Treadmill   MPH 3   Grade 0   Minutes 15   Recumbant Bike   Level 10   RPM 50   Minutes 15      Goals Met:  Independence with exercise equipment Exercise tolerated well Personal goals reviewed No report of cardiac concerns or symptoms Strength training completed today  Goals Unmet:  Not Applicable  Goals Comments: Reviewed goals today.  Doing well with exercise prescription progression.    Dr. Emily Filbert is Medical Director for [place] and [facility].

## 2015-03-24 NOTE — Progress Notes (Signed)
Daily Session Note  Patient Details  Name: Bradley [name] MRN: [id number] Date of Birth: 04/18/1941 Referring Provider:  Fay Records, MD  Encounter Date: 03/24/2015  Check In:     Session Check In - 03/24/15 1615    Check-In   Staff Present Heath Lark RN, BSN, CCRP;Renee Gray MS, ACSM CEP Exercise Physiologist;Vaniyah Lansky Mariana Arn, BSN   ER physicians immediately available to respond to emergencies See telemetry face sheet for immediately available ER MD   Medication changes reported     No   Fall or balance concerns reported    No   Warm-up and Cool-down Performed on first and last piece of equipment   VAD Patient? No   Pain Assessment   Currently in Pain? No/denies   Multiple Pain Sites No           Exercise Prescription Changes - 03/24/15 1600    Exercise Review   Progression Yes   Response to Exercise   Symptoms None   Duration Progress to 30 minutes of continuous aerobic without signs/symptoms of physical distress   Intensity Rest + 30   Progression Continue progressive overload as per policy without signs/symptoms or physical distress.   Resistance Training   Training Prescription Yes   Weight 3   Reps 10-15   Interval Training   Interval Training No   Treadmill   MPH 3   Grade 0   Minutes 15   Recumbant Bike   Level 10   RPM 50   Minutes 15      Goals Met:  Exercise tolerated well Personal goals reviewed No report of cardiac concerns or symptoms Strength training completed today  Goals Unmet:  Not Applicable  Goals Comments:   Seen by Dr. Nehemiah Massed today.  Dr. Nehemiah Massed stopped coumadin and amiodarone.  Medications updated in [facility].     Dr. Emily Filbert is Medical Director for [place] and [facility].

## 2015-03-25 DIAGNOSIS — Z951 Presence of aortocoronary bypass graft: Secondary | ICD-10-CM

## 2015-03-25 NOTE — Progress Notes (Signed)
Daily Session Note  Patient Details  Name: Bradley [name] MRN: [id number] Date of Birth: Jul 15, 1941 Referring Provider:  Fay Records, MD  Encounter Date: 03/25/2015  Check In:     Session Check In - 03/25/15 1632    Check-In   Staff Present Nyoka Cowden RN;Diane Joya Gaskins RN, BSN;Travoris Bushey BS, ACSM EP-C, Exercise Physiologist   ER physicians immediately available to respond to emergencies See telemetry face sheet for immediately available ER MD   Medication changes reported     No   Fall or balance concerns reported    No   Warm-up and Cool-down Performed on first and last piece of equipment   VAD Patient? No   Pain Assessment   Currently in Pain? No/denies         Goals Met:  Proper associated with RPD/PD & O2 Sat Exercise tolerated well No report of cardiac concerns or symptoms Strength training completed today  Goals Unmet:  Not Applicable  Goals Comments:    Dr. Emily Filbert is Medical Director for [place] and [facility].

## 2015-03-29 ENCOUNTER — Encounter: Payer: Medicare Other | Admitting: *Deleted

## 2015-03-29 ENCOUNTER — Encounter: Payer: Self-pay | Admitting: *Deleted

## 2015-03-29 DIAGNOSIS — Z951 Presence of aortocoronary bypass graft: Secondary | ICD-10-CM | POA: Diagnosis not present

## 2015-03-29 NOTE — Progress Notes (Signed)
Daily Session Note  Patient Details  Name: Bradley [name] MRN: [id number] Date of Birth: [birth year] Referring Provider:  Fay Records, MD  Encounter Date: 03/29/2015  Check In:     Session Check In - 03/29/15 1611    Check-In   Staff Present Gerlene Burdock RN, BSN;Esthefany Herrig RN, BSN, CCRP;Steven Way BS, ACSM EP-C, Exercise Physiologist   ER physicians immediately available to respond to emergencies See telemetry face sheet for immediately available ER MD   Medication changes reported     No   Fall or balance concerns reported    No   Warm-up and Cool-down Performed on first and last piece of equipment   VAD Patient? No   Pain Assessment   Currently in Pain? No/denies           Exercise Prescription Changes - 03/29/15 1600    Exercise Review   Progression Yes   Response to Exercise   Blood Pressure (Admit) 112/64 mmHg   Blood Pressure (Exercise) 160/74 mmHg   Blood Pressure (Exit) 116/66 mmHg   Heart Rate (Admit) 73 bpm   Heart Rate (Exercise) 89 bpm   Heart Rate (Exit) 73 bpm   Rating of Perceived Exertion (Exercise) 13   Symptoms None   Frequency Add 2 additional days to program exercise sessions.   Duration Progress to 30 minutes of continuous aerobic without signs/symptoms of physical distress   Intensity Rest + 30   Progression Continue progressive overload as per policy without signs/symptoms or physical distress.   Resistance Training   Training Prescription Yes   Weight 4   Reps 10-15   Interval Training   Interval Training Yes   Equipment Recumbant Bike   Comments See interval training card   Treadmill   MPH 3   Grade 0   Minutes 15   Recumbant Bike   Level 10   RPM 70   Minutes 15   Home Exercise Plan   Plans to continue exercise at Home  Has a treadmill at home, also has a walking track.      Goals Met:  Independence with exercise equipment Exercise tolerated well No report of cardiac concerns or symptoms Strength training completed  today  Goals Unmet:  Not Applicable  Goals Comments: Doing well with exercise prescription progression. Has started using TM and bike at home for short intervals of time.    Dr. Emily Filbert is Medical Director for [place] and [facility].

## 2015-03-31 ENCOUNTER — Encounter: Payer: Medicare Other | Admitting: *Deleted

## 2015-03-31 ENCOUNTER — Encounter: Payer: Self-pay | Admitting: *Deleted

## 2015-03-31 DIAGNOSIS — Z951 Presence of aortocoronary bypass graft: Secondary | ICD-10-CM

## 2015-03-31 NOTE — Progress Notes (Signed)
Daily Session Note  Patient Details  Name: WILBERN PENNYPACKER MRN: [id number] Date of Birth: 1940-08-23 Referring Provider:  Fay Records, MD  Encounter Date: 03/31/2015  Check In:     Session Check In - 03/31/15 1653    Check-In   Staff Present Nyoka Cowden RN;Zonnique Norkus Dillard Essex MS, ACSM CEP Exercise Physiologist;Carroll Enterkin RN, BSN   ER physicians immediately available to respond to emergencies See telemetry face sheet for immediately available ER MD   Medication changes reported     No   Fall or balance concerns reported    No   Warm-up and Cool-down Performed on first and last piece of equipment   VAD Patient? No   Pain Assessment   Currently in Pain? No/denies   Multiple Pain Sites No         Goals Met:  Independence with exercise equipment Exercise tolerated well Personal goals reviewed No report of cardiac concerns or symptoms Strength training completed today  Goals Unmet:  Not Applicable  Goals Comments: Gaylin increased his high intensity intervals from 30 seconds to one minute and did very well with this.    Dr. Emily Filbert is Medical Director for [place] and [facility].

## 2015-03-31 NOTE — Progress Notes (Signed)
Cardiac Individual Treatment Plan  Patient Details  Name: Bradley [name] MRN: [id number] Date of Birth: 26-Sep-1940 Referring Provider:  No ref. provider found  Initial Encounter Date:    Visit Diagnosis: No diagnosis found.  Patient's Home Medications on Admission:  Current outpatient prescriptions:  .  amiodarone (PACERONE) 200 MG tablet, Take by mouth daily. , Disp: , Rfl:  .  aspirin 81 MG tablet, Take 81 mg by mouth daily., Disp: , Rfl:  .  atorvastatin (LIPITOR) 20 MG tablet, Take 20 mg by mouth daily., Disp: , Rfl:  .  benzocaine (AMERICAINE) 20 % rectal ointment, Place rectally every 3 (three) hours as needed for pain., Disp: 28.4 g, Rfl: 0 .  cetirizine (ZYRTEC) 5 MG tablet, Take 5 mg by mouth daily., Disp: , Rfl:  .  chlorhexidine (PERIDEX) 0.12 % solution, Use as directed 15 mLs in the mouth or throat 2 (two) times daily., Disp: , Rfl:  .  cyanocobalamin 100 MCG tablet, Take 100 mcg by mouth daily., Disp: , Rfl:  .  dutasteride (AVODART) 0.5 MG capsule, Take 0.5 mg by mouth daily., Disp: , Rfl:  .  latanoprost (XALATAN) 0.005 % ophthalmic solution, Place 1 drop into both eyes at bedtime., Disp: , Rfl:  .  omeprazole (PRILOSEC) 20 MG capsule, Take 20 mg by mouth daily., Disp: , Rfl:  .  timolol (BETIMOL) 0.25 % ophthalmic solution, Place 1 drop into both eyes daily., Disp: , Rfl:  .  traMADol (ULTRAM) 50 MG tablet, Take 1-2 tablets (50-100 mg total) by mouth every 4 (four) hours as needed for moderate pain., Disp: 30 tablet, Rfl: 0 .  triamcinolone cream (KENALOG) 0.1 %, Apply 1 application topically 2 (two) times daily as needed (Foot)., Disp: , Rfl:  .  warfarin (COUMADIN) 5 MG tablet, Take 1 tablet (5 mg total) by mouth daily at 6 PM. (Patient not taking: Reported on 03/24/2015), Disp: 30 tablet, Rfl: 3  Past Medical History: Past Medical History  Diagnosis Date  . Coronary artery disease   . Meningioma     stable, followed by yearly MRIs  . GERD (gastroesophageal reflux  disease)   . History of hiatal hernia 2012  . Essential hypertension 06/05/2014  . Hyperlipidemia   . BPH (benign prostatic hyperplasia)   . Cataracts, bilateral   . Hemorrhoids   . Migraines   . RBBB   . Stomach ulcer   . GI bleed     a. ~2012 around time of hiatal hernia surgery. 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    Tobacco Use: History  Smoking status  . Never Smoker   Smokeless tobacco  . Not on file    Labs: Recent Review Flowsheet Data    Labs for ITP Cardiac and Pulmonary Rehab Latest Ref Rng 01/19/2015 01/19/2015 01/19/2015 01/19/2015 01/20/2015   PHART 7.350 - 7.450 7.357 7.309(L) 7.327(L) - -   PCO2ART 35.0 - 45.0 mmHg 40.1 44.4 44.4 - -   HCO3 20.0 - 24.0 mEq/L 22.8 22.3 23.2 - -   TCO2 0 - 100 mmol/L 24 24 25 23 20    ACIDBASEDEF 0.0 - 2.0 mmol/L 3.0(H) 4.0(H) 3.0(H) - -   O2SAT - 95.0 97.0 94.0 - -       Exercise Target Goals:    Exercise Program Goal: Individual exercise prescription set with THRR, safety & activity barriers. Participant demonstrates ability to understand and report RPE using BORG scale, to self-measure pulse accurately, and to acknowledge the importance of the  exercise prescription.  Exercise Prescription Goal: Starting with aerobic activity 30 plus minutes a day, 3 days per week for initial exercise prescription. Provide home exercise prescription and guidelines that participant acknowledges understanding prior to discharge.  Activity Barriers & Risk Stratification:     Activity Barriers & Risk Stratification - 02/16/15 1017    Activity Barriers & Risk Stratification   Activity Barriers None   Risk Stratification High      6 Minute Walk:     6 Minute Walk      02/16/15 1146       6 Minute Walk   Phase Initial     Distance 1365 feet     Walk Time 6 minutes     Resting HR 62 bpm     Resting BP 120/76 mmHg     Max Ex. HR 88 bpm     Max Ex. BP 138/72 mmHg     RPE 13     Symptoms No        Initial  Exercise Prescription:     Initial Exercise Prescription - 02/16/15 1100    Date of Initial Exercise Prescription   Date 02/16/15   Treadmill   MPH 2.5   Grade 0   Minutes 15   Bike   Level 0.4   Minutes 10   Recumbant Bike   Level 3   RPM 45   Watts 25   Minutes 15   NuStep   Level 3   Watts 45   Minutes 15   Arm Ergometer   Level 1   Watts 10   Minutes 10   Arm/Foot Ergometer   Level 4   Watts 15   Minutes 10   Cybex   Level 1   RPM 50   Minutes 10   Recumbant Elliptical   Level 1   RPM 40   Watts 10   Minutes 10   Elliptical   Level 1   Speed 3   Minutes 1   REL-XR   Level 3   Watts 45   Minutes 15   Prescription Details   Frequency (times per week) 3   Duration Progress to 30 minutes of continuous aerobic without signs/symptoms of physical distress   Intensity   THRR REST +  30   Ratings of Perceived Exertion 11-15   Progression Continue progressive overload as per policy without signs/symptoms or physical distress.   Resistance Training   Training Prescription Yes   Weight 2   Reps 10-15      Exercise Prescription Changes:     Exercise Prescription Changes      02/22/15 0700 03/10/15 1800 03/17/15 1600 03/24/15 1600 03/29/15 0700   Exercise Review   Progression No  Has not started program yet Yes Yes Yes Yes   Response to Exercise   Blood Pressure (Admit)     112/64 mmHg   Blood Pressure (Exercise)     160/74 mmHg   Blood Pressure (Exit)     116/66 mmHg   Heart Rate (Admit)     73 bpm   Heart Rate (Exercise)     89 bpm   Heart Rate (Exit)     73 bpm   Rating of Perceived Exertion (Exercise)     13   Symptoms  None None None None   Frequency     Add 2 additional days to program exercise sessions.   Duration  Progress to 30 minutes of continuous aerobic without signs/symptoms  of physical distress Progress to 30 minutes of continuous aerobic without signs/symptoms of physical distress Progress to 30 minutes of continuous aerobic without  signs/symptoms of physical distress Progress to 30 minutes of continuous aerobic without signs/symptoms of physical distress   Intensity  Rest + 30 Rest + 30 Rest + 30 Rest + 30   Progression  Continue progressive overload as per policy without signs/symptoms or physical distress. Continue progressive overload as per policy without signs/symptoms or physical distress. Continue progressive overload as per policy without signs/symptoms or physical distress. Continue progressive overload as per policy without signs/symptoms or physical distress.   Resistance Training   Training Prescription  Yes Yes Yes Yes   Weight  4 4 3 4    Reps  10-15 10-15 10-15 10-15   Interval Training   Interval Training  No No No Yes   Equipment     Recumbant Bike   Comments     See interval training card   Treadmill   MPH  3 3 3 3    Grade  0 0 0 0   Minutes  15 15 15 15    Recumbant Bike   Level  10 12 10 10    RPM  50 50 50 70   Minutes  15 15 15 15      03/29/15 1600           Exercise Review   Progression Yes       Response to Exercise   Blood Pressure (Admit) 112/64 mmHg       Blood Pressure (Exercise) 160/74 mmHg       Blood Pressure (Exit) 116/66 mmHg       Heart Rate (Admit) 73 bpm       Heart Rate (Exercise) 89 bpm       Heart Rate (Exit) 73 bpm       Rating of Perceived Exertion (Exercise) 13       Symptoms None       Frequency Add 2 additional days to program exercise sessions.       Duration Progress to 30 minutes of continuous aerobic without signs/symptoms of physical distress       Intensity Rest + 30       Progression Continue progressive overload as per policy without signs/symptoms or physical distress.       Resistance Training   Training Prescription Yes       Weight 4       Reps 10-15       Interval Training   Interval Training Yes       Equipment Recumbant Bike       Comments See interval training card       Treadmill   MPH 3       Grade 0       Minutes 15       Recumbant  Bike   Level 10       RPM 70       Minutes 15       Home Exercise Plan   Plans to continue exercise at Home  Has a treadmill at home, also has a walking track.          Discharge Exercise Prescription (Final Exercise Prescription Changes):     Exercise Prescription Changes - 03/29/15 1600    Exercise Review   Progression Yes   Response to Exercise   Blood Pressure (Admit) 112/64 mmHg   Blood Pressure (Exercise) 160/74 mmHg   Blood Pressure (  Exit) 116/66 mmHg   Heart Rate (Admit) 73 bpm   Heart Rate (Exercise) 89 bpm   Heart Rate (Exit) 73 bpm   Rating of Perceived Exertion (Exercise) 13   Symptoms None   Frequency Add 2 additional days to program exercise sessions.   Duration Progress to 30 minutes of continuous aerobic without signs/symptoms of physical distress   Intensity Rest + 30   Progression Continue progressive overload as per policy without signs/symptoms or physical distress.   Resistance Training   Training Prescription Yes   Weight 4   Reps 10-15   Interval Training   Interval Training Yes   Equipment Recumbant Bike   Comments See interval training card   Treadmill   MPH 3   Grade 0   Minutes 15   Recumbant Bike   Level 10   RPM 70   Minutes 15   Home Exercise Plan   Plans to continue exercise at Home  Has a treadmill at home, also has a walking track.      Nutrition:  Target Goals: Understanding of nutrition guidelines, daily intake of sodium <1566m, cholesterol <2031m calories 30% from fat and 7% or less from saturated fats, daily to have 5 or more servings of fruits and vegetables.  Biometrics:     Pre Biometrics - 02/16/15 1145    Pre Biometrics   Height 6' 2.5" (1.892 m)   Weight 214 lb 9.6 oz (97.342 kg)   Waist Circumference 40 inches   Hip Circumference 40.5 inches   Waist to Hip Ratio 0.99 %   BMI (Calculated) 27.2       Nutrition Therapy Plan and Nutrition Goals:   Nutrition Discharge: Rate Your Plate  Scores:   Nutrition Goals Re-Evaluation:     Nutrition Goals Re-Evaluation      03/03/15 1155 03/24/15 1801 03/31/15 1136       Personal Goal #1 Re-Evaluation   Personal Goal #1 Eats healthy already.  RD appointment tomorrow Glad to meet with dietician.      Goal Progress Seen Yes  Yes        Psychosocial: Target Goals: Acknowledge presence or absence of depression, maximize coping skills, provide positive support system. Participant is able to verbalize types and ability to use techniques and skills needed for reducing stress and depression.  Initial Review & Psychosocial Screening:     Initial Psych Review & Screening - 02/16/15 1031    Family Dynamics   Good Support System? Yes   Barriers   Psychosocial barriers to participate in program There are no identifiable barriers or psychosocial needs.;The patient should benefit from training in stress management and relaxation.   Screening Interventions   Interventions Encouraged to exercise;Program counselor consult      Quality of Life Scores:     Quality of Life - 02/16/15 1340    Quality of Life Scores   Health/Function Pre 24.7 %   Socioeconomic Pre 27.43 %   Psych/Spiritual Pre 25.5 %   Family Pre 27.6 %   GLOBAL Pre 25.85 %      PHQ-9:     Recent Review Flowsheet Data    [facility]   Decreased Interest 1   Down, Depressed, Hopeless 2   PHQ - 2 Score 3   Altered sleeping 2   Tired, decreased energy 3   Change in appetite 3   Feeling bad or failure about yourself  0   Trouble concentrating 1   Moving slowly  or fidgety/restless 1   Suicidal thoughts 0   PHQ-9 Score 13   Difficult doing work/chores Somewhat difficult      Psychosocial Evaluation and Intervention:     Psychosocial Evaluation - 02/22/15 1726    Psychosocial Evaluation & Interventions   Interventions Stress management education;Relaxation education;Encouraged to exercise with the program and follow exercise  prescription   Comments Counselor met with Mr. Crist today for initial psychosocial evaluation.  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  He has a strong support system with a spouse of 48 years and is actively involved in his local church.  Mr. Sparacino denies a history of depression or anxiety and has no current symptoms.  He reports having decreased appetite since the surgery and has lost approximately 20 pounds.  He is having difficulty sleeping and intermittently wakes up.  Counselor mentioned speaking with his pharmacist about a natural OTC sustained release sleep aid to help with this and provided information.  Mr. Amirault reports he is typically in a positive mood and has goals to get back to normal lifting and driving in the near future.  He plans to use his home gym equipment and walk for follow up maintenance following this program.   Continued Psychosocial Services Needed Yes  Mr. Reifsteck will benefit from the psychoeducational components of this program and needs to speak with his pharmacist or MD about a natural OTC sleep aid due to difficulty sleeping at this time.       Psychosocial Re-Evaluation:   Vocational Rehabilitation: Provide vocational rehab assistance to qualifying candidates.   Vocational Rehab Evaluation & Intervention:     Vocational Rehab - 02/16/15 1019    Initial Vocational Rehab Evaluation & Intervention   Assessment shows need for Vocational Rehabilitation No      Education: Education Goals: Education classes will be provided on a weekly basis, covering required topics. Participant will state understanding/return demonstration of topics presented.  Learning Barriers/Preferences:     Learning Barriers/Preferences - 02/16/15 1017    Learning Barriers/Preferences   Learning Barriers Hearing   Learning Preferences None      Education Topics: General Nutrition Guidelines/Fats and Fiber: -Group instruction provided by verbal, written  material, models and posters to present the general guidelines for heart healthy nutrition. Gives an explanation and review of dietary fats and fiber.          Cardiac Rehab from 03/29/2015 in [facility] Cardiac Rehab   Date  03/01/15   Educator  PI   Instruction Review Code  2- meets goals/outcomes      Controlling Sodium/Reading Food Labels: -Group verbal and written material supporting the discussion of sodium use in heart healthy nutrition. Review and explanation with models, verbal and written materials for utilization of the food label.      Cardiac Rehab from 03/29/2015 in [facility] Cardiac Rehab   Date  03/08/15   Educator  PI   Instruction Review Code  2- meets goals/outcomes      Exercise Physiology & Risk Factors: - Group verbal and written instruction with models to review the exercise physiology of the cardiovascular system and associated critical values. Details cardiovascular disease risk factors and the goals associated with each risk factor.      Cardiac Rehab from 03/29/2015 in [facility] Cardiac Rehab   Date  03/24/15   Educator  RM   Instruction Review Code  2- meets goals/outcomes      Aerobic Exercise & Resistance Training: -  Gives group verbal and written discussion on the health impact of inactivity. On the components of aerobic and resistive training programs and the benefits of this training and how to safely progress through these programs.      Cardiac Rehab from 03/29/2015 in [facility] Cardiac Rehab   Date  03/29/15   Educator  SW   Instruction Review Code  2- meets goals/outcomes      Flexibility, Balance, General Exercise Guidelines: - Provides group verbal and written instruction on the benefits of flexibility and balance training programs. Provides general exercise guidelines with specific guidelines to those with heart or lung disease. Demonstration and skill practice provided.   Stress Management: - Provides group verbal and written instruction about the health  risks of elevated stress, cause of high stress, and healthy ways to reduce stress.   Depression: - Provides group verbal and written instruction on the correlation between heart/lung disease and depressed mood, treatment options, and the stigmas associated with seeking treatment.      Cardiac Rehab from 03/29/2015 in [facility] Cardiac Rehab   Date  03/10/15   Educator  [facility]   Instruction Review Code  2- meets goals/outcomes      Anatomy & Physiology of the Heart: - Group verbal and written instruction and models provide basic cardiac anatomy and physiology, with the coronary electrical and arterial systems. Review of: AMI, Angina, Valve disease, Heart Failure, Cardiac Arrhythmia, Pacemakers, and the ICD.   Cardiac Procedures: - Group verbal and written instruction and models to describe the testing methods done to diagnose heart disease. Reviews the outcomes of the test results. Describes the treatment choices: Medical Management, Angioplasty, or Coronary Bypass Surgery.      Cardiac Rehab from 03/29/2015 in [facility] Cardiac Rehab   Date  03/15/15   Educator  SB   Instruction Review Code  2- meets goals/outcomes      Cardiac Medications: - Group verbal and written instruction to review commonly prescribed medications for heart disease. Reviews the medication, class of the drug, and side effects. Includes the steps to properly store meds and maintain the prescription regimen.      Cardiac Rehab from 03/29/2015 in [facility] Cardiac Rehab   Date  03/17/15   Educator  DW   Instruction Review Code  2- meets goals/outcomes      Go Sex-Intimacy & Heart Disease, Get SMART - Goal Setting: - Group verbal and written instruction through game format to discuss heart disease and the return to sexual intimacy. Provides group verbal and written material to discuss and apply goal setting through the application of the S.M.A.R.T. Method.      Cardiac Rehab from 03/29/2015 in [facility] Cardiac Rehab   Date  03/15/15    Educator  SB   Instruction Review Code  2- meets goals/outcomes      Other Matters of the Heart: - Provides group verbal, written materials and models to describe Heart Failure, Angina, Valve Disease, and Diabetes in the realm of heart disease. Includes description of the disease process and treatment options available to the cardiac patient.      Cardiac Rehab from 03/29/2015 in [facility] Cardiac Rehab   Date  02/24/15   Educator  DW   Instruction Review Code  2- meets goals/outcomes      Exercise & Equipment Safety: - Individual verbal instruction and demonstration of equipment use and safety with use of the equipment.      Cardiac Rehab from 03/29/2015 in [facility] Cardiac Rehab   Date  02/16/15   Educator  SB   Instruction Review Code  2- meets goals/outcomes      Infection Prevention: - Provides verbal and written material to individual with discussion of infection control including proper hand washing and proper equipment cleaning during exercise session.      Cardiac Rehab from 03/29/2015 in [facility] Cardiac Rehab   Date  02/16/15   Educator  SB   Instruction Review Code  2- meets goals/outcomes      Falls Prevention: - Provides verbal and written material to individual with discussion of falls prevention and safety.      Cardiac Rehab from 03/29/2015 in [facility] Cardiac Rehab   Date  02/16/15   Educator  SB   Instruction Review Code  2- meets goals/outcomes      Diabetes: - Individual verbal and written instruction to review signs/symptoms of diabetes, desired ranges of glucose level fasting, after meals and with exercise. Advice that pre and post exercise glucose checks will be done for 3 sessions at entry of program.    Knowledge Questionnaire Score:     Knowledge Questionnaire Score - 02/16/15 1018    Knowledge Questionnaire Score   Pre Score 23/28      Personal Goals and Risk Factors at Admission:     Personal Goals and Risk Factors at Admission - 02/16/15 1335     Personal Goals and Risk Factors on Admission   Hypertension Yes   Goal Participant will see blood pressure controlled within the values of 140/47m/Hg or within value directed by their physician.   Intervention Provide nutrition & aerobic exercise along with prescribed medications to achieve BP 140/90 or less.      Personal Goals and Risk Factors Review:      Goals and Risk Factor Review      03/03/15 1155 03/24/15 1801 03/31/15 1137       Weight Management   Goals Progress/Improvement seen  No      Comments  No changes reported. Has appointment with RD tomorrow. Just had medication change, Coumadin stopped. Hopopes this will give him more freedom to eat the vegetables/fruits he desires and could not will on Coumadin.      Increase Aerobic Exercise and Physical Activity   Goals Progress/Improvement seen  Yes Yes Yes     Comments  Working intervals on machines. States He feels better after his exercise sessions.  Still feels his legs are weak and hopes to see improvement in his leg strength as he continues with his exercise program. WAnts to work on his legs more [place]      Abnormal Lipids   Progress seen towards goals  Unknown      Comments  No labs to compare.  Will see RD tomorrow to review nutrition goals.         Personal Goals Discharge:     Comments: Is working on his leg strength but overall LHusamfeels better.

## 2015-04-01 ENCOUNTER — Encounter (HOSPITAL_COMMUNITY): Payer: Self-pay

## 2015-04-01 DIAGNOSIS — Z951 Presence of aortocoronary bypass graft: Secondary | ICD-10-CM

## 2015-04-01 NOTE — Progress Notes (Signed)
Daily Session Note  Patient Details  Name: Bradley [name] MRN: [id number] Date of Birth: 1941/07/09 Referring Provider:  Fay Records, MD  Encounter Date: 04/01/2015  Check In:     Session Check In - 04/01/15 1630    Check-In   Staff Present Lestine Box BS, ACSM EP-C, Exercise Physiologist;Carroll Enterkin RN, BSN;Mary Kellie Shropshire RN   ER physicians immediately available to respond to emergencies See telemetry face sheet for immediately available ER MD   Medication changes reported     No   Fall or balance concerns reported    No   Warm-up and Cool-down Performed on first and last piece of equipment   VAD Patient? No   Pain Assessment   Currently in Pain? No/denies         Goals Met:  Proper associated with RPD/PD & O2 Sat Exercise tolerated well No report of cardiac concerns or symptoms Strength training completed today  Goals Unmet:  Not Applicable  Goals Comments:    Dr. Emily Filbert is Medical Director for [place] and [facility].

## 2015-04-05 ENCOUNTER — Encounter: Payer: Medicare Other | Admitting: *Deleted

## 2015-04-05 DIAGNOSIS — Z951 Presence of aortocoronary bypass graft: Secondary | ICD-10-CM | POA: Diagnosis not present

## 2015-04-05 NOTE — Progress Notes (Signed)
Daily Session Note  Patient Details  Name: Bradley [name] MRN: [id number] Date of Birth: [birth year] Referring Provider:  Fay Records, MD  Encounter Date: 04/05/2015  Check In:     Session Check In - 04/05/15 1737    Check-In   Staff Present Heath Lark RN, BSN, CCRP;Kelly Hayes BS, ACSM CEP Exercise Physiologist;Steven Way BS, ACSM EP-C, Exercise Physiologist   ER physicians immediately available to respond to emergencies See telemetry face sheet for immediately available ER MD   Medication changes reported     No   Fall or balance concerns reported    No   Warm-up and Cool-down Performed on first and last piece of equipment   VAD Patient? No   Pain Assessment   Currently in Pain? No/denies   Multiple Pain Sites No         Goals Met:  Independence with exercise equipment Exercise tolerated well No report of cardiac concerns or symptoms Strength training completed today  Goals Unmet:  Not Applicable  Goals Comments:    Dr. Emily Filbert is Medical Director for [place] and [facility].

## 2015-04-07 ENCOUNTER — Encounter: Payer: Medicare Other | Admitting: *Deleted

## 2015-04-07 DIAGNOSIS — Z951 Presence of aortocoronary bypass graft: Secondary | ICD-10-CM

## 2015-04-07 NOTE — Progress Notes (Signed)
Daily Session Note  Patient Details  Name: Bradley [name] MRN: [id number] Date of Birth: Mar 19, 1941 Referring Kaytlin Burklow:  Fay Records, MD  Encounter Date: 04/07/2015  Check In:     Session Check In - 04/07/15 1617    Check-In   Staff Present Gerlene Burdock RN, BSN;Renee Dillard Essex MS, ACSM CEP Exercise Physiologist;Diane Mariana Arn, BSN   ER physicians immediately available to respond to emergencies See telemetry face sheet for immediately available ER MD   Medication changes reported     No   Fall or balance concerns reported    No   Warm-up and Cool-down Performed on first and last piece of equipment   VAD Patient? No   Pain Assessment   Currently in Pain? No/denies   Multiple Pain Sites No         Goals Met:  Exercise tolerated well No report of cardiac concerns or symptoms Strength training completed today  Goals Unmet:  Not Applicable  Goals Comments:    Dr. Emily Filbert is Medical Director for [place] and [facility].

## 2015-04-08 DIAGNOSIS — Z951 Presence of aortocoronary bypass graft: Secondary | ICD-10-CM

## 2015-04-08 NOTE — Progress Notes (Signed)
Cardiac Individual Treatment Plan  Patient Details  Name: Bradley [name] MRN: [id number] Date of Birth: 1941-03-17 Referring Amaurie Wandel:  Fay Records, MD  Initial Encounter Date:    Visit Diagnosis: S/P CABG x 4  Patient's Home Medications on Admission:  Current outpatient prescriptions:  .  amiodarone (PACERONE) 200 MG tablet, Take by mouth daily. , Disp: , Rfl:  .  aspirin 81 MG tablet, Take 81 mg by mouth daily., Disp: , Rfl:  .  atorvastatin (LIPITOR) 20 MG tablet, Take 20 mg by mouth daily., Disp: , Rfl:  .  benzocaine (AMERICAINE) 20 % rectal ointment, Place rectally every 3 (three) hours as needed for pain., Disp: 28.4 g, Rfl: 0 .  cetirizine (ZYRTEC) 5 MG tablet, Take 5 mg by mouth daily., Disp: , Rfl:  .  chlorhexidine (PERIDEX) 0.12 % solution, Use as directed 15 mLs in the mouth or throat 2 (two) times daily., Disp: , Rfl:  .  cyanocobalamin 100 MCG tablet, Take 100 mcg by mouth daily., Disp: , Rfl:  .  dutasteride (AVODART) 0.5 MG capsule, Take 0.5 mg by mouth daily., Disp: , Rfl:  .  latanoprost (XALATAN) 0.005 % ophthalmic solution, Place 1 drop into both eyes at bedtime., Disp: , Rfl:  .  omeprazole (PRILOSEC) 20 MG capsule, Take 20 mg by mouth daily., Disp: , Rfl:  .  timolol (BETIMOL) 0.25 % ophthalmic solution, Place 1 drop into both eyes daily., Disp: , Rfl:  .  traMADol (ULTRAM) 50 MG tablet, Take 1-2 tablets (50-100 mg total) by mouth every 4 (four) hours as needed for moderate pain., Disp: 30 tablet, Rfl: 0 .  triamcinolone cream (KENALOG) 0.1 %, Apply 1 application topically 2 (two) times daily as needed (Foot)., Disp: , Rfl:  .  warfarin (COUMADIN) 5 MG tablet, Take 1 tablet (5 mg total) by mouth daily at 6 PM. (Patient not taking: Reported on 03/24/2015), Disp: 30 tablet, Rfl: 3  Past Medical History: Past Medical History  Diagnosis Date  . Coronary artery disease   . Meningioma     stable, followed by yearly MRIs  . GERD (gastroesophageal reflux disease)   .  History of hiatal hernia 2012  . Essential hypertension 06/05/2014  . Hyperlipidemia   . BPH (benign prostatic hyperplasia)   . Cataracts, bilateral   . Hemorrhoids   . Migraines   . RBBB   . Stomach ulcer   . GI bleed     a. ~2012 around time of hiatal hernia surgery. 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    Tobacco Use: History  Smoking status  . Never Smoker   Smokeless tobacco  . Not on file    Labs: Recent Review Flowsheet Data    Labs for ITP Cardiac and Pulmonary Rehab Latest Ref Rng 01/19/2015 01/19/2015 01/19/2015 01/19/2015 01/20/2015   PHART 7.350 - 7.450 7.357 7.309(L) 7.327(L) - -   PCO2ART 35.0 - 45.0 mmHg 40.1 44.4 44.4 - -   HCO3 20.0 - 24.0 mEq/L 22.8 22.3 23.2 - -   TCO2 0 - 100 mmol/L _0 ACIDBASEDEF 0.0 - 2.0 mmol/L 3.0(H) 4.0(H) 3.0(H) - -   O2SAT - 95.0 97.0 94.0 - -       Exercise Target Goals:    Exercise Program Goal: Individual exercise prescription set with THRR, safety & activity barriers. Participant demonstrates ability to understand and report RPE using BORG scale, to self-measure pulse accurately, and to acknowledge the importance of  the exercise prescription.  Exercise Prescription Goal: Starting with aerobic activity 30 plus minutes a day, 3 days per week for initial exercise prescription. Provide home exercise prescription and guidelines that participant acknowledges understanding prior to discharge.  Activity Barriers & Risk Stratification:     Activity Barriers & Risk Stratification - 02/16/15 1017    Activity Barriers & Risk Stratification   Activity Barriers None   Risk Stratification High      6 Minute Walk:     6 Minute Walk      02/16/15 1146       6 Minute Walk   Phase Initial     Distance 1365 feet     Walk Time 6 minutes     Resting HR 62 bpm     Resting BP 120/76 mmHg     Max Ex. HR 88 bpm     Max Ex. BP 138/72 mmHg     RPE 13     Symptoms No        Initial Exercise  Prescription:     Initial Exercise Prescription - 02/16/15 1100    Date of Initial Exercise Prescription   Date 02/16/15   Treadmill   MPH 2.5   Grade 0   Minutes 15   Bike   Level 0.4   Minutes 10   Recumbant Bike   Level 3   RPM 45   Watts 25   Minutes 15   NuStep   Level 3   Watts 45   Minutes 15   Arm Ergometer   Level 1   Watts 10   Minutes 10   Arm/Foot Ergometer   Level 4   Watts 15   Minutes 10   Cybex   Level 1   RPM 50   Minutes 10   Recumbant Elliptical   Level 1   RPM 40   Watts 10   Minutes 10   Elliptical   Level 1   Speed 3   Minutes 1   REL-XR   Level 3   Watts 45   Minutes 15   Prescription Details   Frequency (times per week) 3   Duration Progress to 30 minutes of continuous aerobic without signs/symptoms of physical distress   Intensity   THRR REST +  30   Ratings of Perceived Exertion 11-15   Progression Continue progressive overload as per policy without signs/symptoms or physical distress.   Resistance Training   Training Prescription Yes   Weight 2   Reps 10-15      Exercise Prescription Changes:     Exercise Prescription Changes      02/22/15 0700 03/10/15 1800 03/17/15 1600 03/24/15 1600 03/29/15 0700   Exercise Review   Progression No  Has not started program yet Yes Yes Yes Yes   Response to Exercise   Blood Pressure (Admit)     112/64 mmHg   Blood Pressure (Exercise)     160/74 mmHg   Blood Pressure (Exit)     116/66 mmHg   Heart Rate (Admit)     73 bpm   Heart Rate (Exercise)     89 bpm   Heart Rate (Exit)     73 bpm   Rating of Perceived Exertion (Exercise)     13   Symptoms  None None None None   Frequency     Add 2 additional days to program exercise sessions.   Duration  Progress to 30 minutes of continuous aerobic without  signs/symptoms of physical distress Progress to 30 minutes of continuous aerobic without signs/symptoms of physical distress Progress to 30 minutes of continuous aerobic without  signs/symptoms of physical distress Progress to 30 minutes of continuous aerobic without signs/symptoms of physical distress   Intensity  Rest + 30 Rest + 30 Rest + 30 Rest + 30   Progression  Continue progressive overload as per policy without signs/symptoms or physical distress. Continue progressive overload as per policy without signs/symptoms or physical distress. Continue progressive overload as per policy without signs/symptoms or physical distress. Continue progressive overload as per policy without signs/symptoms or physical distress.   Resistance Training   Training Prescription  Yes Yes Yes Yes   Weight  _0 Reps  10-15 10-15 10-15 10-15   Interval Training   Interval Training  No No No Yes   Equipment     Recumbant Bike   Comments     See interval training card   Treadmill   MPH  _1 Grade  0 0 0 0   Minutes  _2 Recumbant Bike   Level  _3 RPM  50 50 50 70   Minutes  _4 03/29/15 1600           Exercise Review   Progression Yes       Response to Exercise   Blood Pressure (Admit) 112/64 mmHg       Blood Pressure (Exercise) 160/74 mmHg       Blood Pressure (Exit) 116/66 mmHg       Heart Rate (Admit) 73 bpm       Heart Rate (Exercise) 89 bpm       Heart Rate (Exit) 73 bpm       Rating of Perceived Exertion (Exercise) 13       Symptoms None       Frequency Add 2 additional days to program exercise sessions.       Duration Progress to 30 minutes of continuous aerobic without signs/symptoms of physical distress       Intensity Rest + 30       Progression Continue progressive overload as per policy without signs/symptoms or physical distress.       Resistance Training   Training Prescription Yes       Weight 4       Reps 10-15       Interval Training   Interval Training Yes       Equipment Recumbant Bike       Comments See interval training card       Treadmill   MPH 3       Grade 0       Minutes 15       Recumbant  Bike   Level 10       RPM 70       Minutes 15       Home Exercise Plan   Plans to continue exercise at Home  Has a treadmill at home, also has a walking track.          Discharge Exercise Prescription (Final Exercise Prescription Changes):     Exercise Prescription Changes - 03/29/15 1600    Exercise Review   Progression Yes   Response to Exercise   Blood Pressure (Admit) 112/64 mmHg   Blood Pressure (Exercise) 160/74 mmHg   Blood  Pressure (Exit) 116/66 mmHg   Heart Rate (Admit) 73 bpm   Heart Rate (Exercise) 89 bpm   Heart Rate (Exit) 73 bpm   Rating of Perceived Exertion (Exercise) 13   Symptoms None   Frequency Add 2 additional days to program exercise sessions.   Duration Progress to 30 minutes of continuous aerobic without signs/symptoms of physical distress   Intensity Rest + 30   Progression Continue progressive overload as per policy without signs/symptoms or physical distress.   Resistance Training   Training Prescription Yes   Weight 4   Reps 10-15   Interval Training   Interval Training Yes   Equipment Recumbant Bike   Comments See interval training card   Treadmill   MPH 3   Grade 0   Minutes 15   Recumbant Bike   Level 10   RPM 70   Minutes 15   Home Exercise Plan   Plans to continue exercise at Home  Has a treadmill at home, also has a walking track.      Nutrition:  Target Goals: Understanding of nutrition guidelines, daily intake of sodium <1524m, cholesterol <20105m calories 30% from fat and 7% or less from saturated fats, daily to have 5 or more servings of fruits and vegetables.  Biometrics:     Pre Biometrics - 02/16/15 1145    Pre Biometrics   Height 6' 2.5" (1.892 m)   Weight 214 lb 9.6 oz (97.342 kg)   Waist Circumference 40 inches   Hip Circumference 40.5 inches   Waist to Hip Ratio 0.99 %   BMI (Calculated) 27.2       Nutrition Therapy Plan and Nutrition Goals:     Nutrition Therapy & Goals - 04/02/15 0856    Nutrition  Therapy   Diet DASH   Drug/Food Interactions Statins/Certain Fruits  patient states he is no longer taking coumadin   Fiber 30 grams   Whole Grain Foods 3 servings   Protein 8 ounces/day   Saturated Fats 14 max. grams   Fruits and Vegetables 8 servings/day   Personal Nutrition Goals   Personal Goal #1 choose low fat cheese such as 2%, use small portions of cheese and cut thin slices from block.   Personal Goal #2 Eat 1 snack every 3-4 hours, allow at least 2 hours between snacks or snacks and meals.       Nutrition Discharge: Rate Your Plate Scores:     Rate Your Plate - 0930/16/0170932  Rate Your Plate Scores   Post Score 71   Post Score % 79 %      Nutrition Goals Re-Evaluation:     Nutrition Goals Re-Evaluation      03/03/15 1155 03/24/15 1801 03/31/15 1136       Personal Goal #1 Re-Evaluation   Personal Goal #1 Eats healthy already.  RD appointment tomorrow Glad to meet with dietician.      Goal Progress Seen Yes  Yes        Psychosocial: Target Goals: Acknowledge presence or absence of depression, maximize coping skills, provide positive support system. Participant is able to verbalize types and ability to use techniques and skills needed for reducing stress and depression.  Initial Review & Psychosocial Screening:     Initial Psych Review & Screening - 02/16/15 1031    Family Dynamics   Good Support System? Yes   Barriers   Psychosocial barriers to participate in program There are no identifiable barriers or psychosocial needs.;The patient  should benefit from training in stress management and relaxation.   Screening Interventions   Interventions Encouraged to exercise;Program counselor consult      Quality of Life Scores:     Quality of Life - 04/08/15 1801    Quality of Life Scores   Health/Function Post 27.2 %   Health/Function % Change 10 %   Socioeconomic Post 29.14 %   Socioeconomic % Change 6 %   Psych/Spiritual Post 29.14 %    Psych/Spiritual % Change 14 %   Family Post 28.8 %   Family % Change 4 %   GLOBAL Post 28.24 %   GLOBAL % Change 9 %      PHQ-9:     Recent Review Flowsheet Data    Depression screen [facility] 2/9 04/08/2015 02/16/2015   Decreased Interest 0 1   Down, Depressed, Hopeless 0 2   PHQ - 2 Score 0 3   Altered sleeping 2 2   Tired, decreased energy 2 3   Change in appetite 0 3   Feeling bad or failure about yourself  0 0   Trouble concentrating 0 1   Moving slowly or fidgety/restless 0 1   Suicidal thoughts 0 0   PHQ-9 Score 4 13   Difficult doing work/chores Not difficult at all Somewhat difficult      Psychosocial Evaluation and Intervention:     Psychosocial Evaluation - 02/22/15 1726    Psychosocial Evaluation & Interventions   Interventions Stress management education;Relaxation education;Encouraged to exercise with the program and follow exercise prescription   Comments Counselor met with Mr. Duerson today for initial psychosocial evaluation.  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  He has a strong support system with a spouse of 39 years and is actively involved in his local church.  Mr. Bognar denies a history of depression or anxiety and has no current symptoms.  He reports having decreased appetite since the surgery and has lost approximately 20 pounds.  He is having difficulty sleeping and intermittently wakes up.  Counselor mentioned speaking with his pharmacist about a natural OTC sustained release sleep aid to help with this and provided information.  Mr. Kardell reports he is typically in a positive mood and has goals to get back to normal lifting and driving in the near future.  He plans to use his home gym equipment and walk for follow up maintenance following this program.   Continued Psychosocial Services Needed Yes  Mr. Peddy will benefit from the psychoeducational components of this program and needs to speak with his pharmacist or MD about a natural OTC sleep  aid due to difficulty sleeping at this time.       Psychosocial Re-Evaluation:   Vocational Rehabilitation: Provide vocational rehab assistance to qualifying candidates.   Vocational Rehab Evaluation & Intervention:     Vocational Rehab - 02/16/15 1019    Initial Vocational Rehab Evaluation & Intervention   Assessment shows need for Vocational Rehabilitation No      Education: Education Goals: Education classes will be provided on a weekly basis, covering required topics. Participant will state understanding/return demonstration of topics presented.  Learning Barriers/Preferences:     Learning Barriers/Preferences - 02/16/15 1017    Learning Barriers/Preferences   Learning Barriers Hearing   Learning Preferences None      Education Topics: General Nutrition Guidelines/Fats and Fiber: -Group instruction provided by verbal, written material, models and posters to present the general guidelines for heart healthy  nutrition. Gives an explanation and review of dietary fats and fiber.          Cardiac Rehab from 04/07/2015 in [facility] Cardiac Rehab   Date  03/01/15   Educator  PI   Instruction Review Code  2- meets goals/outcomes      Controlling Sodium/Reading Food Labels: -Group verbal and written material supporting the discussion of sodium use in heart healthy nutrition. Review and explanation with models, verbal and written materials for utilization of the food label.      Cardiac Rehab from 04/07/2015 in [facility] Cardiac Rehab   Date  03/08/15   Educator  PI   Instruction Review Code  2- meets goals/outcomes      Exercise Physiology & Risk Factors: - Group verbal and written instruction with models to review the exercise physiology of the cardiovascular system and associated critical values. Details cardiovascular disease risk factors and the goals associated with each risk factor.      Cardiac Rehab from 04/07/2015 in [facility] Cardiac Rehab   Date  03/24/15   Educator  RM    Instruction Review Code  2- meets goals/outcomes      Aerobic Exercise & Resistance Training: - Gives group verbal and written discussion on the health impact of inactivity. On the components of aerobic and resistive training programs and the benefits of this training and how to safely progress through these programs.      Cardiac Rehab from 04/07/2015 in [facility] Cardiac Rehab   Date  03/29/15   Educator  SW   Instruction Review Code  2- meets goals/outcomes      Flexibility, Balance, General Exercise Guidelines: - Provides group verbal and written instruction on the benefits of flexibility and balance training programs. Provides general exercise guidelines with specific guidelines to those with heart or lung disease. Demonstration and skill practice provided.      Cardiac Rehab from 04/07/2015 in [facility] Cardiac Rehab   Date  04/05/15   Educator  SW   Instruction Review Code  2- meets goals/outcomes      Stress Management: - Provides group verbal and written instruction about the health risks of elevated stress, cause of high stress, and healthy ways to reduce stress.      Cardiac Rehab from 04/07/2015 in [facility] Cardiac Rehab   Date  04/07/15   Educator  Kathreen Cornfield   Instruction Review Code  2- meets goals/outcomes      Depression: - Provides group verbal and written instruction on the correlation between heart/lung disease and depressed mood, treatment options, and the stigmas associated with seeking treatment.      Cardiac Rehab from 04/07/2015 in [facility] Cardiac Rehab   Date  03/10/15   Educator  [facility]   Instruction Review Code  2- meets goals/outcomes      Anatomy & Physiology of the Heart: - Group verbal and written instruction and models provide basic cardiac anatomy and physiology, with the coronary electrical and arterial systems. Review of: AMI, Angina, Valve disease, Heart Failure, Cardiac Arrhythmia, Pacemakers, and the ICD.   Cardiac Procedures: - Group verbal and written  instruction and models to describe the testing methods done to diagnose heart disease. Reviews the outcomes of the test results. Describes the treatment choices: Medical Management, Angioplasty, or Coronary Bypass Surgery.      Cardiac Rehab from 04/07/2015 in [facility] Cardiac Rehab   Date  03/15/15   Educator  SB   Instruction Review Code  2- meets goals/outcomes  Cardiac Medications: - Group verbal and written instruction to review commonly prescribed medications for heart disease. Reviews the medication, class of the drug, and side effects. Includes the steps to properly store meds and maintain the prescription regimen.      Cardiac Rehab from 04/07/2015 in [facility] Cardiac Rehab   Date  03/17/15   Educator  DW   Instruction Review Code  2- meets goals/outcomes      Go Sex-Intimacy & Heart Disease, Get SMART - Goal Setting: - Group verbal and written instruction through game format to discuss heart disease and the return to sexual intimacy. Provides group verbal and written material to discuss and apply goal setting through the application of the S.M.A.R.T. Method.      Cardiac Rehab from 04/07/2015 in [facility] Cardiac Rehab   Date  03/15/15   Educator  SB   Instruction Review Code  2- meets goals/outcomes      Other Matters of the Heart: - Provides group verbal, written materials and models to describe Heart Failure, Angina, Valve Disease, and Diabetes in the realm of heart disease. Includes description of the disease process and treatment options available to the cardiac patient.      Cardiac Rehab from 04/07/2015 in [facility] Cardiac Rehab   Date  02/24/15   Educator  DW   Instruction Review Code  2- meets goals/outcomes      Exercise & Equipment Safety: - Individual verbal instruction and demonstration of equipment use and safety with use of the equipment.      Cardiac Rehab from 04/07/2015 in [facility] Cardiac Rehab   Date  02/16/15   Educator  SB   Instruction Review Code  2- meets  goals/outcomes      Infection Prevention: - Provides verbal and written material to individual with discussion of infection control including proper hand washing and proper equipment cleaning during exercise session.      Cardiac Rehab from 04/07/2015 in [facility] Cardiac Rehab   Date  02/16/15   Educator  SB   Instruction Review Code  2- meets goals/outcomes      Falls Prevention: - Provides verbal and written material to individual with discussion of falls prevention and safety.      Cardiac Rehab from 04/07/2015 in [facility] Cardiac Rehab   Date  02/16/15   Educator  SB   Instruction Review Code  2- meets goals/outcomes      Diabetes: - Individual verbal and written instruction to review signs/symptoms of diabetes, desired ranges of glucose level fasting, after meals and with exercise. Advice that pre and post exercise glucose checks will be done for 3 sessions at entry of program.    Knowledge Questionnaire Score:     Knowledge Questionnaire Score - 04/08/15 1726    Knowledge Questionnaire Score   Post Score 25      Personal Goals and Risk Factors at Admission:     Personal Goals and Risk Factors at Admission - 02/16/15 1335    Personal Goals and Risk Factors on Admission   Hypertension Yes   Goal Participant will see blood pressure controlled within the values of 140/47m/Hg or within value directed by their physician.   Intervention Provide nutrition & aerobic exercise along with prescribed medications to achieve BP 140/90 or less.      Personal Goals and Risk Factors Review:      Goals and Risk Factor Review      03/03/15 1155 03/24/15 1801 03/31/15 1137       Weight  Management   Goals Progress/Improvement seen  No      Comments  No changes reported. Has appointment with RD tomorrow. Just had medication change, Coumadin stopped. Hopopes this will give him more freedom to eat the vegetables/fruits he desires and could not will on Coumadin.      Increase Aerobic  Exercise and Physical Activity   Goals Progress/Improvement seen  Yes Yes Yes     Comments  Working intervals on machines. States He feels better after his exercise sessions.  Still feels his legs are weak and hopes to see improvement in his leg strength as he continues with his exercise program. WAnts to work on his legs more and does the [place] for that.      Abnormal Lipids   Progress seen towards goals  Unknown      Comments  No labs to compare.  Will see RD tomorrow to review nutrition goals.         Personal Goals Discharge (Final Personal Goals and Risk Factors Review):      Goals and Risk Factor Review - 03/31/15 1137    Increase Aerobic Exercise and Physical Activity   Goals Progress/Improvement seen  Yes   Comments WAnts to work on his legs more and does the [place] for that.        Comments: Almost ready to finish Cardiac Rehab.

## 2015-04-08 NOTE — Progress Notes (Signed)
Daily Session Note  Patient Details  Name: Bradley [name] MRN: [id number] Date of Birth: 1941-02-03 Referring Provider:  Fay Records, MD  Encounter Date: 04/08/2015  Check In:     Session Check In - 04/08/15 1628    Check-In   Staff Present Lestine Box BS, ACSM EP-C, Exercise Physiologist;Carroll Enterkin RN, BSN;Diane Joya Gaskins RN, BSN   ER physicians immediately available to respond to emergencies See telemetry face sheet for immediately available ER MD   Medication changes reported     No   Fall or balance concerns reported    No   Warm-up and Cool-down Performed on first and last piece of equipment   VAD Patient? No   Pain Assessment   Currently in Pain? No/denies         Goals Met:  Proper associated with RPD/PD & O2 Sat Exercise tolerated well No report of cardiac concerns or symptoms Strength training completed today  Goals Unmet:  Not Applicable  Goals Comments:   Dr. Emily Filbert is Medical Director for [facility].

## 2015-04-19 ENCOUNTER — Encounter: Payer: Medicare Other | Attending: Internal Medicine

## 2015-04-19 DIAGNOSIS — Z951 Presence of aortocoronary bypass graft: Secondary | ICD-10-CM | POA: Insufficient documentation

## 2015-04-19 NOTE — Progress Notes (Signed)
Daily Session Note  Patient Details  Name: CATON POPOWSKI MRN: [id number] Date of Birth: 21-Aug-1940 Referring Provider:  Fay Records, MD  Encounter Date: 04/19/2015  Check In:     Session Check In - 04/19/15 1757    Check-In   Staff Present Lestine Box BS, ACSM EP-C, Exercise Physiologist;Carroll Enterkin RN, BSN;Mary Kellie Shropshire RN   ER physicians immediately available to respond to emergencies See telemetry face sheet for immediately available ER MD   Medication changes reported     No   Fall or balance concerns reported    No   Warm-up and Cool-down Performed on first and last piece of equipment   VAD Patient? No   Pain Assessment   Currently in Pain? No/denies         Goals Met:  Proper associated with RPD/PD & O2 Sat Exercise tolerated well No report of cardiac concerns or symptoms Strength training completed today  Goals Unmet:  Not Applicable  Goals Comments:   Dr. Emily Filbert is Medical Director for [facility].

## 2015-04-21 ENCOUNTER — Encounter: Payer: Medicare Other | Admitting: *Deleted

## 2015-04-21 VITALS — Ht 74.5 in | Wt 220.4 lb

## 2015-04-21 DIAGNOSIS — Z951 Presence of aortocoronary bypass graft: Secondary | ICD-10-CM | POA: Diagnosis not present

## 2015-04-21 NOTE — Progress Notes (Signed)
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    Hypertension   Goal     --  Collie's blood pressure is stable.    Abnormal Lipids   Progress seen  towards goals  Unknown      Comments  No labs to compare.  Will see RD tomorrow to review nutrition goals.         Personal Goals Discharge (Final Personal Goals and Risk Factors Review):      Goals and Risk Factor Review - 04/21/15 1832    Increase Aerobic Exercise and Physical Activity   Goals Progress/Improvement seen  Yes   Comments Bradley [name] really increased his 6 minute walk distance today!!!   Understand more about Heart/Pulmonary Disease   Goals Progress/Improvement seen  Yes   Comments Bradley [name] contributes to the education discussions and answers questions also.    Hypertension   Goal --  Bradley [name]'s blood pressure is stable.        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

## 2015-04-21 NOTE — Progress Notes (Signed)
Discharge Summary  Patient Details  Name: Bradley [name] MRN: [id number] Date of Birth: Mar 01, 1941 Referring Provider:  Fay Records, MD   Number of Visits: 33 visits done soon to have 36 visits done.  Reason for Discharge:  Patient reached a stable level of exercise. Patient independent in their exercise.  Smoking History:  History  Smoking status  . Never Smoker   Smokeless tobacco  . Not on file    Diagnosis:  S/P CABG x 4  ADL UCSD:   Initial Exercise Prescription:     Initial Exercise Prescription - 02/16/15 1100    Date of Initial Exercise Prescription   Date 02/16/15   Treadmill   MPH 2.5   Grade 0   Minutes 15   Bike   Level 0.4   Minutes 10   Recumbant Bike   Level 3   RPM 45   Watts 25   Minutes 15   NuStep   Level 3   Watts 45   Minutes 15   Arm Ergometer   Level 1   Watts 10   Minutes 10   Arm/Foot Ergometer   Level 4   Watts 15   Minutes 10   Cybex   Level 1   RPM 50   Minutes 10   Recumbant Elliptical   Level 1   RPM 40   Watts 10   Minutes 10   Elliptical   Level 1   Speed 3   Minutes 1   REL-XR   Level 3   Watts 45   Minutes 15   Prescription Details   Frequency (times per week) 3   Duration Progress to 30 minutes of continuous aerobic without signs/symptoms of physical distress   Intensity   THRR REST +  30   Ratings of Perceived Exertion 11-15   Progression Continue progressive overload as per policy without signs/symptoms or physical distress.   Resistance Training   Training Prescription Yes   Weight 2   Reps 10-15      Discharge Exercise Prescription (Final Exercise Prescription Changes):     Exercise Prescription Changes - 04/21/15 1600    Exercise Review   Progression Yes   Response to Exercise   Symptoms None   Comments Did discharge 6MW today and improved by 21%. He walked at 3.0 mph which was his goal. Disucssed these results with Bradley [name]. Patient is approaching graduation of the program and home  exercise plans were discussed. Details of the patient's exercise prescription and what they need to do in order to continue the prescription and progress with exercise were outlined and the patient verbalized understanding. The patient plans to complete all exercise at home.    Frequency --   Duration Progress to 30 minutes of continuous aerobic without signs/symptoms of physical distress   Intensity Rest + 30   Progression Continue progressive overload as per policy without signs/symptoms or physical distress.   Resistance Training   Training Prescription Yes   Weight 4   Reps 10-15   Interval Training   Interval Training Yes   Equipment Recumbant Bike   Comments See interval training card   Treadmill   MPH 3   Grade 0   Minutes 15   Recumbant Bike   Level 10   RPM 70   Minutes 15   Home Exercise Plan   Plans to continue exercise at Home  Has a treadmill at home, also has a walking track.  Functional Capacity:     6 Minute Walk      02/16/15 1146 04/21/15 1651     6 Minute Walk   Phase Initial     Distance 1365 feet 1650 feet    Distance % Change  21 %    Walk Time 6 minutes 6 minutes    Resting HR 62 bpm 84 bpm    Resting BP 120/76 mmHg 124/76 mmHg    Max Ex. HR 88 bpm 100 bpm    Max Ex. BP 138/72 mmHg 150/82 mmHg    RPE 13 13    Symptoms No No       Psychological, QOL, Others - Outcomes: PHQ 2/9: Depression screen [facility] 2/9 04/08/2015 02/16/2015  Decreased Interest 0 1  Down, Depressed, Hopeless 0 2  PHQ - 2 Score 0 3  Altered sleeping 2 2  Tired, decreased energy 2 3  Change in appetite 0 3  Feeling bad or failure about yourself  0 0  Trouble concentrating 0 1  Moving slowly or fidgety/restless 0 1  Suicidal thoughts 0 0  PHQ-9 Score 4 13  Difficult doing work/chores Not difficult at all Somewhat difficult    Quality of Life:     Quality of Life - 04/08/15 1801    Quality of Life Scores   Health/Function Post 27.2 %   Health/Function % Change  10 %   Socioeconomic Post 29.14 %   Socioeconomic % Change 6 %   Psych/Spiritual Post 29.14 %   Psych/Spiritual % Change 14 %   Family Post 28.8 %   Family % Change 4 %   GLOBAL Post 28.24 %   GLOBAL % Change 9 %      Personal Goals: Goals established at orientation with interventions provided to work toward goal.     Personal Goals and Risk Factors at Admission - 02/16/15 1335    Personal Goals and Risk Factors on Admission   Hypertension Yes   Goal Participant will see blood pressure controlled within the values of 140/42mm/Hg or within value directed by their physician.   Intervention Provide nutrition & aerobic exercise along with prescribed medications to achieve BP 140/90 or less.       Personal Goals Discharge:     Goals and Risk Factor Review      03/03/15 1155 03/24/15 1801 03/31/15 1137 04/13/15 0655     Weight Management   Goals Progress/Improvement seen  No  No    Comments  No changes reported. Has appointment with RD tomorrow. Just had medication change, Coumadin stopped. Hopopes this will give him more freedom to eat the vegetables/fruits he desires and could not will on Coumadin.  Is up a few pounds from his intake weight, but his weight has not changed much since being in the program. He attributes this partly to his medications and changes with them.     Increase Aerobic Exercise and Physical Activity   Goals Progress/Improvement seen  Yes Yes Yes Yes    Comments  Working intervals on machines. States He feels better after his exercise sessions.  Still feels his legs are weak and hopes to see improvement in his leg strength as he continues with his exercise program. WAnts to work on his legs more and does the [place] for that.  Bradley [name] is a consistent participant in interval training on the recumbent bike and is up to Level 11 and goes HIIT up to 70 rpms. He has noticed gains in his endurance  and exercise tolerance due to the training.     Understand more  about Heart/Pulmonary Disease   Goals Progress/Improvement seen     Yes    Comments    Bradley [name] feels a lot more confident in managing his heart disease due to the education segments and believes they have been helpful for him as he continues to make lifestyle changes.     Abnormal Lipids   Progress seen towards goals  Unknown      Comments  No labs to compare.  Will see RD tomorrow to review nutrition goals.         Nutrition & Weight - Outcomes:     Pre Biometrics - 02/16/15 1145    Pre Biometrics   Height 6' 2.5" (1.892 m)   Weight 214 lb 9.6 oz (97.342 kg)   Waist Circumference 40 inches   Hip Circumference 40.5 inches   Waist to Hip Ratio 0.99 %   BMI (Calculated) 27.2         Post Biometrics - 04/21/15 1651     Post  Biometrics   Height 6' 2.5" (1.892 m)   Weight 220 lb 6.4 oz (99.973 kg)   Waist Circumference 40 inches   Hip Circumference 40.75 inches   Waist to Hip Ratio 0.98 %   BMI (Calculated) 28      Nutrition:     Nutrition Therapy & Goals - 04/02/15 0856    Nutrition Therapy   Diet DASH   Drug/Food Interactions Statins/Certain Fruits  patient states he is no longer taking coumadin   Fiber 30 grams   Whole Grain Foods 3 servings   Protein 8 ounces/day   Saturated Fats 14 max. grams   Fruits and Vegetables 8 servings/day   Personal Nutrition Goals   Personal Goal #1 choose low fat cheese such as 2%, use small portions of cheese and cut thin slices from block.   Personal Goal #2 Eat 1 snack every 3-4 hours, allow at least 2 hours between snacks or snacks and meals.       Nutrition Discharge:     Rate Your Plate - 71/24/58 0998    Rate Your Plate Scores   Post Score 71   Post Score % 79 %      Education Questionnaire Score:     Knowledge Questionnaire Score - 04/08/15 1726    Knowledge Questionnaire Score   Post Score 25      Goals reviewed with patient; copy given to patient.

## 2015-04-21 NOTE — Progress Notes (Signed)
Daily Session Note  Patient Details  Name: Bradley [name] MRN: [id number] Date of Birth: [birth year] Referring Provider:  Fay Records, MD  Encounter Date: 04/21/2015  Check In:     Session Check In - 04/21/15 1649    Check-In   Staff Present Candiss Norse MS, ACSM CEP Exercise Physiologist;Carroll Enterkin RN, BSN;Diane Joya Gaskins RN, BSN   ER physicians immediately available to respond to emergencies See telemetry face sheet for immediately available ER MD   Medication changes reported     No   Fall or balance concerns reported    No   Warm-up and Cool-down Performed on first and last piece of equipment   VAD Patient? No   Pain Assessment   Currently in Pain? No/denies   Multiple Pain Sites No           Exercise Prescription Changes - 04/21/15 1600    Exercise Review   Progression Yes   Response to Exercise   Symptoms None   Comments Did discharge 6MW today and improved by 21%. He walked at 3.0 mph which was his goal. Disucssed these results with [name]. Patient is approaching graduation of the program and home exercise plans were discussed. Details of the patient's exercise prescription and what they need to do in order to continue the prescription and progress with exercise were outlined and the patient verbalized understanding. The patient plans to complete all exercise at home.    Frequency --   Duration Progress to 30 minutes of continuous aerobic without signs/symptoms of physical distress   Intensity Rest + 30   Progression Continue progressive overload as per policy without signs/symptoms or physical distress.   Resistance Training   Training Prescription Yes   Weight 4   Reps 10-15   Interval Training   Interval Training Yes   Equipment Recumbant Bike   Comments See interval training card   Treadmill   MPH 3   Grade 0   Minutes 15   Recumbant Bike   Level 10   RPM 70   Minutes 15   Home Exercise Plan   Plans to continue exercise at Home  Has a treadmill  at home, also has a walking track.      Goals Met:  Independence with exercise equipment Exercise tolerated well Personal goals reviewed No report of cardiac concerns or symptoms Strength training completed today  Goals Unmet:  Not Applicable  Goals Comments: Completed post 6MW and increased by 21%   Dr. Emily Filbert is Medical Director for [place] and [facility].

## 2015-04-21 NOTE — Patient Instructions (Signed)
Discharge Instructions  Patient Details  Name: Bradley [name] MRN: [id number] Date of Birth: [birth year] Referring Provider:  Fay Records, MD   Number of Visits: 33 visits done soon to have 36/36 done  Reason for Discharge:  Patient reached a stable level of exercise. Patient independent in their exercise.  Smoking History:  History  Smoking status  . Never Smoker   Smokeless tobacco  . Not on file    Diagnosis:  S/P CABG x 4  Initial Exercise Prescription:     Initial Exercise Prescription - 02/16/15 1100    Date of Initial Exercise Prescription   Date 02/16/15   Treadmill   MPH 2.5   Grade 0   Minutes 15   Bike   Level 0.4   Minutes 10   Recumbant Bike   Level 3   RPM 45   Watts 25   Minutes 15   NuStep   Level 3   Watts 45   Minutes 15   Arm Ergometer   Level 1   Watts 10   Minutes 10   Arm/Foot Ergometer   Level 4   Watts 15   Minutes 10   Cybex   Level 1   RPM 50   Minutes 10   Recumbant Elliptical   Level 1   RPM 40   Watts 10   Minutes 10   Elliptical   Level 1   Speed 3   Minutes 1   REL-XR   Level 3   Watts 45   Minutes 15   Prescription Details   Frequency (times per week) 3   Duration Progress to 30 minutes of continuous aerobic without signs/symptoms of physical distress   Intensity   THRR REST +  30   Ratings of Perceived Exertion 11-15   Progression Continue progressive overload as per policy without signs/symptoms or physical distress.   Resistance Training   Training Prescription Yes   Weight 2   Reps 10-15      Discharge Exercise Prescription (Final Exercise Prescription Changes):     Exercise Prescription Changes - 04/21/15 1600    Exercise Review   Progression Yes   Response to Exercise   Symptoms None   Comments Did discharge 6MW today and improved by 21%. He walked at 3.0 mph which was his goal. Disucssed these results with [name]. Patient is approaching graduation of the program and home exercise plans  were discussed. Details of the patient's exercise prescription and what they need to do in order to continue the prescription and progress with exercise were outlined and the patient verbalized understanding. The patient plans to complete all exercise at home.    Frequency --   Duration Progress to 30 minutes of continuous aerobic without signs/symptoms of physical distress   Intensity Rest + 30   Progression Continue progressive overload as per policy without signs/symptoms or physical distress.   Resistance Training   Training Prescription Yes   Weight 4   Reps 10-15   Interval Training   Interval Training Yes   Equipment Recumbant Bike   Comments See interval training card   Treadmill   MPH 3   Grade 0   Minutes 15   Recumbant Bike   Level 10   RPM 70   Minutes 15   Home Exercise Plan   Plans to continue exercise at Home  Has a treadmill at home, also has a walking track.      Functional Capacity:  6 Minute Walk      02/16/15 1146 04/21/15 1651     6 Minute Walk   Phase Initial     Distance 1365 feet 1650 feet    Distance % Change  21 %    Walk Time 6 minutes 6 minutes    Resting HR 62 bpm 84 bpm    Resting BP 120/76 mmHg 124/76 mmHg    Max Ex. HR 88 bpm 100 bpm    Max Ex. BP 138/72 mmHg 150/82 mmHg    RPE 13 13    Symptoms No No       Quality of Life:     Quality of Life - 04/08/15 1801    Quality of Life Scores   Health/Function Post 27.2 %   Health/Function % Change 10 %   Socioeconomic Post 29.14 %   Socioeconomic % Change 6 %   Psych/Spiritual Post 29.14 %   Psych/Spiritual % Change 14 %   Family Post 28.8 %   Family % Change 4 %   GLOBAL Post 28.24 %   GLOBAL % Change 9 %      Personal Goals: Goals established at orientation with interventions provided to work toward goal.     Personal Goals and Risk Factors at Admission - 02/16/15 1335    Personal Goals and Risk Factors on Admission   Hypertension Yes   Goal Participant will see  blood pressure controlled within the values of 140/20mm/Hg or within value directed by their physician.   Intervention Provide nutrition & aerobic exercise along with prescribed medications to achieve BP 140/90 or less.       Personal Goals Discharge:     Goals and Risk Factor Review - 04/13/15 0655    Weight Management   Goals Progress/Improvement seen No   Comments Is up a few pounds from his intake weight, but his weight has not changed much since being in the program. He attributes this partly to his medications and changes with them.    Increase Aerobic Exercise and Physical Activity   Goals Progress/Improvement seen  Yes   Comments Bradley [name] is a consistent participant in interval training on the recumbent bike and is up to Level 11 and goes HIIT up to 70 rpms. He has noticed gains in his endurance and exercise tolerance due to the training.    Understand more about Heart/Pulmonary Disease   Goals Progress/Improvement seen  Yes   Comments Bradley [name] feels a lot more confident in managing his heart disease due to the education segments and believes they have been helpful for him as he continues to make lifestyle changes.       Nutrition & Weight - Outcomes:     Pre Biometrics - 02/16/15 1145    Pre Biometrics   Height 6' 2.5" (1.892 m)   Weight 214 lb 9.6 oz (97.342 kg)   Waist Circumference 40 inches   Hip Circumference 40.5 inches   Waist to Hip Ratio 0.99 %   BMI (Calculated) 27.2         Post Biometrics - 04/21/15 1651     Post  Biometrics   Height 6' 2.5" (1.892 m)   Weight 220 lb 6.4 oz (99.973 kg)   Waist Circumference 40 inches   Hip Circumference 40.75 inches   Waist to Hip Ratio 0.98 %   BMI (Calculated) 28      Nutrition:     Nutrition Therapy & Goals - 04/02/15 0856    Nutrition Therapy  Diet DASH   Drug/Food Interactions Statins/Certain Fruits  patient states he is no longer taking coumadin   Fiber 30 grams   Whole Grain Foods 3 servings   Protein 8  ounces/day   Saturated Fats 14 max. grams   Fruits and Vegetables 8 servings/day   Personal Nutrition Goals   Personal Goal #1 choose low fat cheese such as 2%, use small portions of cheese and cut thin slices from block.   Personal Goal #2 Eat 1 snack every 3-4 hours, allow at least 2 hours between snacks or snacks and meals.       Nutrition Discharge:     Rate Your Plate - 76/80/88 1103    Rate Your Plate Scores   Post Score 71   Post Score % 79 %      Education Questionnaire Score:     Knowledge Questionnaire Score - 04/08/15 1726    Knowledge Questionnaire Score   Post Score 25      Goals reviewed with patient; copy given to patient.

## 2015-04-22 DIAGNOSIS — Z951 Presence of aortocoronary bypass graft: Secondary | ICD-10-CM

## 2015-04-22 NOTE — Progress Notes (Signed)
Daily Session Note  Patient Details  Name: CAELUM FEDERICI MRN: [id number] Date of Birth: 12/31/1940 Referring Provider:  Fay Records, MD  Encounter Date: 04/22/2015  Check In:     Session Check In - 04/22/15 1623    Check-In   Staff Present Nyoka Cowden RN;Diane Joya Gaskins RN, BSN;Athanasios Heldman BS, ACSM EP-C, Exercise Physiologist   ER physicians immediately available to respond to emergencies See telemetry face sheet for immediately available ER MD   Medication changes reported     No   Fall or balance concerns reported    No   Warm-up and Cool-down Performed on first and last piece of equipment   VAD Patient? No   Pain Assessment   Currently in Pain? No/denies         Goals Met:  Proper associated with RPD/PD & O2 Sat Exercise tolerated well No report of cardiac concerns or symptoms Strength training completed today  Goals Unmet:  Not Applicable  Goals Comments:    Dr. Emily Filbert is Medical Director for [place] and [facility].

## 2015-04-26 ENCOUNTER — Encounter: Payer: Self-pay | Admitting: *Deleted

## 2015-04-26 DIAGNOSIS — Z951 Presence of aortocoronary bypass graft: Secondary | ICD-10-CM | POA: Diagnosis not present

## 2015-04-26 NOTE — Progress Notes (Signed)
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 his exercise sessions.  Still feels his legs are weak and hopes to see improvement in his leg strength as he continues with his exercise program. WAnts to work on his legs more and does the Bradley [name] for that.  Bradley [name] is a consistent participant in interval training on the recumbent bike and is up to Level  11 and goes HIIT up to 70 rpms. He has noticed gains in his endurance and exercise tolerance due to the training.  Bradley [name] really increased his 6 minute walk distance today!!!   Understand more about Heart/Pulmonary Disease   Goals Progress/Improvement seen     Yes Yes   Comments    Bradley [name] feels a lot more confident in managing his heart disease due to the education segments and believes they have been helpful for him as he continues to make lifestyle changes.  Bradley [name] contributes to the education discussions and answers questions also.    Hypertension   Goal     --  Bradley [name]'s blood pressure is stable.    Abnormal Lipids   Progress seen towards goals  Unknown      Comments  No labs to compare.  Will  see RD tomorrow to review nutrition goals.        04/26/15 1905           Weight Management   Goals Progress/Improvement seen Yes       Increase Aerobic Exercise and Physical Activity   Goals Progress/Improvement seen  Yes       Understand more about Heart/Pulmonary Disease   Goals Progress/Improvement seen  Yes       Abnormal Lipids   Progress seen towards goals Unknown       Comments Follow up blood work with his MD.          Personal Goals Discharge (Final Personal Goals and Risk Factors Review):      Goals and Risk Factor Review - 04/26/15 1905    Weight Management   Goals Progress/Improvement seen Yes   Increase Aerobic Exercise and Physical Activity   Goals Progress/Improvement seen  Yes   Understand more about Heart/Pulmonary Disease   Goals Progress/Improvement seen  Yes   Abnormal Lipids   Progress seen towards goals Unknown   Comments Follow up blood work with his MD.       Comments: Bradley [name]'s [place] after the recent [place]. Stress with this but is glad he finished [facility] today.

## 2015-04-26 NOTE — Progress Notes (Signed)
Daily Session Note  Patient Details  Name: Bradley [name] MRN: [id number] Date of Birth: [birth year] Referring Provider:  Fay Records, MD  Encounter Date: 04/26/2015  Check In:     Session Check In - 04/26/15 1737    Check-In   Staff Present Gerlene Burdock RN, BSN;Steven Way BS, ACSM EP-C, Exercise Physiologist;Other   ER physicians immediately available to respond to emergencies See telemetry face sheet for immediately available ER MD   Medication changes reported     No   Fall or balance concerns reported    No   Warm-up and Cool-down Performed on first and last piece of equipment   VAD Patient? No   Pain Assessment   Currently in Pain? No/denies           Exercise Prescription Changes - 04/26/15 0700    Exercise Review   Progression Yes   Response to Exercise   Blood Pressure (Admit) 114/76 mmHg   Blood Pressure (Exercise) 134/72 mmHg   Blood Pressure (Exit) 124/80 mmHg   Heart Rate (Admit) 81 bpm   Heart Rate (Exercise) 95 bpm   Heart Rate (Exit) 76 bpm   Rating of Perceived Exertion (Exercise) 15   Symptoms None   Comments Reviewed individualized exercise prescription and made increases per departmental policy. Exercise increases were discussed with the patient and they were able to perform the new work loads without issue (no signs or symptoms).    Duration Progress to 30 minutes of continuous aerobic without signs/symptoms of physical distress   Intensity Rest + 30   Progression Continue progressive overload as per policy without signs/symptoms or physical distress.   Resistance Training   Training Prescription Yes   Weight 4   Reps 10-15   Interval Training   Interval Training Yes   Equipment Recumbant Bike   Comments See interval training card   Treadmill   MPH 3   Grade 0   Minutes 20   Recumbant Bike   Level 12   RPM 70   Minutes 20   Home Exercise Plan   Plans to continue exercise at Home  Has a treadmill at home, also has a walking track.      Goals Met:  Proper associated with RPD/PD & O2 Sat Exercise tolerated well No report of cardiac concerns or symptoms Strength training completed today  Goals Unmet:  Not Applicable  Goals Comments:    Dr. Emily Filbert is Medical Director for [facility] and [facility].

## 2015-04-26 NOTE — Addendum Note (Signed)
Addended by: Gerlene Burdock on: 04/26/2015 07:07 PM   Modules accepted: Orders

## 2015-08-06 ENCOUNTER — Encounter: Payer: Self-pay | Admitting: *Deleted

## 2015-08-09 ENCOUNTER — Ambulatory Visit: Payer: Medicare Other | Admitting: Anesthesiology

## 2015-08-09 ENCOUNTER — Encounter: Payer: Self-pay | Admitting: *Deleted

## 2015-08-09 ENCOUNTER — Encounter
Admission: RE | Disposition: A | Discharge status: Home / Self Care | Payer: Self-pay | Source: Ambulatory Visit | Attending: Gastroenterology

## 2015-08-09 ENCOUNTER — Ambulatory Visit
Admission: RE | Admit: 2015-08-09 | Discharge: 2015-08-09 | Disposition: A | Discharge status: Home / Self Care | Payer: Medicare Other | Source: Ambulatory Visit | Attending: Gastroenterology | Admitting: Gastroenterology

## 2015-08-09 DIAGNOSIS — R1013 Epigastric pain: Secondary | ICD-10-CM | POA: Diagnosis not present

## 2015-08-09 DIAGNOSIS — Z7901 Long term (current) use of anticoagulants: Secondary | ICD-10-CM | POA: Diagnosis not present

## 2015-08-09 DIAGNOSIS — Z841 Family history of disorders of kidney and ureter: Secondary | ICD-10-CM | POA: Insufficient documentation

## 2015-08-09 DIAGNOSIS — I4510 Unspecified right bundle-branch block: Secondary | ICD-10-CM | POA: Diagnosis not present

## 2015-08-09 DIAGNOSIS — Z809 Family history of malignant neoplasm, unspecified: Secondary | ICD-10-CM | POA: Insufficient documentation

## 2015-08-09 DIAGNOSIS — I2510 Atherosclerotic heart disease of native coronary artery without angina pectoris: Secondary | ICD-10-CM | POA: Insufficient documentation

## 2015-08-09 DIAGNOSIS — H409 Unspecified glaucoma: Secondary | ICD-10-CM | POA: Diagnosis not present

## 2015-08-09 DIAGNOSIS — N400 Enlarged prostate without lower urinary tract symptoms: Secondary | ICD-10-CM | POA: Diagnosis not present

## 2015-08-09 DIAGNOSIS — Z9842 Cataract extraction status, left eye: Secondary | ICD-10-CM | POA: Diagnosis not present

## 2015-08-09 DIAGNOSIS — Z79899 Other long term (current) drug therapy: Secondary | ICD-10-CM | POA: Insufficient documentation

## 2015-08-09 DIAGNOSIS — Z8719 Personal history of other diseases of the digestive system: Secondary | ICD-10-CM | POA: Diagnosis not present

## 2015-08-09 DIAGNOSIS — I10 Essential (primary) hypertension: Secondary | ICD-10-CM | POA: Diagnosis not present

## 2015-08-09 DIAGNOSIS — Z820 Family history of epilepsy and other diseases of the nervous system: Secondary | ICD-10-CM | POA: Diagnosis not present

## 2015-08-09 DIAGNOSIS — K2270 Barrett's esophagus without dysplasia: Secondary | ICD-10-CM | POA: Insufficient documentation

## 2015-08-09 DIAGNOSIS — Z9049 Acquired absence of other specified parts of digestive tract: Secondary | ICD-10-CM | POA: Diagnosis not present

## 2015-08-09 DIAGNOSIS — Z7982 Long term (current) use of aspirin: Secondary | ICD-10-CM | POA: Insufficient documentation

## 2015-08-09 DIAGNOSIS — K449 Diaphragmatic hernia without obstruction or gangrene: Secondary | ICD-10-CM | POA: Diagnosis not present

## 2015-08-09 DIAGNOSIS — Z800 Family history of malignant neoplasm of digestive organs: Secondary | ICD-10-CM | POA: Insufficient documentation

## 2015-08-09 DIAGNOSIS — Z885 Allergy status to narcotic agent status: Secondary | ICD-10-CM | POA: Diagnosis not present

## 2015-08-09 DIAGNOSIS — E785 Hyperlipidemia, unspecified: Secondary | ICD-10-CM | POA: Diagnosis not present

## 2015-08-09 DIAGNOSIS — D329 Benign neoplasm of meninges, unspecified: Secondary | ICD-10-CM | POA: Diagnosis not present

## 2015-08-09 DIAGNOSIS — Z882 Allergy status to sulfonamides status: Secondary | ICD-10-CM | POA: Diagnosis not present

## 2015-08-09 DIAGNOSIS — Z8042 Family history of malignant neoplasm of prostate: Secondary | ICD-10-CM | POA: Insufficient documentation

## 2015-08-09 DIAGNOSIS — K219 Gastro-esophageal reflux disease without esophagitis: Secondary | ICD-10-CM | POA: Insufficient documentation

## 2015-08-09 DIAGNOSIS — G43909 Migraine, unspecified, not intractable, without status migrainosus: Secondary | ICD-10-CM | POA: Diagnosis not present

## 2015-08-09 DIAGNOSIS — Z9841 Cataract extraction status, right eye: Secondary | ICD-10-CM | POA: Diagnosis not present

## 2015-08-09 HISTORY — DX: Personal history of other infectious and parasitic diseases: Z86.19

## 2015-08-09 HISTORY — DX: Unspecified glaucoma: H40.9

## 2015-08-09 HISTORY — PX: ESOPHAGOGASTRODUODENOSCOPY (EGD) WITH PROPOFOL: SHX5813

## 2015-08-09 SURGERY — ESOPHAGOGASTRODUODENOSCOPY (EGD) WITH PROPOFOL
Anesthesia: General

## 2015-08-09 MED ORDER — SODIUM CHLORIDE 0.9 % IV SOLN
INTRAVENOUS | Status: DC
Start: 2015-08-09 — End: 2015-08-09

## 2015-08-09 MED ORDER — GLYCOPYRROLATE 0.2 MG/ML IJ SOLN
INTRAMUSCULAR | Status: DC | PRN
  Administered 2015-08-09: 0.2 mg via INTRAVENOUS

## 2015-08-09 MED ORDER — SODIUM CHLORIDE 0.9 % IV SOLN
INTRAVENOUS | Status: DC
  Administered 2015-08-09: 1000 mL via INTRAVENOUS

## 2015-08-09 MED ORDER — PROPOFOL 500 MG/50ML IV EMUL
INTRAVENOUS | Status: DC | PRN
  Administered 2015-08-09: 150 ug/kg/min via INTRAVENOUS

## 2015-08-09 MED ORDER — LIDOCAINE HCL (CARDIAC) 20 MG/ML IV SOLN
INTRAVENOUS | Status: DC | PRN
  Administered 2015-08-09: 100 mg via INTRAVENOUS

## 2015-08-09 MED ORDER — PROPOFOL 10 MG/ML IV BOLUS
INTRAVENOUS | Status: DC | PRN
  Administered 2015-08-09: 100 mg via INTRAVENOUS

## 2015-08-09 NOTE — Transfer of Care (Signed)
Immediate Anesthesia Transfer of Care Note  Patient: Bradley [name]  Procedure(s) Performed: Procedure(s): ESOPHAGOGASTRODUODENOSCOPY (EGD) WITH PROPOFOL (N/A)  Patient Location: Endoscopy Unit  Anesthesia Type:General  Level of Consciousness: sedated  Airway & Oxygen Therapy: Patient Spontanous Breathing and Patient connected to nasal cannula oxygen  Post-op Assessment: Report given to RN and Post -op Vital signs reviewed and stable  Post vital signs: Reviewed and stable  Last Vitals:  Filed Vitals:   08/09/15 0944 08/09/15 1100  BP: 164/83   Pulse: 58   Temp: 36.4 C 36.3 C  Resp: 18     Complications: No apparent anesthesia complications

## 2015-08-09 NOTE — H&P (Signed)
Primary Care Physician:  [facility], MD Primary Gastroenterologist:  Dr. Candace Cruise  Pre-Procedure History & Physical: HPI:  Bradley [name] is a 75 y.o. male is here for an EGD.  Past Medical History  Diagnosis Date  . Coronary artery disease   . Meningioma ([place])     stable, followed by yearly MRIs  . History of hiatal hernia 2012  . Essential hypertension 06/05/2014  . Hyperlipidemia   . BPH (benign prostatic hyperplasia)   . Cataracts, bilateral   . Hemorrhoids   . Migraines   . RBBB   . Stomach ulcer   . GI bleed     a. ~2012 around time of hiatal hernia surgery. 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  Marland Kitchen History of chicken pox   . Glaucoma   . GERD (gastroesophageal reflux disease)     Barretts esophagus    Past Surgical History  Procedure Laterality Date  . Cardiac surgery      coronary angioplasty  . Cholecystectomy    . Hiatal hernia repair N/A   . Cardiac catheterization N/A 01/07/2015    Procedure: Left Heart Cath;  Surgeon: Corey Skains, MD;  Location: [place] [facility];  Service: Cardiovascular;  Laterality: N/A;  . Colonoscopy  11/03/08, 03/09/14  . Esophagogastroduodenoscopy  02/19/13  . Esophagus surgery    . Coronary angioplasty    . Atherectomy  1992  . Diagnostic laparoscopy      cholecystectomy and hiatal hernia repair  . Eye surgery      cataracts BIL  . Hernia repair    . Coronary artery bypass graft N/A 01/19/2015    Procedure: CORONARY ARTERY BYPASS GRAFTING (CABG), with LIM to LAD, vein to left intermediate, vein to PD, vein to OM, using right greater saphenous vein via endovein harvest.;  Surgeon: Grace Isaac, MD;  Location: [place];  Service: Open Heart Surgery;  Laterality: N/A;  . Tee without cardioversion N/A 01/19/2015    Procedure: TRANSESOPHAGEAL ECHOCARDIOGRAM (TEE);  Surgeon: Grace Isaac, MD;  Location: [place];  Service: Open Heart Surgery;  Laterality: N/A;  . Endovein harvest of greater saphenous  vein Right 01/19/2015    Procedure: ENDOVEIN HARVEST OF GREATER SAPHENOUS VEIN;  Surgeon: Grace Isaac, MD;  Location: [place];  Service: Open Heart Surgery;  Laterality: Right;    Prior to Admission medications   Medication Sig Start Date End Date Taking? Authorizing Provider  fluticasone (FLONASE) 50 MCG/ACT nasal spray Place 2 sprays into both nostrils daily.   Yes Historical Provider, MD  hydrocortisone (ANUSOL-HC) 25 MG suppository Place 25 mg rectally 2 (two) times daily.   Yes Historical Provider, MD  nitroGLYCERIN (NITROSTAT) 0.4 MG SL tablet Place 0.4 mg under the tongue every 5 (five) minutes as needed for chest pain.   Yes Historical Provider, MD  omeprazole (PRILOSEC) 40 MG capsule Take 40 mg by mouth daily.   Yes Historical Provider, MD  timolol (TIMOPTIC) 0.25 % ophthalmic solution 1 drop 2 (two) times daily.   Yes Historical Provider, MD  amiodarone (PACERONE) 200 MG tablet Take by mouth daily.  01/30/15   Historical Provider, MD  aspirin 81 MG tablet Take 81 mg by mouth daily.    Historical Provider, MD  atorvastatin (LIPITOR) 20 MG tablet Take 20 mg by mouth daily.    Historical Provider, MD  benzocaine (AMERICAINE) 20 % rectal ointment Place rectally every 3 (three) hours as needed for pain. 02/06/15   Mickel Baas  Darliss Cheney, MD  cetirizine (ZYRTEC) 5 MG tablet Take 5 mg by mouth daily.    Historical Provider, MD  chlorhexidine (PERIDEX) 0.12 % solution Use as directed 15 mLs in the mouth or throat 2 (two) times daily.    Historical Provider, MD  cyanocobalamin 100 MCG tablet Take 100 mcg by mouth daily.    Historical Provider, MD  dutasteride (AVODART) 0.5 MG capsule Take 0.5 mg by mouth daily.    Historical Provider, MD  latanoprost (XALATAN) 0.005 % ophthalmic solution Place 1 drop into both eyes at bedtime.    Historical Provider, MD  timolol (BETIMOL) 0.25 % ophthalmic solution Place 1 drop into both eyes daily.    Historical Provider, MD  traMADol (ULTRAM) 50 MG tablet Take 1-2  tablets (50-100 mg total) by mouth every 4 (four) hours as needed for moderate pain. 01/30/15   [name] R Barrett, PA-C  triamcinolone cream (KENALOG) 0.1 % Apply 1 application topically 2 (two) times daily as needed (Foot).    Historical Provider, MD  warfarin (COUMADIN) 5 MG tablet Take 1 tablet (5 mg total) by mouth daily at 6 PM. Patient not taking: Reported on 03/24/2015 01/30/15   [name] R Barrett, PA-C    Allergies as of 07/14/2015 - Review Complete 03/04/2015  Allergen Reaction Noted  . Oxycodone Other (See Comments) 11/20/2014  . Sulfa antibiotics Rash 11/20/2014    Family History  Problem Relation Age of Onset  . Parkinsonism Mother   . Kidney disease Father   . Cancer Father     prostate  . Heart disease Father   . Cancer Paternal Uncle     esophageal  . Cancer Paternal Uncle     Social History   Social History  . Marital Status: Married    Spouse Name: N/A  . Number of Children: N/A  . Years of Education: N/A   Occupational History  . Not on file.   Social History Main Topics  . Smoking status: Never Smoker   . Smokeless tobacco: Never Used  . Alcohol Use: No  . Drug Use: No  . Sexual Activity: Not on file   Other Topics Concern  . Not on file   Social History Narrative    Review of Systems: See HPI, otherwise negative ROS  Physical Exam: BP 164/83 mmHg  Pulse 58  Temp(Src) 97.5 F (36.4 C) (Oral)  Resp 18  Ht 6\' 3"  (1.905 m)  Wt 98.884 kg (218 lb)  BMI 27.25 kg/m2  SpO2 100% General:   Alert,  pleasant and cooperative in NAD Head:  Normocephalic and atraumatic. Neck:  Supple; no masses or thyromegaly. Lungs:  Clear throughout to auscultation.    Heart:  Regular rate and rhythm. Abdomen:  Soft, nontender and nondistended. Normal bowel sounds, without guarding, and without rebound.   Neurologic:  Alert and  oriented x4;  grossly normal neurologically.  Impression/Plan: Bradley [name] is here for an EGD to be performed for epigastric pain,  GERD. Risks, benefits, limitations, and alternatives regarding  EGD have been reviewed with the patient.  Questions have been answered.  All parties agreeable.   Gali Spinney, Lupita Dawn, MD  08/09/2015, 9:58 AM

## 2015-08-09 NOTE — Op Note (Signed)
[facility] Patient Name: Bradley [name] Procedure Date: 08/09/2015 10:54 AM MRN: [id number] Account #: [id number] Date of Birth: 04/28/1941 Admit Type: Outpatient Age: 75 Room: [facility] ENDO ROOM 4 Gender: Male Note Status: Finalized Procedure:         Upper GI endoscopy Indications:       Epigastric abdominal pain, Follow-up of esophageal reflux,                     Follow-up of Barrett's esophagus Providers:         Lupita Dawn. Candace Cruise, MD Medicines:         Monitored Anesthesia Care Complications:     No immediate complications. Procedure:         Pre-Anesthesia Assessment:                    - Prior to the procedure, a History and Physical was                     performed, and patient medications, allergies and                     sensitivities were reviewed. The patient's tolerance of                     previous anesthesia was reviewed.                    - The risks and benefits of the procedure and the sedation                     options and risks were discussed with the patient. All                     questions were answered and informed consent was obtained.                    - After reviewing the risks and benefits, the patient was                     deemed in satisfactory condition to undergo the procedure.                    After obtaining informed consent, the endoscope was passed                     under direct vision. Throughout the procedure, the                     patient's blood pressure, pulse, and oxygen saturations                     were monitored continuously. The Endoscope was introduced                     through the mouth, and advanced to the second part of                     duodenum. The upper GI endoscopy was accomplished without                     difficulty. The patient tolerated the procedure well. Findings:      There were esophageal mucosal changes consistent with short-segment       Barrett's esophagus present in  the lower third of the esophagus. The       maximum longitudinal extent of these mucosal changes was 2 cm in length.       Mucosa was biopsied with a cold forceps for histology in 4 quadrants in       the lower third of the esophagus. One specimen bottle was sent to       pathology.      The exam was otherwise without abnormality.      A medium amount of food (residue) was found in the gastric fundus.      A medium-sized hiatus hernia was present.      The exam was otherwise without abnormality.      The examined duodenum was normal. Impression:        - Esophageal mucosal changes consistent with short-segment                     Barrett's esophagus. Biopsied.                    - The examination was otherwise normal.                    - A medium amount of food (residue) in the stomach.                    - Medium-sized hiatus hernia.                    - The examination was otherwise normal.                    - Likely has gastroparesis.                    - Normal examined duodenum. Recommendation:    - Discharge patient to home.                    - Observe patient's clinical course.                    - Continue present medications.                    - The findings and recommendations were discussed with the                     patient.                    - Await pathology results.                    - May need low dose reglan to empty stomach faster. Procedure Code(s): --- Professional ---                    [phone], Esophagogastroduodenoscopy, flexible, transoral;                     with biopsy, single or multiple Diagnosis Code(s): --- Professional ---                    K22.70, Barrett's esophagus without dysplasia                    K44.9, Diaphragmatic hernia without obstruction or gangrene                    R10.13,  Epigastric pain                    K21.9, Gastro-esophageal reflux disease without esophagitis CPT copyright 2014 American Medical Association. All rights  reserved. The codes documented in this report are preliminary and upon coder review may  be revised to meet current compliance requirements. Hulen Luster, MD 08/09/2015 11:07:14 AM This report has been signed electronically. Number of Addenda: 0 Note Initiated On: 08/09/2015 10:54 AM      [facility]

## 2015-08-09 NOTE — Anesthesia Preprocedure Evaluation (Signed)
Anesthesia Evaluation  Patient identified by MRN, date of birth, ID band Patient awake    Reviewed: Allergy & Precautions, H&P , NPO status , Patient's Chart, lab work & pertinent test results, reviewed documented beta blocker date and time   History of Anesthesia Complications Negative for: history of anesthetic complications  Airway Mallampati: III  TM Distance: >3 FB Neck ROM: full    Dental no notable dental hx. (+) Caps, Missing   Pulmonary neg pulmonary ROS,    Pulmonary exam normal breath sounds clear to auscultation       Cardiovascular Exercise Tolerance: Good hypertension, On Medications + angina (intermittent, not associated with exertion) + CAD and + CABG  (-) Past MI and (-) Cardiac Stents Normal cardiovascular exam+ dysrhythmias (RBBB, and h/o a fib s/p cardioversion) (-) Valvular Problems/Murmurs Rhythm:regular Rate:Normal     Neuro/Psych negative neurological ROS  negative psych ROS   GI/Hepatic negative GI ROS, Neg liver ROS, hiatal hernia, PUD, GERD  Medicated,  Endo/Other  negative endocrine ROS  Renal/GU negative Renal ROS  negative genitourinary   Musculoskeletal   Abdominal   Peds  Hematology negative hematology ROS (+)   Anesthesia Other Findings Past Medical History:   Coronary artery disease                                      Meningioma ([facility])                                               Comment:stable, followed by yearly MRIs   History of hiatal hernia                        2012         Essential hypertension                          06/05/2014   Hyperlipidemia                                               BPH (benign prostatic hyperplasia)                           Cataracts, bilateral                                         Hemorrhoids                                                  Migraines                                                    RBBB  Stomach ulcer                                                GI bleed                                                       Comment:a. ~2012 around time of hiatal hernia surgery.               Developed melena/BRBPR, was placed back on               Dexilant long-term.   History of chicken pox                                       Glaucoma                                                     GERD (gastroesophageal reflux disease)                         Comment:Barretts esophagus   Reproductive/Obstetrics negative OB ROS                             Anesthesia Physical Anesthesia Plan  ASA: III  Anesthesia Plan: General   Post-op Pain Management:    Induction:   Airway Management Planned:   Additional Equipment:   Intra-op Plan:   Post-operative Plan:   Informed Consent: I have reviewed the patients History and Physical, chart, labs and discussed the procedure including the risks, benefits and alternatives for the proposed anesthesia with the patient or authorized representative who has indicated his/her understanding and acceptance.   Dental Advisory Given  Plan Discussed with: Anesthesiologist, CRNA and Surgeon  Anesthesia Plan Comments:         Anesthesia Quick Evaluation

## 2015-08-10 LAB — SURGICAL PATHOLOGY

## 2015-08-10 NOTE — Anesthesia Postprocedure Evaluation (Signed)
Anesthesia Post Note  Patient: BURNEST BRESS  Procedure(s) Performed: Procedure(s) (LRB): ESOPHAGOGASTRODUODENOSCOPY (EGD) WITH PROPOFOL (N/A)  Patient location during evaluation: Endoscopy Anesthesia Type: General Level of consciousness: awake and alert Pain management: pain level controlled Vital Signs Assessment: post-procedure vital signs reviewed and stable Respiratory status: spontaneous breathing, nonlabored ventilation, respiratory function stable and patient connected to nasal cannula oxygen Cardiovascular status: blood pressure returned to baseline and stable Postop Assessment: no signs of nausea or vomiting Anesthetic complications: no    Last Vitals:  Filed Vitals:   08/09/15 1140 08/09/15 1148  BP: 115/77 110/74  Pulse:  60  Temp:    Resp:  16    Last Pain: There were no vitals filed for this visit.               Martha Clan

## 2015-08-11 ENCOUNTER — Encounter: Payer: Self-pay | Admitting: Gastroenterology

## 2015-10-19 ENCOUNTER — Observation Stay
Admission: EM | Admit: 2015-10-19 | Discharge: 2015-10-20 | Disposition: A | Discharge status: Home / Self Care | Payer: Medicare Other | Attending: Internal Medicine | Admitting: Internal Medicine

## 2015-10-19 ENCOUNTER — Encounter: Payer: Self-pay | Admitting: Emergency Medicine

## 2015-10-19 ENCOUNTER — Emergency Department: Payer: Medicare Other

## 2015-10-19 DIAGNOSIS — R079 Chest pain, unspecified: Secondary | ICD-10-CM | POA: Diagnosis present

## 2015-10-19 DIAGNOSIS — I4510 Unspecified right bundle-branch block: Secondary | ICD-10-CM | POA: Insufficient documentation

## 2015-10-19 DIAGNOSIS — Z882 Allergy status to sulfonamides status: Secondary | ICD-10-CM | POA: Diagnosis not present

## 2015-10-19 DIAGNOSIS — Z820 Family history of epilepsy and other diseases of the nervous system: Secondary | ICD-10-CM | POA: Insufficient documentation

## 2015-10-19 DIAGNOSIS — Z8711 Personal history of peptic ulcer disease: Secondary | ICD-10-CM | POA: Diagnosis not present

## 2015-10-19 DIAGNOSIS — Z9049 Acquired absence of other specified parts of digestive tract: Secondary | ICD-10-CM | POA: Insufficient documentation

## 2015-10-19 DIAGNOSIS — R61 Generalized hyperhidrosis: Secondary | ICD-10-CM | POA: Insufficient documentation

## 2015-10-19 DIAGNOSIS — Z8042 Family history of malignant neoplasm of prostate: Secondary | ICD-10-CM | POA: Diagnosis not present

## 2015-10-19 DIAGNOSIS — G43909 Migraine, unspecified, not intractable, without status migrainosus: Secondary | ICD-10-CM | POA: Insufficient documentation

## 2015-10-19 DIAGNOSIS — E785 Hyperlipidemia, unspecified: Secondary | ICD-10-CM | POA: Insufficient documentation

## 2015-10-19 DIAGNOSIS — R0602 Shortness of breath: Secondary | ICD-10-CM | POA: Diagnosis not present

## 2015-10-19 DIAGNOSIS — R0789 Other chest pain: Principal | ICD-10-CM | POA: Insufficient documentation

## 2015-10-19 DIAGNOSIS — I10 Essential (primary) hypertension: Secondary | ICD-10-CM | POA: Diagnosis not present

## 2015-10-19 DIAGNOSIS — Z885 Allergy status to narcotic agent status: Secondary | ICD-10-CM | POA: Insufficient documentation

## 2015-10-19 DIAGNOSIS — Z951 Presence of aortocoronary bypass graft: Secondary | ICD-10-CM | POA: Insufficient documentation

## 2015-10-19 DIAGNOSIS — I480 Paroxysmal atrial fibrillation: Secondary | ICD-10-CM | POA: Insufficient documentation

## 2015-10-19 DIAGNOSIS — Z841 Family history of disorders of kidney and ureter: Secondary | ICD-10-CM | POA: Insufficient documentation

## 2015-10-19 DIAGNOSIS — Z7901 Long term (current) use of anticoagulants: Secondary | ICD-10-CM | POA: Insufficient documentation

## 2015-10-19 DIAGNOSIS — Z9862 Peripheral vascular angioplasty status: Secondary | ICD-10-CM | POA: Insufficient documentation

## 2015-10-19 DIAGNOSIS — H409 Unspecified glaucoma: Secondary | ICD-10-CM | POA: Insufficient documentation

## 2015-10-19 DIAGNOSIS — Z79899 Other long term (current) drug therapy: Secondary | ICD-10-CM | POA: Insufficient documentation

## 2015-10-19 DIAGNOSIS — Z809 Family history of malignant neoplasm, unspecified: Secondary | ICD-10-CM | POA: Diagnosis not present

## 2015-10-19 DIAGNOSIS — R001 Bradycardia, unspecified: Secondary | ICD-10-CM

## 2015-10-19 DIAGNOSIS — K449 Diaphragmatic hernia without obstruction or gangrene: Secondary | ICD-10-CM | POA: Insufficient documentation

## 2015-10-19 DIAGNOSIS — Z800 Family history of malignant neoplasm of digestive organs: Secondary | ICD-10-CM | POA: Diagnosis not present

## 2015-10-19 DIAGNOSIS — I2510 Atherosclerotic heart disease of native coronary artery without angina pectoris: Secondary | ICD-10-CM | POA: Insufficient documentation

## 2015-10-19 DIAGNOSIS — K219 Gastro-esophageal reflux disease without esophagitis: Secondary | ICD-10-CM | POA: Diagnosis not present

## 2015-10-19 DIAGNOSIS — D320 Benign neoplasm of cerebral meninges: Secondary | ICD-10-CM | POA: Diagnosis not present

## 2015-10-19 DIAGNOSIS — N400 Enlarged prostate without lower urinary tract symptoms: Secondary | ICD-10-CM | POA: Insufficient documentation

## 2015-10-19 LAB — COMPREHENSIVE METABOLIC PANEL
ALBUMIN: 3.9 g/dL (ref 3.5–5.0)
ALK PHOS: 112 U/L (ref 38–126)
ALT: 43 U/L (ref 17–63)
AST: 71 U/L — AB (ref 15–41)
Anion gap: 4 — ABNORMAL LOW (ref 5–15)
BILIRUBIN TOTAL: 0.6 mg/dL (ref 0.3–1.2)
BUN: 19 mg/dL (ref 6–20)
CALCIUM: 9.6 mg/dL (ref 8.9–10.3)
CO2: 26 mmol/L (ref 22–32)
Chloride: 109 mmol/L (ref 101–111)
Creatinine, Ser: 1.32 mg/dL — ABNORMAL HIGH (ref 0.61–1.24)
GFR calc Af Amer: 59 mL/min — ABNORMAL LOW (ref >60)
GFR calc non Af Amer: 51 mL/min — ABNORMAL LOW (ref >60)
GLUCOSE: 73 mg/dL (ref 65–99)
Potassium: 3.9 mmol/L (ref 3.5–5.1)
Sodium: 139 mmol/L (ref 135–145)
Total Protein: 7.1 g/dL (ref 6.5–8.1)

## 2015-10-19 LAB — TROPONIN I
Troponin I: <0.03 ng/mL (ref <0.031)
Troponin I: <0.03 ng/mL (ref <0.031)
Troponin I: <0.03 ng/mL (ref <0.031)

## 2015-10-19 LAB — CBC
HEMATOCRIT: 45 % (ref 40.0–52.0)
HEMOGLOBIN: 15.4 g/dL (ref 13.0–18.0)
MCH: 33.3 pg (ref 26.0–34.0)
MCHC: 34.3 g/dL (ref 32.0–36.0)
MCV: 97.3 fL (ref 80.0–100.0)
Platelets: 218 10*3/uL (ref 150–440)
RBC: 4.62 MIL/uL (ref 4.40–5.90)
RDW: 13.2 % (ref 11.5–14.5)
WBC: 8.7 10*3/uL (ref 3.8–10.6)

## 2015-10-19 LAB — BRAIN NATRIURETIC PEPTIDE: B Natriuretic Peptide: 101 pg/mL — ABNORMAL HIGH (ref 0.0–100.0)

## 2015-10-19 LAB — PROTIME-INR
INR: 1.12
PROTHROMBIN TIME: 14.6 s (ref 11.4–15.0)

## 2015-10-19 MED ORDER — ASPIRIN EC 81 MG PO TBEC: 81.0000 mg | DELAYED_RELEASE_TABLET | Freq: Every day | ORAL | Status: DC

## 2015-10-19 MED ORDER — FLUTICASONE PROPIONATE 50 MCG/ACT NA SUSP
2.0000 | Freq: Every day | NASAL | Status: DC
  Filled 2015-10-19: qty 16

## 2015-10-19 MED ORDER — VITAMIN B-12 100 MCG PO TABS
100.0000 ug | ORAL_TABLET | Freq: Every day | ORAL | Status: DC
  Administered 2015-10-20: 100 ug via ORAL
  Filled 2015-10-19 (×2): qty 1

## 2015-10-19 MED ORDER — NITROGLYCERIN 2 % TD OINT
1.0000 [in_us] | TOPICAL_OINTMENT | Freq: Once | TRANSDERMAL | Status: AC
  Administered 2015-10-19: 1 [in_us] via TOPICAL
  Filled 2015-10-19: qty 1

## 2015-10-19 MED ORDER — SODIUM CHLORIDE 0.9 % IV SOLN
Freq: Once | INTRAVENOUS | Status: AC
  Administered 2015-10-19: 12:00:00 via INTRAVENOUS

## 2015-10-19 MED ORDER — ENOXAPARIN SODIUM 40 MG/0.4ML ~~LOC~~ SOLN
40.0000 mg | SUBCUTANEOUS | Status: DC
  Administered 2015-10-19: 40 mg via SUBCUTANEOUS
  Filled 2015-10-19: qty 0.4

## 2015-10-19 MED ORDER — TIMOLOL HEMIHYDRATE 0.25 % OP SOLN
1.0000 [drp] | Freq: Every day | OPHTHALMIC | Status: DC
  Administered 2015-10-20: 1 [drp] via OPHTHALMIC
  Filled 2015-10-19: qty 5

## 2015-10-19 MED ORDER — DUTASTERIDE 0.5 MG PO CAPS
0.5000 mg | ORAL_CAPSULE | Freq: Every day | ORAL | Status: DC
  Administered 2015-10-20: 0.5 mg via ORAL
  Filled 2015-10-19: qty 1

## 2015-10-19 MED ORDER — ENOXAPARIN SODIUM 40 MG/0.4ML ~~LOC~~ SOLN: 40.0000 mg | SUBCUTANEOUS | Status: DC

## 2015-10-19 MED ORDER — ASPIRIN EC 81 MG PO TBEC
81.0000 mg | DELAYED_RELEASE_TABLET | Freq: Every day | ORAL | Status: DC
  Administered 2015-10-20: 81 mg via ORAL
  Filled 2015-10-19: qty 1

## 2015-10-19 MED ORDER — CHLORHEXIDINE GLUCONATE 0.12 % MT SOLN
15.0000 mL | Freq: Two times a day (BID) | OROMUCOSAL | Status: DC
  Administered 2015-10-19 – 2015-10-20 (×2): 15 mL via OROMUCOSAL
  Filled 2015-10-19 (×2): qty 15

## 2015-10-19 MED ORDER — ASPIRIN 81 MG PO CHEW
324.0000 mg | CHEWABLE_TABLET | Freq: Once | ORAL | Status: AC
  Administered 2015-10-19: 324 mg via ORAL
  Filled 2015-10-19: qty 4

## 2015-10-19 MED ORDER — HYDROCORTISONE ACETATE 25 MG RE SUPP: 25.0000 mg | Freq: Two times a day (BID) | RECTAL | Status: DC

## 2015-10-19 MED ORDER — ACETAMINOPHEN 650 MG RE SUPP: 650.0000 mg | Freq: Four times a day (QID) | RECTAL | Status: DC | PRN

## 2015-10-19 MED ORDER — ONDANSETRON HCL 4 MG/2ML IJ SOLN
4.0000 mg | Freq: Four times a day (QID) | INTRAMUSCULAR | Status: DC | PRN
Start: 2015-10-19 — End: 2015-10-20

## 2015-10-19 MED ORDER — ONDANSETRON HCL 4 MG PO TABS: 4.0000 mg | ORAL_TABLET | Freq: Four times a day (QID) | ORAL | Status: DC | PRN

## 2015-10-19 MED ORDER — TRAMADOL HCL 50 MG PO TABS
50.0000 mg | ORAL_TABLET | ORAL | Status: DC | PRN
Start: 2015-10-19 — End: 2015-10-19

## 2015-10-19 MED ORDER — NITROGLYCERIN 2 % TD OINT: 0.5000 [in_us] | TOPICAL_OINTMENT | Freq: Four times a day (QID) | TRANSDERMAL | Status: DC

## 2015-10-19 MED ORDER — HEPARIN SODIUM (PORCINE) 5000 UNIT/ML IJ SOLN: 5000.0000 [IU] | Freq: Three times a day (TID) | INTRAMUSCULAR | Status: DC

## 2015-10-19 MED ORDER — PANTOPRAZOLE SODIUM 40 MG PO TBEC
40.0000 mg | DELAYED_RELEASE_TABLET | Freq: Every day | ORAL | Status: DC
  Filled 2015-10-19: qty 1

## 2015-10-19 MED ORDER — ACETAMINOPHEN 325 MG PO TABS
650.0000 mg | ORAL_TABLET | Freq: Four times a day (QID) | ORAL | Status: DC | PRN
Start: 2015-10-19 — End: 2015-10-20
  Administered 2015-10-20: 650 mg via ORAL
  Filled 2015-10-19: qty 2

## 2015-10-19 MED ORDER — ATORVASTATIN CALCIUM 20 MG PO TABS
20.0000 mg | ORAL_TABLET | Freq: Every day | ORAL | Status: DC
  Administered 2015-10-20: 20 mg via ORAL
  Filled 2015-10-19 (×2): qty 1

## 2015-10-19 NOTE — Progress Notes (Signed)
Room air. NSR. Pt reports no pain. HOH. Wife at the bedside. Pt has no further concerns  At this time.

## 2015-10-19 NOTE — ED Notes (Signed)
Reports chest pain.  Denies sob. Pale and diaphoretic on arrival.  A&O

## 2015-10-19 NOTE — Consult Note (Signed)
[facility]  CARDIOLOGY CONSULT NOTE  Patient ID: TENOR CASA MRN: [id number] DOB/AGE: 12/16/1940 75 y.o.  Admit date: 10/19/2015 Referring Physician Dr. Vianne Bulls Primary Physician Dr. Ellison Hughs Primary Cardiologist Dr. Nehemiah Massed Reason for Consultation atypical chest pain  HPI:     Pt is a 75 yo male with history of coronary artery disease s/p coronary artery bypass grafting x4 with the left internal mammary to the left anterior descending coronary artery, reverse saphenous vein graft to the intermediate coronary artery, reverse saphenous vein graft to the first obtuse marginal coronary artery, reverse saphenous vein graft to the posterior descending coronary artery in 7/16. He has been doing well since then other than episodes of dipharesis and abdominal discomfort. Discomfort was different than his angina. He has ruled out for an mi. He is currently pain free. He states the pain was epigastric with associated nausea and diapharesis. He was note to have sinus bradycardia on ekg during pain episode consistant with vagal effect. He has been compliant with meds.   Review of Systems  Constitutional: Positive for diaphoresis.  HENT: Negative.   Eyes: Negative.   Respiratory: Negative.   Cardiovascular: Positive for chest pain.  Gastrointestinal: Positive for heartburn, nausea and abdominal pain.  Genitourinary: Negative.   Musculoskeletal: Negative.   Skin: Negative.   Neurological: Negative.   Endo/Heme/Allergies: Negative.   Psychiatric/Behavioral: Negative.     Past Medical History  Diagnosis Date  . Coronary artery disease   . Meningioma ([place])     stable, followed by yearly MRIs  . History of hiatal hernia 2012  . Essential hypertension 06/05/2014  . Hyperlipidemia   . BPH (benign prostatic hyperplasia)   . Cataracts, bilateral   . Hemorrhoids   . Migraines   . RBBB   . Stomach ulcer   . GI bleed     a. ~2012 around time of hiatal  hernia surgery. 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  Marland Kitchen History of chicken pox   . Glaucoma   . GERD (gastroesophageal reflux disease)     Barretts esophagus    Family History  Problem Relation Age of Onset  . Parkinsonism Mother   . Kidney disease Father   . Cancer Father     prostate  . Heart disease Father   . Cancer Paternal Uncle     esophageal  . Cancer Paternal Uncle     Social History   Social History  . Marital Status: Married    Spouse Name: N/A  . Number of Children: N/A  . Years of Education: N/A   Occupational History  . Not on file.   Social History Main Topics  . Smoking status: Never Smoker   . Smokeless tobacco: Never Used  . Alcohol Use: No  . Drug Use: No  . Sexual Activity: Not on file   Other Topics Concern  . Not on file   Social History Narrative    Past Surgical History  Procedure Laterality Date  . Cardiac surgery      coronary angioplasty  . Cholecystectomy    . Hiatal hernia repair N/A   . Cardiac catheterization N/A 01/07/2015    Procedure: Left Heart Cath;  Surgeon: Corey Skains, MD;  Location: [facility];  Service: Cardiovascular;  Laterality: N/A;  . Colonoscopy  11/03/08, 03/09/14  . Esophagogastroduodenoscopy  02/19/13  . Esophagus surgery    . Coronary angioplasty    . Atherectomy  1992  .  Diagnostic laparoscopy      cholecystectomy and hiatal hernia repair  . Eye surgery      cataracts BIL  . Hernia repair    . Coronary artery bypass graft N/A 01/19/2015    Procedure: CORONARY ARTERY BYPASS GRAFTING (CABG), with LIM to LAD, vein to left intermediate, vein to PD, vein to OM, using right greater saphenous vein via endovein harvest.;  Surgeon: Grace Isaac, MD;  Location: [facility];  Service: Open Heart Surgery;  Laterality: N/A;  . Tee without cardioversion N/A 01/19/2015    Procedure: TRANSESOPHAGEAL ECHOCARDIOGRAM (TEE);  Surgeon: Grace Isaac, MD;  Location: [facility];  Service: Open  Heart Surgery;  Laterality: N/A;  . Endovein harvest of greater saphenous vein Right 01/19/2015    Procedure: ENDOVEIN HARVEST OF GREATER SAPHENOUS VEIN;  Surgeon: Grace Isaac, MD;  Location: [facility];  Service: Open Heart Surgery;  Laterality: Right;  . Esophagogastroduodenoscopy (egd) with propofol N/A 08/09/2015    Procedure: ESOPHAGOGASTRODUODENOSCOPY (EGD) WITH PROPOFOL;  Surgeon: Hulen Luster, MD;  Location: [facility];  Service: Gastroenterology;  Laterality: N/A;     Prescriptions prior to admission  Medication Sig Dispense Refill Last Dose  . aspirin 81 MG tablet Take 81 mg by mouth daily.   10/18/2015 at am  . atorvastatin (LIPITOR) 20 MG tablet Take 20 mg by mouth daily.   10/18/2015 at Unknown time  . cetirizine (ZYRTEC) 5 MG tablet Take 5 mg by mouth daily.   10/18/2015 at Unknown time  . chlorhexidine (PERIDEX) 0.12 % solution Use as directed 15 mLs in the mouth or throat 2 (two) times daily.   10/18/2015 at Unknown time  . cyanocobalamin 100 MCG tablet Take 100 mcg by mouth daily.   10/18/2015 at Unknown time  . dutasteride (AVODART) 0.5 MG capsule Take 0.5 mg by mouth daily.   10/18/2015 at Unknown time  . hydrocortisone cream 1 % Apply 1 application topically as needed for itching.   prn at prn  . latanoprost (XALATAN) 0.005 % ophthalmic solution Place 1 drop into both eyes at bedtime.   10/18/2015 at Unknown time  . nitroGLYCERIN (NITROSTAT) 0.4 MG SL tablet Place 0.4 mg under the tongue every 5 (five) minutes as needed for chest pain.   prn at prn  . omeprazole (PRILOSEC) 20 MG capsule Take 20 mg by mouth daily.   10/18/2015 at Unknown time  . timolol (BETIMOL) 0.25 % ophthalmic solution Place 1 drop into both eyes daily.   10/18/2015 at Unknown time  . triamcinolone cream (KENALOG) 0.1 % Apply 1 application topically 2 (two) times daily as needed (Foot).   prn at prn    Physical Exam: Blood pressure 165/90, pulse 58, temperature 97.5 F (36.4 C), temperature source Oral, resp. rate 16,  height 6\' 3"  (1.905 m), weight 98.884 kg (218 lb), SpO2 98 %.   Wt Readings from Last 1 Encounters:  10/19/15 98.884 kg (218 lb)     General appearance: alert and cooperative Resp: clear to auscultation bilaterally Cardio: regular rate and rhythm GI: normal findings: bowel sounds normal and umbilicus normal and abnormal findings:  mild tenderness in the epigastrium Neurologic: Grossly normal  Labs:   Lab Results  Component Value Date   WBC 8.7 10/19/2015   HGB 15.4 10/19/2015   HCT 45.0 10/19/2015   MCV 97.3 10/19/2015   PLT 218 10/19/2015    Recent Labs Lab 10/19/15 1158  NA 139  K 3.9  CL 109  CO2 26  BUN 19  CREATININE 1.32*  CALCIUM 9.6  PROT 7.1  BILITOT 0.6  ALKPHOS 112  ALT 43  AST 71*  GLUCOSE 73   Lab Results  Component Value Date   CKTOTAL 84 10/11/2011   CKMB 1.3 10/11/2011   TROPONINI <0.03 10/19/2015      Radiology: no acute process EKG: nsr with no ischemia ASSESSMENT AND PLAN:  Pt with history of cad s/p cabg in 7/16 now with epigastric discomfort which is somewhat atypcial for his angina. Has ruled out for mi . Pain was c/w possible gi source. He is s/p cholecystectomy. No gi bleed . Bradycardia is likely secondary to vagal effect with his pain. Willl follow over night and consider funcitonal study in am.  Signed: Teodoro Spray MD, [facility] 10/19/2015, 4:08 PM

## 2015-10-19 NOTE — ED Provider Notes (Signed)
[facility] Emergency Department Provider Note     Time seen: ----------------------------------------- 11:50 AM on 10/19/2015 -----------------------------------------    I have reviewed the triage vital signs and the nursing notes.   HISTORY  Chief Complaint Chest Pain    HPI Bradley [name] is a 75 y.o. male who drove himself to the ER for chest pain. Patient describes a vague pain across most of his chest and upper abdomen that occurred approximately 30 minutes prior to arrival. He's had profuse diaphoresis, nothing has made his symptoms better or worse. Patient has not had a history of chest pain since his cardiac bypass. Patient denies any recent illness, denies changes in his medicines. Patient states it felt like a really bad case of heartburn.   Past Medical History  Diagnosis Date  . Coronary artery disease   . Meningioma ([place])     stable, followed by yearly MRIs  . History of hiatal hernia 2012  . Essential hypertension 06/05/2014  . Hyperlipidemia   . BPH (benign prostatic hyperplasia)   . Cataracts, bilateral   . Hemorrhoids   . Migraines   . RBBB   . Stomach ulcer   . GI bleed     a. ~2012 around time of hiatal hernia surgery. 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  Marland Kitchen History of chicken pox   . Glaucoma   . GERD (gastroesophageal reflux disease)     Barretts esophagus    Patient Active Problem List   Diagnosis Date Noted  . Atrial fibrillation ([place]) 01/30/2015  . 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 Stable angina ([place]) 01/07/2015  . Arteriosclerosis of coronary artery 12/10/2014  . Benign essential HTN 06/05/2014  . Chest pain 01/12/2014  . Angina pectoris ([place]) 10/09/2013  . 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 Benign prostatic hyperplasia with urinary obstruction 04/01/2012  . 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 Cardiac surgery      coronary angioplasty  . Cholecystectomy    . Hiatal hernia repair N/A   . Cardiac catheterization N/A 01/07/2015    Procedure: Left Heart Cath;  Surgeon: Corey Skains, MD;  Location: [facility];  Service: Cardiovascular;  Laterality: N/A;  . Colonoscopy  11/03/08, 03/09/14  . Esophagogastroduodenoscopy  02/19/13  . Esophagus surgery    . Coronary angioplasty    . Atherectomy  1992  . Diagnostic laparoscopy      cholecystectomy and hiatal hernia repair  . Eye surgery      cataracts BIL  . Hernia repair    . Coronary artery bypass graft N/A 01/19/2015    Procedure: CORONARY ARTERY BYPASS GRAFTING (CABG), with LIM to LAD, vein to left intermediate, vein to PD, vein to OM, using right greater saphenous vein via endovein harvest.;  Surgeon: Grace Isaac, MD;  Location: [facility];  Service: Open Heart Surgery;  Laterality: N/A;  . Tee without cardioversion N/A 01/19/2015    Procedure: TRANSESOPHAGEAL ECHOCARDIOGRAM (TEE);  Surgeon: Grace Isaac, MD;  Location: [facility];  Service: Open Heart Surgery;  Laterality: N/A;  . Endovein harvest of greater saphenous vein Right 01/19/2015    Procedure: ENDOVEIN HARVEST OF GREATER SAPHENOUS VEIN;  Surgeon: Grace Isaac, MD;  Location: [facility];  Service: Open Heart Surgery;  Laterality: Right;  . Esophagogastroduodenoscopy (egd) with propofol N/A 08/09/2015    Procedure:  ESOPHAGOGASTRODUODENOSCOPY (EGD) WITH PROPOFOL;  Surgeon: Hulen Luster, MD;  Location[facility];  Service: Gastroenterology;  Laterality: N/A;    Allergies Oxycodone and Sulfa antibiotics  Social History Social History  Substance Use Topics  . Smoking status: Never Smoker   . Smokeless tobacco: Never Used  . Alcohol Use: No    Review of Systems Constitutional: Negative for fever. Eyes: Negative for visual changes. ENT: Negative for sore throat. Cardiovascular: Positive for chest pain Respiratory: Negative for  shortness of breath. Gastrointestinal: Positive for upper abdominal pain Genitourinary: Negative for dysuria. Musculoskeletal: Negative for back pain. Skin: Positive for diaphoresis Neurological: Negative for headaches, focal weakness or numbness.  10-point ROS otherwise negative.  ____________________________________________   PHYSICAL EXAM:  VITAL SIGNS: ED Triage Vitals  Enc Vitals Group     BP --      Pulse --      Resp --      Temp --      Temp src --      SpO2 --      Weight --      Height --      Head Cir --      Peak Flow --      Pain Score --      Pain Loc --      Pain Edu? --      Excl. in [place]? --     Constitutional: Alert and oriented. Mild to moderate distress Eyes: Conjunctivae are normal. PERRL. Normal extraocular movements. ENT   Head: Normocephalic and atraumatic.   Nose: No congestion/rhinnorhea.   Mouth/Throat: Mucous membranes are moist.   Neck: No stridor. Cardiovascular: Slow rate regular rhythm. Normal and symmetric distal pulses are present in all extremities.  Respiratory: Normal respiratory effort without tachypnea nor retractions. Breath sounds are clear and equal bilaterally. No wheezes/rales/rhonchi. Gastrointestinal: Soft and nontender. No distention. No abdominal bruits.  Musculoskeletal: Nontender with normal range of motion in all extremities. No joint effusions.  No lower extremity tenderness nor edema. Neurologic:  Normal speech and language. No gross focal neurologic deficits are appreciated.  Skin:  Skin is warm, with profuse diaphoresis Psychiatric: Mood and affect are normal. Speech and behavior are normal. Patient exhibits appropriate insight and judgment. ____________________________________________  EKG: Interpreted by me. Sinus bradycardia with a rate of 37 bpm, normal PR interval, wide QRS, normal QT interval. Right bundle-branch block, normal axis.  ____________________________________________  ED  COURSE:  Pertinent labs & imaging results that were available during my care of the patient were reviewed by me and considered in my medical decision making (see chart for details). Patient presents diaphoretic with chest pain and bradycardia. Indeterminate etiology at this time. We'll check basic labs, give aspirin nitroglycerin and reevaluate. Patient will likely need admission ____________________________________________    LABS (pertinent positives/negatives)  Labs Reviewed  COMPREHENSIVE METABOLIC PANEL - Abnormal; Notable for the following:    Creatinine, Ser 1.32 (*)    AST 71 (*)    GFR calc non Af Amer 51 (*)    GFR calc Af Amer 59 (*)    Anion gap 4 (*)    All other components within normal limits  BRAIN NATRIURETIC PEPTIDE - Abnormal; Notable for the following:    B Natriuretic Peptide 101.0 (*)    All other components within normal limits  CBC  TROPONIN I  PROTIME-INR    RADIOLOGY Chest x-ray IMPRESSION: Cardiomegaly without edema. Bibasilar atelectasis. ____________________________________________  FINAL ASSESSMENT AND PLAN  Chest pain  Plan: Patient with labs and imaging as dictated above. Patient presented with dramatic chest pain, diaphoresis and bradycardia. Initial workup to this point has been unremarkable. Current heart rate is around 55 bpm. He received aspirin and nitroglycerin. We will discuss with the hospitalist for admission, possible stress testing.   Earleen Newport, MD   Earleen Newport, MD 10/19/15 1257

## 2015-10-19 NOTE — H&P (Signed)
[facility] [place] NAME: Bradley [name]    MR#:  [id number]  DATE OF BIRTH:  1941-06-26  DATE OF ADMISSION:  10/19/2015  PRIMARY CARE PHYSICIAN: Sofie Hartigan, MD   REQUESTING/REFERRING PHYSICIAN: Lenise Arena  CHIEF COMPLAINT: Chest pain    Chief Complaint  Patient presents with  . Chest Pain    HISTORY OF PRESENT ILLNESS:  Bradley [name]  is a 75 y.o. male with a known history hypertension hypertension, diastolic heart failure, BPH, GERD, hyperlipidemia came in because of chest pain. Patient says that chest pain started this morning after the breakfast. And he says that chest pain is all over the chest associated with shortness of breath, diaphoresis, nausea. Patient came to the emergency room he was given aspirin, Nitropaste which helped him with relieving the pain. Patient says no radiation of pain no aggravating factors. And noted to have bradycardia with heart rate as low as 32 with the emergency room. Patient did have similar symptoms a few weeks ago and the thought to have reflux symptoms. Patient follows up with Dr. Nehemiah Massed. He has history of bypass surgery July last year,. He mentions that he is having hiccups lately a lot. A set of troponins are negative. Because of history of  coronary artery disease and bypass with chest pain we are going to place in observation, obtain cardiology consult, cycle the troponins.  PAST MEDICAL HISTORY:   Past Medical History  Diagnosis Date  . Coronary artery disease   . Meningioma ([place])     stable, followed by yearly MRIs  . History of hiatal hernia 2012  . Essential hypertension 06/05/2014  . Hyperlipidemia   . BPH (benign prostatic hyperplasia)   . Cataracts, bilateral   . Hemorrhoids   . Migraines   . RBBB   . Stomach ulcer   . GI bleed     a. ~2012 around time of hiatal hernia surgery. 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  Marland Kitchen History of chicken pox    . Glaucoma   . GERD (gastroesophageal reflux disease)     Barretts esophagus    PAST SURGICAL HISTOIRY:   Past Surgical History  Procedure Laterality Date  . Cardiac surgery      coronary angioplasty  . Cholecystectomy    . Hiatal hernia repair N/A   . Cardiac catheterization N/A 01/07/2015    Procedure: Left Heart Cath;  Surgeon: Corey Skains, MD;  Location: [place] [facility] [facility];  Service: Cardiovascular;  Laterality: N/A;  . Colonoscopy  11/03/08, 03/09/14  . Esophagogastroduodenoscopy  02/19/13  . Esophagus surgery    . Coronary angioplasty    . Atherectomy  1992  . Diagnostic laparoscopy      cholecystectomy and hiatal hernia repair  . Eye surgery      cataracts BIL  . Hernia repair    . Coronary artery bypass graft N/A 01/19/2015    Procedure: CORONARY ARTERY BYPASS GRAFTING (CABG), with LIM to LAD, vein to left intermediate, vein to PD, vein to OM, using right greater saphenous vein via endovein harvest.;  Surgeon: Grace Isaac, MD;  Location: [facility];  Service: Open Heart Surgery;  Laterality: N/A;  . Tee without cardioversion N/A 01/19/2015    Procedure: TRANSESOPHAGEAL ECHOCARDIOGRAM (TEE);  Surgeon: Grace Isaac, MD;  Location: [place];  Service: Open Heart Surgery;  Laterality: N/A;  . Endovein harvest of greater saphenous vein Right 01/19/2015    Procedure: ENDOVEIN  HARVEST OF GREATER SAPHENOUS VEIN;  Surgeon: Grace Isaac, MD;  Location: [place];  Service: Open Heart Surgery;  Laterality: Right;  . Esophagogastroduodenoscopy (egd) with propofol N/A 08/09/2015    Procedure: ESOPHAGOGASTRODUODENOSCOPY (EGD) WITH PROPOFOL;  Surgeon: Hulen Luster, MD;  Location: [facility];  Service: Gastroenterology;  Laterality: N/A;    SOCIAL HISTORY:   Social History  Substance Use Topics  . Smoking status: Never Smoker   . Smokeless tobacco: Never Used  . Alcohol Use: No    FAMILY HISTORY:   Family History  Problem Relation Age of Onset  . Parkinsonism Mother   .  Kidney disease Father   . Cancer Father     prostate  . Heart disease Father   . Cancer Paternal Uncle     esophageal  . Cancer Paternal Uncle     DRUG ALLERGIES:   Allergies  Allergen Reactions  . Oxycodone Other (See Comments)    hypotention  . Sulfa Antibiotics Rash    REVIEW OF SYSTEMS:  CONSTITUTIONAL: No fever, fatigue or weakness.  EYES: No blurred or double vision.  EARS, NOSE, AND THROAT: No tinnitus or ear pain.  RESPIRATORY: No cough, shortness of breath, wheezing or hemoptysis.  CARDIOVASCULAR: Had chest pain this morning. GASTROINTESTINAL: No nausea, vomiting, diarrhea or abdominal pain.  GENITOURINARY: No dysuria, hematuria.  ENDOCRINE: No polyuria, nocturia,  HEMATOLOGY: No anemia, easy bruising or bleeding SKIN: No rash or lesion. MUSCULOSKELETAL: No joint pain or arthritis.   NEUROLOGIC: No tingling, numbness, weakness.  PSYCHIATRY: No anxiety or depression.   MEDICATIONS AT HOME:   Prior to Admission medications   Medication Sig Start Date End Date Taking? Authorizing Provider  amiodarone (PACERONE) 200 MG tablet Take by mouth daily.  01/30/15   Historical Provider, MD  aspirin 81 MG tablet Take 81 mg by mouth daily.    Historical Provider, MD  atorvastatin (LIPITOR) 20 MG tablet Take 20 mg by mouth daily.    Historical Provider, MD  benzocaine (AMERICAINE) 20 % rectal ointment Place rectally every 3 (three) hours as needed for pain. 02/06/15   Sherlene Shams, MD  cetirizine (ZYRTEC) 5 MG tablet Take 5 mg by mouth daily.    Historical Provider, MD  chlorhexidine (PERIDEX) 0.12 % solution Use as directed 15 mLs in the mouth or throat 2 (two) times daily.    Historical Provider, MD  cyanocobalamin 100 MCG tablet Take 100 mcg by mouth daily.    Historical Provider, MD  dutasteride (AVODART) 0.5 MG capsule Take 0.5 mg by mouth daily.    Historical Provider, MD  fluticasone (FLONASE) 50 MCG/ACT nasal spray Place 2 sprays into both nostrils daily.    Historical  Provider, MD  hydrocortisone (ANUSOL-HC) 25 MG suppository Place 25 mg rectally 2 (two) times daily.    Historical Provider, MD  latanoprost (XALATAN) 0.005 % ophthalmic solution Place 1 drop into both eyes at bedtime.    Historical Provider, MD  nitroGLYCERIN (NITROSTAT) 0.4 MG SL tablet Place 0.4 mg under the tongue every 5 (five) minutes as needed for chest pain.    Historical Provider, MD  omeprazole (PRILOSEC) 40 MG capsule Take 40 mg by mouth daily.    Historical Provider, MD  timolol (BETIMOL) 0.25 % ophthalmic solution Place 1 drop into both eyes daily.    Historical Provider, MD  timolol (TIMOPTIC) 0.25 % ophthalmic solution 1 drop 2 (two) times daily.    Historical Provider, MD  traMADol (ULTRAM) 50 MG tablet Take 1-2 tablets (  50-100 mg total) by mouth every 4 (four) hours as needed for moderate pain. 01/30/15   [name] R Barrett, PA-C  triamcinolone cream (KENALOG) 0.1 % Apply 1 application topically 2 (two) times daily as needed (Foot).    Historical Provider, MD  warfarin (COUMADIN) 5 MG tablet Take 1 tablet (5 mg total) by mouth daily at 6 PM. Patient not taking: Reported on 03/24/2015 01/30/15   [name] R Barrett, PA-C      VITAL SIGNS:  Blood pressure 112/70, pulse 53, temperature 97.4 F (36.3 C), temperature source Oral, resp. rate 15, height 6\' 3"  (1.905 m), weight 98.884 kg (218 lb), SpO2 99 %.  PHYSICAL EXAMINATION:  GENERAL:  75 y.o.-year-old patient lying in the bed with no acute distress.  EYES: Pupils equal, round, reactive to light and accommodation. No scleral icterus. Extraocular muscles intact.  HEENT: Head atraumatic, normocephalic. Oropharynx and nasopharynx clear.  NECK:  Supple, no jugular venous distention. No thyroid enlargement, no tenderness.  LUNGS: Normal breath sounds bilaterally, no wheezing, rales,rhonchi or crepitation. No use of accessory muscles of respiration.  CARDIOVASCULAR: S1, S2 normal. No murmurs, rubs, or gallops CABG scars present in the anterior  right chest in the midline. ABDOMEN: Soft, nontender, nondistended. Bowel sounds present. No organomegaly or mass.  EXTREMITIES: No pedal edema, cyanosis, or clubbing.  NEUROLOGIC: Cranial nerves II through XII are intact. Muscle strength 5/5 in all extremities. Sensation intact. Gait not checked.  PSYCHIATRIC: The patient is alert and oriented x 3.  SKIN: No obvious rash, lesion, or ulcer.   LABORATORY PANEL:   CBC  Recent Labs Lab 10/19/15 1158  WBC 8.7  HGB 15.4  HCT 45.0  PLT 218   ------------------------------------------------------------------------------------------------------------------  Chemistries   Recent Labs Lab 10/19/15 1158  NA 139  K 3.9  CL 109  CO2 26  GLUCOSE 73  BUN 19  CREATININE 1.32*  CALCIUM 9.6  AST 71*  ALT 43  ALKPHOS 112  BILITOT 0.6   ------------------------------------------------------------------------------------------------------------------  Cardiac Enzymes  Recent Labs Lab 10/19/15 1158  TROPONINI <0.03   ------------------------------------------------------------------------------------------------------------------  RADIOLOGY:  Dg Chest Port 1 View  10/19/2015  CLINICAL DATA:  Chest pain this morning EXAM: PORTABLE CHEST 1 VIEW COMPARISON:  03/04/2015 FINDINGS: Mild cardiomegaly. Bibasilar atelectasis. Upper lungs clear. Normal vascularity. Postoperative changes. No pneumothorax. IMPRESSION: Cardiomegaly without edema.  Bibasilar atelectasis. Electronically Signed   By: Marybelle Killings M.D.   On: 10/19/2015 12:17    EKG:   Orders placed or performed during the hospital encounter of 10/19/15  . EKG 12-Lead  . EKG 12-Lead  . ED EKG  . ED EKG   EKG shows moderate sinus bradycardia 37 bpm, with right bundle branch block and T-wave inversion in V1 and V2   IMPRESSION AND PLAN:  #1 chest pain likely angina: Has history of coronary artery disease and CABG last year. Place the patient in observation, cycle the troponins  2 times, continue aspirin, nitroglycerin, statins. Unable to use beta blockers because of bradycardia. Cardiology consult with Dr. Nehemiah Massed, continue Lovenox.  Patient may need stress test. #2 history of BPH: On Avodart continue that. #3 GERD continue PPIs. #4 Hyperlipidemia: Continue atorvastatin 40 MG by mouth daily Glaucoma continue eyedrops. #5 Essential hypertension: Unable to use beta blockers due to bradycardia. Blood pressure is soft so monitor closely. 6.. proximal atrial fibrillation: Was on amiodarone but ,we stopped because of bradycardia.    All the records are reviewed and case discussed with ED provider. Management plans discussed with the  patient, family and they are in agreement.  CODE STATUS:full  TOTAL TIME TAKING CARE OF THIS PATIENT:55 minutes.    Epifanio Lesches M.D on 10/19/2015 at 1:41 PM  Between 7am to 6pm - Pager - [phone]  After 6pm go to [email or web address] - password [email or web address] [facility]  Office  [phone]  CC: Primary care physician; [facility], Chrissie Noa, MD  Note: This dictation was prepared with Dragon dictation along with smaller phrase technology. Any transcriptional errors that result from this process are unintentional.

## 2015-10-20 ENCOUNTER — Encounter: Payer: Self-pay | Admitting: Radiology

## 2015-10-20 ENCOUNTER — Observation Stay: Payer: Medicare Other

## 2015-10-20 DIAGNOSIS — R0789 Other chest pain: Secondary | ICD-10-CM | POA: Diagnosis not present

## 2015-10-20 LAB — NM MYOCAR MULTI W/SPECT W/WALL MOTION / EF
CHL CUP NUCLEAR SDS: 2
CHL CUP RESTING HR STRESS: 93 {beats}/min
CHL CUP STRESS STAGE 2 GRADE: 0 %
CHL CUP STRESS STAGE 2 HR: 71 {beats}/min
CHL CUP STRESS STAGE 2 SPEED: 0.1 mph
CHL CUP STRESS STAGE 3 GRADE: 0 %
CHL CUP STRESS STAGE 3 HR: 71 {beats}/min
CHL CUP STRESS STAGE 4 HR: 93 {beats}/min
CHL CUP STRESS STAGE 5 DBP: 78 mmHg
CHL CUP STRESS STAGE 5 GRADE: 12 %
CHL CUP STRESS STAGE 6 HR: 127 {beats}/min
CHL CUP STRESS STAGE 6 SPEED: 3.4 mph
CHL CUP STRESS STAGE 7 HR: 105 {beats}/min
CHL CUP STRESS STAGE 7 SPEED: 0 mph
CSEPPHR: 127 {beats}/min
Estimated workload: 7.8 METS
Exercise duration (min): 6 min
Exercise duration (sec): 31 s
LV dias vol: 73 mL (ref 62–150)
LV sys vol: 21 mL
Percent of predicted max HR: 87 %
SRS: 0
SSS: 2
Stage 1 HR: 73 {beats}/min
Stage 3 Speed: 0.1 mph
Stage 4 DBP: 85 mmHg
Stage 4 Grade: 10 %
Stage 4 SBP: 151 mmHg
Stage 4 Speed: 1.7 mph
Stage 5 HR: 117 {beats}/min
Stage 5 SBP: 170 mmHg
Stage 5 Speed: 2.5 mph
Stage 6 Grade: 14 %
Stage 7 Grade: 0 %
Stage 8 DBP: 96 mmHg
Stage 8 Grade: 0 %
Stage 8 HR: 81 {beats}/min
Stage 8 SBP: 167 mmHg
Stage 8 Speed: 0 mph
TID: 0.89

## 2015-10-20 LAB — BASIC METABOLIC PANEL
Anion gap: 5 (ref 5–15)
BUN: 17 mg/dL (ref 6–20)
CHLORIDE: 110 mmol/L (ref 101–111)
CO2: 24 mmol/L (ref 22–32)
Calcium: 9.1 mg/dL (ref 8.9–10.3)
Creatinine, Ser: 1.19 mg/dL (ref 0.61–1.24)
GFR calc Af Amer: >60 mL/min (ref >60)
GFR, EST NON AFRICAN AMERICAN: 58 mL/min — AB (ref >60)
GLUCOSE: 102 mg/dL — AB (ref 65–99)
POTASSIUM: 4 mmol/L (ref 3.5–5.1)
SODIUM: 139 mmol/L (ref 135–145)

## 2015-10-20 LAB — CBC
HEMATOCRIT: 41.5 % (ref 40.0–52.0)
HEMOGLOBIN: 14.1 g/dL (ref 13.0–18.0)
MCH: 33.2 pg (ref 26.0–34.0)
MCHC: 34 g/dL (ref 32.0–36.0)
MCV: 97.7 fL (ref 80.0–100.0)
Platelets: 179 10*3/uL (ref 150–440)
RBC: 4.25 MIL/uL — ABNORMAL LOW (ref 4.40–5.90)
RDW: 13.1 % (ref 11.5–14.5)
WBC: 8.6 10*3/uL (ref 3.8–10.6)

## 2015-10-20 LAB — GLUCOSE, CAPILLARY: GLUCOSE-CAPILLARY: 74 mg/dL (ref 65–99)

## 2015-10-20 LAB — TROPONIN I: Troponin I: <0.03 ng/mL (ref <0.031)

## 2015-10-20 MED ORDER — TECHNETIUM TC 99M SESTAMIBI - CARDIOLITE
12.7330 | Freq: Once | INTRAVENOUS | Status: AC | PRN
  Administered 2015-10-20: 12.733 via INTRAVENOUS

## 2015-10-20 MED ORDER — LORATADINE 10 MG PO TABS
10.0000 mg | ORAL_TABLET | Freq: Every day | ORAL | Status: DC
  Administered 2015-10-20: 10 mg via ORAL
  Filled 2015-10-20: qty 1

## 2015-10-20 MED ORDER — PANTOPRAZOLE SODIUM 40 MG PO TBEC
40.0000 mg | DELAYED_RELEASE_TABLET | Freq: Every day | ORAL | Status: DC
  Administered 2015-10-20: 40 mg via ORAL

## 2015-10-20 MED ORDER — LATANOPROST 0.005 % OP SOLN
1.0000 [drp] | Freq: Every day | OPHTHALMIC | Status: DC
Start: 2015-10-20 — End: 2015-10-20
  Filled 2015-10-20: qty 2.5

## 2015-10-20 MED ORDER — TIMOLOL MALEATE 0.25 % OP SOLN
1.0000 [drp] | Freq: Every day | OPHTHALMIC | Status: DC
  Filled 2015-10-20: qty 5

## 2015-10-20 MED ORDER — TECHNETIUM TC 99M SESTAMIBI GENERIC - CARDIOLITE
30.4900 | Freq: Once | INTRAVENOUS | Status: AC | PRN
  Administered 2015-10-20: 30.49 via INTRAVENOUS

## 2015-10-20 MED ORDER — TRIAMCINOLONE ACETONIDE 0.1 % EX CREA
1.0000 | TOPICAL_CREAM | Freq: Two times a day (BID) | CUTANEOUS | Status: DC | PRN
Start: 2015-10-20 — End: 2015-10-20
  Filled 2015-10-20: qty 15

## 2015-10-20 MED ORDER — NITROGLYCERIN 0.4 MG SL SUBL: 0.4000 mg | SUBLINGUAL_TABLET | SUBLINGUAL | Status: DC | PRN

## 2015-10-20 NOTE — Progress Notes (Signed)
Stress myoview completed this am. Normal lv function with normal lv function and no evidence of ischemia. OK for discharge from cardiac standpoint on current meds. Follow up as out patient.

## 2015-10-20 NOTE — Progress Notes (Signed)
Patient d/c'd home. Education provided, no questions at this time. Patient driving self home, no restrictions. Educated on safety of driving self home, patient acknowledges risks and has decided to drive self. Telemetry removed. Wilnette Kales

## 2015-10-20 NOTE — Care Management Obs Status (Signed)
[facility] NOTIFICATION   Patient Details  Name: Bradley [name] MRN: [id number] Date of Birth: 1941-04-24   Medicare Observation Status Notification Given:  Yes    Jolly Mango, RN 10/20/2015, 11:26 AM

## 2015-10-20 NOTE — Discharge Summary (Signed)
[facility] [facility] [place] NAME: Bradley [name]    MR#:  [id number]  DATE OF BIRTH:  09-Jul-1941  DATE OF ADMISSION:  10/19/2015 ADMITTING PHYSICIAN: Epifanio Lesches, MD  DATE OF DISCHARGE: 10/20/2015  PRIMARY CARE PHYSICIAN: FELDPAUSCH, DALE E, MD    ADMISSION DIAGNOSIS:  Bradycardia [R00.1] Chest pain, unspecified chest pain type [R07.9]  DISCHARGE DIAGNOSIS:  Chest pain appears atypical GERD-chronic HTN  SECONDARY DIAGNOSIS:   Past Medical History  Diagnosis Date  . Coronary artery disease   . Meningioma ([place])     stable, followed by yearly MRIs  . History of hiatal hernia 2012  . Essential hypertension 06/05/2014  . Hyperlipidemia   . BPH (benign prostatic hyperplasia)   . Cataracts, bilateral   . Hemorrhoids   . Migraines   . RBBB   . Stomach ulcer   . GI bleed     a. ~2012 around time of hiatal hernia surgery. 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  Marland Kitchen History of chicken pox   . Glaucoma   . GERD (gastroesophageal reflux disease)     Barretts esophagus    HOSPITAL COURSE:   #1 chest pain  - Has history of coronary artery disease and CABG last year.  - continue aspirin, nitroglycerin, statins. Unable to use beta blockers because of bradycardia. Cardiology consult with Dr. Nehemiah Massed, continue Lovenox. -CE x3 negative -stress test no acute ischemia. F/u with dr Nehemiah Massed as out pt Patient may need stress test. #2 history of BPH: On Avodart continue that. #3 GERD continue PPIs.pt has had GI w/u in jan 2016 showed barretts esophagaus #4 Hyperlipidemia: Continue atorvastatin 40 MG by mouth daily Glaucoma continue eyedrops. #5 Essential hypertension: Unable to use beta blockers due to bradycardia. Blood pressure is soft so monitor closely. 6.. proximal atrial fibrillation: Was on amiodarone but ,we stopped because of bradycardia. CONSULTS OBTAINED:  Treatment Team:  Teodoro Spray, MD  DRUG  ALLERGIES:   Allergies  Allergen Reactions  . Oxycodone Other (See Comments)    Reaction:  Hypotension   . Sulfa Antibiotics Rash    DISCHARGE MEDICATIONS:   Current Discharge Medication List    CONTINUE these medications which have NOT CHANGED   Details  aspirin 81 MG tablet Take 81 mg by mouth daily.    atorvastatin (LIPITOR) 20 MG tablet Take 20 mg by mouth daily.    cetirizine (ZYRTEC) 5 MG tablet Take 5 mg by mouth daily.    chlorhexidine (PERIDEX) 0.12 % solution Use as directed 15 mLs in the mouth or throat 2 (two) times daily.    cyanocobalamin 100 MCG tablet Take 100 mcg by mouth daily.    dutasteride (AVODART) 0.5 MG capsule Take 0.5 mg by mouth daily.    hydrocortisone cream 1 % Apply 1 application topically as needed for itching.    latanoprost (XALATAN) 0.005 % ophthalmic solution Place 1 drop into both eyes at bedtime.    nitroGLYCERIN (NITROSTAT) 0.4 MG SL tablet Place 0.4 mg under the tongue every 5 (five) minutes as needed for chest pain.    omeprazole (PRILOSEC) 20 MG capsule Take 20 mg by mouth daily.    timolol (BETIMOL) 0.25 % ophthalmic solution Place 1 drop into both eyes daily.    triamcinolone cream (KENALOG) 0.1 % Apply 1 application topically 2 (two) times daily as needed (Foot).      STOP taking these medications     fluticasone (FLONASE) 50 MCG/ACT nasal spray  If you experience worsening of your admission symptoms, develop shortness of breath, life threatening emergency, suicidal or homicidal thoughts you must seek medical attention immediately by calling 911 or calling your MD immediately  if symptoms less severe.  You Must read complete instructions/literature along with all the possible adverse reactions/side effects for all the Medicines you take and that have been prescribed to you. Take any new Medicines after you have completely understood and accept all the possible adverse reactions/side effects.   Please note  You  were cared for by a hospitalist during your hospital stay. If you have any questions about your discharge medications or the care you received while you were in the hospital after you are discharged, you can call the unit and asked to speak with the hospitalist on call if the hospitalist that took care of you is not available. Once you are discharged, your primary care physician will handle any further medical issues. Please note that NO REFILLS for any discharge medications will be authorized once you are discharged, as it is imperative that you return to your primary care physician (or establish a relationship with a primary care physician if you do not have one) for your aftercare needs so that they can reassess your need for medications and monitor your lab values. Today   SUBJECTIVE   Feels ok. Wants to go home  VITAL SIGNS:  Blood pressure 131/74, pulse 58, temperature 97.5 F (36.4 C), temperature source Oral, resp. rate 14, height 6\' 3"  (1.905 m), weight 101.107 kg (222 lb 14.4 oz), SpO2 99 %.  I/O:   Intake/Output Summary (Last 24 hours) at 10/20/15 1504 Last data filed at 10/20/15 1130  Gross per 24 hour  Intake    360 ml  Output   1400 ml  Net  -1040 ml    PHYSICAL EXAMINATION:  GENERAL:  75 y.o.-year-old patient lying in the bed with no acute distress.  EYES: Pupils equal, round, reactive to light and accommodation. No scleral icterus. Extraocular muscles intact.  HEENT: Head atraumatic, normocephalic. Oropharynx and nasopharynx clear.  NECK:  Supple, no jugular venous distention. No thyroid enlargement, no tenderness.  LUNGS: Normal breath sounds bilaterally, no wheezing, rales,rhonchi or crepitation. No use of accessory muscles of respiration.  CARDIOVASCULAR: S1, S2 normal. No murmurs, rubs, or gallops.  ABDOMEN: Soft, non-tender, non-distended. Bowel sounds present. No organomegaly or mass.  EXTREMITIES: No pedal edema, cyanosis, or clubbing.  NEUROLOGIC: Cranial nerves  II through XII are intact. Muscle strength 5/5 in all extremities. Sensation intact. Gait not checked.  PSYCHIATRIC: The patient is alert and oriented x 3.  SKIN: No obvious rash, lesion, or ulcer.   DATA REVIEW:   CBC   Recent Labs Lab 10/20/15 0350  WBC 8.6  HGB 14.1  HCT 41.5  PLT 179    Chemistries   Recent Labs Lab 10/19/15 1158 10/20/15 0350  NA 139 139  K 3.9 4.0  CL 109 110  CO2 26 24  GLUCOSE 73 102*  BUN 19 17  CREATININE 1.32* 1.19  CALCIUM 9.6 9.1  AST 71*  --   ALT 43  --   ALKPHOS 112  --   BILITOT 0.6  --     Microbiology Results   No results found for this or any previous visit (from the past 240 hour(s)).  RADIOLOGY:  Nm Myocar Multi W/spect W/wall Motion / Ef  10/20/2015   The study is normal.  This is a low risk study.  The left  ventricular ejection fraction is normal (55-65%).  There was no ST segment deviation noted during stress.  No T wave inversion was noted during stress.    Dg Chest Port 1 View  10/19/2015  CLINICAL DATA:  Chest pain this morning EXAM: PORTABLE CHEST 1 VIEW COMPARISON:  03/04/2015 FINDINGS: Mild cardiomegaly. Bibasilar atelectasis. Upper lungs clear. Normal vascularity. Postoperative changes. No pneumothorax. IMPRESSION: Cardiomegaly without edema.  Bibasilar atelectasis. Electronically Signed   By: Marybelle Killings M.D.   On: 10/19/2015 12:17     Management plans discussed with the patient, family and they are in agreement.  CODE STATUS:     Code Status Orders        Start     Ordered   10/19/15 1335  Full code   Continuous     10/19/15 1338    Code Status History    Date Active Date Inactive Code Status Order ID Comments User Context   01/19/2015  1:27 PM 01/21/2015  8:22 AM Full Code [id number]  Coolidge Breeze, PA-C Inpatient   01/07/2015  8:11 AM 01/07/2015  1:38 PM Full Code [id number]  Corey Skains, MD Inpatient      TOTAL TIME TAKING CARE OF THIS PATIENT: 40 minutes.    Candie Gintz M.D on 10/20/2015 at  3:04 PM  Between 7am to 6pm - Pager - [phone] After 6pm go to [email or web address] - password [email or web address] [facility]  [facility]  Office  [phone]  CC: Primary care physician; [facility], Chrissie Noa, MD

## 2015-11-08 ENCOUNTER — Emergency Department
Admission: EM | Admit: 2015-11-08 | Discharge: 2015-11-08 | Disposition: A | Discharge status: Home / Self Care | Payer: Medicare Other | Attending: Emergency Medicine | Admitting: Emergency Medicine

## 2015-11-08 ENCOUNTER — Encounter: Payer: Self-pay | Admitting: *Deleted

## 2015-11-08 DIAGNOSIS — Z955 Presence of coronary angioplasty implant and graft: Secondary | ICD-10-CM | POA: Diagnosis not present

## 2015-11-08 DIAGNOSIS — N289 Disorder of kidney and ureter, unspecified: Secondary | ICD-10-CM | POA: Insufficient documentation

## 2015-11-08 DIAGNOSIS — D320 Benign neoplasm of cerebral meninges: Secondary | ICD-10-CM | POA: Diagnosis not present

## 2015-11-08 DIAGNOSIS — R197 Diarrhea, unspecified: Secondary | ICD-10-CM | POA: Diagnosis present

## 2015-11-08 DIAGNOSIS — E785 Hyperlipidemia, unspecified: Secondary | ICD-10-CM | POA: Diagnosis not present

## 2015-11-08 DIAGNOSIS — Z79899 Other long term (current) drug therapy: Secondary | ICD-10-CM | POA: Diagnosis not present

## 2015-11-08 DIAGNOSIS — D075 Carcinoma in situ of prostate: Secondary | ICD-10-CM | POA: Insufficient documentation

## 2015-11-08 DIAGNOSIS — N401 Enlarged prostate with lower urinary tract symptoms: Secondary | ICD-10-CM | POA: Diagnosis not present

## 2015-11-08 DIAGNOSIS — D126 Benign neoplasm of colon, unspecified: Secondary | ICD-10-CM | POA: Diagnosis not present

## 2015-11-08 DIAGNOSIS — Z7982 Long term (current) use of aspirin: Secondary | ICD-10-CM | POA: Insufficient documentation

## 2015-11-08 DIAGNOSIS — I10 Essential (primary) hypertension: Secondary | ICD-10-CM | POA: Insufficient documentation

## 2015-11-08 LAB — CBC WITH DIFFERENTIAL/PLATELET
BASOS PCT: 1 %
Basophils Absolute: 0.1 10*3/uL (ref 0–0.1)
EOS ABS: 0.6 10*3/uL (ref 0–0.7)
Eosinophils Relative: 6 %
HCT: 47.3 % (ref 40.0–52.0)
HEMOGLOBIN: 16.2 g/dL (ref 13.0–18.0)
Lymphocytes Relative: 9 %
Lymphs Abs: 0.9 10*3/uL — ABNORMAL LOW (ref 1.0–3.6)
MCH: 33.1 pg (ref 26.0–34.0)
MCHC: 34.3 g/dL (ref 32.0–36.0)
MCV: 96.4 fL (ref 80.0–100.0)
MONOS PCT: 5 %
Monocytes Absolute: 0.5 10*3/uL (ref 0.2–1.0)
NEUTROS PCT: 79 %
Neutro Abs: 8.6 10*3/uL — ABNORMAL HIGH (ref 1.4–6.5)
PLATELETS: 199 10*3/uL (ref 150–440)
RBC: 4.9 MIL/uL (ref 4.40–5.90)
RDW: 13.2 % (ref 11.5–14.5)
WBC: 10.8 10*3/uL — AB (ref 3.8–10.6)

## 2015-11-08 LAB — COMPREHENSIVE METABOLIC PANEL
ALBUMIN: 4.3 g/dL (ref 3.5–5.0)
ALK PHOS: 97 U/L (ref 38–126)
ALT: 14 U/L — ABNORMAL LOW (ref 17–63)
AST: 15 U/L (ref 15–41)
Anion gap: 8 (ref 5–15)
BUN: 33 mg/dL — ABNORMAL HIGH (ref 6–20)
CHLORIDE: 111 mmol/L (ref 101–111)
CO2: 17 mmol/L — ABNORMAL LOW (ref 22–32)
CREATININE: 1.48 mg/dL — AB (ref 0.61–1.24)
Calcium: 9.7 mg/dL (ref 8.9–10.3)
GFR calc Af Amer: 51 mL/min — ABNORMAL LOW (ref >60)
GFR calc non Af Amer: 44 mL/min — ABNORMAL LOW (ref >60)
Glucose, Bld: 102 mg/dL — ABNORMAL HIGH (ref 65–99)
Potassium: 4 mmol/L (ref 3.5–5.1)
SODIUM: 136 mmol/L (ref 135–145)
TOTAL PROTEIN: 7.5 g/dL (ref 6.5–8.1)
Total Bilirubin: 1 mg/dL (ref 0.3–1.2)

## 2015-11-08 LAB — GASTROINTESTINAL PANEL BY PCR, STOOL (REPLACES STOOL CULTURE)
Adenovirus F40/41: NOT DETECTED
Astrovirus: NOT DETECTED
CAMPYLOBACTER SPECIES: NOT DETECTED
CRYPTOSPORIDIUM: NOT DETECTED
Cyclospora cayetanensis: NOT DETECTED
E. coli O157: NOT DETECTED
ENTEROAGGREGATIVE E COLI (EAEC): NOT DETECTED
Entamoeba histolytica: NOT DETECTED
Enteropathogenic E coli (EPEC): NOT DETECTED
Enterotoxigenic E coli (ETEC): NOT DETECTED
Giardia lamblia: NOT DETECTED
NOROVIRUS GI/GII: NOT DETECTED
PLESIMONAS SHIGELLOIDES: NOT DETECTED
Rotavirus A: NOT DETECTED
SALMONELLA SPECIES: NOT DETECTED
SAPOVIRUS (I, II, IV, AND V): NOT DETECTED
SHIGA LIKE TOXIN PRODUCING E COLI (STEC): NOT DETECTED
Shigella/Enteroinvasive E coli (EIEC): NOT DETECTED
Vibrio cholerae: NOT DETECTED
Vibrio species: NOT DETECTED
Yersinia enterocolitica: NOT DETECTED

## 2015-11-08 LAB — C DIFFICILE QUICK SCREEN W PCR REFLEX
C DIFFICLE (CDIFF) ANTIGEN: NEGATIVE
C Diff interpretation: NEGATIVE
C Diff toxin: NEGATIVE

## 2015-11-08 LAB — LIPASE, BLOOD: Lipase: 23 U/L (ref 11–51)

## 2015-11-08 MED ORDER — SODIUM CHLORIDE 0.9 % IV BOLUS (SEPSIS)
500.0000 mL | INTRAVENOUS | Status: AC
  Administered 2015-11-08: 500 mL via INTRAVENOUS

## 2015-11-08 NOTE — ED Provider Notes (Signed)
[facility] Emergency Department Provider Note  ____________________________________________  Time seen: Approximately 3:37 AM  I have reviewed the triage vital signs and the nursing notes.   HISTORY  Chief Complaint Diarrhea    HPI Bradley [name] is a 75 y.o. male with PMH as described below who presents with gradual onset of diarrhea x 4 days which has become severe.  He states that the stools are watery, non-bloody, and started out more frequent (4-5x daily).  They had become less frequent (2-3x daily) until tonight, when he starting being unable to control his bowels; he had watery diarrhea bowel incontinence twice tonight.  He reports that 4 days ago he had some mild LLQ pain, but it has resolved for days.  Nausea, no vomiting.  Denies fever/chills, CP, SOB, upper abd pain.  No recent bad food exposures, no other family members with similar symptoms, no recent travel.  Saw PCP several days ago and prescribed Flagyl in case his symptoms worsened, but he has not taken any of it.   Past Medical History  Diagnosis Date  . Coronary artery disease   . Meningioma ([place])     stable, followed by yearly MRIs  . History of hiatal hernia 2012  . Essential hypertension 06/05/2014  . Hyperlipidemia   . BPH (benign prostatic hyperplasia)   . Cataracts, bilateral   . Hemorrhoids   . Migraines   . RBBB   . Stomach ulcer   . GI bleed     a. ~2012 around time of hiatal hernia surgery. 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  Marland Kitchen History of chicken pox   . Glaucoma   . GERD (gastroesophageal reflux disease)     Barretts esophagus    Patient Active Problem List   Diagnosis Date Noted  . Atrial fibrillation ([place]) 01/30/2015  . 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 Stable angina ([place]) 01/07/2015  . Arteriosclerosis of coronary artery 12/10/2014  . Benign essential HTN 06/05/2014  . Chest pain 01/12/2014  . Angina pectoris ([place]) 10/09/2013  . 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 Benign prostatic hyperplasia with urinary obstruction 04/01/2012  . 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 Cardiac surgery      coronary angioplasty  . Cholecystectomy    . Hiatal hernia repair N/A   . Cardiac catheterization N/A 01/07/2015    Procedure: Left Heart Cath;  Surgeon: Corey Skains, MD;  Location: [place] CV LAB;  Service: Cardiovascular;  Laterality: N/A;  . Colonoscopy  11/03/08, 03/09/14  . Esophagogastroduodenoscopy  02/19/13  . Esophagus surgery    . Coronary angioplasty    . Atherectomy  1992  . Diagnostic laparoscopy      cholecystectomy and hiatal hernia repair  . Eye surgery      cataracts BIL  . Hernia repair    . Coronary artery bypass graft N/A 01/19/2015    Procedure: CORONARY ARTERY BYPASS GRAFTING (CABG), with LIM to LAD, vein to left intermediate, vein to PD, vein to OM, using right greater saphenous vein via endovein harvest.;  Surgeon: Grace Isaac, MD;  Location: [place];  Service: Open Heart Surgery;  Laterality: N/A;  . Tee without cardioversion N/A 01/19/2015    Procedure: TRANSESOPHAGEAL ECHOCARDIOGRAM (TEE);  Surgeon: Grace Isaac, MD;  Location: [place];  Service: Open Heart Surgery;  Laterality: N/A;  .  Endovein harvest of greater saphenous vein Right 01/19/2015    Procedure: ENDOVEIN HARVEST OF GREATER SAPHENOUS VEIN;  Surgeon: Grace Isaac, MD;  Location: [place];  Service: Open Heart Surgery;  Laterality: Right;  . Esophagogastroduodenoscopy (egd) with propofol N/A 08/09/2015    Procedure: ESOPHAGOGASTRODUODENOSCOPY (EGD) WITH PROPOFOL;  Surgeon: Hulen Luster, MD;  Location[facility] ENDOSCOPY;  Service: Gastroenterology;  Laterality: N/A;    Current Outpatient Rx  Name  Route  Sig  Dispense  Refill  . aspirin 81 MG tablet   Oral   Take 81 mg by mouth daily.         Marland Kitchen atorvastatin (LIPITOR) 20  MG tablet   Oral   Take 20 mg by mouth daily.         . cetirizine (ZYRTEC) 5 MG tablet   Oral   Take 5 mg by mouth daily.         . chlorhexidine (PERIDEX) 0.12 % solution   Mouth/Throat   Use as directed 15 mLs in the mouth or throat 2 (two) times daily.         . cyanocobalamin 100 MCG tablet   Oral   Take 100 mcg by mouth daily.         Marland Kitchen dutasteride (AVODART) 0.5 MG capsule   Oral   Take 0.5 mg by mouth daily.         . hydrocortisone cream 1 %   Topical   Apply 1 application topically as needed for itching.         . latanoprost (XALATAN) 0.005 % ophthalmic solution   Both Eyes   Place 1 drop into both eyes at bedtime.         . nitroGLYCERIN (NITROSTAT) 0.4 MG SL tablet   Sublingual   Place 0.4 mg under the tongue every 5 (five) minutes as needed for chest pain.         Marland Kitchen omeprazole (PRILOSEC) 20 MG capsule   Oral   Take 20 mg by mouth daily.         . timolol (BETIMOL) 0.25 % ophthalmic solution   Both Eyes   Place 1 drop into both eyes daily.         Marland Kitchen triamcinolone cream (KENALOG) 0.1 %   Topical   Apply 1 application topically 2 (two) times daily as needed (Foot).           Allergies Oxycodone and Sulfa antibiotics  Family History  Problem Relation Age of Onset  . Parkinsonism Mother   . Kidney disease Father   . Cancer Father     prostate  . Heart disease Father   . Cancer Paternal Uncle     esophageal  . Cancer Paternal Uncle     Social History Social History  Substance Use Topics  . Smoking status: Never Smoker   . Smokeless tobacco: Never Used  . Alcohol Use: No    Review of Systems Constitutional: No fever/chills Eyes: No visual changes. ENT: No sore throat. Cardiovascular: Denies chest pain. Respiratory: Denies shortness of breath. Gastrointestinal: LLQ abd pain 4 days ago, resolved.  Nausea, no vomiting.  +diarrhea with bowel incontinence.  No constipation. Genitourinary: Negative for  dysuria. Musculoskeletal: Negative for back pain. Skin: Negative for rash. Neurological: Negative for headaches, focal weakness or numbness.  10-point ROS otherwise negative.  ____________________________________________   PHYSICAL EXAM:  VITAL SIGNS: ED Triage Vitals  Enc Vitals Group     BP 11/08/15 0334 151/101 mmHg  Pulse Rate 11/08/15 0334 71     Resp 11/08/15 0334 20     Temp 11/08/15 0334 97.5 F (36.4 C)     Temp Source 11/08/15 0334 Axillary     SpO2 11/08/15 0334 96 %     Weight 11/08/15 0334 210 lb (95.255 kg)     Height 11/08/15 0334 6\' 2"  (1.88 m)     Head Cir --      Peak Flow --      Pain Score --      Pain Loc --      Pain Edu? --      Excl. in [place]? --     Constitutional: Alert and oriented. Well appearing and in no acute distress. Eyes: Conjunctivae are normal. PERRL. EOMI. Head: Atraumatic. Nose: No congestion/rhinnorhea. Mouth/Throat: Mucous membranes are moist.  Oropharynx non-erythematous. Neck: No stridor.  No meningeal signs.   Cardiovascular: Normal rate, regular rhythm. Good peripheral circulation. Grossly normal heart sounds.   Respiratory: Normal respiratory effort.  No retractions. Lungs CTAB. Gastrointestinal: Soft and nontender, even in LLQ.   Rectal:  deferred Musculoskeletal: No lower extremity tenderness nor edema. No gross deformities of extremities. Neurologic:  Normal speech and language. No gross focal neurologic deficits are appreciated.  Skin:  Skin is warm, dry and intact. No rash noted. Psychiatric: Mood and affect are normal. Speech and behavior are normal.  ____________________________________________   LABS (all labs ordered are listed, but only abnormal results are displayed)  Labs Reviewed  COMPREHENSIVE METABOLIC PANEL - Abnormal; Notable for the following:    CO2 17 (*)    Glucose, Bld 102 (*)    BUN 33 (*)    Creatinine, Ser 1.48 (*)    ALT 14 (*)    GFR calc non Af Amer 44 (*)    GFR calc Af Amer 51 (*)     All other components within normal limits  CBC WITH DIFFERENTIAL/PLATELET - Abnormal; Notable for the following:    WBC 10.8 (*)    Neutro Abs 8.6 (*)    Lymphs Abs 0.9 (*)    All other components within normal limits  GASTROINTESTINAL PANEL BY PCR, STOOL (REPLACES STOOL CULTURE)  C DIFFICILE QUICK SCREEN W PCR REFLEX  LIPASE, BLOOD  URINALYSIS COMPLETEWITH MICROSCOPIC ([facility] ONLY)   ____________________________________________  EKG  None ____________________________________________  RADIOLOGY   No results found.  ____________________________________________   PROCEDURES  Procedure(s) performed: None  Critical Care performed: No ____________________________________________   INITIAL IMPRESSION / ASSESSMENT AND PLAN / ED COURSE  Pertinent labs & imaging results that were available during my care of the patient were reviewed by me and considered in my medical decision making (see chart for details).  No specific infectious exposure, but the progression to bowel incontinence is somewhat concerning.  Will check basic labs as well as GI pathogen panel to guide therapy (supportive care for viral vs antibiotic treatment if appropriate).  No indication for imaging at this time; given no abd pain/tenderness, I would only worsen his diarrhea with PO contrast and there is no indication of diverticulitis, appendicitis, or other acute/surgical intraabdominal pathology at this time.   ----------------------------------------- 6:49 AM on 11/08/2015 -----------------------------------------  GI pathogen panel and C. difficile were all negative.  Patient has had no more bowel movements while in the emergency department for multiple hours.  I provided the reassuring news and they have giving him 500 mL normal saline for a very slight renal insufficiency compared to his baseline.  I encourage  plenty of by mouth intake and for him to follow up with his regular doctor.  I provided their  results and his wife told me that he has really been having these episodes of diarrhea for about 3 months, and I suggested that perhaps she would benefit from referral to a gastroenterologist from his primary care doctor.  He is in no acute distress and continues to have no abdominal pain nor tenderness.  I believe he is safe and appropriate for discharge and outpatient management. ____________________________________________  FINAL CLINICAL IMPRESSION(S) / ED DIAGNOSES  Final diagnoses:  Diarrhea, unspecified type  Renal insufficiency      NEW MEDICATIONS STARTED DURING THIS VISIT:  New Prescriptions   No medications on file      Note:  This document was prepared using Dragon voice recognition software and may include unintentional dictation errors.   Hinda Kehr, MD 11/08/15 [phone]

## 2015-11-08 NOTE — Discharge Instructions (Signed)
As we discussed, your workup was reassuring today.  We performed a GI pathogen panel test the test for nearly 2 dozen different bacterial and viral sources of infection, and these were all negative, including testing for C. difficile.  As a result I do not think that taking any antibiotics will benefit you.  I encourage you to read through the included information about diarrhea and dietary choices for diarrhea and continue to use over-the-counter Imodium (loperamide) as needed for symptom control.  Follow up with your regular doctor to help you determine if you need to follow up with a gastroenterologist as well.    It appears that you are slightly volume depleted based from your recent stools and you had a very minimally elevated kidney function test.  When you follow up with your regular doctor, he/she may wish to repeat some outpatient blood work to make sure your kidneys are functioning well, but tonight it was not serious nor requiring admission to the hospital.     Diarrhea Diarrhea is frequent loose and watery bowel movements. It can cause you to feel weak and dehydrated. Dehydration can cause you to become tired and thirsty, have a dry mouth, and have decreased urination that often is dark yellow. Diarrhea is a sign of another problem, most often an infection that will not last long. In most cases, diarrhea typically lasts 2-3 days. However, it can last longer if it is a sign of something more serious. It is important to treat your diarrhea as directed by your caregiver to lessen or prevent future episodes of diarrhea. CAUSES  Some common causes include:  Gastrointestinal infections caused by viruses, bacteria, or parasites.  Food poisoning or food allergies.  Certain medicines, such as antibiotics, chemotherapy, and laxatives.  Artificial sweeteners and fructose.  Digestive disorders. HOME CARE INSTRUCTIONS  Ensure adequate fluid intake (hydration): Have 1 cup (8 oz) of fluid for each  diarrhea episode. Avoid fluids that contain simple sugars or sports drinks, fruit juices, whole milk products, and sodas. Your urine should be clear or pale yellow if you are drinking enough fluids. Hydrate with an oral rehydration solution that you can purchase at pharmacies, retail stores, and online. You can prepare an oral rehydration solution at home by mixing the following ingredients together:   - tsp table salt.   tsp baking soda.   tsp salt substitute containing potassium chloride.  1  tablespoons sugar.  1 L (34 oz) of water.  Certain foods and beverages may increase the speed at which food moves through the gastrointestinal (GI) tract. These foods and beverages should be avoided and include:  Caffeinated and alcoholic beverages.  High-fiber foods, such as raw fruits and vegetables, nuts, seeds, and whole grain breads and cereals.  Foods and beverages sweetened with sugar alcohols, such as xylitol, sorbitol, and mannitol.  Some foods may be well tolerated and may help thicken stool including:  Starchy foods, such as rice, toast, pasta, low-sugar cereal, oatmeal, grits, baked potatoes, crackers, and bagels.  Bananas.  Applesauce.  Add probiotic-rich foods to help increase healthy bacteria in the GI tract, such as yogurt and fermented milk products.  Wash your hands well after each diarrhea episode.  Only take over-the-counter or prescription medicines as directed by your caregiver.  Take a warm bath to relieve any burning or pain from frequent diarrhea episodes. SEEK IMMEDIATE MEDICAL CARE IF:   You are unable to keep fluids down.  You have persistent vomiting.  You have blood in your  stool, or your stools are black and tarry.  You do not urinate in 6-8 hours, or there is only a small amount of very dark urine.  You have abdominal pain that increases or localizes.  You have weakness, dizziness, confusion, or light-headedness.  You have a severe  headache.  Your diarrhea gets worse or does not get better.  You have a fever or persistent symptoms for more than 2-3 days.  You have a fever and your symptoms suddenly get worse. MAKE SURE YOU:   Understand these instructions.  Will watch your condition.  Will get help right away if you are not doing well or get worse.   This information is not intended to replace advice given to you by your health care provider. Make sure you discuss any questions you have with your health care provider.   Document Released: 06/23/2002 Document Revised: 07/24/2014 Document Reviewed: 03/10/2012 Elsevier Interactive Patient Education 2016 [place] Choices to Help Relieve Diarrhea, Adult When you have diarrhea, the foods you eat and your eating habits are very important. Choosing the right foods and drinks can help relieve diarrhea. Also, because diarrhea can last up to 7 days, you need to replace lost fluids and electrolytes (such as sodium, potassium, and chloride) in order to help prevent dehydration.  WHAT GENERAL GUIDELINES DO I NEED TO FOLLOW?  Slowly drink 1 cup (8 oz) of fluid for each episode of diarrhea. If you are getting enough fluid, your urine will be clear or pale yellow.  Eat starchy foods. Some good choices include white rice, white toast, pasta, low-fiber cereal, baked potatoes (without the skin), saltine crackers, and bagels.  Avoid large servings of any cooked vegetables.  Limit fruit to two servings per day. A serving is  cup or 1 small piece.  Choose foods with less than 2 g of fiber per serving.  Limit fats to less than 8 tsp (38 g) per day.  Avoid fried foods.  Eat foods that have probiotics in them. Probiotics can be found in certain dairy products.  Avoid foods and beverages that may increase the speed at which food moves through the stomach and intestines (gastrointestinal tract). Things to avoid include:  High-fiber foods, such as dried fruit, raw  fruits and vegetables, nuts, seeds, and whole grain foods.  Spicy foods and high-fat foods.  Foods and beverages sweetened with high-fructose corn syrup, honey, or sugar alcohols such as xylitol, sorbitol, and mannitol. WHAT FOODS ARE RECOMMENDED? Grains White rice. White, [place], or pita breads (fresh or toasted), including plain rolls, buns, or bagels. White pasta. Saltine, soda, or graham crackers. Pretzels. Low-fiber cereal. Cooked cereals made with water (such as cornmeal, farina, or cream cereals). Plain muffins. Matzo. Melba toast. Zwieback.  Vegetables Potatoes (without the skin). Strained tomato and vegetable juices. Most well-cooked and canned vegetables without seeds. Tender lettuce. Fruits Cooked or canned applesauce, apricots, cherries, fruit cocktail, grapefruit, peaches, pears, or plums. Fresh bananas, apples without skin, cherries, grapes, cantaloupe, grapefruit, peaches, oranges, or plums.  Meat and Other Protein Products Baked or boiled chicken. Eggs. Tofu. Fish. Seafood. Smooth peanut butter. Ground or well-cooked tender beef, ham, veal, lamb, pork, or poultry.  Dairy Plain yogurt, kefir, and unsweetened liquid yogurt. Lactose-free milk, buttermilk, or soy milk. Plain hard cheese. Beverages Sport drinks. Clear broths. Diluted fruit juices (except prune). Regular, caffeine-free sodas such as ginger ale. Water. Decaffeinated teas. Oral rehydration solutions. Sugar-free beverages not sweetened with sugar alcohols. Other Bouillon, broth, or soups  made from recommended foods.  The items listed above may not be a complete list of recommended foods or beverages. Contact your dietitian for more options. WHAT FOODS ARE NOT RECOMMENDED? Grains Whole grain, whole wheat, bran, or rye breads, rolls, pastas, crackers, and cereals. Wild or brown rice. Cereals that contain more than 2 g of fiber per serving. Corn tortillas or taco shells. Cooked or dry oatmeal. Granola.  Popcorn. Vegetables Raw vegetables. Cabbage, broccoli, Brussels sprouts, artichokes, baked beans, beet greens, corn, kale, legumes, peas, sweet potatoes, and yams. Potato skins. Cooked spinach and cabbage. Fruits Dried fruit, including raisins and dates. Raw fruits. Stewed or dried prunes. Fresh apples with skin, apricots, mangoes, pears, raspberries, and strawberries.  Meat and Other Protein Products Chunky peanut butter. Nuts and seeds. Beans and lentils. Berniece Salines.  Dairy High-fat cheeses. Milk, chocolate milk, and beverages made with milk, such as milk shakes. Cream. Ice cream. Sweets and Desserts Sweet rolls, doughnuts, and sweet breads. Pancakes and waffles. Fats and Oils Butter. Cream sauces. Margarine. Salad oils. Plain salad dressings. Olives. Avocados.  Beverages Caffeinated beverages (such as coffee, tea, soda, or energy drinks). Alcoholic beverages. Fruit juices with pulp. Prune juice. Soft drinks sweetened with high-fructose corn syrup or sugar alcohols. Other Coconut. Hot sauce. Chili powder. Mayonnaise. Gravy. Cream-based or milk-based soups.  The items listed above may not be a complete list of foods and beverages to avoid. Contact your dietitian for more information. WHAT SHOULD I DO IF I BECOME DEHYDRATED? Diarrhea can sometimes lead to dehydration. Signs of dehydration include dark urine and dry mouth and skin. If you think you are dehydrated, you should rehydrate with an oral rehydration solution. These solutions can be purchased at pharmacies, retail stores, or online.  Drink -1 cup (120-240 mL) of oral rehydration solution each time you have an episode of diarrhea. If drinking this amount makes your diarrhea worse, try drinking smaller amounts more often. For example, drink 1-3 tsp (5-15 mL) every 5-10 minutes.  A general rule for staying hydrated is to drink 1-2 L of fluid per day. Talk to your health care provider about the specific amount you should be drinking each day.  Drink enough fluids to keep your urine clear or pale yellow.   This information is not intended to replace advice given to you by your health care provider. Make sure you discuss any questions you have with your health care provider.   Document Released: 09/23/2003 Document Revised: 07/24/2014 Document Reviewed: 05/26/2013 Elsevier Interactive Patient Education [employer].

## 2015-11-08 NOTE — ED Notes (Signed)
Pt presents w/ c/ diarrhea x 4 days. Pt took imodium @ 0214. Uncontrolled starting at midnight tonight. Pt c/o nausea tonight, no vomiting.

## 2015-11-09 ENCOUNTER — Other Ambulatory Visit: Payer: Self-pay | Admitting: Gastroenterology

## 2015-11-09 DIAGNOSIS — R197 Diarrhea, unspecified: Secondary | ICD-10-CM

## 2015-11-09 DIAGNOSIS — R1032 Left lower quadrant pain: Secondary | ICD-10-CM

## 2015-11-15 ENCOUNTER — Ambulatory Visit
Admission: RE | Admit: 2015-11-15 | Discharge: 2015-11-15 | Disposition: A | Discharge status: Home / Self Care | Payer: Medicare Other | Source: Ambulatory Visit | Attending: Gastroenterology | Admitting: Gastroenterology

## 2015-11-15 DIAGNOSIS — N289 Disorder of kidney and ureter, unspecified: Secondary | ICD-10-CM | POA: Diagnosis not present

## 2015-11-15 DIAGNOSIS — K8050 Calculus of bile duct without cholangitis or cholecystitis without obstruction: Secondary | ICD-10-CM | POA: Insufficient documentation

## 2015-11-15 DIAGNOSIS — K838 Other specified diseases of biliary tract: Secondary | ICD-10-CM | POA: Insufficient documentation

## 2015-11-15 DIAGNOSIS — I700 Atherosclerosis of aorta: Secondary | ICD-10-CM | POA: Insufficient documentation

## 2015-11-15 DIAGNOSIS — K5790 Diverticulosis of intestine, part unspecified, without perforation or abscess without bleeding: Secondary | ICD-10-CM | POA: Insufficient documentation

## 2015-11-15 DIAGNOSIS — R1032 Left lower quadrant pain: Secondary | ICD-10-CM | POA: Insufficient documentation

## 2015-11-15 DIAGNOSIS — R197 Diarrhea, unspecified: Secondary | ICD-10-CM | POA: Insufficient documentation

## 2017-03-16 ENCOUNTER — Other Ambulatory Visit: Payer: Self-pay | Admitting: Nurse Practitioner

## 2017-03-16 DIAGNOSIS — D320 Benign neoplasm of cerebral meninges: Secondary | ICD-10-CM

## 2017-03-16 DIAGNOSIS — R413 Other amnesia: Secondary | ICD-10-CM

## 2017-03-16 DIAGNOSIS — R479 Unspecified speech disturbances: Secondary | ICD-10-CM

## 2017-03-21 ENCOUNTER — Ambulatory Visit
Admission: RE | Admit: 2017-03-21 | Discharge: 2017-03-21 | Disposition: A | Discharge status: Home / Self Care | Payer: Medicare Other | Source: Ambulatory Visit | Attending: Nurse Practitioner | Admitting: Nurse Practitioner

## 2017-03-21 DIAGNOSIS — R479 Unspecified speech disturbances: Secondary | ICD-10-CM | POA: Diagnosis present

## 2017-03-21 DIAGNOSIS — R413 Other amnesia: Secondary | ICD-10-CM | POA: Diagnosis present

## 2017-03-21 DIAGNOSIS — G319 Degenerative disease of nervous system, unspecified: Secondary | ICD-10-CM | POA: Insufficient documentation

## 2017-03-21 DIAGNOSIS — D320 Benign neoplasm of cerebral meninges: Secondary | ICD-10-CM

## 2017-04-11 DIAGNOSIS — I6523 Occlusion and stenosis of bilateral carotid arteries: Secondary | ICD-10-CM | POA: Insufficient documentation

## 2017-04-13 ENCOUNTER — Ambulatory Visit: Payer: Medicare Other | Admitting: Anesthesiology

## 2017-04-13 ENCOUNTER — Encounter
Admission: RE | Disposition: A | Discharge status: Home / Self Care | Payer: Self-pay | Source: Ambulatory Visit | Attending: Internal Medicine

## 2017-04-13 ENCOUNTER — Encounter: Payer: Self-pay | Admitting: Anesthesiology

## 2017-04-13 ENCOUNTER — Ambulatory Visit
Admission: RE | Admit: 2017-04-13 | Discharge: 2017-04-13 | Disposition: A | Discharge status: Home / Self Care | Payer: Medicare Other | Source: Ambulatory Visit | Attending: Internal Medicine | Admitting: Internal Medicine

## 2017-04-13 DIAGNOSIS — K210 Gastro-esophageal reflux disease with esophagitis: Secondary | ICD-10-CM | POA: Insufficient documentation

## 2017-04-13 DIAGNOSIS — Z79899 Other long term (current) drug therapy: Secondary | ICD-10-CM | POA: Diagnosis not present

## 2017-04-13 DIAGNOSIS — K3184 Gastroparesis: Secondary | ICD-10-CM | POA: Diagnosis not present

## 2017-04-13 DIAGNOSIS — Z885 Allergy status to narcotic agent status: Secondary | ICD-10-CM | POA: Insufficient documentation

## 2017-04-13 DIAGNOSIS — I4510 Unspecified right bundle-branch block: Secondary | ICD-10-CM | POA: Insufficient documentation

## 2017-04-13 DIAGNOSIS — Z7982 Long term (current) use of aspirin: Secondary | ICD-10-CM | POA: Insufficient documentation

## 2017-04-13 DIAGNOSIS — K2970 Gastritis, unspecified, without bleeding: Secondary | ICD-10-CM | POA: Insufficient documentation

## 2017-04-13 DIAGNOSIS — K2270 Barrett's esophagus without dysplasia: Secondary | ICD-10-CM | POA: Insufficient documentation

## 2017-04-13 DIAGNOSIS — I2510 Atherosclerotic heart disease of native coronary artery without angina pectoris: Secondary | ICD-10-CM | POA: Diagnosis not present

## 2017-04-13 DIAGNOSIS — Z882 Allergy status to sulfonamides status: Secondary | ICD-10-CM | POA: Insufficient documentation

## 2017-04-13 DIAGNOSIS — Z881 Allergy status to other antibiotic agents status: Secondary | ICD-10-CM | POA: Insufficient documentation

## 2017-04-13 DIAGNOSIS — Z951 Presence of aortocoronary bypass graft: Secondary | ICD-10-CM | POA: Insufficient documentation

## 2017-04-13 DIAGNOSIS — E785 Hyperlipidemia, unspecified: Secondary | ICD-10-CM | POA: Insufficient documentation

## 2017-04-13 DIAGNOSIS — I10 Essential (primary) hypertension: Secondary | ICD-10-CM | POA: Diagnosis not present

## 2017-04-13 HISTORY — DX: Allergy, unspecified, initial encounter: T78.40XA

## 2017-04-13 HISTORY — DX: Barrett's esophagus without dysplasia: K22.70

## 2017-04-13 HISTORY — DX: Acute coronary thrombosis not resulting in myocardial infarction: I24.0

## 2017-04-13 HISTORY — DX: Polyp of colon: K63.5

## 2017-04-13 HISTORY — PX: ESOPHAGOGASTRODUODENOSCOPY (EGD) WITH PROPOFOL: SHX5813

## 2017-04-13 SURGERY — ESOPHAGOGASTRODUODENOSCOPY (EGD) WITH PROPOFOL
Anesthesia: General

## 2017-04-13 MED ORDER — FENTANYL CITRATE (PF) 100 MCG/2ML IJ SOLN
INTRAMUSCULAR | Status: AC
  Filled 2017-04-13: qty 2

## 2017-04-13 MED ORDER — PROPOFOL 500 MG/50ML IV EMUL
INTRAVENOUS | Status: AC
  Filled 2017-04-13: qty 50

## 2017-04-13 MED ORDER — PROPOFOL 10 MG/ML IV BOLUS
INTRAVENOUS | Status: DC | PRN
  Administered 2017-04-13: 70 mg via INTRAVENOUS

## 2017-04-13 MED ORDER — FENTANYL CITRATE (PF) 100 MCG/2ML IJ SOLN
INTRAMUSCULAR | Status: DC | PRN
  Administered 2017-04-13: 25 ug via INTRAVENOUS

## 2017-04-13 MED ORDER — SODIUM CHLORIDE 0.9 % IV SOLN
INTRAVENOUS | Status: DC
  Administered 2017-04-13: 13:00:00 via INTRAVENOUS

## 2017-04-13 MED ORDER — GLYCOPYRROLATE 0.2 MG/ML IJ SOLN
INTRAMUSCULAR | Status: DC | PRN
  Administered 2017-04-13: 0.2 mg via INTRAVENOUS

## 2017-04-13 MED ORDER — PROPOFOL 500 MG/50ML IV EMUL
INTRAVENOUS | Status: DC | PRN
  Administered 2017-04-13: 140 ug/kg/min via INTRAVENOUS

## 2017-04-13 MED ORDER — LIDOCAINE HCL (CARDIAC) 20 MG/ML IV SOLN
INTRAVENOUS | Status: DC | PRN
  Administered 2017-04-13: 40 mg via INTRAVENOUS

## 2017-04-13 NOTE — Op Note (Signed)
[facility] Patient Name: Bradley [name] Procedure Date: 04/13/2017 1:17 PM MRN: [id number] Account #: [id number] Date of Birth: 12/03/1940 Admit Type: Outpatient Age: 76 Room: [facility] ENDO ROOM 4 Gender: Male Note Status: Finalized Procedure:            Upper GI endoscopy Indications:          Gastro-esophageal reflux disease, Surveillance for                        malignancy due to personal history of Barrett's                        esophagus Providers:            Benay Pike. Alice Reichert MD, MD Referring MD:         Sofie Hartigan (Referring MD) Medicines:            Propofol per Anesthesia Complications:        No immediate complications. Procedure:            Pre-Anesthesia Assessment:                       - ASA Grade Assessment: III - A patient with severe                        systemic disease.                       - After reviewing the risks and benefits, the patient                        was deemed in satisfactory condition to undergo the                        procedure.                       After obtaining informed consent, the endoscope was                        passed under direct vision. Throughout the procedure,                        the patient's blood pressure, pulse, and oxygen                        saturations were monitored continuously. The Endoscope                        was introduced through the mouth, and advanced to the                        third part of duodenum. The upper GI endoscopy was                        accomplished without difficulty. The patient tolerated                        the procedure well. Findings:      The esophagus and gastroesophageal junction were examined with white       light. There were esophageal mucosal  changes secondary to established       short-segment Barrett's disease. These changes involved the mucosa at       the upper extent of the gastric folds (43 cm from the incisors)   extending to the Z-line (41 cm from the incisors). Mucosa was biopsied       with a cold forceps for histology in 4 quadrants at intervals of 2.5 cm       at the gastroesophageal junction. One specimen bottle was sent to       pathology.      Diffuse mild inflammation characterized by erosions and erythema was       found in the gastric antrum.      Suspect gastroparesis due to retained gastric contents.      The examined duodenum was normal.      The exam was otherwise without abnormality. Impression:           - Esophageal mucosal changes secondary to established                        short-segment Barrett's disease. Biopsied.                       - Gastritis.                       - Gastroparesis, idiopathic etiology.                       - Normal examined duodenum.                       - The examination was otherwise normal. Recommendation:       - Await pathology results.                       - Repeat upper endoscopy for surveillance based on                        pathology results.                       - Return to GI office in 1 year.                       - Patient has a contact number available for                        emergencies. The signs and symptoms of potential                        delayed complications were discussed with the patient.                        Return to normal activities tomorrow. Written discharge                        instructions were provided to the patient.                       - Resume previous diet.                       - Continue present medications.                       -  The findings and recommendations were discussed with                        the patient.                       - The findings and recommendations were discussed with                        the patient's family. Procedure Code(s):    --- Professional ---                       [phone], Esophagogastroduodenoscopy, flexible, transoral;                        with biopsy,  single or multiple Diagnosis Code(s):    --- Professional ---                       K22.70, Barrett's esophagus without dysplasia                       K29.70, Gastritis, unspecified, without bleeding                       K31.84, Gastroparesis                       K21.9, Gastro-esophageal reflux disease without                        esophagitis CPT copyright 2016 American Medical Association. All rights reserved. The codes documented in this report are preliminary and upon coder review may  be revised to meet current compliance requirements. Olean Ree, MD Efrain Sella MD, MD 04/13/2017 1:41:29 PM This report has been signed electronically. Number of Addenda: 0 Note Initiated On: 04/13/2017 1:17 PM      [facility]

## 2017-04-13 NOTE — Anesthesia Postprocedure Evaluation (Signed)
Anesthesia Post Note  Patient: Bradley [name]  Procedure(s) Performed: Procedure(s) (LRB): ESOPHAGOGASTRODUODENOSCOPY (EGD) WITH PROPOFOL (N/A)  Patient location during evaluation: Endoscopy Anesthesia Type: General Level of consciousness: awake and alert and oriented Pain management: pain level controlled Vital Signs Assessment: post-procedure vital signs reviewed and stable Respiratory status: spontaneous breathing, nonlabored ventilation and respiratory function stable Cardiovascular status: blood pressure returned to baseline and stable Postop Assessment: no signs of nausea or vomiting Anesthetic complications: no     Last Vitals:  Vitals:   04/13/17 1359 04/13/17 1409  BP: 120/74 140/81  Pulse: (!) 57 63  Resp: 16 17  Temp:    SpO2: 98% 98%    Last Pain:  Vitals:   04/13/17 1339  TempSrc: Tympanic                 [name]

## 2017-04-13 NOTE — H&P (Signed)
Outpatient short stay form Pre-procedure 04/13/2017 12:59 PM Bradley [name], M.D.  Primary Physician: Dr. Ellison Hughs  Reason for visit:  Barrett's esophagus, hx of epigastric pain.  History of present illness:  Patient is a pleasant 76 y/o male here for surveillance for Barrett's esophagus. He has a history that is questionable for gastroparesis. He currently denies nausea, vomiting or abdominal pain. Over the past few days he has had some mild globus sensation in his throat without true dysphagia. He had an episode of dizziness, sweating a couple of weeks ago which subsided with intake of apple juice.     Current Facility-Administered Medications:  .  0.9 %  sodium chloride infusion, , Intravenous, Continuous, [place], Benay Pike, MD  Prescriptions Prior to Admission  Medication Sig Dispense Refill Last Dose  . aspirin 81 MG tablet Take 81 mg by mouth daily.   Past Week at Unknown time  . diphenhydrAMINE (BENADRYL) 25 mg capsule Take 25 mg by mouth every 6 (six) hours as needed for itching.     . fexofenadine (ALLEGRA) 180 MG tablet Take 180 mg by mouth continuous as needed for allergies or rhinitis.     Marland Kitchen omeprazole (PRILOSEC) 20 MG capsule Take 20 mg by mouth daily.   04/12/2017 at Unknown time  . timolol (BETIMOL) 0.25 % ophthalmic solution Place 1 drop into both eyes daily.   04/12/2017 at Unknown time  . atorvastatin (LIPITOR) 20 MG tablet Take 20 mg by mouth daily.   10/18/2015 at Unknown time  . cetirizine (ZYRTEC) 5 MG tablet Take 5 mg by mouth daily.   10/18/2015 at Unknown time  . chlorhexidine (PERIDEX) 0.12 % solution Use as directed 15 mLs in the mouth or throat 2 (two) times daily.   10/18/2015 at Unknown time  . cyanocobalamin 100 MCG tablet Take 100 mcg by mouth daily.   10/18/2015 at Unknown time  . dutasteride (AVODART) 0.5 MG capsule Take 0.5 mg by mouth daily.   10/18/2015 at Unknown time  . hydrocortisone cream 1 % Apply 1 application topically as needed for itching.   prn at prn   . latanoprost (XALATAN) 0.005 % ophthalmic solution Place 1 drop into both eyes at bedtime.   10/18/2015 at Unknown time  . nitroGLYCERIN (NITROSTAT) 0.4 MG SL tablet Place 0.4 mg under the tongue every 5 (five) minutes as needed for chest pain.   prn at prn  . ranitidine (ZANTAC) 75 MG tablet Take 75 mg by mouth at bedtime.     . triamcinolone cream (KENALOG) 0.1 % Apply 1 application topically 2 (two) times daily as needed (Foot).   prn at prn     Allergies  Allergen Reactions  . Metoprolol   . Oxycodone Other (See Comments)    Reaction:  Hypotension   . Sulfadiazine   . Sulfa Antibiotics Rash     Past Medical History:  Diagnosis Date  . Allergy   . Barrett's esophagus   . Blockage of coronary artery of heart ([place])   . BPH (benign prostatic hyperplasia)   . Cataracts, bilateral   . Colon polyp   . Coronary artery disease   . Essential hypertension 06/05/2014  . GERD (gastroesophageal reflux disease)    Barretts esophagus  . GI bleed    a. ~2012 around time of hiatal hernia surgery. 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  . Glaucoma   . Hemorrhoids   . History of chicken pox   . History of hiatal hernia 2012  .  Hyperlipidemia   . Meningioma ([place])    stable, followed by yearly MRIs  . Migraines   . RBBB   . Stomach ulcer     Review of systems:      Physical Exam  Physical Exam  Constitutional: He is oriented to person, place, and time and well-developed, well-nourished, and in no distress.  HENT:  Head: Normocephalic and atraumatic.  Eyes: Pupils are equal, round, and reactive to light.  Neck: Normal range of motion.  Cardiovascular: Normal rate and regular rhythm.   Pulmonary/Chest: Effort normal and breath sounds normal.  Abdominal: Soft. Bowel sounds are normal.  Musculoskeletal: Normal range of motion.  Neurological: He is alert and oriented to person, place, and time. Gait normal.  Skin: Skin is warm and dry.  Psychiatric:  Affect normal.        Planned procedures: EGD with surveillance biopsies. The patient understands the nature of the planned procedure, indications, risks, alternatives and potential complications including but not limited to bleeding, infection, perforation, damage to internal organs and possible oversedation/side effects from anesthesia. The patient agrees and gives consent to proceed.  Please refer to procedure notes for findings, recommendations and patient disposition/instructions.    Bradley [name], M.D. Gastroenterology 04/13/2017  12:59 PM

## 2017-04-13 NOTE — Anesthesia Post-op Follow-up Note (Signed)
Anesthesia QCDR form completed.        

## 2017-04-13 NOTE — Transfer of Care (Signed)
Immediate Anesthesia Transfer of Care Note  Patient: Bradley [name]  Procedure(s) Performed: Procedure(s): ESOPHAGOGASTRODUODENOSCOPY (EGD) WITH PROPOFOL (N/A)  Patient Location: PACU  Anesthesia Type:General  Level of Consciousness: awake, alert  and oriented  Airway & Oxygen Therapy: Patient Spontanous Breathing and Patient connected to nasal cannula oxygen  Post-op Assessment: Report given to RN and Post -op Vital signs reviewed and stable  Post vital signs: Reviewed and stable  Last Vitals:  Vitals:   04/13/17 1250 04/13/17 1339  BP: (!) 169/97 (!) 89/55  Pulse: 66 63  Resp: 16 12  Temp: (!) 36.1 C (!) 36.1 C  SpO2: 99% 96%    Last Pain:  Vitals:   04/13/17 1339  TempSrc: Tympanic         Complications: No apparent anesthesia complications

## 2017-04-13 NOTE — Anesthesia Preprocedure Evaluation (Signed)
Anesthesia Evaluation  Patient identified by MRN, date of birth, ID band Patient awake    Reviewed: Allergy & Precautions, H&P , NPO status , Patient's Chart, lab work & pertinent test results  History of Anesthesia Complications Negative for: history of anesthetic complications  Airway Mallampati: III  TM Distance: <3 FB Neck ROM: limited    Dental  (+) Poor Dentition, Chipped   Pulmonary neg pulmonary ROS, neg shortness of breath,           Cardiovascular Exercise Tolerance: Good hypertension, (-) angina+ CAD and + CABG  + dysrhythmias      Neuro/Psych  Headaches, TIAnegative psych ROS   GI/Hepatic Neg liver ROS, hiatal hernia, PUD, GERD  Medicated and Controlled,  Endo/Other  negative endocrine ROS  Renal/GU negative Renal ROS  negative genitourinary   Musculoskeletal   Abdominal   Peds  Hematology negative hematology ROS (+)   Anesthesia Other Findings Past Medical History: No date: Allergy No date: Barrett's esophagus No date: Blockage of coronary artery of heart ([facility]) No date: BPH (benign prostatic hyperplasia) No date: Cataracts, bilateral No date: Colon polyp No date: Coronary artery disease 06/05/2014: Essential hypertension No date: GERD (gastroesophageal reflux disease)     Comment:  Barretts esophagus No date: GI bleed     Comment:  a. ~2012 around time of hiatal hernia surgery. Developed              melena/BRBPR, was placed back on Dexilant long-term. No date: Glaucoma No date: Hemorrhoids No date: History of chicken pox 2012: History of hiatal hernia No date: Hyperlipidemia No date: Meningioma [facility])     Comment:  stable, followed by yearly MRIs No date: Migraines No date: RBBB No date: Stomach ulcer  Past Surgical History: 1992: ATHERECTOMY 01/07/2015: CARDIAC CATHETERIZATION; N/A     Comment:  Procedure: Left Heart Cath;  Surgeon: Corey Skains,               MD;  Location: [place] [facility];  Service:               Cardiovascular;  Laterality: N/A; No date: CARDIAC SURGERY     Comment:  coronary angioplasty No date: CHOLECYSTECTOMY 11/03/08, 03/09/14: COLONOSCOPY No date: CORONARY ANGIOPLASTY 01/19/2015: CORONARY ARTERY BYPASS GRAFT; N/A     Comment:  Procedure: CORONARY ARTERY BYPASS GRAFTING (CABG), with               LIM to LAD, vein to left intermediate, vein to PD, vein               to OM, using right greater saphenous vein via endovein               harvest.;  Surgeon: Grace Isaac, MD;  Location: [place];  Service: Open Heart Surgery;  Laterality: N/A; No date: DIAGNOSTIC LAPAROSCOPY     Comment:  cholecystectomy and hiatal hernia repair 01/19/2015: ENDOVEIN HARVEST OF GREATER SAPHENOUS VEIN; Right     Comment:  Procedure: ENDOVEIN HARVEST OF GREATER SAPHENOUS VEIN;                Surgeon: Grace Isaac, MD;  Location: [place];                Service: Open Heart Surgery;  Laterality: Right; 02/19/13: ESOPHAGOGASTRODUODENOSCOPY 08/09/2015: ESOPHAGOGASTRODUODENOSCOPY (EGD) WITH PROPOFOL; N/A     Comment:  Procedure: ESOPHAGOGASTRODUODENOSCOPY (EGD)  WITH               PROPOFOL;  Surgeon: Hulen Luster, MD;  Location: [facility]               ENDOSCOPY;  Service: Gastroenterology;  Laterality: N/A; No date: ESOPHAGUS SURGERY No date: EYE SURGERY     Comment:  cataracts BIL No date: HERNIA REPAIR No date: HIATAL HERNIA REPAIR; N/A No date: POLYPECTOMY 01/19/2015: TEE WITHOUT CARDIOVERSION; N/A     Comment:  Procedure: TRANSESOPHAGEAL ECHOCARDIOGRAM (TEE);                Surgeon: Grace Isaac, MD;  Location: [place];                Service: Open Heart Surgery;  Laterality: N/A;     Reproductive/Obstetrics negative OB ROS                             Anesthesia Physical Anesthesia Plan  ASA: III  Anesthesia Plan: General   Post-op Pain Management:    Induction: Intravenous  PONV Risk Score and Plan: Propofol  infusion  Airway Management Planned: Natural Airway and Nasal Cannula  Additional Equipment:   Intra-op Plan:   Post-operative Plan:   Informed Consent: I have reviewed the patients History and Physical, chart, labs and discussed the procedure including the risks, benefits and alternatives for the proposed anesthesia with the patient or authorized representative who has indicated his/her understanding and acceptance.   Dental Advisory Given  Plan Discussed with: Anesthesiologist, CRNA and Surgeon  Anesthesia Plan Comments: (Patient and family informed that patient is higher risk for complications from anesthesia during this procedure due to their medical history and age including but not limited to post operative cognitive dysfunction.  They voiced understanding.  Patient consented for risks of anesthesia including but not limited to:  - adverse reactions to medications - risk of intubation if required - damage to teeth, lips or other oral mucosa - sore throat or hoarseness - Damage to heart, brain, lungs or loss of life  Patient voiced understanding.)        Anesthesia Quick Evaluation

## 2017-04-13 NOTE — Interval H&P Note (Signed)
History and Physical Interval Note:  04/13/2017 1:11 PM  Bradley [name]  has presented today for surgery, with the diagnosis of barretts esophagus  The various methods of treatment have been discussed with the patient and family. After consideration of risks, benefits and other options for treatment, the patient has consented to  Procedure(s): ESOPHAGOGASTRODUODENOSCOPY (EGD) WITH PROPOFOL (N/A) as a surgical intervention .  The patient's history has been reviewed, patient examined, no change in status, stable for surgery.  I have reviewed the patient's chart and labs.  Questions were answered to the patient's satisfaction.     [place], [place]

## 2017-04-15 ENCOUNTER — Encounter: Payer: Self-pay | Admitting: Internal Medicine

## 2017-04-16 LAB — SURGICAL PATHOLOGY

## 2017-05-18 ENCOUNTER — Ambulatory Visit (INDEPENDENT_AMBULATORY_CARE_PROVIDER_SITE_OTHER): Payer: Medicare Other | Admitting: Urology

## 2017-05-18 VITALS — BP 133/79 | HR 62 | Ht 74.0 in | Wt 228.2 lb

## 2017-05-18 DIAGNOSIS — N401 Enlarged prostate with lower urinary tract symptoms: Secondary | ICD-10-CM

## 2017-05-18 DIAGNOSIS — R350 Frequency of micturition: Secondary | ICD-10-CM

## 2017-05-18 DIAGNOSIS — N138 Other obstructive and reflux uropathy: Secondary | ICD-10-CM

## 2017-05-18 LAB — URINALYSIS, COMPLETE
BILIRUBIN UA: NEGATIVE
GLUCOSE, UA: NEGATIVE
Ketones, UA: NEGATIVE
Leukocytes, UA: NEGATIVE
Nitrite, UA: NEGATIVE
PH UA: 5.5 (ref 5.0–7.5)
Protein, UA: NEGATIVE
SPEC GRAV UA: 1.015 (ref 1.005–1.030)
UUROB: 0.2 mg/dL (ref 0.2–1.0)

## 2017-05-18 LAB — BLADDER SCAN AMB NON-IMAGING

## 2017-05-18 MED ORDER — TAMSULOSIN HCL 0.4 MG PO CAPS: 0.4000 mg | ORAL_CAPSULE | Freq: Every day | ORAL | 0 refills | Status: DC

## 2017-05-18 MED ORDER — DUTASTERIDE 0.5 MG PO CAPS: 0.5000 mg | ORAL_CAPSULE | Freq: Every day | ORAL | 6 refills | Status: DC

## 2017-05-18 NOTE — Progress Notes (Signed)
05/18/2017 2:43 PM   Bradley [name] 29-Nov-1940 [id number]  Referring provider: Sofie Hartigan, MD [place], [place] [address]  Chief Complaint  Patient presents with  . Urinary Frequency    HPI: 76 year old male with a long history of BPH and an elevated PSA.  He had a prior microwave thermotherapy in 2007 and PVP in 2008.  Prostate biopsies were performed in 2009 for a PSA of 8.3 with pathology returning benign tissue with a focus of high-grade PIN.  A repeat biopsy was performed in 2011 for an uncorrected PSA of 10.4 with benign pathology.  He is taking Avodart every other day.  His last PSA was in October 2017 and was stable at 3.01 (uncorrected).  Over the last several months he has noted increased urinary frequency.  He estimates he goes every 2-3 hours during the day and has variable nocturia from 0-3.  He will have a urgency with occasional small volume urge incontinence and states he will lose a few drops of urine.  He does note urinary hesitancy and mild decreased force and caliber of his urinary stream.  Denies dysuria or gross hematuria.  Denies flank, abdominal, pelvic or scrotal pain.   He is also had intermittent scrotal erythema and itching.  I treated him with triamcinolone last year.  He has been treated by his primary provider and by dermatology with Lotrisone and Eucrisa without improvement.   PMH: Past Medical History:  Diagnosis Date  . Allergy   . Barrett's esophagus   . Blockage of coronary artery of heart ([place])   . BPH (benign prostatic hyperplasia)   . Cataracts, bilateral   . Colon polyp   . Coronary artery disease   . Essential hypertension 06/05/2014  . GERD (gastroesophageal reflux disease)    Barretts esophagus  . GI bleed    a. ~2012 around time of hiatal hernia surgery. 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  . Glaucoma   . Hemorrhoids   . History of chicken pox   . History of hiatal hernia 2012  .  Hyperlipidemia   . Meningioma ([place])    stable, followed by yearly MRIs  . Migraines   . RBBB   . Stomach ulcer     Surgical History: Past Surgical History:  Procedure Laterality Date  . ATHERECTOMY  1992  . CARDIAC CATHETERIZATION N/A 01/07/2015   Procedure: Left Heart Cath;  Surgeon: Corey Skains, MD;  Location: [facility];  Service: Cardiovascular;  Laterality: N/A;  . CARDIAC SURGERY     coronary angioplasty  . CHOLECYSTECTOMY    . COLONOSCOPY  11/03/08, 03/09/14  . CORONARY ANGIOPLASTY    . CORONARY ARTERY BYPASS GRAFT N/A 01/19/2015   Procedure: CORONARY ARTERY BYPASS GRAFTING (CABG), with LIM to LAD, vein to left intermediate, vein to PD, vein to OM, using right greater saphenous vein via endovein harvest.;  Surgeon: Grace Isaac, MD;  Location: [facility];  Service: Open Heart Surgery;  Laterality: N/A;  . DIAGNOSTIC LAPAROSCOPY     cholecystectomy and hiatal hernia repair  . ENDOVEIN HARVEST OF GREATER SAPHENOUS VEIN Right 01/19/2015   Procedure: ENDOVEIN HARVEST OF GREATER SAPHENOUS VEIN;  Surgeon: Grace Isaac, MD;  Location: [facility];  Service: Open Heart Surgery;  Laterality: Right;  . ESOPHAGOGASTRODUODENOSCOPY  02/19/13  . ESOPHAGOGASTRODUODENOSCOPY (EGD) WITH PROPOFOL N/A 08/09/2015   Procedure: ESOPHAGOGASTRODUODENOSCOPY (EGD) WITH PROPOFOL;  Surgeon: Hulen Luster, MD;  [facility];  Service: Gastroenterology;  Laterality: N/A;  . ESOPHAGOGASTRODUODENOSCOPY (EGD) WITH PROPOFOL N/A 04/13/2017   Procedure: ESOPHAGOGASTRODUODENOSCOPY (EGD) WITH PROPOFOL;  Surgeon: Toledo, Benay Pike, MD;  Location: [facility] ENDOSCOPY;  Service: Endoscopy;  Laterality: N/A;  . ESOPHAGUS SURGERY    . EYE SURGERY     cataracts BIL  . HERNIA REPAIR    . HIATAL HERNIA REPAIR N/A   . POLYPECTOMY    . TEE WITHOUT CARDIOVERSION N/A 01/19/2015   Procedure: TRANSESOPHAGEAL ECHOCARDIOGRAM (TEE);  Surgeon: Grace Isaac, MD;  Location: [place];  Service: Open Heart Surgery;   Laterality: N/A;    Home Medications:  Allergies as of 05/18/2017      Reactions   Metoprolol    Oxycodone Other (See Comments)   Reaction:  Hypotension    Sulfadiazine    Sulfa Antibiotics Rash      Medication List       Accurate as of 05/18/17  2:43 PM. Always use your most recent med list.          aspirin 81 MG tablet Take 81 mg by mouth daily.   atorvastatin 20 MG tablet Commonly known as:  LIPITOR Take 20 mg by mouth daily.   cetirizine 5 MG tablet Commonly known as:  ZYRTEC Take 5 mg by mouth daily.   chlorhexidine 0.12 % solution Commonly known as:  PERIDEX Use as directed 15 mLs in the mouth or throat 2 (two) times daily.   cyanocobalamin 100 MCG tablet Take 100 mcg by mouth daily.   diphenhydrAMINE 25 mg capsule Commonly known as:  BENADRYL Take 25 mg by mouth every 6 (six) hours as needed for itching.   dutasteride 0.5 MG capsule Commonly known as:  AVODART Take 0.5 mg by mouth daily.   hydrocortisone cream 1 % Apply 1 application topically as needed for itching.   latanoprost 0.005 % ophthalmic solution Commonly known as:  XALATAN Place 1 drop into both eyes at bedtime.   nitroGLYCERIN 0.4 MG SL tablet Commonly known as:  NITROSTAT Place 0.4 mg under the tongue every 5 (five) minutes as needed for chest pain.   omeprazole 20 MG capsule Commonly known as:  PRILOSEC Take 20 mg by mouth daily.   ranitidine 75 MG tablet Commonly known as:  ZANTAC Take 75 mg by mouth at bedtime.   tamsulosin 0.4 MG Caps capsule Commonly known as:  FLOMAX Take 1 capsule (0.4 mg total) by mouth daily.   timolol 0.25 % ophthalmic solution Commonly known as:  BETIMOL Place 1 drop into both eyes daily.   triamcinolone cream 0.1 % Commonly known as:  KENALOG Apply 1 application topically 2 (two) times daily as needed ([place]).       Allergies:  Allergies  Allergen Reactions  . Metoprolol   . Oxycodone Other (See Comments)    Reaction:  Hypotension     . Sulfadiazine   . Sulfa Antibiotics Rash    Family History: Family History  Problem Relation Age of Onset  . Parkinsonism Mother   . Kidney disease Father   . Cancer Father        prostate  . Heart disease Father   . Cancer Paternal Uncle        esophageal  . Cancer Paternal Uncle     Social History:  reports that he has never smoked. He has never used smokeless tobacco. He reports that he does not drink alcohol or use drugs.  ROS: UROLOGY Frequent Urination?: Yes Hard to postpone urination?: Yes Burning/pain with urination?:  No Get up at night to urinate?: Yes Leakage of urine?: Yes Urine stream starts and stops?: No Trouble starting stream?: No Do you have to strain to urinate?: No Blood in urine?: No Urinary tract infection?: No Sexually transmitted disease?: No Injury to kidneys or bladder?: No Painful intercourse?: No Weak stream?: Yes Erection problems?: Yes Penile pain?: No  Gastrointestinal Nausea?: No Vomiting?: No Indigestion/heartburn?: No Diarrhea?: No Constipation?: No  Constitutional Fever: No Night sweats?: Yes Weight loss?: No Fatigue?: Yes  Skin Skin rash/lesions?: Yes Itching?: Yes  Eyes Blurred vision?: No Double vision?: No  Ears/Nose/Throat Sore throat?: No Sinus problems?: No  Hematologic/Lymphatic Swollen glands?: No Easy bruising?: Yes  Cardiovascular Leg swelling?: No Chest pain?: No  Respiratory Cough?: No Shortness of breath?: No  Endocrine Excessive thirst?: No  Musculoskeletal Back pain?: Yes Joint pain?: No  Neurological Headaches?: No Dizziness?: No  Psychologic Depression?: No Anxiety?: No  Physical Exam: BP 133/79 (BP Location: Right Arm, Patient Position: Sitting, Cuff Size: Large)   Pulse 62   Ht 6\' 2"  (1.88 m)   Wt 228 lb 3.2 oz (103.5 kg)   BMI 29.30 kg/m   Constitutional:  Alert and oriented, No acute distress. HEENT: [place] AT, moist mucus membranes.  Trachea midline, no  masses. Cardiovascular: No clubbing, cyanosis, or edema. Respiratory: Normal respiratory effort, no increased work of breathing. GI: Abdomen is soft, nontender, nondistended, no abdominal masses GU: No CVA tenderness.  Penis without lesions.  Testes descended bilaterally without masses or tenderness.  Mild scrotal erythema without lesions or skin thickening.  Prostate 50 g, smooth without nodules. Skin: No rashes, bruises or suspicious lesions. Lymph: No cervical or inguinal adenopathy. Neurologic: Grossly intact, no focal deficits, moving all 4 extremities. Psychiatric: Normal mood and affect.  Laboratory Data: Lab Results  Component Value Date   WBC 10.8 (H) 11/08/2015   HGB 16.2 11/08/2015   HCT 47.3 11/08/2015   MCV 96.4 11/08/2015   PLT 199 11/08/2015    Lab Results  Component Value Date   CREATININE 1.48 (H) 11/08/2015    Lab Results  Component Value Date   HGBA1C 5.9 (H) 01/15/2015    Urinalysis Dipstick/microscopy without significant abnormalities   Assessment & Plan:    1. Urinary frequency Worsening lower urinary tract symptoms.  PVR by bladder scan was 15 mL.  Urinalysis was unremarkable.  Will add tamsulosin and he was instructed to call back in 1 month regarding efficacy of this medication.  If no significant improvement will give a trial of Myrbetriq.  - Urinalysis, Complete - Bladder Scan (Post Void Residual) in office  2. BPH with obstruction/lower urinary tract symptoms/elevated PSA PSA was drawn and if stable will continue annual follow-up.  Continue Avodart which was refilled. - [place], MD  [facility] [address], [place], [place] [address] [phone]

## 2017-05-19 LAB — PSA: Prostate Specific Ag, Serum: 4.6 ng/mL — ABNORMAL HIGH (ref 0.0–4.0)

## 2017-05-22 ENCOUNTER — Telehealth: Payer: Self-pay

## 2017-05-22 NOTE — Telephone Encounter (Signed)
Spoke with pt in reference to PSA results. Pt voiced understanding.  

## 2017-05-22 NOTE — Telephone Encounter (Signed)
-----   Message from Abbie Sons, MD sent at 05/22/2017 11:37 AM EST ----- PSA slightly higher at 4.6 baseline.  Follow-up annually

## 2017-06-18 ENCOUNTER — Ambulatory Visit: Payer: Self-pay | Admitting: Urology

## 2017-06-21 ENCOUNTER — Encounter: Payer: Self-pay | Admitting: Urology

## 2017-06-21 ENCOUNTER — Ambulatory Visit (INDEPENDENT_AMBULATORY_CARE_PROVIDER_SITE_OTHER): Payer: Medicare Other | Admitting: Urology

## 2017-06-21 VITALS — BP 125/66 | HR 62 | Ht 74.0 in | Wt 224.0 lb

## 2017-06-21 DIAGNOSIS — R972 Elevated prostate specific antigen [PSA]: Secondary | ICD-10-CM

## 2017-06-21 DIAGNOSIS — R3915 Urgency of urination: Secondary | ICD-10-CM

## 2017-06-21 DIAGNOSIS — R350 Frequency of micturition: Secondary | ICD-10-CM | POA: Diagnosis not present

## 2017-06-21 MED ORDER — MIRABEGRON ER 50 MG PO TB24: 50.0000 mg | ORAL_TABLET | Freq: Every day | ORAL | 0 refills | Status: DC

## 2017-06-21 NOTE — Progress Notes (Signed)
06/21/2017 2:41 PM   Bradley [name] [birth year] [id number]  Referring provider: Sofie Hartigan, MD [place], [place] [address]  Chief Complaint  Patient presents with  . Urinary Frequency    1 month recheck    HPI: Bradley [name] presents for follow-up of urinary frequency and urgency.  Refer to my previous office note dated 05/18/2017.  He saw no improvement in his voiding symptoms on tamsulosin.  He remains on dutasteride.  PMH: Past Medical History:  Diagnosis Date  . Allergy   . Barrett's esophagus   . Blockage of coronary artery of heart ([place])   . BPH (benign prostatic hyperplasia)   . Cataracts, bilateral   . Colon polyp   . Coronary artery disease   . Essential hypertension 06/05/2014  . GERD (gastroesophageal reflux disease)    Barretts esophagus  . GI bleed    a. ~2012 around time of hiatal hernia surgery. 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  . Glaucoma   . Hemorrhoids   . History of chicken pox   . History of hiatal hernia 2012  . Hyperlipidemia   . Meningioma ([place])    stable, followed by yearly MRIs  . Migraines   . RBBB   . Stomach ulcer     Surgical History: Past Surgical History:  Procedure Laterality Date  . ATHERECTOMY  1992  . CARDIAC CATHETERIZATION N/A 01/07/2015   Procedure: Left Heart Cath;  Surgeon: Corey Skains, MD;  Location: [facility];  Service: Cardiovascular;  Laterality: N/A;  . CARDIAC SURGERY     coronary angioplasty  . CHOLECYSTECTOMY    . COLONOSCOPY  11/03/08, 03/09/14  . CORONARY ANGIOPLASTY    . CORONARY ARTERY BYPASS GRAFT N/A 01/19/2015   Procedure: CORONARY ARTERY BYPASS GRAFTING (CABG), with LIM to LAD, vein to left intermediate, vein to PD, vein to OM, using right greater saphenous vein via endovein harvest.;  Surgeon: Grace Isaac, MD;  Location: [facility];  Service: Open Heart Surgery;  Laterality: N/A;  . DIAGNOSTIC LAPAROSCOPY     cholecystectomy and hiatal hernia  repair  . ENDOVEIN HARVEST OF GREATER SAPHENOUS VEIN Right 01/19/2015   Procedure: ENDOVEIN HARVEST OF GREATER SAPHENOUS VEIN;  Surgeon: Grace Isaac, MD;  Location: [facility];  Service: Open Heart Surgery;  Laterality: Right;  . ESOPHAGOGASTRODUODENOSCOPY  02/19/13  . ESOPHAGOGASTRODUODENOSCOPY (EGD) WITH PROPOFOL N/A 08/09/2015   Procedure: ESOPHAGOGASTRODUODENOSCOPY (EGD) WITH PROPOFOL;  Surgeon: Hulen Luster, MD;  [facility] END[facility];  Service: Gastroenterology;  Laterality: N/A;  . ESOPHAGOGASTRODUODENOSCOPY (EGD) WITH PROPOFOL N/A 04/13/2017   Procedure: ESOPHAGOGASTRODUODENOSCOPY (EGD) WITH PROPOFOL;  Surgeon: Toledo, Benay Pike, MD;  Location: [facility] ENDOSCOPY;  Service: Endoscopy;  Laterality: N/A;  . ESOPHAGUS SURGERY    . EYE SURGERY     cataracts BIL  . HERNIA REPAIR    . HIATAL HERNIA REPAIR N/A   . POLYPECTOMY    . TEE WITHOUT CARDIOVERSION N/A 01/19/2015   Procedure: TRANSESOPHAGEAL ECHOCARDIOGRAM (TEE);  Surgeon: Grace Isaac, MD;  Location: [place];  Service: Open Heart Surgery;  Laterality: N/A;    Home Medications:  Allergies as of 06/21/2017      Reactions   Metoprolol    Oxycodone Other (See Comments)   Reaction:  Hypotension    Sulfadiazine    Sulfa Antibiotics Rash      Medication List        Accurate as of 06/21/17  2:41 PM. Always use your most  recent med list.          aspirin 81 MG tablet Take 81 mg by mouth daily.   atorvastatin 20 MG tablet Commonly known as:  LIPITOR Take 20 mg by mouth daily.   cetirizine 5 MG tablet Commonly known as:  ZYRTEC Take 5 mg by mouth daily.   chlorhexidine 0.12 % solution Commonly known as:  PERIDEX Use as directed 15 mLs in the mouth or throat 2 (two) times daily.   cyanocobalamin 100 MCG tablet Take 100 mcg by mouth daily.   diphenhydrAMINE 25 mg capsule Commonly known as:  BENADRYL Take 25 mg by mouth every 6 (six) hours as needed for itching.   dutasteride 0.5 MG capsule Commonly known as:   AVODART Take 1 capsule (0.5 mg total) by mouth daily.   hydrocortisone cream 1 % Apply 1 application topically as needed for itching.   latanoprost 0.005 % ophthalmic solution Commonly known as:  XALATAN Place 1 drop into both eyes at bedtime.   nitroGLYCERIN 0.4 MG SL tablet Commonly known as:  NITROSTAT Place 0.4 mg under the tongue every 5 (five) minutes as needed for chest pain.   omeprazole 20 MG capsule Commonly known as:  PRILOSEC Take 20 mg by mouth daily.   ranitidine 75 MG tablet Commonly known as:  ZANTAC Take 75 mg by mouth at bedtime.   tamsulosin 0.4 MG Caps capsule Commonly known as:  FLOMAX Take 1 capsule (0.4 mg total) by mouth daily.   timolol 0.25 % ophthalmic solution Commonly known as:  BETIMOL Place 1 drop into both eyes daily.   triamcinolone cream 0.1 % Commonly known as:  KENALOG Apply 1 application topically 2 (two) times daily as needed (Foot).       Allergies:  Allergies  Allergen Reactions  . Metoprolol   . Oxycodone Other (See Comments)    Reaction:  Hypotension   . Sulfadiazine   . Sulfa Antibiotics Rash    Family History: Family History  Problem Relation Age of Onset  . Parkinsonism Mother   . Kidney disease Father   . Cancer Father        prostate  . Heart disease Father   . Cancer Paternal Uncle        esophageal  . Cancer Paternal Uncle     Social History:  reports that  has never smoked. he has never used smokeless tobacco. He reports that he does not drink alcohol or use drugs.  ROS: UROLOGY Frequent Urination?: Yes Hard to postpone urination?: Yes Burning/pain with urination?: No Get up at night to urinate?: Yes Leakage of urine?: No Urine stream starts and stops?: No Trouble starting stream?: No Do you have to strain to urinate?: No Blood in urine?: No Urinary tract infection?: No Sexually transmitted disease?: No Injury to kidneys or bladder?: No Painful intercourse?: No Weak stream?: No Erection  problems?: No Penile pain?: No  Gastrointestinal Nausea?: No Vomiting?: No Indigestion/heartburn?: No Diarrhea?: Yes Constipation?: Yes  Constitutional Fever: No Night sweats?: No Weight loss?: No Fatigue?: No  Skin Skin rash/lesions?: No Itching?: Yes  Eyes Blurred vision?: No Double vision?: No  Ears/Nose/Throat Sore throat?: No Sinus problems?: No  Hematologic/Lymphatic Swollen glands?: No Easy bruising?: Yes  Cardiovascular Leg swelling?: No Chest pain?: No  Respiratory Cough?: No Shortness of breath?: No  Endocrine Excessive thirst?: No  Musculoskeletal Back pain?: Yes Joint pain?: No  Neurological Headaches?: No Dizziness?: No  Psychologic Depression?: No Anxiety?: No  Physical Exam: BP 125/66  Pulse 62   Ht 6\' 2"  (1.88 m)   Wt 224 lb (101.6 kg)   BMI 28.76 kg/m   Constitutional:  Alert and oriented, No acute distress. HEENT: [place] AT, moist mucus membranes.  Trachea midline, no masses. Cardiovascular: No clubbing, cyanosis, or edema. Respiratory: Normal respiratory effort, no increased work of breathing. GI: Abdomen is soft, nontender, nondistended, no abdominal masses GU: No CVA tenderness.  Skin: No rashes, bruises or suspicious lesions. Lymph: No cervical or inguinal adenopathy. Neurologic: Grossly intact, no focal deficits, moving all 4 extremities. Psychiatric: Normal mood and affect.  Laboratory Data: Lab Results  Component Value Date   WBC 10.8 (H) 11/08/2015   HGB 16.2 11/08/2015   HCT 47.3 11/08/2015   MCV 96.4 11/08/2015   PLT 199 11/08/2015    Lab Results  Component Value Date   CREATININE 1.48 (H) 11/08/2015    Lab Results  Component Value Date   PSA1 4.6 (H) 05/18/2017     Lab Results  Component Value Date   HGBA1C 5.9 (H) 01/15/2015    Urinalysis Lab Results  Component Value Date   SPECGRAV 1.015 05/18/2017   PHUR 5.5 05/18/2017   COLORU Yellow 05/18/2017   APPEARANCEUR Clear 05/18/2017    LEUKOCYTESUR Negative 05/18/2017   PROTEINUR Negative 05/18/2017   GLUCOSEU Negative 05/18/2017   KETONESU Negative 05/18/2017   RBCU Trace (A) 05/18/2017   BILIRUBINUR Negative 05/18/2017   UUROB 0.2 05/18/2017   NITRITE Negative 05/18/2017    Lab Results  Component Value Date   BACTERIA NONE SEEN 08/25/2011    Assessment & Plan:   He saw no improvement in his storage related symptoms on tamsulosin.  Trial of Myrbetriq 50 mg daily.  He was given samples initially.  He will call back in 1 month regarding efficacy.  PSA at last visit was slightly above baseline at 4.6.  Recommend repeating in approximately 4 months.   1. Urinary frequency   2. Urinary urgency   Abbie Sons, MD  [facility] [address], [place], [place] [address] [phone]

## 2017-07-25 ENCOUNTER — Encounter: Payer: Self-pay | Admitting: Urology

## 2017-07-25 ENCOUNTER — Ambulatory Visit (INDEPENDENT_AMBULATORY_CARE_PROVIDER_SITE_OTHER): Payer: Medicare Other | Admitting: Urology

## 2017-07-25 VITALS — BP 124/75 | HR 61 | Ht 74.0 in | Wt 232.1 lb

## 2017-07-25 DIAGNOSIS — R350 Frequency of micturition: Secondary | ICD-10-CM | POA: Diagnosis not present

## 2017-07-26 NOTE — Progress Notes (Signed)
07/25/2017 7:28 AM   Bradley [name] 23-Sep-1940 [id number]  Referring provider: Sofie Hartigan, MD [place], [place] [address]  Chief Complaint  Patient presents with  . Urinary Frequency    HPI: 77 year old male recently seen for worsening urinary frequency and urgency.  He is on dutasteride for BPH.  Tamsulosin was added and he saw no improvement.  At his last follow-up in early December he was given a trial of Myrbetriq and states he did not see a significant benefit.  He states his voiding pattern is stable and he does not desire any additional treatment at this time.   PMH: Past Medical History:  Diagnosis Date  . Allergy   . Barrett's esophagus   . Blockage of coronary artery of heart ([place])   . BPH (benign prostatic hyperplasia)   . Cataracts, bilateral   . Colon polyp   . Coronary artery disease   . Essential hypertension 06/05/2014  . GERD (gastroesophageal reflux disease)    Barretts esophagus  . GI bleed    a. ~2012 around time of hiatal hernia surgery. 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  . Glaucoma   . Hemorrhoids   . History of chicken pox   . History of hiatal hernia 2012  . Hyperlipidemia   . Meningioma ([place])    stable, followed by yearly MRIs  . Migraines   . RBBB   . Stomach ulcer     Surgical History: Past Surgical History:  Procedure Laterality Date  . ATHERECTOMY  1992  . CARDIAC CATHETERIZATION N/A 01/07/2015   Procedure: Left Heart Cath;  Surgeon: Corey Skains, MD;  Location: [facility];  Service: Cardiovascular;  Laterality: N/A;  . CARDIAC SURGERY     coronary angioplasty  . CHOLECYSTECTOMY    . COLONOSCOPY  11/03/08, 03/09/14  . CORONARY ANGIOPLASTY    . CORONARY ARTERY BYPASS GRAFT N/A 01/19/2015   Procedure: CORONARY ARTERY BYPASS GRAFTING (CABG), with LIM to LAD, vein to left intermediate, vein to PD, vein to OM, using right greater saphenous vein via endovein harvest.;  Surgeon:  Grace Isaac, MD;  Location: [facility];  Service: Open Heart Surgery;  Laterality: N/A;  . DIAGNOSTIC LAPAROSCOPY     cholecystectomy and hiatal hernia repair  . ENDOVEIN HARVEST OF GREATER SAPHENOUS VEIN Right 01/19/2015   Procedure: ENDOVEIN HARVEST OF GREATER SAPHENOUS VEIN;  Surgeon: Grace Isaac, MD;  Location: [facility];  Service: Open Heart Surgery;  Laterality: Right;  . ESOPHAGOGASTRODUODENOSCOPY  02/19/13  . ESOPHAGOGASTRODUODENOSCOPY (EGD) WITH PROPOFOL N/A 08/09/2015   Procedure: ESOPHAGOGASTRODUODENOSCOPY (EGD) WITH PROPOFOL;  Surgeon: Hulen Luster, MD;  [facility];  Service: Gastroenterology;  Laterality: N/A;  . ESOPHAGOGASTRODUODENOSCOPY (EGD) WITH PROPOFOL N/A 04/13/2017   Procedure: ESOPHAGOGASTRODUODENOSCOPY (EGD) WITH PROPOFOL;  Surgeon: Toledo, Benay Pike, MD;  Location: [facility] ENDOSCOPY;  Service: Endoscopy;  Laterality: N/A;  . ESOPHAGUS SURGERY    . EYE SURGERY     cataracts BIL  . HERNIA REPAIR    . HIATAL HERNIA REPAIR N/A   . POLYPECTOMY    . TEE WITHOUT CARDIOVERSION N/A 01/19/2015   Procedure: TRANSESOPHAGEAL ECHOCARDIOGRAM (TEE);  Surgeon: Grace Isaac, MD;  Location: [facility];  Service: Open Heart Surgery;  Laterality: N/A;    Home Medications:  Allergies as of 07/25/2017      Reactions   Metoprolol    Oxycodone Other (See Comments)   Reaction:  Hypotension    Sulfadiazine  Sulfa Antibiotics Rash      Medication List        Accurate as of 07/25/17 11:59 PM. Always use your most recent med list.          aspirin 81 MG tablet Take 81 mg by mouth daily.   atorvastatin 20 MG tablet Commonly known as:  LIPITOR Take 20 mg by mouth daily.   cetirizine 5 MG tablet Commonly known as:  ZYRTEC Take 5 mg by mouth daily.   chlorhexidine 0.12 % solution Commonly known as:  PERIDEX Use as directed 15 mLs in the mouth or throat 2 (two) times daily.   cyanocobalamin 100 MCG tablet Take 100 mcg by mouth daily.   diphenhydrAMINE 25 mg  capsule Commonly known as:  BENADRYL Take 25 mg by mouth every 6 (six) hours as needed for itching.   dutasteride 0.5 MG capsule Commonly known as:  AVODART Take 1 capsule (0.5 mg total) by mouth daily.   hydrocortisone cream 1 % Apply 1 application topically as needed for itching.   latanoprost 0.005 % ophthalmic solution Commonly known as:  XALATAN Place 1 drop into both eyes at bedtime.   nitroGLYCERIN 0.4 MG SL tablet Commonly known as:  NITROSTAT Place 0.4 mg under the tongue every 5 (five) minutes as needed for chest pain.   omeprazole 20 MG capsule Commonly known as:  PRILOSEC Take 20 mg by mouth daily.   ranitidine 75 MG tablet Commonly known as:  ZANTAC Take 75 mg by mouth at bedtime.   timolol 0.25 % ophthalmic solution Commonly known as:  BETIMOL Place 1 drop into both eyes daily.   triamcinolone cream 0.1 % Commonly known as:  KENALOG Apply 1 application topically 2 (two) times daily as needed (Foot).       Allergies:  Allergies  Allergen Reactions  . Metoprolol   . Oxycodone Other (See Comments)    Reaction:  Hypotension   . Sulfadiazine   . Sulfa Antibiotics Rash    Family History: Family History  Problem Relation Age of Onset  . Parkinsonism Mother   . Kidney disease Father   . Cancer Father        prostate  . Heart disease Father   . Cancer Paternal Uncle        esophageal  . Cancer Paternal Uncle     Social History:  reports that  has never smoked. he has never used smokeless tobacco. He reports that he does not drink alcohol or use drugs.  ROS: UROLOGY Frequent Urination?: Yes Hard to postpone urination?: Yes Burning/pain with urination?: No Get up at night to urinate?: Yes Leakage of urine?: No Urine stream starts and stops?: No Trouble starting stream?: No Do you have to strain to urinate?: No Blood in urine?: No Urinary tract infection?: No Sexually transmitted disease?: No Injury to kidneys or bladder?: No Painful  intercourse?: No Weak stream?: No Erection problems?: No Penile pain?: No  Gastrointestinal Nausea?: No Vomiting?: No Indigestion/heartburn?: No Diarrhea?: Yes Constipation?: No  Constitutional Fever: No Night sweats?: No Weight loss?: No Fatigue?: No  Skin Skin rash/lesions?: No Itching?: Yes  Eyes Blurred vision?: No Double vision?: No  Ears/Nose/Throat Sore throat?: No Sinus problems?: No  Hematologic/Lymphatic Swollen glands?: No Easy bruising?: No  Cardiovascular Leg swelling?: No Chest pain?: No  Respiratory Cough?: No Shortness of breath?: No  Endocrine Excessive thirst?: No  Musculoskeletal Back pain?: Yes Joint pain?: No  Neurological Headaches?: No Dizziness?: No  Psychologic Depression?: No Anxiety?:  No  Physical Exam: BP 124/75 (BP Location: Right Arm, Patient Position: Sitting, Cuff Size: Normal)   Pulse 61   Ht 6\' 2"  (1.88 m)   Wt 232 lb 1.6 oz (105.3 kg)   BMI 29.80 kg/m   Constitutional:  Alert and oriented, No acute distress. HEENT: [place] AT, moist mucus membranes.  Trachea midline, no masses. Cardiovascular: No clubbing, cyanosis, or edema. Respiratory: Normal respiratory effort, no increased work of breathing. GI: Abdomen is soft, nontender, nondistended, no abdominal masses GU: No CVA tenderness.  Skin: No rashes, bruises or suspicious lesions. Lymph: No cervical or inguinal adenopathy. Neurologic: Grossly intact, no focal deficits, moving all 4 extremities. Psychiatric: Normal mood and affect.  Laboratory Data: Lab Results  Component Value Date   WBC 10.8 (H) 11/08/2015   HGB 16.2 11/08/2015   HCT 47.3 11/08/2015   MCV 96.4 11/08/2015   PLT 199 11/08/2015    Lab Results  Component Value Date   CREATININE 1.48 (H) 11/08/2015    Lab Results  Component Value Date   PSA1 4.6 (H) 05/18/2017    Lab Results  Component Value Date   HGBA1C 5.9 (H) 01/15/2015    Assessment & Plan:   Stable urinary frequency  and urgency.  He will continue dutasteride.  He will keep his regularly scheduled follow-up and return as needed for any significant change in his symptoms.   Return in about 6 months (around 01/22/2018) for Recheck.  Abbie Sons, [place] [address], [place], [place] [address] [phone]

## 2017-08-06 ENCOUNTER — Telehealth: Payer: Self-pay | Admitting: Urology

## 2017-08-06 NOTE — Telephone Encounter (Signed)
Pt saw pcp and they told him he needed to follow up w/[facility] (he has appt this Thursday) but pt wanted to know if it was necessary to keep appt.  Please call pt and let him know.

## 2017-08-08 NOTE — Telephone Encounter (Signed)
Attempted to call pt. Line was busy.

## 2017-08-09 ENCOUNTER — Ambulatory Visit (INDEPENDENT_AMBULATORY_CARE_PROVIDER_SITE_OTHER): Payer: Medicare Other | Admitting: Urology

## 2017-08-09 ENCOUNTER — Encounter: Payer: Self-pay | Admitting: Urology

## 2017-08-09 VITALS — BP 148/74 | HR 72 | Ht 75.0 in | Wt 230.0 lb

## 2017-08-09 DIAGNOSIS — R310 Gross hematuria: Secondary | ICD-10-CM | POA: Diagnosis not present

## 2017-08-09 LAB — URINALYSIS, COMPLETE
Bilirubin, UA: NEGATIVE
Nitrite, UA: POSITIVE — AB
PH UA: 5.5 (ref 5.0–7.5)
Specific Gravity, UA: 1.02 (ref 1.005–1.030)
Urobilinogen, Ur: 2 mg/dL — ABNORMAL HIGH (ref 0.2–1.0)

## 2017-08-09 LAB — MICROSCOPIC EXAMINATION: RBC, UA: >30 /hpf — ABNORMAL HIGH (ref 0–?)

## 2017-08-09 NOTE — Progress Notes (Signed)
08/09/2017 4:05 PM   Bradley [name] 09-14-1940 [id number]  Referring provider: Sofie Hartigan, MD Bradley [name],  [id number]  Chief Complaint  Patient presents with  . Hematuria    HPI: 77 year old male presents for evaluation of hematuria.  I have seen him on several visits the last few months for urinary frequency and urgency.  He was seen on 1/9 and his voiding symptoms were stable and he did not desire any medication changes.  Several days after that visit he had intermittent gross hematuria for 3-4 days.  He had no fever, chills, dysuria or change in his baseline voiding symptoms.  He had a similar episode approximately 6 months ago.  He was seen by his PCP on 1/15 and urinalysis was clear.  He has no complaints today.   PMH: Past Medical History:  Diagnosis Date  . Allergy   . Barrett's esophagus   . Blockage of coronary artery of heart ([place])   . BPH (benign prostatic hyperplasia)   . Cataracts, bilateral   . Colon polyp   . Coronary artery disease   . Essential hypertension 06/05/2014  . GERD (gastroesophageal reflux disease)    Barretts esophagus  . GI bleed    a. ~2012 around time of hiatal hernia surgery. 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  . Glaucoma   . Hemorrhoids   . History of chicken pox   . History of hiatal hernia 2012  . Hyperlipidemia   . Meningioma ([place])    stable, followed by yearly MRIs  . Migraines   . RBBB   . Stomach ulcer     Surgical History: Past Surgical History:  Procedure Laterality Date  . ATHERECTOMY  1992  . CARDIAC CATHETERIZATION N/A 01/07/2015   Procedure: Left Heart Cath;  Surgeon: Corey Skains, MD;  Location: [facility];  Service: Cardiovascular;  Laterality: N/A;  . CARDIAC SURGERY     coronary angioplasty  . CHOLECYSTECTOMY    . COLONOSCOPY  11/03/08, 03/09/14  . CORONARY ANGIOPLASTY    . CORONARY ARTERY BYPASS GRAFT N/A 01/19/2015   Procedure: CORONARY ARTERY  BYPASS GRAFTING (CABG), with LIM to LAD, vein to left intermediate, vein to PD, vein to OM, using right greater saphenous vein via endovein harvest.;  Surgeon: Grace Isaac, MD;  Location: [facility];  Service: Open Heart Surgery;  Laterality: N/A;  . DIAGNOSTIC LAPAROSCOPY     cholecystectomy and hiatal hernia repair  . ENDOVEIN HARVEST OF GREATER SAPHENOUS VEIN Right 01/19/2015   Procedure: ENDOVEIN HARVEST OF GREATER SAPHENOUS VEIN;  Surgeon: Grace Isaac, MD;  Location: [facility];  Service: Open Heart Surgery;  Laterality: Right;  . ESOPHAGOGASTRODUODENOSCOPY  02/19/13  . ESOPHAGOGASTRODUODENOSCOPY (EGD) WITH PROPOFOL N/A 08/09/2015   Procedure: ESOPHAGOGASTRODUODENOSCOPY (EGD) WITH PROPOFOL;  Surgeon: Hulen Luster, MD;  Location[facility];  Service: Gastroenterology;  Laterality: N/A;  . ESOPHAGOGASTRODUODENOSCOPY (EGD) WITH PROPOFOL N/A 04/13/2017   Procedure: ESOPHAGOGASTRODUODENOSCOPY (EGD) WITH PROPOFOL;  Surgeon: Toledo, Benay Pike, MD;  Location: [facility] ENDOSCOPY;  Service: Endoscopy;  Laterality: N/A;  . ESOPHAGUS SURGERY    . EYE SURGERY     cataracts BIL  . HERNIA REPAIR    . HIATAL HERNIA REPAIR N/A   . POLYPECTOMY    . TEE WITHOUT CARDIOVERSION N/A 01/19/2015   Procedure: TRANSESOPHAGEAL ECHOCARDIOGRAM (TEE);  Surgeon: Grace Isaac, MD;  Location: [facility];  Service: Open Heart Surgery;  Laterality: N/A;    Home Medications:  Allergies as of 08/09/2017      Reactions   Metoprolol    Oxycodone Other (See Comments)   Reaction:  Hypotension    Sulfadiazine    Sulfa Antibiotics Rash      Medication List        Accurate as of 08/09/17  4:05 PM. Always use your most recent med list.          aspirin 81 MG tablet Take 81 mg by mouth daily.   atorvastatin 20 MG tablet Commonly known as:  LIPITOR Take 20 mg by mouth daily.   cetirizine 5 MG tablet Commonly known as:  ZYRTEC Take 5 mg by mouth daily.   chlorhexidine 0.12 % solution Commonly known as:  PERIDEX Use as  directed 15 mLs in the mouth or throat 2 (two) times daily.   cyanocobalamin 100 MCG tablet Take 100 mcg by mouth daily.   dicyclomine 10 MG capsule Commonly known as:  BENTYL   diphenhydrAMINE 25 mg capsule Commonly known as:  BENADRYL Take 25 mg by mouth every 6 (six) hours as needed for itching.   dutasteride 0.5 MG capsule Commonly known as:  AVODART Take 1 capsule (0.5 mg total) by mouth daily.   hydrocortisone cream 1 % Apply 1 application topically as needed for itching.   latanoprost 0.005 % ophthalmic solution Commonly known as:  XALATAN Place 1 drop into both eyes at bedtime.   nitroGLYCERIN 0.4 MG SL tablet Commonly known as:  NITROSTAT Place 0.4 mg under the tongue every 5 (five) minutes as needed for chest pain.   omeprazole 20 MG capsule Commonly known as:  PRILOSEC Take 20 mg by mouth daily.   ranitidine 75 MG tablet Commonly known as:  ZANTAC Take 75 mg by mouth at bedtime.   timolol 0.25 % ophthalmic solution Commonly known as:  BETIMOL Place 1 drop into both eyes daily.   triamcinolone cream 0.1 % Commonly known as:  KENALOG Apply 1 application topically 2 (two) times daily as needed (Foot).       Allergies:  Allergies  Allergen Reactions  . Metoprolol   . Oxycodone Other (See Comments)    Reaction:  Hypotension   . Sulfadiazine   . Sulfa Antibiotics Rash    Family History: Family History  Problem Relation Age of Onset  . Parkinsonism Mother   . Kidney disease Father   . Cancer Father        prostate  . Heart disease Father   . Cancer Paternal Uncle        esophageal  . Cancer Paternal Uncle     Social History:  reports that  has never smoked. he has never used smokeless tobacco. He reports that he does not drink alcohol or use drugs.  ROS: UROLOGY Frequent Urination?: No Hard to postpone urination?: Yes Burning/pain with urination?: No Get up at night to urinate?: Yes Leakage of urine?: No Urine stream starts and stops?:  No Trouble starting stream?: No Do you have to strain to urinate?: No Blood in urine?: Yes Urinary tract infection?: No Sexually transmitted disease?: No Injury to kidneys or bladder?: No Painful intercourse?: No Weak stream?: No Erection problems?: No Penile pain?: No  Gastrointestinal Nausea?: No Vomiting?: No Indigestion/heartburn?: No Diarrhea?: No Constipation?: No  Constitutional Fever: No Night sweats?: No Weight loss?: No Fatigue?: No  Skin Skin rash/lesions?: No Itching?: Yes  Eyes Blurred vision?: No Double vision?: No  Ears/Nose/Throat Sore throat?: No Sinus problems?: No  Hematologic/Lymphatic Swollen glands?:  No Easy bruising?: Yes  Cardiovascular Leg swelling?: No Chest pain?: No  Respiratory Cough?: No Shortness of breath?: No  Endocrine Excessive thirst?: No  Musculoskeletal Back pain?: No Joint pain?: No  Neurological Headaches?: No Dizziness?: No  Psychologic Depression?: No Anxiety?: No  Physical Exam: BP (!) 148/74   Pulse 72   Ht 6\' 3"  (1.905 m)   Wt 230 lb (104.3 kg)   BMI 28.75 kg/m   Constitutional:  Alert and oriented, No acute distress. HEENT: [place] AT, moist mucus membranes.  Trachea midline, no masses. Cardiovascular: No clubbing, cyanosis, or edema. Respiratory: Normal respiratory effort, no increased work of breathing. GI: Abdomen is soft, nontender, nondistended, no abdominal masses GU: No CVA tenderness. Skin: No rashes, bruises or suspicious lesions. Lymph: No cervical or inguinal adenopathy. Neurologic: Grossly intact, no focal deficits, moving all 4 extremities. Psychiatric: Normal mood and affect.  Laboratory Data: Lab Results  Component Value Date   WBC 10.8 (H) 11/08/2015   HGB 16.2 11/08/2015   HCT 47.3 11/08/2015   MCV 96.4 11/08/2015   PLT 199 11/08/2015    Lab Results  Component Value Date   CREATININE 1.48 (H) 11/08/2015    Lab Results  Component Value Date   PSA1 4.6 (H)  05/18/2017     Lab Results  Component Value Date   HGBA1C 5.9 (H) 01/15/2015     Assessment & Plan:   1. Gross hematuria Urinalysis today was clear.  He has a history of BPH and intermittent hematuria most likely prostatic in etiology.  Have recommended scheduling cystoscopy and a screening renal ultrasound.  If no significant findings on cystoscopy will get a CT urogram.  He was in agreement with this plan.  Although I received word that his urinalysis was clear when the results were received and epic he did have microhematuria, pyuria and his urine was nitrite positive.  Since he is asymptomatic will repeat the prior to cystoscopy.  - Urinalysis, Complete - Ultrasound renal complete; Future    Abbie Sons, [place] [address] [place], [place] [address] [phone]

## 2017-08-12 ENCOUNTER — Encounter: Payer: Self-pay | Admitting: Urology

## 2017-08-14 ENCOUNTER — Ambulatory Visit
Admission: RE | Admit: 2017-08-14 | Discharge: 2017-08-14 | Disposition: A | Discharge status: Home / Self Care | Payer: Medicare Other | Source: Ambulatory Visit | Attending: Urology | Admitting: Urology

## 2017-08-14 DIAGNOSIS — N400 Enlarged prostate without lower urinary tract symptoms: Secondary | ICD-10-CM | POA: Diagnosis not present

## 2017-08-14 DIAGNOSIS — N200 Calculus of kidney: Secondary | ICD-10-CM | POA: Diagnosis not present

## 2017-08-14 DIAGNOSIS — N281 Cyst of kidney, acquired: Secondary | ICD-10-CM | POA: Diagnosis not present

## 2017-08-14 DIAGNOSIS — R310 Gross hematuria: Secondary | ICD-10-CM

## 2017-08-15 ENCOUNTER — Ambulatory Visit: Payer: Medicare Other

## 2017-08-22 ENCOUNTER — Ambulatory Visit (INDEPENDENT_AMBULATORY_CARE_PROVIDER_SITE_OTHER): Payer: Medicare Other | Admitting: Urology

## 2017-08-22 ENCOUNTER — Encounter: Payer: Self-pay | Admitting: Urology

## 2017-08-22 VITALS — BP 138/70 | HR 69 | Ht 75.0 in | Wt 231.1 lb

## 2017-08-22 DIAGNOSIS — R310 Gross hematuria: Secondary | ICD-10-CM | POA: Diagnosis not present

## 2017-08-22 LAB — URINALYSIS, COMPLETE
Bilirubin, UA: NEGATIVE
Glucose, UA: NEGATIVE
Ketones, UA: NEGATIVE
LEUKOCYTES UA: NEGATIVE
NITRITE UA: NEGATIVE
PH UA: 5.5 (ref 5.0–7.5)
PROTEIN UA: NEGATIVE
RBC, UA: NEGATIVE
Specific Gravity, UA: 1.02 (ref 1.005–1.030)
Urobilinogen, Ur: 0.2 mg/dL (ref 0.2–1.0)

## 2017-08-22 MED ORDER — CIPROFLOXACIN HCL 500 MG PO TABS
500.0000 mg | ORAL_TABLET | Freq: Once | ORAL | Status: AC
  Administered 2017-08-22: 500 mg via ORAL

## 2017-08-22 MED ORDER — LIDOCAINE HCL 2 % EX GEL
1.0000 "application " | Freq: Once | CUTANEOUS | Status: AC
  Administered 2017-08-22: 1 via URETHRAL

## 2017-08-22 NOTE — Progress Notes (Signed)
   08/22/17  CC:  Chief Complaint  Patient presents with  . Cysto    HPI: Recent episode of total gross painless hematuria most likely prostatic in etiology.  No recurrent bleeding.  Blood pressure 138/70, pulse 69, height 6\' 3"  (1.905 m), weight 231 lb 1.6 oz (104.8 kg). NED. A&Ox3.     Cystoscopy Procedure Note  Patient identification was confirmed, informed consent was obtained, and patient was prepped using Betadine solution.  Lidocaine jelly was administered per urethral meatus.    Preoperative abx where received prior to procedure.     Pre-Procedure: - Inspection reveals a normal caliber ureteral meatus.  Procedure: The flexible cystoscope was introduced without difficulty - No urethral strictures/lesions are present. - Adenoma regrowth distally, proximal one half of gland open.prominent hypervascularity. - Normal bladder neck - Bilateral ureteral orifices identified - Bladder mucosa  reveals no ulcers, tumors, or lesions - No bladder stones - No trabeculation  Retroflexion shows no significant abnormalities   Post-Procedure: - Patient tolerated the procedure well  Renal ultrasound showed bilateral simple cysts.  Nonobstructing 9 mm left renal calculus.  The PVR was recorded as 224 cc.  On cystoscopy today the bladder appeared empty upon introduction of the cystoscope.  Assessment/ Plan: No significant abnormalities on cystoscopy.  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  Hematuria most likely prostatic in etiology.  His voiding pattern is stable.  Follow-up 6 months with and symptom reassessment and bladder scan

## 2017-12-05 DIAGNOSIS — M5137 Other intervertebral disc degeneration, lumbosacral region: Secondary | ICD-10-CM | POA: Insufficient documentation

## 2017-12-05 DIAGNOSIS — M51379 Other intervertebral disc degeneration, lumbosacral region without mention of lumbar back pain or lower extremity pain: Secondary | ICD-10-CM

## 2017-12-05 HISTORY — DX: Other intervertebral disc degeneration, lumbosacral region: M51.37

## 2017-12-05 HISTORY — DX: Other intervertebral disc degeneration, lumbosacral region without mention of lumbar back pain or lower extremity pain: M51.379

## 2017-12-19 ENCOUNTER — Ambulatory Visit (INDEPENDENT_AMBULATORY_CARE_PROVIDER_SITE_OTHER): Payer: Medicare Other | Admitting: Urology

## 2017-12-19 ENCOUNTER — Other Ambulatory Visit: Payer: Self-pay

## 2017-12-19 VITALS — BP 96/62 | HR 77 | Ht 74.0 in | Wt 226.0 lb

## 2017-12-19 DIAGNOSIS — R350 Frequency of micturition: Secondary | ICD-10-CM

## 2017-12-19 DIAGNOSIS — H409 Unspecified glaucoma: Secondary | ICD-10-CM | POA: Insufficient documentation

## 2017-12-19 LAB — URINALYSIS, COMPLETE
BILIRUBIN UA: NEGATIVE
Glucose, UA: NEGATIVE
Ketones, UA: NEGATIVE
Leukocytes, UA: NEGATIVE
NITRITE UA: NEGATIVE
PROTEIN UA: NEGATIVE
SPEC GRAV UA: 1.015 (ref 1.005–1.030)
Urobilinogen, Ur: 0.2 mg/dL (ref 0.2–1.0)
pH, UA: 6 (ref 5.0–7.5)

## 2017-12-19 LAB — BLADDER SCAN AMB NON-IMAGING

## 2017-12-19 NOTE — Progress Notes (Signed)
12/19/2017 12:11 PM   Bradley [name] 07/06/1941 [id number]  Referring provider: Sofie Hartigan, [place], [place] [address]  Chief Complaint  Patient presents with  . Urinary Frequency    HPI: 77 year old male with a long history of lower urinary tract symptoms.  He has had a prior PVP and did well for several years.  He recently has noted increased urinary frequency and urgency.  I saw him in December 2018 and he was started back on tamsulosin without improvement in his symptoms.  He remains on dutasteride.  He was also given a trial of Myrbetriq and did not see any significant benefit.  He had a cystoscopy performed in January for hematuria which did show some adenoma regrowth.  He recently had noted worsening frequency, urgency with occasional episodes of urge incontinence.  Denies dysuria or gross hematuria.  He typically has nocturia x2 however got up last night 6 times.  He notes occasional urinary hesitancy and decreased force and caliber of his urinary stream.   PMH: Past Medical History:  Diagnosis Date  . Allergy   . Barrett's esophagus   . Blockage of coronary artery of heart ([place])   . BPH (benign prostatic hyperplasia)   . Cataracts, bilateral   . Chronic diastolic CHF (congestive heart failure), NYHA class 2 ([place]) 12/16/2014  . Colon polyp   . Coronary artery disease   . DDD (degenerative disc disease), lumbosacral 12/05/2017  . Essential hypertension 06/05/2014  . GERD (gastroesophageal reflux disease)    Barretts esophagus  . GI bleed    a. ~2012 around time of hiatal hernia surgery. 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  . Glaucoma   . Hemorrhoids   . History of chicken pox   . History of hiatal hernia 2012  . Hyperlipidemia   . Meningioma ([place])    stable, followed by yearly MRIs  . Migraines   . RBBB   . Stomach ulcer     Surgical History: Past Surgical History:  Procedure Laterality Date  . ATHERECTOMY  1992    . CARDIAC CATHETERIZATION N/A 01/07/2015   Procedure: Left Heart Cath;  Surgeon: Corey Skains, MD;  Location: [facility];  Service: Cardiovascular;  Laterality: N/A;  . CARDIAC SURGERY     coronary angioplasty  . CHOLECYSTECTOMY    . COLONOSCOPY  11/03/08, 03/09/14  . CORONARY ANGIOPLASTY    . CORONARY ARTERY BYPASS GRAFT N/A 01/19/2015   Procedure: CORONARY ARTERY BYPASS GRAFTING (CABG), with LIM to LAD, vein to left intermediate, vein to PD, vein to OM, using right greater saphenous vein via endovein harvest.;  Surgeon: Grace Isaac, MD;  Location: [facility];  Service: Open Heart Surgery;  Laterality: N/A;  . DIAGNOSTIC LAPAROSCOPY     cholecystectomy and hiatal hernia repair  . ENDOVEIN HARVEST OF GREATER SAPHENOUS VEIN Right 01/19/2015   Procedure: ENDOVEIN HARVEST OF GREATER SAPHENOUS VEIN;  Surgeon: Grace Isaac, MD;  Location: [facility];  Service: Open Heart Surgery;  Laterality: Right;  . ESOPHAGOGASTRODUODENOSCOPY  02/19/13  . ESOPHAGOGASTRODUODENOSCOPY (EGD) WITH PROPOFOL N/A 08/09/2015   Procedure: ESOPHAGOGASTRODUODENOSCOPY (EGD) WITH PROPOFOL;  Surgeon: Hulen Luster, MD;  [facility]: [facility];  Service: Gastroenterology;  Laterality: N/A;  . ESOPHAGOGASTRODUODENOSCOPY (EGD) WITH PROPOFOL N/A 04/13/2017   Procedure: ESOPHAGOGASTRODUODENOSCOPY (EGD) WITH PROPOFOL;  Surgeon: Toledo, Benay Pike, MD;  Location: [facility] ENDOSCOPY;  Service: Endoscopy;  Laterality: N/A;  . ESOPHAGUS SURGERY    . EYE SURGERY  cataracts BIL  . HERNIA REPAIR    . HIATAL HERNIA REPAIR N/A   . POLYPECTOMY    . TEE WITHOUT CARDIOVERSION N/A 01/19/2015   Procedure: TRANSESOPHAGEAL ECHOCARDIOGRAM (TEE);  Surgeon: Grace Isaac, MD;  Location: [facility];  Service: Open Heart Surgery;  Laterality: N/A;    Home Medications:  Allergies as of 12/19/2017      Reactions   Metoprolol    Oxycodone Other (See Comments)   Reaction:  Hypotension    Sulfadiazine    Sulfa Antibiotics Rash      Medication  List        Accurate as of 12/19/17 12:11 PM. Always use your most recent med list.          aspirin 81 MG tablet Take 81 mg by mouth daily.   atorvastatin 20 MG tablet Commonly known as:  LIPITOR Take 20 mg by mouth daily.   cetirizine 5 MG tablet Commonly known as:  ZYRTEC Take 5 mg by mouth daily.   chlorhexidine 0.12 % solution Commonly known as:  PERIDEX Use as directed 15 mLs in the mouth or throat 2 (two) times daily.   cyanocobalamin 100 MCG tablet Take 100 mcg by mouth daily.   dicyclomine 10 MG capsule Commonly known as:  BENTYL   diphenhydrAMINE 25 mg capsule Commonly known as:  BENADRYL Take 25 mg by mouth every 6 (six) hours as needed for itching.   dutasteride 0.5 MG capsule Commonly known as:  AVODART Take 1 capsule (0.5 mg total) by mouth daily.   fluticasone 50 MCG/ACT nasal spray Commonly known as:  FLONASE   hydrocortisone cream 1 % Apply 1 application topically as needed for itching.   latanoprost 0.005 % ophthalmic solution Commonly known as:  XALATAN Place 1 drop into both eyes at bedtime.   nitroGLYCERIN 0.4 MG SL tablet Commonly known as:  NITROSTAT Place 0.4 mg under the tongue every 5 (five) minutes as needed for chest pain.   omeprazole 20 MG capsule Commonly known as:  PRILOSEC Take 20 mg by mouth daily.   ranitidine 75 MG tablet Commonly known as:  ZANTAC Take 75 mg by mouth at bedtime.   timolol 0.25 % ophthalmic solution Commonly known as:  BETIMOL Place 1 drop into both eyes daily.   triamcinolone cream 0.1 % Commonly known as:  KENALOG Apply 1 application topically 2 (two) times daily as needed ([place]).       Allergies:  Allergies  Allergen Reactions  . Metoprolol   . Oxycodone Other (See Comments)    Reaction:  Hypotension   . Sulfadiazine   . Sulfa Antibiotics Rash    Family History: Family History  Problem Relation Age of Onset  . Parkinsonism Mother   . Kidney disease Father   . Cancer Father         prostate  . Heart disease Father   . Cancer Paternal Uncle        esophageal  . Cancer Paternal Uncle     Social History:  reports that he has never smoked. He has never used smokeless tobacco. He reports that he does not drink alcohol or use drugs.  ROS: UROLOGY Frequent Urination?: Yes Hard to postpone urination?: Yes Burning/pain with urination?: No Get up at night to urinate?: Yes Leakage of urine?: Yes Urine stream starts and stops?: Yes Trouble starting stream?: Yes Do you have to strain to urinate?: No Blood in urine?: No Urinary tract infection?: No Sexually transmitted disease?: No Injury  to kidneys or bladder?: No Painful intercourse?: No Weak stream?: Yes Erection problems?: No Penile pain?: No  Gastrointestinal Nausea?: No Vomiting?: No Indigestion/heartburn?: No Diarrhea?: No Constipation?: No  Constitutional Fever: No Night sweats?: No Weight loss?: No Fatigue?: No  Skin Skin rash/lesions?: No Itching?: No  Eyes Blurred vision?: No Double vision?: No  Ears/Nose/Throat Sore throat?: No Sinus problems?: No  Hematologic/Lymphatic Swollen glands?: No Easy bruising?: No  Cardiovascular Leg swelling?: No Chest pain?: No  Respiratory Cough?: No Shortness of breath?: No  Endocrine Excessive thirst?: No  Musculoskeletal Back pain?: No Joint pain?: No  Neurological Headaches?: No Dizziness?: No  Psychologic Depression?: No Anxiety?: No  Physical Exam: BP 96/62 (BP Location: Left Arm, Patient Position: Sitting, Cuff Size: Large)   Pulse 77   Ht 6\' 2"  (1.88 m)   Wt 226 lb (102.5 kg)   BMI 29.02 kg/m   Constitutional:  Alert and oriented, No acute distress. HEENT: [place] AT, moist mucus membranes.  Trachea midline, no masses. Cardiovascular: No clubbing, cyanosis, or edema. Respiratory: Normal respiratory effort, no increased work of breathing. GI: Abdomen is soft, nontender, nondistended, no abdominal masses GU: No CVA  tenderness Lymph: No cervical or inguinal lymphadenopathy. Skin: No rashes, bruises or suspicious lesions. Neurologic: Grossly intact, no focal deficits, moving all 4 extremities. Psychiatric: Normal mood and affect.  Laboratory Data:   Urinalysis Dipstick/microscopy negative  Assessment & Plan:   77 year old male with worsening lower urinary tract symptoms.  Urinalysis was unremarkable.  Will his storage related symptoms are most bothersome and will retry Myrbetriq.  He was given 50 mg samples to try for 1 week and will call back regarding efficacy.  If no improvement would recommend a urodynamic study.    Abbie Sons, [facility] [address] [place], [place] [address] [phone]

## 2017-12-20 ENCOUNTER — Encounter: Payer: Self-pay | Admitting: Urology

## 2018-01-07 ENCOUNTER — Other Ambulatory Visit: Payer: Self-pay

## 2018-01-07 MED ORDER — MIRABEGRON ER 50 MG PO TB24: 50.0000 mg | ORAL_TABLET | Freq: Every day | ORAL | 11 refills | Status: DC

## 2018-01-07 NOTE — Telephone Encounter (Signed)
Patient stopped by office requesting a script sent for Myrbetriq 50mg  to pharm. Samples worked well, script sent to [facility]

## 2018-01-09 ENCOUNTER — Telehealth: Payer: Self-pay | Admitting: Urology

## 2018-01-09 NOTE — Telephone Encounter (Signed)
Patient called and said that he can not afford the myrbetriq you gave him and wants to know if there is anything else he can take.  Please advise  Sharyn Lull

## 2018-01-10 NOTE — Telephone Encounter (Signed)
He could be tried on an anticholinergic medication however would need to check his formulary to see the preferred OAB medication

## 2018-01-14 NOTE — Telephone Encounter (Signed)
Attempted to reach pt, he answered, but there was a bad connection, no answer on call back.

## 2018-01-14 NOTE — Telephone Encounter (Signed)
Pt informed

## 2018-01-23 ENCOUNTER — Ambulatory Visit: Payer: Medicare Other | Admitting: Urology

## 2018-01-28 ENCOUNTER — Telehealth: Payer: Self-pay

## 2018-01-28 NOTE — Telephone Encounter (Signed)
Patient came by office today wanting samples of Myrbetriq to last him until his apt in august to discuss a less expensive drug . His insurance perfers oxybutinin patient would like to discuss this with Dr. Bernardo Heater before starting. 4 boxes of Myrbetriq 50mg  were given to patient

## 2018-02-19 ENCOUNTER — Encounter: Payer: Self-pay | Admitting: Urology

## 2018-02-19 ENCOUNTER — Ambulatory Visit (INDEPENDENT_AMBULATORY_CARE_PROVIDER_SITE_OTHER): Payer: Medicare Other | Admitting: Urology

## 2018-02-19 VITALS — BP 137/79 | HR 76 | Ht 74.0 in | Wt 229.9 lb

## 2018-02-19 DIAGNOSIS — R350 Frequency of micturition: Secondary | ICD-10-CM | POA: Diagnosis not present

## 2018-02-19 DIAGNOSIS — R351 Nocturia: Secondary | ICD-10-CM | POA: Diagnosis not present

## 2018-02-19 DIAGNOSIS — R972 Elevated prostate specific antigen [PSA]: Secondary | ICD-10-CM

## 2018-02-19 DIAGNOSIS — N138 Other obstructive and reflux uropathy: Secondary | ICD-10-CM

## 2018-02-19 DIAGNOSIS — N401 Enlarged prostate with lower urinary tract symptoms: Secondary | ICD-10-CM

## 2018-02-19 LAB — URINALYSIS, COMPLETE
BILIRUBIN UA: NEGATIVE
GLUCOSE, UA: NEGATIVE
Ketones, UA: NEGATIVE
Leukocytes, UA: NEGATIVE
NITRITE UA: NEGATIVE
PROTEIN UA: NEGATIVE
Specific Gravity, UA: 1.02 (ref 1.005–1.030)
UUROB: 0.2 mg/dL (ref 0.2–1.0)
pH, UA: 5.5 (ref 5.0–7.5)

## 2018-02-19 LAB — MICROSCOPIC EXAMINATION
Epithelial Cells (non renal): NONE SEEN /hpf (ref 0–10)
WBC, UA: NONE SEEN /hpf (ref 0–5)

## 2018-02-19 LAB — BLADDER SCAN AMB NON-IMAGING

## 2018-02-19 MED ORDER — OXYBUTYNIN CHLORIDE 5 MG PO TABS: ORAL_TABLET | ORAL | 0 refills | Status: DC

## 2018-02-19 NOTE — Progress Notes (Signed)
02/19/2018 5:48 PM   [name] 05-12-1941 [id number]  Referring provider: Sofie Hartigan, MD [place], [place] [address]  Chief Complaint  Patient presents with  . Follow-up    HPI: 77 year old male presents for follow-up of lower urinary tract symptoms.  I last saw him in early June 2019 and he was complaining of worsening frequency, urgency with occasional episodes of urge incontinence.  He had mild urinary hesitancy and decreased force and caliber of his urinary stream.  He was started on Myrbetriq which he states has improved his symptoms though his co-pay was $190 per month which he is unable to afford.  His pharmacy suggested oxybutynin as an alternative. IPSS completed today was 13/35 with a quality of life rated 3/6.  He indicated nocturia x4 today which he states is his most bothersome symptom.  PMH: Past Medical History:  Diagnosis Date  . Allergy   . Barrett's esophagus   . Blockage of coronary artery of heart ([place])   . BPH (benign prostatic hyperplasia)   . Cataracts, bilateral   . Chronic diastolic CHF (congestive heart failure), NYHA class 2 ([place]) 12/16/2014  . Colon polyp   . Coronary artery disease   . DDD (degenerative disc disease), lumbosacral 12/05/2017  . Essential hypertension 06/05/2014  . GERD (gastroesophageal reflux disease)    Barretts esophagus  . GI bleed    a. ~2012 around time of hiatal hernia surgery. 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  . Glaucoma   . Hemorrhoids   . History of chicken pox   . History of hiatal hernia 2012  . Hyperlipidemia   . Meningioma ([place])    stable, followed by yearly MRIs  . Migraines   . RBBB   . Stomach ulcer     Surgical History: Past Surgical History:  Procedure Laterality Date  . ATHERECTOMY  1992  . CARDIAC CATHETERIZATION N/A 01/07/2015   Procedure: Left Heart Cath;  Surgeon: Corey Skains, MD;  Location: [facility];  Service: Cardiovascular;   Laterality: N/A;  . CARDIAC SURGERY     coronary angioplasty  . CHOLECYSTECTOMY    . COLONOSCOPY  11/03/08, 03/09/14  . CORONARY ANGIOPLASTY    . CORONARY ARTERY BYPASS GRAFT N/A 01/19/2015   Procedure: CORONARY ARTERY BYPASS GRAFTING (CABG), with LIM to LAD, vein to left intermediate, vein to PD, vein to OM, using right greater saphenous vein via endovein harvest.;  Surgeon: Grace Isaac, MD;  Location: [facility];  Service: Open Heart Surgery;  Laterality: N/A;  . DIAGNOSTIC LAPAROSCOPY     cholecystectomy and hiatal hernia repair  . ENDOVEIN HARVEST OF GREATER SAPHENOUS VEIN Right 01/19/2015   Procedure: ENDOVEIN HARVEST OF GREATER SAPHENOUS VEIN;  Surgeon: Grace Isaac, MD;  Location: [facility];  Service: Open Heart Surgery;  Laterality: Right;  . ESOPHAGOGASTRODUODENOSCOPY  02/19/13  . ESOPHAGOGASTRODUODENOSCOPY (EGD) WITH PROPOFOL N/A 08/09/2015   Procedure: ESOPHAGOGASTRODUODENOSCOPY (EGD) WITH PROPOFOL;  Surgeon: Hulen Luster, MD;  Location: [facility] ENDOSCOPY;  Service: Gastroenterology;  Laterality: N/A;  . ESOPHAGOGASTRODUODENOSCOPY (EGD) WITH PROPOFOL N/A 04/13/2017   Procedure: ESOPHAGOGASTRODUODENOSCOPY (EGD) WITH PROPOFOL;  Surgeon: Toledo, Benay Pike, MD;  Location: [facility] ENDOSCOPY;  Service: Endoscopy;  Laterality: N/A;  . ESOPHAGUS SURGERY    . EYE SURGERY     cataracts BIL  . HERNIA REPAIR    . HIATAL HERNIA REPAIR N/A   . POLYPECTOMY    . TEE WITHOUT CARDIOVERSION N/A 01/19/2015   Procedure:  TRANSESOPHAGEAL ECHOCARDIOGRAM (TEE);  Surgeon: Grace Isaac, MD;  Location: [facility];  Service: Open Heart Surgery;  Laterality: N/A;    Home Medications:  Allergies as of 02/19/2018      Reactions   Metoprolol    Oxycodone Other (See Comments)   Reaction:  Hypotension    Sulfadiazine    Sulfa Antibiotics Rash      Medication List        Accurate as of 02/19/18  5:48 PM. Always use your most recent med list.          aspirin 81 MG tablet Take 81 mg by mouth daily.   atorvastatin 20  MG tablet Commonly known as:  LIPITOR Take 20 mg by mouth daily.   cetirizine 5 MG tablet Commonly known as:  ZYRTEC Take 5 mg by mouth daily.   chlorhexidine 0.12 % solution Commonly known as:  PERIDEX Use as directed 15 mLs in the mouth or throat 2 (two) times daily.   cyanocobalamin 100 MCG tablet Take 100 mcg by mouth daily.   dicyclomine 10 MG capsule Commonly known as:  BENTYL   diphenhydrAMINE 25 mg capsule Commonly known as:  BENADRYL Take 25 mg by mouth every 6 (six) hours as needed for itching.   dutasteride 0.5 MG capsule Commonly known as:  AVODART Take 1 capsule (0.5 mg total) by mouth daily.   fluticasone 50 MCG/ACT nasal spray Commonly known as:  FLONASE   hydrocortisone cream 1 % Apply 1 application topically as needed for itching.   latanoprost 0.005 % ophthalmic solution Commonly known as:  XALATAN Place 1 drop into both eyes at bedtime.   mirabegron ER 50 MG Tb24 tablet Commonly known as:  MYRBETRIQ Take 1 tablet (50 mg total) by mouth daily.   nitroGLYCERIN 0.4 MG SL tablet Commonly known as:  NITROSTAT Place 0.4 mg under the tongue every 5 (five) minutes as needed for chest pain.   omeprazole 20 MG capsule Commonly known as:  PRILOSEC Take 20 mg by mouth daily.   ranitidine 75 MG tablet Commonly known as:  ZANTAC Take 75 mg by mouth at bedtime.   timolol 0.25 % ophthalmic solution Commonly known as:  BETIMOL Place 1 drop into both eyes daily.   triamcinolone cream 0.1 % Commonly known as:  KENALOG Apply 1 application topically 2 (two) times daily as needed ([place]).       Allergies:  Allergies  Allergen Reactions  . Metoprolol   . Oxycodone Other (See Comments)    Reaction:  Hypotension   . Sulfadiazine   . Sulfa Antibiotics Rash    Family History: Family History  Problem Relation Age of Onset  . Parkinsonism Mother   . Kidney disease Father   . Cancer Father        prostate  . Heart disease Father   . Cancer Paternal  Uncle        esophageal  . Cancer Paternal Uncle     Social History:  reports that he has never smoked. He has never used smokeless tobacco. He reports that he does not drink alcohol or use drugs.  ROS: UROLOGY Frequent Urination?: Yes Hard to postpone urination?: Yes Burning/pain with urination?: No Get up at night to urinate?: Yes Leakage of urine?: No Urine stream starts and stops?: No Trouble starting stream?: No Do you have to strain to urinate?: No Blood in urine?: No Urinary tract infection?: No Sexually transmitted disease?: No Injury to kidneys or bladder?: No Painful intercourse?:  No Weak stream?: No Erection problems?: No Penile pain?: No  Gastrointestinal Nausea?: No Vomiting?: No Indigestion/heartburn?: No Diarrhea?: No Constipation?: No  Constitutional Fever: No Night sweats?: No Weight loss?: No Fatigue?: No  Skin Skin rash/lesions?: No Itching?: No  Eyes Blurred vision?: No Double vision?: No  Ears/Nose/Throat Sore throat?: No Sinus problems?: No  Hematologic/Lymphatic Swollen glands?: No Easy bruising?: Yes  Cardiovascular Leg swelling?: No Chest pain?: No  Respiratory Cough?: No Shortness of breath?: No  Endocrine Excessive thirst?: No  Musculoskeletal Back pain?: No Joint pain?: No  Neurological Headaches?: No Dizziness?: No  Psychologic Depression?: No Anxiety?: No  Physical Exam: BP 137/79 (BP Location: Left Arm, Patient Position: Sitting, Cuff Size: Normal)   Pulse 76   Ht 6\' 2"  (1.88 m)   Wt 229 lb 14.4 oz (104.3 kg)   BMI 29.52 kg/m   Constitutional:  Alert and oriented, No acute distress. HEENT: [place] AT, moist mucus membranes.  Trachea midline, no masses. Cardiovascular: No clubbing, cyanosis, or edema. Respiratory: Normal respiratory effort, no increased work of breathing. GI: Abdomen is soft, nontender, nondistended, no abdominal masses GU: No CVA tenderness Lymph: No cervical or inguinal  lymphadenopathy. Skin: No rashes, bruises or suspicious lesions. Neurologic: Grossly intact, no focal deficits, moving all 4 extremities. Psychiatric: Normal mood and affect.  Laboratory Data:   Urinalysis Dipstick trace blood/microscopy negative   Assessment & Plan:   77 year old male with lower urinary tract symptoms.  He did see improvement on Myrbetriq however this medication is cost prohibitive.  At present his most bothersome symptom is nocturia.  Unlikely his insurance would cover [place].  I discussed a trial of immediate release oxybutynin which he could take at bedtime and as needed for daytime symptoms.  I discussed the most common side effects of dry mouth and constipation with a small possibility of urinary retention/incomplete bladder emptying.  He want to try and an Rx was sent to his pharmacy.  PVR by bladder scan today was 35 mL.  He has a history of an elevated PSA and will be due for a PSA in November 2019.  He also states his stream has decreased since his PVP several years ago and I discussed cystoscopy however he states this is not bothersome enough that he desires to pursue cystoscopy at this time.  Return in about 6 months (around 08/22/2018) for Recheck, PSA.   Abbie Sons, [facility] [address] [place], [place] [address] [phone]

## 2018-02-20 LAB — PSA: PROSTATE SPECIFIC AG, SERUM: 3.8 ng/mL (ref 0.0–4.0)

## 2018-02-25 ENCOUNTER — Encounter: Payer: Self-pay | Admitting: Urology

## 2018-02-25 ENCOUNTER — Telehealth: Payer: Self-pay

## 2018-02-25 NOTE — Telephone Encounter (Signed)
-----   Message from Abbie Sons, MD sent at 02/24/2018 10:24 AM EDT ----- PSA was normal at 3.8

## 2018-02-25 NOTE — Telephone Encounter (Signed)
Patient notified

## 2018-03-07 DIAGNOSIS — M5030 Other cervical disc degeneration, unspecified cervical region: Secondary | ICD-10-CM | POA: Insufficient documentation

## 2018-03-08 ENCOUNTER — Other Ambulatory Visit
Admission: RE | Admit: 2018-03-08 | Discharge: 2018-03-08 | Disposition: A | Discharge status: Home / Self Care | Payer: Medicare Other | Source: Ambulatory Visit | Attending: Gastroenterology | Admitting: Gastroenterology

## 2018-03-08 DIAGNOSIS — R159 Full incontinence of feces: Secondary | ICD-10-CM | POA: Insufficient documentation

## 2018-03-08 DIAGNOSIS — R197 Diarrhea, unspecified: Secondary | ICD-10-CM | POA: Insufficient documentation

## 2018-03-08 LAB — GASTROINTESTINAL PANEL BY PCR, STOOL (REPLACES STOOL CULTURE)
ASTROVIRUS: NOT DETECTED
Adenovirus F40/41: NOT DETECTED
CAMPYLOBACTER SPECIES: NOT DETECTED
CYCLOSPORA CAYETANENSIS: NOT DETECTED
Cryptosporidium: NOT DETECTED
ENTEROTOXIGENIC E COLI (ETEC): NOT DETECTED
Entamoeba histolytica: NOT DETECTED
Enteroaggregative E coli (EAEC): NOT DETECTED
Enteropathogenic E coli (EPEC): NOT DETECTED
Giardia lamblia: NOT DETECTED
NOROVIRUS GI/GII: NOT DETECTED
PLESIMONAS SHIGELLOIDES: NOT DETECTED
Rotavirus A: NOT DETECTED
SALMONELLA SPECIES: NOT DETECTED
SAPOVIRUS (I, II, IV, AND V): NOT DETECTED
SHIGA LIKE TOXIN PRODUCING E COLI (STEC): NOT DETECTED
SHIGELLA/ENTEROINVASIVE E COLI (EIEC): NOT DETECTED
Vibrio cholerae: NOT DETECTED
Vibrio species: NOT DETECTED
Yersinia enterocolitica: NOT DETECTED

## 2018-03-08 LAB — C DIFFICILE QUICK SCREEN W PCR REFLEX
C DIFFICILE (CDIFF) INTERP: NOT DETECTED
C DIFFICLE (CDIFF) ANTIGEN: NEGATIVE
C Diff toxin: NEGATIVE

## 2018-03-19 ENCOUNTER — Other Ambulatory Visit: Payer: Self-pay | Admitting: Neurology

## 2018-03-19 DIAGNOSIS — R413 Other amnesia: Secondary | ICD-10-CM

## 2018-04-03 ENCOUNTER — Ambulatory Visit
Admission: RE | Admit: 2018-04-03 | Discharge: 2018-04-03 | Disposition: A | Discharge status: Home / Self Care | Payer: Medicare Other | Source: Ambulatory Visit | Attending: Neurology | Admitting: Neurology

## 2018-04-03 DIAGNOSIS — D1802 Hemangioma of intracranial structures: Secondary | ICD-10-CM | POA: Insufficient documentation

## 2018-04-03 DIAGNOSIS — R413 Other amnesia: Secondary | ICD-10-CM | POA: Diagnosis present

## 2018-04-24 ENCOUNTER — Other Ambulatory Visit: Payer: Self-pay

## 2018-04-24 ENCOUNTER — Encounter: Payer: Self-pay | Admitting: Emergency Medicine

## 2018-04-24 ENCOUNTER — Ambulatory Visit
Admission: EM | Admit: 2018-04-24 | Discharge: 2018-04-24 | Disposition: A | Discharge status: Home / Self Care | Payer: Medicare Other | Attending: Family Medicine | Admitting: Family Medicine

## 2018-04-24 DIAGNOSIS — K644 Residual hemorrhoidal skin tags: Secondary | ICD-10-CM

## 2018-04-24 MED ORDER — PREDNISONE 20 MG PO TABS: 20.0000 mg | ORAL_TABLET | Freq: Every day | ORAL | 0 refills | Status: DC

## 2018-04-24 NOTE — ED Provider Notes (Signed)
[facility]    CSN: [id number] Arrival date & time: 04/24/18  1037     History   Chief Complaint Chief Complaint  Patient presents with  . Hemorrhoids    HPI Bradley [name] is a 77 y.o. male.   77 yo male with a h/o hemorrhoids presents with a c/o 2 weeks of inflamed hemorrhoid that started bleeding 2 days ago. Denies any fevers, chills. Has been putting hydrocortisone 2.5% cream.      Past Medical History:  Diagnosis Date  . Allergy   . Barrett's esophagus   . Blockage of coronary artery of heart ([place])   . BPH (benign prostatic hyperplasia)   . Cataracts, bilateral   . Chronic diastolic CHF (congestive heart failure), NYHA class 2 ([place]) 12/16/2014  . Colon polyp   . Coronary artery disease   . DDD (degenerative disc disease), lumbosacral 12/05/2017  . Essential hypertension 06/05/2014  . GERD (gastroesophageal reflux disease)    Barretts esophagus  . GI bleed    a. ~2012 around time of hiatal hernia surgery. 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  . Glaucoma   . Hemorrhoids   . History of chicken pox   . History of hiatal hernia 2012  . Hyperlipidemia   . Meningioma ([place])    stable, followed by yearly MRIs  . Migraines   . RBBB   . Stomach ulcer     Patient Active Problem List   Diagnosis Date Noted  . Glaucoma (increased eye pressure) 12/19/2017  . DDD (degenerative disc disease), lumbosacral 12/05/2017  . Urinary frequency 06/21/2017  . Urinary urgency 06/21/2017  . Bilateral carotid artery stenosis 04/11/2017  . Atrial fibrillation ([place]) 01/30/2015  . 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 Stable angina ([place]) 01/07/2015  . Chronic diastolic CHF (congestive heart failure), NYHA class 2 ([place]) 12/16/2014  . Arteriosclerosis of coronary artery 12/10/2014  . GERD (gastroesophageal reflux disease) 06/16/2014  . Benign essential HTN 06/05/2014  . Mixed hyperlipidemia 06/05/2014  . Moderate mitral insufficiency 06/05/2014  . Chest  pain 01/12/2014  . Angina pectoris ([place]) 10/09/2013  . 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 Esophageal disease 10/09/2013  . Benign prostatic hyperplasia with urinary obstruction 04/01/2012  . 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 ED (erectile dysfunction) of organic origin 04/01/2012  . Elevated prostate specific antigen (PSA) 04/01/2012  . Benign neoplasm of cerebral meninges ([place]) 04/24/2011    Past Surgical History:  Procedure Laterality Date  . ATHERECTOMY  1992  . CARDIAC CATHETERIZATION N/A 01/07/2015   Procedure: Left Heart Cath;  Surgeon: Corey Skains, MD;  Location: [place] [facility];  Service: Cardiovascular;  Laterality: N/A;  . CARDIAC SURGERY     coronary angioplasty  . CHOLECYSTECTOMY    . COLONOSCOPY  11/03/08, 03/09/14  . CORONARY ANGIOPLASTY    . CORONARY ARTERY BYPASS GRAFT N/A 01/19/2015   Procedure: CORONARY ARTERY BYPASS GRAFTING (CABG), with LIM to LAD, vein to left intermediate, vein to PD, vein to OM, using right greater saphenous vein via endovein harvest.;  Surgeon: Grace Isaac, MD;  Location: [place];  Service: Open Heart Surgery;  Laterality: N/A;  . DIAGNOSTIC LAPAROSCOPY     cholecystectomy and hiatal hernia repair  . ENDOVEIN HARVEST OF GREATER SAPHENOUS VEIN Right 01/19/2015   Procedure: ENDOVEIN HARVEST OF GREATER SAPHENOUS VEIN;  Surgeon: Grace Isaac, MD;  Location: [place];  Service: Open Heart Surgery;  Laterality: Right;  . ESOPHAGOGASTRODUODENOSCOPY  02/19/13  .  ESOPHAGOGASTRODUODENOSCOPY (EGD) WITH PROPOFOL N/A 08/09/2015   Procedure: ESOPHAGOGASTRODUODENOSCOPY (EGD) WITH PROPOFOL;  Surgeon: Hulen Luster, MD;  Location: [facility]COPY;  Service: Gastroenterology;  Laterality: N/A;  . ESOPHAGOGASTRODUODENOSCOPY (EGD) WITH PROPOFOL N/A 04/13/2017   Procedure: ESOPHAGOGASTRODUODENOSCOPY (EGD) WITH PROPOFOL;  Surgeon: Toledo, Benay Pike, MD;  Location: [facility] ENDOSCOPY;  Service: Endoscopy;  Laterality: N/A;  . ESOPHAGUS SURGERY    . EYE SURGERY      cataracts BIL  . HERNIA REPAIR    . HIATAL HERNIA REPAIR N/A   . POLYPECTOMY    . TEE WITHOUT CARDIOVERSION N/A 01/19/2015   Procedure: TRANSESOPHAGEAL ECHOCARDIOGRAM (TEE);  Surgeon: Grace Isaac, MD;  Location: [place];  Service: Open Heart Surgery;  Laterality: N/A;       Home Medications    Prior to Admission medications   Medication Sig Start Date End Date Taking? Authorizing Provider  aspirin 81 MG tablet Take 81 mg by mouth daily.   Yes [provider]  atorvastatin (LIPITOR) 20 MG tablet Take 20 mg by mouth daily.   Yes [provider]  cetirizine (ZYRTEC) 5 MG tablet Take 5 mg by mouth daily.   Yes [provider]  cyanocobalamin 100 MCG tablet Take 100 mcg by mouth daily.   Yes [provider]  dutasteride (AVODART) 0.5 MG capsule Take 1 capsule (0.5 mg total) by mouth daily. 05/18/17  Yes Stoioff, Ronda Fairly, MD  fluticasone (FLONASE) 50 MCG/ACT nasal spray  10/17/17  Yes [provider]  latanoprost (XALATAN) 0.005 % ophthalmic solution Place 1 drop into both eyes at bedtime.   Yes [provider]  omeprazole (PRILOSEC) 20 MG capsule Take 20 mg by mouth daily.   Yes [provider]  oxybutynin (DITROPAN) 5 MG tablet 1 tab at bedtime and may take in a.m. as needed for voiding symptoms. 02/19/18  Yes Stoioff, Ronda Fairly, MD  ranitidine (ZANTAC) 75 MG tablet Take 75 mg by mouth at bedtime.   Yes [provider]  timolol (BETIMOL) 0.25 % ophthalmic solution Place 1 drop into both eyes daily.   Yes [provider]  chlorhexidine (PERIDEX) 0.12 % solution Use as directed 15 mLs in the mouth or throat 2 (two) times daily.    [provider]  dicyclomine (BENTYL) 10 MG capsule  08/06/17   [provider]  diphenhydrAMINE (BENADRYL) 25 mg capsule Take 25 mg by mouth every 6 (six) hours as needed for itching.    [provider]  hydrocortisone cream 1 % Apply 1 application topically as  needed for itching.    [provider]  mirabegron ER (MYRBETRIQ) 50 MG TB24 tablet Take 1 tablet (50 mg total) by mouth daily. 01/07/18   Stoioff, Ronda Fairly, MD  nitroGLYCERIN (NITROSTAT) 0.4 MG SL tablet Place 0.4 mg under the tongue every 5 (five) minutes as needed for chest pain.    [provider]  predniSONE (DELTASONE) 20 MG tablet Take 1 tablet (20 mg total) by mouth daily. 04/24/18   Norval Gable, MD  triamcinolone cream (KENALOG) 0.1 % Apply 1 application topically 2 (two) times daily as needed (Foot).    [provider]    Family History Family History  Problem Relation Age of Onset  . Parkinsonism Mother   . Kidney disease Father   . Cancer Father        prostate  . Heart disease Father   . Cancer Paternal Uncle        esophageal  .  Cancer Paternal Uncle     Social History Social History   Tobacco Use  . Smoking status: Never Smoker  . Smokeless tobacco: Never Used  Substance Use Topics  . Alcohol use: No    Alcohol/week: 0.0 standard drinks  . Drug use: No     Allergies   Metoprolol; Oxycodone; Sulfadiazine; and Sulfa antibiotics   Review of Systems Review of Systems   Physical Exam Triage Vital Signs ED Triage Vitals  Enc Vitals Group     BP 04/24/18 1052 (!) 142/88     Pulse Rate 04/24/18 1052 (!) 56     Resp 04/24/18 1052 16     Temp 04/24/18 1052 97.8 F (36.6 C)     Temp Source 04/24/18 1052 Oral     SpO2 04/24/18 1052 99 %     Weight 04/24/18 1047 219 lb (99.3 kg)     Height 04/24/18 1047 6\' 3"  (1.905 m)     Head Circumference --      Peak Flow --      Pain Score 04/24/18 1047 0     Pain Loc --      Pain Edu? --      Excl. in [place]? --    No data found.  Updated Vital Signs BP (!) 142/88 (BP Location: Left Arm)   Pulse (!) 56   Temp 97.8 F (36.6 C) (Oral)   Resp 16   Ht 6\' 3"  (1.905 m)   Wt 99.3 kg   SpO2 99%   BMI 27.37 kg/m   Visual Acuity Right Eye Distance:   Left Eye Distance:   Bilateral  Distance:    Right Eye Near:   Left Eye Near:    Bilateral Near:     Physical Exam  Constitutional: He appears well-developed and well-nourished. No distress.  Genitourinary: Rectal exam shows external hemorrhoid (inflamed; tender; no active bleeding).  Skin: He is not diaphoretic.  Nursing note and vitals reviewed.    UC Treatments / Results  Labs (all labs ordered are listed, but only abnormal results are displayed) Labs Reviewed - No data to display  EKG None  Radiology No results found.  Procedures Procedures (including critical care time)  Medications Ordered in UC Medications - No data to display  Initial Impression / Assessment and Plan / UC Course  I have reviewed the triage vital signs and the nursing notes.  Pertinent labs & imaging results that were available during my care of the patient were reviewed by me and considered in my medical decision making (see chart for details).      Final Clinical Impressions(s) / UC Diagnoses   Final diagnoses:  External hemorrhoids    ED Prescriptions    Medication Sig Dispense Auth. Provider   predniSONE (DELTASONE) 20 MG tablet Take 1 tablet (20 mg total) by mouth daily. 7 tablet Norval Gable, MD     1. diagnosis reviewed with patient 2. rx as per orders above; reviewed possible side effects, interactions, risks and benefits  3. Recommend supportive treatment with sitz baths; continue cortisone cream 4. Follow-up prn if symptoms worsen or don't improve   Controlled Substance Prescriptions [place] Controlled Substance Registry consulted? Not Applicable   Norval Gable, MD 04/24/18 [phone]

## 2018-04-24 NOTE — ED Triage Notes (Signed)
Patient states that he has hemrroids that have started bleeding 2 days ago.  Patient also reports that he has had some loose stools for the past 2 days.

## 2018-04-25 DIAGNOSIS — R159 Full incontinence of feces: Secondary | ICD-10-CM | POA: Insufficient documentation

## 2018-04-25 DIAGNOSIS — K644 Residual hemorrhoidal skin tags: Secondary | ICD-10-CM | POA: Insufficient documentation

## 2018-05-16 ENCOUNTER — Other Ambulatory Visit: Payer: Self-pay | Admitting: Neurology

## 2018-05-16 DIAGNOSIS — R1312 Dysphagia, oropharyngeal phase: Secondary | ICD-10-CM

## 2018-05-29 ENCOUNTER — Ambulatory Visit
Admission: RE | Admit: 2018-05-29 | Discharge: 2018-05-29 | Disposition: A | Discharge status: Home / Self Care | Payer: Medicare Other | Source: Ambulatory Visit | Attending: Neurology | Admitting: Neurology

## 2018-05-29 DIAGNOSIS — R1312 Dysphagia, oropharyngeal phase: Secondary | ICD-10-CM | POA: Insufficient documentation

## 2018-05-29 DIAGNOSIS — R1310 Dysphagia, unspecified: Secondary | ICD-10-CM | POA: Diagnosis present

## 2018-05-29 NOTE — Therapy (Signed)
[facility], [place], [address] Phone: [phone]   Fax:     Modified Barium Swallow  Patient Details  Name: Bradley [name] MRN: [id number] Date of Birth: August 21, 1940 No data recorded  Encounter Date: 05/29/2018  End of Session - 05/29/18 1336    Visit Number  1    Number of Visits  1    Date for SLP Re-Evaluation  05/29/18    SLP Start Time  1246    SLP Stop Time   1336    SLP Time Calculation (min)  50 min    Activity Tolerance  Patient tolerated treatment well       Past Medical History:  Diagnosis Date  . Allergy   . Barrett's esophagus   . Blockage of coronary artery of heart ([place])   . BPH (benign prostatic hyperplasia)   . Cataracts, bilateral   . Chronic diastolic CHF (congestive heart failure), NYHA class 2 ([place]) 12/16/2014  . Colon polyp   . Coronary artery disease   . DDD (degenerative disc disease), lumbosacral 12/05/2017  . Essential hypertension 06/05/2014  . GERD (gastroesophageal reflux disease)    Barretts esophagus  . GI bleed    a. ~2012 around time of hiatal hernia surgery. 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  . Glaucoma   . Hemorrhoids   . History of chicken pox   . History of hiatal hernia 2012  . Hyperlipidemia   . Meningioma ([place])    stable, followed by yearly MRIs  . Migraines   . RBBB   . Stomach ulcer     Past Surgical History:  Procedure Laterality Date  . ATHERECTOMY  1992  . CARDIAC CATHETERIZATION N/A 01/07/2015   Procedure: Left Heart Cath;  Surgeon: Corey Skains, MD;  Location: [employer] [facility];  Service: Cardiovascular;  Laterality: N/A;  . CARDIAC SURGERY     coronary angioplasty  . CHOLECYSTECTOMY    . COLONOSCOPY  11/03/08, 03/09/14  . CORONARY ANGIOPLASTY    . CORONARY ARTERY BYPASS GRAFT N/A 01/19/2015   Procedure: CORONARY ARTERY BYPASS GRAFTING (CABG), with LIM to LAD, vein to left intermediate, vein to PD,  vein to OM, using right greater saphenous vein via endovein harvest.;  Surgeon: Grace Isaac, MD;  Location: [place];  Service: Open Heart Surgery;  Laterality: N/A;  . DIAGNOSTIC LAPAROSCOPY     cholecystectomy and hiatal hernia repair  . ENDOVEIN HARVEST OF GREATER SAPHENOUS VEIN Right 01/19/2015   Procedure: ENDOVEIN HARVEST OF GREATER SAPHENOUS VEIN;  Surgeon: Grace Isaac, MD;  Location: [place];  Service: Open Heart Surgery;  Laterality: Right;  . ESOPHAGOGASTRODUODENOSCOPY  02/19/13  . ESOPHAGOGASTRODUODENOSCOPY (EGD) WITH PROPOFOL N/A 08/09/2015   Procedure: ESOPHAGOGASTRODUODENOSCOPY (EGD) WITH PROPOFOL;  Surgeon: Hulen Luster, MD;  Location: [facility] ENDOSCOPY;  Service: Gastroenterology;  Laterality: N/A;  . ESOPHAGOGASTRODUODENOSCOPY (EGD) WITH PROPOFOL N/A 04/13/2017   Procedure: ESOPHAGOGASTRODUODENOSCOPY (EGD) WITH PROPOFOL;  Surgeon: Toledo, Benay Pike, MD;  Location: [facility] ENDOSCOPY;  Service: Endoscopy;  Laterality: N/A;  . ESOPHAGUS SURGERY    . EYE SURGERY     cataracts BIL  . HERNIA REPAIR    . HIATAL HERNIA REPAIR N/A   . POLYPECTOMY    . TEE WITHOUT CARDIOVERSION N/A 01/19/2015   Procedure: TRANSESOPHAGEAL ECHOCARDIOGRAM (TEE);  Surgeon: Grace Isaac, MD;  Location: [place];  Service: Open Heart Surgery;  Laterality: N/A;    There were no vitals filed  for this visit.   Subjective: Patient behavior: (alertness, ability to follow instructions, etc.):  The patient is alert, able to express his swallowing concerns, and follow directions.  Chief complaint: Patient reports choking when he leans back in his recliner and that some foods (bread) require multiple swallows   Objective:  Radiological Procedure: A videoflouroscopic evaluation of oral-preparatory, reflex initiation, and pharyngeal phases of the swallow was performed; as well as a screening of the upper esophageal phase.  I. POSTURE: Upright in MBS chair  II. VIEW: Lateral  III. COMPENSATORY STRATEGIES:  N/A  IV. BOLUSES ADMINISTERED:   Thin Liquid: 1 small, 3 rapid consecutive   Nectar-thick Liquid: 1 moderate   Honey-thick Liquid: DNT   Puree: 2 teaspoon presentations   Mechanical Soft: 1/4 graham cracker in applesauce   Barium tablet  V. RESULTS OF EVALUATION: A. ORAL PREPARATORY PHASE: (The lips, tongue, and velum are observed for strength and coordination)       **Overall Severity Rating: within normal limits   B. SWALLOW INITIATION/REFLEX: (The reflex is normal if "triggered" by the time the bolus reached the base of the tongue)  **Overall Severity Rating: Mild; triggers while falling from the valleculae to the pyriform sinuses with liquids  C. PHARYNGEAL PHASE: (Pharyngeal function is normal if the bolus shows rapid, smooth, and continuous transit through the pharynx and there is no pharyngeal residue after the swallow)  **Overall Severity Rating: Minimal; decreased hyolaryngeal excursion with trace pharyngeal residue  D. LARYNGEAL PENETRATION: (Material entering into the laryngeal inlet/vestibule but not aspirated) NONE  E. ASPIRATION: NONE  F. ESOPHAGEAL PHASE: (Screening of the upper esophagus)  Barium tablet passed rapidly through the cervical esophagus  ASSESSMENT: This 77 year old man; with meningioma and complaint of difficulty swallowing; is presenting with minimal oropharyngeal dysphagia characterized by delayed pharyngeal swallow initiation, reduced pharyngeal pressure generation (decreased hyolaryngeal excursion), and trace pharyngeal residue.  Oral control of the bolus including oral hold, rotary mastication, and anterior to posterior transfer is within normal limits.   There is no observed laryngeal penetration or tracheal aspiration.  The patient is not at significant risk for prandial aspiration and his complaints do not appear to be due to oropharyngeal swallow function.  Counseled the patient that currently his swallowing is safe and he should monitor for  clinical indicators of aspiration- increased choking/coughing with eating and drinking.  PLAN/RECOMMENDATIONS:   A. Diet: Regular- soften/moisten as needed to ease passage through esophagus   B. Swallowing Precautions: monitor for clinical indicators of aspiration- increased choking/coughing with meals   C. Recommended consultation to: GI if esophageal symptoms worsen   D. Therapy recommendations: speech therapy is not indicated   E. Results and recommendations were discussed with the patient immediately following the study and the final report routed to the referring MD   Oropharyngeal dysphagia - Plan: DG OP Swallowing Func-Medicare/Speech Path, DG OP Swallowing Func-Medicare/Speech Path        Problem List Patient Active Problem List   Diagnosis Date Noted  . Glaucoma (increased eye pressure) 12/19/2017  . DDD (degenerative disc disease), lumbosacral 12/05/2017  . Urinary frequency 06/21/2017  . Urinary urgency 06/21/2017  . Bilateral carotid artery stenosis 04/11/2017  . Atrial fibrillation ([place]) 01/30/2015  . 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 Stable angina ([place]) 01/07/2015  . Chronic diastolic CHF (congestive heart failure), NYHA class 2 ([place]) 12/16/2014  . Arteriosclerosis of coronary artery 12/10/2014  . GERD (gastroesophageal reflux disease) 06/16/2014  . Benign essential HTN 06/05/2014  .  Mixed hyperlipidemia 06/05/2014  . Moderate mitral insufficiency 06/05/2014  . Chest pain 01/12/2014  . Angina pectoris ([place]) 10/09/2013  . 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 Esophageal disease 10/09/2013  . Benign prostatic hyperplasia with urinary obstruction 04/01/2012  . 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 ED (erectile dysfunction) of organic origin 04/01/2012  . Elevated prostate specific antigen (PSA) 04/01/2012  . 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

## 2018-05-30 DIAGNOSIS — D329 Benign neoplasm of meninges, unspecified: Secondary | ICD-10-CM | POA: Insufficient documentation

## 2018-08-02 ENCOUNTER — Other Ambulatory Visit: Payer: Self-pay

## 2018-08-02 DIAGNOSIS — N401 Enlarged prostate with lower urinary tract symptoms: Principal | ICD-10-CM

## 2018-08-02 DIAGNOSIS — N138 Other obstructive and reflux uropathy: Secondary | ICD-10-CM

## 2018-08-02 MED ORDER — DUTASTERIDE 0.5 MG PO CAPS
0.5000 mg | ORAL_CAPSULE | Freq: Every day | ORAL | 6 refills | Status: DC
Start: 2018-08-02 — End: 2019-10-11

## 2018-08-19 ENCOUNTER — Other Ambulatory Visit: Payer: Self-pay

## 2018-08-19 DIAGNOSIS — R972 Elevated prostate specific antigen [PSA]: Secondary | ICD-10-CM

## 2018-08-20 ENCOUNTER — Other Ambulatory Visit: Payer: Medicare Other

## 2018-08-20 DIAGNOSIS — R972 Elevated prostate specific antigen [PSA]: Secondary | ICD-10-CM

## 2018-08-21 LAB — PSA: Prostate Specific Ag, Serum: 4 ng/mL (ref 0.0–4.0)

## 2018-08-23 ENCOUNTER — Ambulatory Visit (INDEPENDENT_AMBULATORY_CARE_PROVIDER_SITE_OTHER): Payer: Medicare Other | Admitting: Urology

## 2018-08-23 ENCOUNTER — Encounter: Payer: Self-pay | Admitting: Urology

## 2018-08-23 VITALS — BP 120/73 | HR 62 | Ht 74.0 in | Wt 215.0 lb

## 2018-08-23 DIAGNOSIS — N401 Enlarged prostate with lower urinary tract symptoms: Secondary | ICD-10-CM | POA: Diagnosis not present

## 2018-08-23 DIAGNOSIS — N138 Other obstructive and reflux uropathy: Secondary | ICD-10-CM | POA: Diagnosis not present

## 2018-08-23 DIAGNOSIS — R972 Elevated prostate specific antigen [PSA]: Secondary | ICD-10-CM

## 2018-08-23 NOTE — Progress Notes (Signed)
08/23/2018 10:34 AM   Bradley [name] Dec 28, 1940 [id number]  Referring provider: Sofie Hartigan, MD [name], [name] [address]  Chief Complaint  Patient presents with  . Follow-up    69month   Urologic history: 1.  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  BPH with lower urinary tract symptoms  -On dutasteride, oxybutynin  -PVP 2008  3.  Erectile dysfunction  -Uses vacuum erection device periodically   HPI: 78 year old male presents for semiannual follow-up.  He was seen in August for worsening nocturia.  He had been on Myrbetriq however was unable to afford.  At the last visit he was given a trial of immediate release oxybutynin to take at bedtime.  He states he remains on this medication however is taking it in the morning.  Overall he feels his nocturia has improved and he gets up once per night on average.  His daytime urgency has also improved.  He currently has no bothersome lower urinary tract symptoms.  A PSA performed January 2020 was stable at 4.0 (uncorrected).  Denies dysuria, gross hematuria or flank/abdominal/pelvic/scrotal pain.   PMH: Past Medical History:  Diagnosis Date  . Allergy   . Barrett's esophagus   . Blockage of coronary artery of heart ([place])   . BPH (benign prostatic hyperplasia)   . Cataracts, bilateral   . Chronic diastolic CHF (congestive heart failure), NYHA class 2 ([place]) 12/16/2014  . Colon polyp   . Coronary artery disease   . DDD (degenerative disc disease), lumbosacral 12/05/2017  . Essential hypertension 06/05/2014  . GERD (gastroesophageal reflux disease)    Barretts esophagus  . GI bleed    a. ~2012 around time of hiatal hernia surgery. 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  . Glaucoma   . Hemorrhoids   . History of chicken pox   . History of hiatal hernia 2012  . Hyperlipidemia   . Meningioma ([place])     stable, followed by yearly MRIs  . Migraines   . RBBB   . Stomach ulcer     Surgical History: Past Surgical History:  Procedure Laterality Date  . ATHERECTOMY  1992  . CARDIAC CATHETERIZATION N/A 01/07/2015   Procedure: Left Heart Cath;  Surgeon: Corey Skains, MD;  Location: [place] [facility];  Service: Cardiovascular;  Laterality: N/A;  . CARDIAC SURGERY     coronary angioplasty  . CHOLECYSTECTOMY    . COLONOSCOPY  11/03/08, 03/09/14  . CORONARY ANGIOPLASTY    . CORONARY ARTERY BYPASS GRAFT N/A 01/19/2015   Procedure: CORONARY ARTERY BYPASS GRAFTING (CABG), with LIM to LAD, vein to left intermediate, vein to PD, vein to OM, using right greater saphenous vein via endovein harvest.;  Surgeon: Grace Isaac, MD;  Location: [place];  Service: Open Heart Surgery;  Laterality: N/A;  . DIAGNOSTIC LAPAROSCOPY     cholecystectomy and hiatal hernia repair  . ENDOVEIN HARVEST OF GREATER SAPHENOUS VEIN Right 01/19/2015   Procedure: ENDOVEIN HARVEST OF GREATER SAPHENOUS VEIN;  Surgeon: Grace Isaac, MD;  Location: [place];  Service: Open Heart Surgery;  Laterality: Right;  . ESOPHAGOGASTRODUODENOSCOPY  02/19/13  . ESOPHAGOGASTRODUODENOSCOPY (EGD) WITH PROPOFOL N/A 08/09/2015   Procedure: ESOPHAGOGASTRODUODENOSCOPY (EGD) WITH PROPOFOL;  Surgeon: Hulen Luster, MD;  Location: [facility];  Service: Gastroenterology;  Laterality: N/A;  . ESOPHAGOGASTRODUODENOSCOPY (EGD) WITH PROPOFOL N/A 04/13/2017   Procedure: ESOPHAGOGASTRODUODENOSCOPY (EGD) WITH  PROPOFOL;  Surgeon: Toledo, Benay Pike, MD;  Location: [facility] ENDOSCOPY;  Service: Endoscopy;  Laterality: N/A;  . ESOPHAGUS SURGERY    . EYE SURGERY     cataracts BIL  . HERNIA REPAIR    . HIATAL HERNIA REPAIR N/A   . POLYPECTOMY    . TEE WITHOUT CARDIOVERSION N/A 01/19/2015   Procedure: TRANSESOPHAGEAL ECHOCARDIOGRAM (TEE);  Surgeon: Grace Isaac, MD;  Location: [place];  Service: Open Heart Surgery;  Laterality: N/A;    Home Medications:    Allergies as of 08/23/2018      Reactions   Metoprolol    Oxycodone Other (See Comments)   Reaction:  Hypotension    Sulfadiazine    Sulfa Antibiotics Rash      Medication List       Accurate as of August 23, 2018 10:34 AM. Always use your most recent med list.        aspirin 81 MG tablet Take 81 mg by mouth daily.   atorvastatin 20 MG tablet Commonly known as:  LIPITOR Take 20 mg by mouth daily.   cetirizine 5 MG tablet Commonly known as:  ZYRTEC Take 5 mg by mouth daily.   chlorhexidine 0.12 % solution Commonly known as:  PERIDEX Use as directed 15 mLs in the mouth or throat 2 (two) times daily.   cyanocobalamin 100 MCG tablet Take 100 mcg by mouth daily.   dicyclomine 10 MG capsule Commonly known as:  BENTYL   diphenhydrAMINE 25 mg capsule Commonly known as:  BENADRYL Take 25 mg by mouth every 6 (six) hours as needed for itching.   dutasteride 0.5 MG capsule Commonly known as:  AVODART Take 1 capsule (0.5 mg total) by mouth daily.   fluticasone 50 MCG/ACT nasal spray Commonly known as:  FLONASE   hydrocortisone cream 1 % Apply 1 application topically as needed for itching.   latanoprost 0.005 % ophthalmic solution Commonly known as:  XALATAN Place 1 drop into both eyes at bedtime.   nitroGLYCERIN 0.4 MG SL tablet Commonly known as:  NITROSTAT Place 0.4 mg under the tongue every 5 (five) minutes as needed for chest pain.   omeprazole 20 MG capsule Commonly known as:  PRILOSEC Take 20 mg by mouth daily.   oxybutynin 5 MG tablet Commonly known as:  DITROPAN 1 tab at bedtime and may take in a.m. as needed for voiding symptoms.   predniSONE 20 MG tablet Commonly known as:  DELTASONE Take 1 tablet (20 mg total) by mouth daily.   ranitidine 75 MG tablet Commonly known as:  ZANTAC Take 75 mg by mouth at bedtime.   timolol 0.25 % ophthalmic solution Commonly known as:  BETIMOL Place 1 drop into both eyes daily.   triamcinolone cream 0.1  % Commonly known as:  KENALOG Apply 1 application topically 2 (two) times daily as needed (Foot).       Allergies:  Allergies  Allergen Reactions  . Metoprolol   . Oxycodone Other (See Comments)    Reaction:  Hypotension   . Sulfadiazine   . Sulfa Antibiotics Rash    Family History: Family History  Problem Relation Age of Onset  . Parkinsonism Mother   . Kidney disease Father   . Cancer Father        prostate  . Heart disease Father   . Cancer Paternal Uncle        esophageal  . Cancer Paternal Uncle     Social History:  reports that he has  never smoked. He has never used smokeless tobacco. He reports that he does not drink alcohol or use drugs.  ROS: UROLOGY Frequent Urination?: No Hard to postpone urination?: Yes Burning/pain with urination?: No Get up at night to urinate?: Yes Leakage of urine?: No Urine stream starts and stops?: No Trouble starting stream?: No Do you have to strain to urinate?: No Blood in urine?: No Urinary tract infection?: No Sexually transmitted disease?: No Injury to kidneys or bladder?: No Painful intercourse?: No Weak stream?: No Erection problems?: No Penile pain?: No  Gastrointestinal Nausea?: No Vomiting?: No Indigestion/heartburn?: No Diarrhea?: No Constipation?: No  Constitutional Fever: No Night sweats?: No Weight loss?: No Fatigue?: No  Skin Skin rash/lesions?: No Itching?: No  Eyes Blurred vision?: No Double vision?: No  Ears/Nose/Throat Sore throat?: No Sinus problems?: No  Hematologic/Lymphatic Swollen glands?: No Easy bruising?: Yes  Cardiovascular Leg swelling?: No Chest pain?: No  Respiratory Cough?: No Shortness of breath?: No  Endocrine Excessive thirst?: No  Musculoskeletal Back pain?: No Joint pain?: No  Neurological Headaches?: No Dizziness?: No  Psychologic Depression?: No Anxiety?: No  Physical Exam: BP 120/73   Pulse 62   Ht 6\' 2"  (1.88 m)   Wt 215 lb (97.5 kg)    BMI 27.60 kg/m   Constitutional:  Alert and oriented, No acute distress. HEENT: [place] AT, moist mucus membranes.  Trachea midline, no masses. Cardiovascular: No clubbing, cyanosis, or edema. Respiratory: Normal respiratory effort, no increased work of breathing. Skin: No rashes, bruises or suspicious lesions. Neurologic: Grossly intact, no focal deficits, moving all 4 extremities. Psychiatric: Normal mood and affect.   Assessment & Plan:   78 year old male with BPH.  His voiding pattern is stable on dutasteride and a morning dose of immediate release oxybutynin.  He is currently satisfied with his voiding pattern.  PSA is stable.  He desires to continue semiannual follow-up.  Return in about 6 months (around 02/21/2019) for Recheck.   Abbie Sons, [place] [address], [place], [place] [address] [phone]

## 2018-08-28 ENCOUNTER — Other Ambulatory Visit: Payer: Self-pay

## 2018-08-28 ENCOUNTER — Ambulatory Visit: Payer: Medicare Other | Attending: Neurology

## 2018-08-28 DIAGNOSIS — M6281 Muscle weakness (generalized): Secondary | ICD-10-CM | POA: Insufficient documentation

## 2018-08-28 DIAGNOSIS — R2689 Other abnormalities of gait and mobility: Secondary | ICD-10-CM | POA: Diagnosis present

## 2018-08-28 NOTE — Therapy (Signed)
[facility] [address] [place], [place], [address] Phone: [phone]   Fax:  [phone]  Physical Therapy Evaluation  Patient Details  Name: Bradley [name] MRN: [id number] Date of Birth: 1941/05/24 Referring Provider (PT): Jennings Books   Encounter Date: 08/28/2018  PT End of Session - 08/28/18 1652    Visit Number  1    Number of Visits  16    Authorization Type  medicare    Authorization - Visit Number  1    Authorization - Number of Visits  10    PT Start Time  2119    PT Stop Time  1536    PT Time Calculation (min)  63 min    Equipment Utilized During Treatment  Gait belt    Activity Tolerance  Patient tolerated treatment well    Behavior During Therapy  [facility] for tasks assessed/performed       Past Medical History:  Diagnosis Date  . Allergy   . Barrett's esophagus   . Blockage of coronary artery of heart ([place])   . BPH (benign prostatic hyperplasia)   . Cataracts, bilateral   . Chronic diastolic CHF (congestive heart failure), NYHA class 2 ([place]) 12/16/2014  . Colon polyp   . Coronary artery disease   . DDD (degenerative disc disease), lumbosacral 12/05/2017  . Essential hypertension 06/05/2014  . GERD (gastroesophageal reflux disease)    Barretts esophagus  . GI bleed    a. ~2012 around time of hiatal hernia surgery. 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  . Glaucoma   . Hemorrhoids   . History of chicken pox   . History of hiatal hernia 2012  . Hyperlipidemia   . Meningioma ([place])    stable, followed by yearly MRIs  . Migraines   . RBBB   . Stomach ulcer     Past Surgical History:  Procedure Laterality Date  . ATHERECTOMY  1992  . CARDIAC CATHETERIZATION N/A 01/07/2015   Procedure: Left Heart Cath;  Surgeon: Corey Skains, MD;  Location: [place] [facility];  Service: Cardiovascular;  Laterality: N/A;  . CARDIAC SURGERY     coronary angioplasty  . CHOLECYSTECTOMY    .  COLONOSCOPY  11/03/08, 03/09/14  . CORONARY ANGIOPLASTY    . CORONARY ARTERY BYPASS GRAFT N/A 01/19/2015   Procedure: CORONARY ARTERY BYPASS GRAFTING (CABG), with LIM to LAD, vein to left intermediate, vein to PD, vein to OM, using right greater saphenous vein via endovein harvest.;  Surgeon: Grace Isaac, MD;  Location: [place];  Service: Open Heart Surgery;  Laterality: N/A;  . DIAGNOSTIC LAPAROSCOPY     cholecystectomy and hiatal hernia repair  . ENDOVEIN HARVEST OF GREATER SAPHENOUS VEIN Right 01/19/2015   Procedure: ENDOVEIN HARVEST OF GREATER SAPHENOUS VEIN;  Surgeon: Grace Isaac, MD;  Location: [place];  Service: Open Heart Surgery;  Laterality: Right;  . ESOPHAGOGASTRODUODENOSCOPY  02/19/13  . ESOPHAGOGASTRODUODENOSCOPY (EGD) WITH PROPOFOL N/A 08/09/2015   Procedure: ESOPHAGOGASTRODUODENOSCOPY (EGD) WITH PROPOFOL;  Surgeon: Hulen Luster, MD;  Location: [facility] ENDOSCOPY;  Service: Gastroenterology;  Laterality: N/A;  . ESOPHAGOGASTRODUODENOSCOPY (EGD) WITH PROPOFOL N/A 04/13/2017   Procedure: ESOPHAGOGASTRODUODENOSCOPY (EGD) WITH PROPOFOL;  Surgeon: Toledo, Benay Pike, MD;  Location: [facility] ENDOSCOPY;  Service: Endoscopy;  Laterality: N/A;  . ESOPHAGUS SURGERY    . EYE SURGERY     cataracts BIL  . HERNIA REPAIR    . HIATAL HERNIA REPAIR N/A   . POLYPECTOMY    .  TEE WITHOUT CARDIOVERSION N/A 01/19/2015   Procedure: TRANSESOPHAGEAL ECHOCARDIOGRAM (TEE);  Surgeon: Grace Isaac, MD;  Location: [place];  Service: Open Heart Surgery;  Laterality: N/A;    There were no vitals filed for this visit.   Subjective Assessment - 08/28/18 1439    Subjective  Patient reported that his main goal is to improve his balance, he wants to stay healthy so he can continue to assist his wife.    Pertinent History  PMH CHF, CAD, s/p CABG, HLD, meningioma (stable followed by yearly MRIs), migraines. Pt also reports that his neurologist assesses him for Parkinson's but has not been diagnosed yet. Stated that he feels  like he needs PT due to some weakness he has in his legs, his balance, and to avoid falling.    Limitations  Standing;Walking;House hold activities    How long can you sit comfortably?  NA    How long can you stand comfortably?  5 mins    How long can you walk comfortably?  10-80mins with TM support    Patient Stated Goals  To improve balance, strengthening    Currently in Pain?  No/denies       OBJECTIVE  MUSCULOSKELETAL: Tremor: Absent Bulk: Normal Tone: Normal  Posture Forward head, rounded shoulders in seated and standing positions  Gait Patient ambulates with slouched shoulders, decreased arm swing, intermittent dragging of feet, decreased stride length and step length with fatigue.  Strength R/L 4+/4 Hip flexion 5/4+ Hip abduction 5/5 Hip adduction 5/4+ Knee extension 4+/4+ Knee flexion 5/5 Ankle Dorsiflexion    NEUROLOGICAL:  Mental Status Patient is oriented to person, place and time.  Recent memory is intact.  Remote memory is intact.  Attention span and concentration are intact.  Expressive speech is intact.  Patient's fund of knowledge is within normal limits for educational level.    Sensation Patient reported impaired sensation of bilateral feet. Skin assessed no sores noted.  Kinesthesia also assessed this session, impaired with great toe, R >L, bilateral ankles WFLs   Coordination/Cerebellar Finger to Nose: WNL Heel to Shin: WNL Rapid alternating movements: WNL Finger Opposition: WNL Pronator Drift: Negative  FUNCTIONAL OUTCOME MEASURES   Results Comments      DGI 22/24       TUG 9.10 seconds   5TSTS 10.3 seconds               6 Minute Walk Test feet     POSTURAL CONTROL TESTS   Clinical Test of Sensory Interaction for Balance    (CTSIB):  CONDITION TIME STRATEGY SWAY  Eyes open, firm surface (FT) 30 seconds ankle normal  Eyes closed, firm surface (FT) 30 seconds ankle mild  Eyes open, foam surface (FT) 30 seconds ankle mild   Eyes closed, foam surface (feet togther) 30 seconds ankle moderate     Objective measurements completed on examination: See above findings.   TREATMENT: Therapeutic exercises: Performed with verbal and visual cues, initial demonstration from PT as well. Goal; improve strength, mobility  Pt and pt reviewed 10x sit to stands for HEP Standing marching with UE support x10 bilaterally Standing hip extension with UE support x10 bilaterally   PT Education - 08/28/18 1652    Education Details  POC, PT role, HEP    Person(s) Educated  Patient    Methods  Explanation;Handout;Verbal cues;Demonstration    Comprehension  Verbalized understanding       PT Short Term Goals - 08/28/18 1653      PT  SHORT TERM GOAL #1   Title  Patient will be compliant with initial HEP in prep for self management of condition    Baseline  administered today    Time  4    Period  Weeks    Status  New    Target Date  09/25/18        PT Long Term Goals - 08/28/18 1653      PT LONG TERM GOAL #1   Title  Pt will be independent with finalized HEP/gym program in order to improve strength and balance in order to decrease fall risk and improve function at home and work.     Baseline  initial HEP administered at eval (08/28/2018)    Time  8    Period  Weeks    Status  New    Target Date  10/23/18      PT LONG TERM GOAL #2   Title  Pt will increase 6MWT by at least 17m (168ft) in order to demonstrate clinically significant improvement in cardiopulmonary endurance and community ambulation     Baseline  667ft on eval (2/13)    Time  8    Period  Weeks    Status  New    Target Date  10/23/18      PT LONG TERM GOAL #3   Title  The patient will report after 6MWT mild SOB/fatigue demonstrated improved activity tolerance/endurance with functional activities    Baseline  pt reported fatigue/SOB as 4/10 on eval (08/28/2018)    Time  8    Period  Weeks    Status  New    Target Date  10/23/18      PT LONG TERM  GOAL #4   Title  The patient will exhibit proper gait mechanics including step length, upright posture and improved bilateral arm swing to promote normalized gait pattern.    Baseline  Pt ambulates with shuffling step, slouched posture, and decreased  arm swing. Able to momentarily correct with verbal cues    Time  8    Period  Weeks    Status  New    Target Date  10/23/18      PT LONG TERM GOAL #5   Title  The patient will demonstrate at least 1/2 MMT grade improvement to indicate improved functional strength and improve ability to perform functional activities.     Baseline  see objective measures (08/28/2018)    Time  8    Period  Weeks    Status  New    Target Date  10/23/18             Plan - 08/28/18 1814    Clinical Impression Statement   Pt is a 78 year-old male referred for difficulty with balance. PT examination reveals deficits in gait, posture, balance, strength, endurance and activity tolerance. These deficits impede the patients ability to perform functional tasks and increases risk of falls. The pt will benefit from skilled PT services to address deficits in balance and decrease risk for future falls.     History and Personal Factors relevant to plan of care:  s/p CABG, CHF, DDD, HTN, HLD, meingioma, migraines    Clinical Presentation  Evolving    Clinical Presentation due to:  nature of condition    Clinical Decision Making  Moderate    Rehab Potential  Good    Clinical Impairments Affecting Rehab Potential  +motivation, sedentary lifestyle, nature of condition    PT Frequency  2x / week    PT Duration  8 weeks    PT Treatment/Interventions  ADLs/Self Care Home Management;Moist Heat;Electrical Stimulation;Gait training;Stair training;DME Instruction;Functional mobility training;Therapeutic activities;Therapeutic exercise;Balance training;Patient/family education;Neuromuscular re-education;Manual techniques;Energy conservation;Joint Manipulations;Spinal Manipulations     PT Next Visit Plan  gait training, balance training, strengthening,     PT Home Exercise Plan  see pt instruction section; sit to stands, standing hip extension, standing marching    Consulted and Agree with Plan of Care  Patient       Patient will benefit from skilled therapeutic intervention in order to improve the following deficits and impairments:  Abnormal gait, Decreased balance, Decreased endurance, Decreased mobility, Difficulty walking, Increased muscle spasms, Cardiopulmonary status limiting activity, Decreased range of motion, Improper body mechanics, Decreased activity tolerance, Decreased strength, Postural dysfunction  Visit Diagnosis: Other abnormalities of gait and mobility  Muscle weakness (generalized)     Problem List Patient Active Problem List   Diagnosis Date Noted  . Glaucoma (increased eye pressure) 12/19/2017  . DDD (degenerative disc disease), lumbosacral 12/05/2017  . Urinary frequency 06/21/2017  . Urinary urgency 06/21/2017  . Bilateral carotid artery stenosis 04/11/2017  . Atrial fibrillation ([place]) 01/30/2015  . 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 Stable angina ([place]) 01/07/2015  . Chronic diastolic CHF (congestive heart failure), NYHA class 2 ([place]) 12/16/2014  . Arteriosclerosis of coronary artery 12/10/2014  . GERD (gastroesophageal reflux disease) 06/16/2014  . Benign essential HTN 06/05/2014  . Mixed hyperlipidemia 06/05/2014  . Moderate mitral insufficiency 06/05/2014  . Chest pain 01/12/2014  . Angina pectoris ([place]) 10/09/2013  . 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 Esophageal disease 10/09/2013  . Benign prostatic hyperplasia with urinary obstruction 04/01/2012  . 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 ED (erectile dysfunction) of organic origin 04/01/2012  . Elevated prostate specific antigen (PSA) 04/01/2012  . Benign neoplasm of cerebral meninges [facility]) 04/24/2011    Lieutenant Diego PT, DPT 11:46 AM,08/29/18 [phone]  [facility]  [facility] [address] [place], [place], [address] Phone: [phone]   Fax:  [phone]  Name: Bradley [name] MRN: [id number] Date of Birth: Jun 09, 1941

## 2018-08-28 NOTE — Patient Instructions (Signed)
PT administered:  Sit to stands x10 ea day, no use of arms Standing marching x10 bilaterally with UE support daily Standing hip extension with UE support x10 daily

## 2018-08-29 ENCOUNTER — Telehealth: Payer: Self-pay

## 2018-08-29 MED ORDER — OXYBUTYNIN CHLORIDE 5 MG PO TABS: ORAL_TABLET | ORAL | 3 refills | Status: DC

## 2018-08-29 NOTE — Telephone Encounter (Signed)
Oxybutynin refill sent to pharmacy

## 2018-09-03 ENCOUNTER — Ambulatory Visit: Payer: Medicare Other | Admitting: Physical Therapy

## 2018-09-03 DIAGNOSIS — R2689 Other abnormalities of gait and mobility: Secondary | ICD-10-CM | POA: Diagnosis not present

## 2018-09-03 DIAGNOSIS — M6281 Muscle weakness (generalized): Secondary | ICD-10-CM

## 2018-09-03 NOTE — Therapy (Signed)
[facility] [address] [place], [place], [address] Phone: [phone]   Fax:  [phone]  Physical Therapy Treatment  Patient Details  Name: Bradley [name] MRN: [id number] Date of Birth: 09-19-1940 Referring Provider (PT): Jennings Books   Encounter Date: 09/03/2018  PT End of Session - 09/03/18 1442    Visit Number  2    Number of Visits  16    Authorization Type  medicare    Authorization - Visit Number  2    Authorization - Number of Visits  10    PT Start Time  0347    PT Stop Time  1342    PT Time Calculation (min)  39 min    Equipment Utilized During Treatment  Gait belt    Activity Tolerance  Patient tolerated treatment well    Behavior During Therapy  [facility] for tasks assessed/performed       Past Medical History:  Diagnosis Date  . Allergy   . Barrett's esophagus   . Blockage of coronary artery of heart ([place])   . BPH (benign prostatic hyperplasia)   . Cataracts, bilateral   . Chronic diastolic CHF (congestive heart failure), NYHA class 2 ([place]) 12/16/2014  . Colon polyp   . Coronary artery disease   . DDD (degenerative disc disease), lumbosacral 12/05/2017  . Essential hypertension 06/05/2014  . GERD (gastroesophageal reflux disease)    Barretts esophagus  . GI bleed    a. ~2012 around time of hiatal hernia surgery. 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  . Glaucoma   . Hemorrhoids   . History of chicken pox   . History of hiatal hernia 2012  . Hyperlipidemia   . Meningioma ([place])    stable, followed by yearly MRIs  . Migraines   . RBBB   . Stomach ulcer     Past Surgical History:  Procedure Laterality Date  . ATHERECTOMY  1992  . CARDIAC CATHETERIZATION N/A 01/07/2015   Procedure: Left Heart Cath;  Surgeon: Corey Skains, MD;  Location: [place] CV [facility];  Service: Cardiovascular;  Laterality: N/A;  . CARDIAC SURGERY     coronary angioplasty  . CHOLECYSTECTOMY    .  COLONOSCOPY  11/03/08, 03/09/14  . CORONARY ANGIOPLASTY    . CORONARY ARTERY BYPASS GRAFT N/A 01/19/2015   Procedure: CORONARY ARTERY BYPASS GRAFTING (CABG), with LIM to LAD, vein to left intermediate, vein to PD, vein to OM, using right greater saphenous vein via endovein harvest.;  Surgeon: Grace Isaac, MD;  Location: [place];  Service: Open Heart Surgery;  Laterality: N/A;  . DIAGNOSTIC LAPAROSCOPY     cholecystectomy and hiatal hernia repair  . ENDOVEIN HARVEST OF GREATER SAPHENOUS VEIN Right 01/19/2015   Procedure: ENDOVEIN HARVEST OF GREATER SAPHENOUS VEIN;  Surgeon: Grace Isaac, MD;  Location: [place];  Service: Open Heart Surgery;  Laterality: Right;  . ESOPHAGOGASTRODUODENOSCOPY  02/19/13  . ESOPHAGOGASTRODUODENOSCOPY (EGD) WITH PROPOFOL N/A 08/09/2015   Procedure: ESOPHAGOGASTRODUODENOSCOPY (EGD) WITH PROPOFOL;  Surgeon: Hulen Luster, MD;  Location: [facility] ENDOSCOPY;  Service: Gastroenterology;  Laterality: N/A;  . ESOPHAGOGASTRODUODENOSCOPY (EGD) WITH PROPOFOL N/A 04/13/2017   Procedure: ESOPHAGOGASTRODUODENOSCOPY (EGD) WITH PROPOFOL;  Surgeon: Toledo, Benay Pike, MD;  Location: [facility] ENDOSCOPY;  Service: Endoscopy;  Laterality: N/A;  . ESOPHAGUS SURGERY    . EYE SURGERY     cataracts BIL  . HERNIA REPAIR    . HIATAL HERNIA REPAIR N/A   . POLYPECTOMY    .  TEE WITHOUT CARDIOVERSION N/A 01/19/2015   Procedure: TRANSESOPHAGEAL ECHOCARDIOGRAM (TEE);  Surgeon: Grace Isaac, MD;  Location: [place];  Service: Open Heart Surgery;  Laterality: N/A;    There were no vitals filed for this visit.  Subjective Assessment - 09/03/18 1437    Subjective  Patient reported no new falls since last visit, 1 medication change for "memory" which pt stated he stopped taking because he felt it was affecting him negatively.  Pt agreeable when PT advised that pt notify physician who prescribed the medication.    Pertinent History  PMH CHF, CAD, s/p CABG, HLD, meningioma (stable followed by yearly MRIs),  migraines. Pt also reports that his neurologist assesses him for Parkinson's but has not been diagnosed yet. Stated that he feels like he needs PT due to some weakness he has in his legs, his balance, and to avoid falling.    Limitations  Standing;Walking;House hold activities    How long can you sit comfortably?  NA    How long can you stand comfortably?  5 mins    How long can you walk comfortably?  10-88mins with TM support    Patient Stated Goals  To improve balance, strengthening                               PT Education - 09/03/18 1441    Education Details  ther ex for strength and balance and HEP    Person(s) Educated  Patient    Methods  Explanation;Demonstration    Comprehension  Need further instruction       PT Short Term Goals - 08/28/18 1653      PT SHORT TERM GOAL #1   Title  Patient will be compliant with initial HEP in prep for self management of condition    Baseline  administered today    Time  4    Period  Weeks    Status  New    Target Date  09/25/18        PT Long Term Goals - 08/28/18 1653      PT LONG TERM GOAL #1   Title  Pt will be independent with finalized HEP/gym program in order to improve strength and balance in order to decrease fall risk and improve function at home and work.     Baseline  initial HEP administered at eval (08/28/2018)    Time  8    Period  Weeks    Status  New    Target Date  10/23/18      PT LONG TERM GOAL #2   Title  Pt will increase 6MWT by at least 55m (164ft) in order to demonstrate clinically significant improvement in cardiopulmonary endurance and community ambulation     Baseline  616ft on eval (2/13)    Time  8    Period  Weeks    Status  New    Target Date  10/23/18      PT LONG TERM GOAL #3   Title  The patient will report after 6MWT mild SOB/fatigue demonstrated improved activity tolerance/endurance with functional activities    Baseline  pt reported fatigue/SOB as 4/10 on eval  (08/28/2018)    Time  8    Period  Weeks    Status  New    Target Date  10/23/18      PT LONG TERM GOAL #4   Title  The patient will exhibit  proper gait mechanics including step length, upright posture and improved bilateral arm swing to promote normalized gait pattern.    Baseline  Pt ambulates with shuffling step, slouched posture, and decreased  arm swing. Able to momentarily correct with verbal cues    Time  8    Period  Weeks    Status  New    Target Date  10/23/18      PT LONG TERM GOAL #5   Title  The patient will demonstrate at least 1/2 MMT grade improvement to indicate improved functional strength and improve ability to perform functional activities.     Baseline  see objective measures (08/28/2018)    Time  8    Period  Weeks    Status  New    Target Date  10/23/18            Plan - 09/03/18 1512    Clinical Impression Statement  Pt did well with ther ex and balance training though he did report fatigue following standing exercises.  Pt experienced one lateral R LOB which he was able to self correct with use of parallel bars.  Pt very open to all education and demonstrated good carryover.  He presented with poor core strength during supine abdominal testing and was able to perform all abdominal strengthening exercises introduced today.  Pt reports that he was issued an HEP but has not started it yet.    Rehab Potential  Good    Clinical Impairments Affecting Rehab Potential  +motivation, sedentary lifestyle, nature of condition    PT Frequency  2x / week    PT Duration  8 weeks    PT Treatment/Interventions  ADLs/Self Care Home Management;Moist Heat;Electrical Stimulation;Gait training;Stair training;DME Instruction;Functional mobility training;Therapeutic activities;Therapeutic exercise;Balance training;Patient/family education;Neuromuscular re-education;Manual techniques;Energy conservation;Joint Manipulations;Spinal Manipulations    PT Next Visit Plan  gait training,  balance training, strengthening,     PT Home Exercise Plan  see pt instruction section; sit to stands, standing hip extension, standing marching    Consulted and Agree with Plan of Care  Patient       Patient will benefit from skilled therapeutic intervention in order to improve the following deficits and impairments:  Abnormal gait, Decreased balance, Decreased endurance, Decreased mobility, Difficulty walking, Increased muscle spasms, Cardiopulmonary status limiting activity, Decreased range of motion, Improper body mechanics, Decreased activity tolerance, Decreased strength, Postural dysfunction  Visit Diagnosis: Muscle weakness (generalized)  Other abnormalities of gait and mobility     Problem List Patient Active Problem List   Diagnosis Date Noted  . Glaucoma (increased eye pressure) 12/19/2017  . DDD (degenerative disc disease), lumbosacral 12/05/2017  . Urinary frequency 06/21/2017  . Urinary urgency 06/21/2017  . Bilateral carotid artery stenosis 04/11/2017  . Atrial fibrillation ([place]) 01/30/2015  . 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 Stable angina ([place]) 01/07/2015  . Chronic diastolic CHF (congestive heart failure), NYHA class 2 ([place]) 12/16/2014  . Arteriosclerosis of coronary artery 12/10/2014  . GERD (gastroesophageal reflux disease) 06/16/2014  . Benign essential HTN 06/05/2014  . Mixed hyperlipidemia 06/05/2014  . Moderate mitral insufficiency 06/05/2014  . Chest pain 01/12/2014  . Angina pectoris ([place]) 10/09/2013  . 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 Esophageal disease 10/09/2013  . Benign prostatic hyperplasia with urinary obstruction 04/01/2012  . 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 ED (erectile dysfunction) of organic origin 04/01/2012  . Elevated prostate specific antigen (PSA) 04/01/2012  . Benign neoplasm of cerebral meninges ([place]) 04/24/2011  Roxanne Gates 09/03/2018, 4:17 PM  [facility] [address] [place], [place], [address] Phone: [phone]   Fax:  [phone]  Name: RIGGINS CISEK MRN: [id number] Date of Birth: Nov 23, 1940

## 2018-09-03 NOTE — Therapy (Signed)
[facility] [address] [place], [place], [address] Phone: [phone]   Fax:  [phone]  Physical Therapy Treatment  Patient Details  Name: Bradley [name] MRN: [id number] Date of Birth: 1941-06-25 Referring Provider (PT): Jennings Books   Encounter Date: 09/03/2018  PT End of Session - 09/03/18 1442    Visit Number  2  (Pended)     Number of Visits  16  (Pended)     Authorization Type  medicare  (Pended)     Authorization - Visit Number  2  (Pended)     Authorization - Number of Visits  10  (Pended)     PT Start Time  4270  (Pended)     Equipment Utilized During Treatment  Gait belt  (Pended)     Activity Tolerance  Patient tolerated treatment well  (Pended)     Behavior During Therapy  WFL for tasks assessed/performed  (Pended)        Past Medical History:  Diagnosis Date  . Allergy   . Barrett's esophagus   . Blockage of coronary artery of heart ([place])   . BPH (benign prostatic hyperplasia)   . Cataracts, bilateral   . Chronic diastolic CHF (congestive heart failure), NYHA class 2 ([place]) 12/16/2014  . Colon polyp   . Coronary artery disease   . DDD (degenerative disc disease), lumbosacral 12/05/2017  . Essential hypertension 06/05/2014  . GERD (gastroesophageal reflux disease)    Barretts esophagus  . GI bleed    a. ~2012 around time of hiatal hernia surgery. 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  . Glaucoma   . Hemorrhoids   . History of chicken pox   . History of hiatal hernia 2012  . Hyperlipidemia   . Meningioma ([place])    stable, followed by yearly MRIs  . Migraines   . RBBB   . Stomach ulcer     Past Surgical History:  Procedure Laterality Date  . ATHERECTOMY  1992  . CARDIAC CATHETERIZATION N/A 01/07/2015   Procedure: Left Heart Cath;  Surgeon: Corey Skains, MD;  Location: [place] [facility];  Service: Cardiovascular;  Laterality: N/A;  . CARDIAC SURGERY     coronary  angioplasty  . CHOLECYSTECTOMY    . COLONOSCOPY  11/03/08, 03/09/14  . CORONARY ANGIOPLASTY    . CORONARY ARTERY BYPASS GRAFT N/A 01/19/2015   Procedure: CORONARY ARTERY BYPASS GRAFTING (CABG), with LIM to LAD, vein to left intermediate, vein to PD, vein to OM, using right greater saphenous vein via endovein harvest.;  Surgeon: Grace Isaac, MD;  Location: [place];  Service: Open Heart Surgery;  Laterality: N/A;  . DIAGNOSTIC LAPAROSCOPY     cholecystectomy and hiatal hernia repair  . ENDOVEIN HARVEST OF GREATER SAPHENOUS VEIN Right 01/19/2015   Procedure: ENDOVEIN HARVEST OF GREATER SAPHENOUS VEIN;  Surgeon: Grace Isaac, MD;  Location: [place];  Service: Open Heart Surgery;  Laterality: Right;  . ESOPHAGOGASTRODUODENOSCOPY  02/19/13  . ESOPHAGOGASTRODUODENOSCOPY (EGD) WITH PROPOFOL N/A 08/09/2015   Procedure: ESOPHAGOGASTRODUODENOSCOPY (EGD) WITH PROPOFOL;  Surgeon: Hulen Luster, MD;  Location[facility] ENDOSCOPY;  Service: Gastroenterology;  Laterality: N/A;  . ESOPHAGOGASTRODUODENOSCOPY (EGD) WITH PROPOFOL N/A 04/13/2017   Procedure: ESOPHAGOGASTRODUODENOSCOPY (EGD) WITH PROPOFOL;  Surgeon: Toledo, Benay Pike, MD;  Location: [facility] ENDOSCOPY;  Service: Endoscopy;  Laterality: N/A;  . ESOPHAGUS SURGERY    . EYE SURGERY     cataracts BIL  . HERNIA REPAIR    .  HIATAL HERNIA REPAIR N/A   . POLYPECTOMY    . TEE WITHOUT CARDIOVERSION N/A 01/19/2015   Procedure: TRANSESOPHAGEAL ECHOCARDIOGRAM (TEE);  Surgeon: Grace Isaac, MD;  Location: [place];  Service: Open Heart Surgery;  Laterality: N/A;    There were no vitals filed for this visit.  Subjective Assessment - 09/03/18 1437    Subjective  Patient reported no new falls since last visit, 1 medication change for "memory" which pt stated he stopped taking because he felt it was affecting him negatively.  Pt agreeable when PT advised that pt notify physician who prescribed the medication.    Pertinent History  PMH CHF, CAD, s/p CABG, HLD, meningioma  (stable followed by yearly MRIs), migraines. Pt also reports that his neurologist assesses him for Parkinson's but has not been diagnosed yet. Stated that he feels like he needs PT due to some weakness he has in his legs, his balance, and to avoid falling.    Limitations  Standing;Walking;House hold activities    How long can you sit comfortably?  NA    How long can you stand comfortably?  5 mins    How long can you walk comfortably?  10-65mins with TM support    Patient Stated Goals  To improve balance, strengthening        Therapeutic Exercise  Nustep: grips:10 ,seat: 12, speed:  70 spm, 5 min heel raises 2x20  toe raises 2x20 seated leanback x10, w/trunk rotation x10, with march x10  Bridge range 100% leg lower abdominal test: 0/5    Neuromuscular Re-education: foam: feet together 2x30,  tandem R/L: 2x 30 tandem walk: 2 laps 21 LOB self corrected with bars                        PT Education - 09/03/18 1441    Education Details  ther ex for strength and balance and HEP    Person(s) Educated  Patient    Methods  Explanation;Demonstration    Comprehension  Need further instruction       PT Short Term Goals - 08/28/18 1653      PT SHORT TERM GOAL #1   Title  Patient will be compliant with initial HEP in prep for self management of condition    Baseline  administered today    Time  4    Period  Weeks    Status  New    Target Date  09/25/18        PT Long Term Goals - 08/28/18 1653      PT LONG TERM GOAL #1   Title  Pt will be independent with finalized HEP/gym program in order to improve strength and balance in order to decrease fall risk and improve function at home and work.     Baseline  initial HEP administered at eval (08/28/2018)    Time  8    Period  Weeks    Status  New    Target Date  10/23/18      PT LONG TERM GOAL #2   Title  Pt will increase 6MWT by at least 7m (175ft) in order to demonstrate clinically significant  improvement in cardiopulmonary endurance and community ambulation     Baseline  665ft on eval (2/13)    Time  8    Period  Weeks    Status  New    Target Date  10/23/18      PT LONG TERM GOAL #3  Title  The patient will report after 6MWT mild SOB/fatigue demonstrated improved activity tolerance/endurance with functional activities    Baseline  pt reported fatigue/SOB as 4/10 on eval (08/28/2018)    Time  8    Period  Weeks    Status  New    Target Date  10/23/18      PT LONG TERM GOAL #4   Title  The patient will exhibit proper gait mechanics including step length, upright posture and improved bilateral arm swing to promote normalized gait pattern.    Baseline  Pt ambulates with shuffling step, slouched posture, and decreased  arm swing. Able to momentarily correct with verbal cues    Time  8    Period  Weeks    Status  New    Target Date  10/23/18      PT LONG TERM GOAL #5   Title  The patient will demonstrate at least 1/2 MMT grade improvement to indicate improved functional strength and improve ability to perform functional activities.     Baseline  see objective measures (08/28/2018)    Time  8    Period  Weeks    Status  New    Target Date  10/23/18              Patient will benefit from skilled therapeutic intervention in order to improve the following deficits and impairments:     Visit Diagnosis: Muscle weakness (generalized)  Other abnormalities of gait and mobility     Problem List Patient Active Problem List   Diagnosis Date Noted  . Glaucoma (increased eye pressure) 12/19/2017  . DDD (degenerative disc disease), lumbosacral 12/05/2017  . Urinary frequency 06/21/2017  . Urinary urgency 06/21/2017  . Bilateral carotid artery stenosis 04/11/2017  . Atrial fibrillation ([place]) 01/30/2015  . 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 Stable angina ([place]) 01/07/2015  . Chronic diastolic CHF (congestive heart failure), NYHA class 2 ([place]) 12/16/2014  . Arteriosclerosis  of coronary artery 12/10/2014  . GERD (gastroesophageal reflux disease) 06/16/2014  . Benign essential HTN 06/05/2014  . Mixed hyperlipidemia 06/05/2014  . Moderate mitral insufficiency 06/05/2014  . Chest pain 01/12/2014  . Angina pectoris ([place]) 10/09/2013  . 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 Esophageal disease 10/09/2013  . Benign prostatic hyperplasia with urinary obstruction 04/01/2012  . 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 ED (erectile dysfunction) of organic origin 04/01/2012  . Elevated prostate specific antigen (PSA) 04/01/2012  . 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 [address] [place], [place], [address] Phone: [phone]   Fax:  [phone]  Name: Bradley [name] MRN: [id number] Date of Birth: [birth year]

## 2018-09-05 ENCOUNTER — Encounter: Payer: Self-pay | Admitting: Physical Therapy

## 2018-09-05 ENCOUNTER — Ambulatory Visit: Payer: Medicare Other | Admitting: Physical Therapy

## 2018-09-05 DIAGNOSIS — M6281 Muscle weakness (generalized): Secondary | ICD-10-CM

## 2018-09-05 DIAGNOSIS — R2689 Other abnormalities of gait and mobility: Secondary | ICD-10-CM

## 2018-09-05 NOTE — Patient Instructions (Signed)
Access Code: [email or web address]  URL: [email or web address]  Date: 09/05/2018  Prepared by: Dorcas Carrow   Exercises  Standing Hip Abduction with Unilateral Counter Support - 10 reps - 2 sets - 1x daily - 7x weekly  Standing Hip Extension with Counter Support - 10 reps - 2 sets - 1x daily - 7x weekly  Standing March with Unilateral Counter Support - 10 reps - 2 sets - 1x daily - 7x weekly  Standing Hip Abduction with Counter Support - 10 reps - 2 sets - 1x daily - 7x weekly  Standing Hip Extension with Counter Support - 10 reps - 2 sets - 1x daily - 7x weekly  Shoulder Extension with Resistance - 10 reps - 2 sets - 1x daily - 7x weekly

## 2018-09-05 NOTE — Therapy (Signed)
[facility] [address] [place], [place], [address] Phone: [phone]   Fax:  [phone]  Physical Therapy Treatment  Patient Details  Name: Bradley [name] MRN: [id number] Date of Birth: November 09, 1940 Referring Provider (PT): Jennings Books   Encounter Date: 09/05/2018  PT End of Session - 09/05/18 1307    Visit Number  3    Number of Visits  16    Authorization Type  medicare    Authorization - Visit Number  3    Authorization - Number of Visits  10    PT Start Time  6073    PT Stop Time  7106    PT Time Calculation (min)  51 min    Equipment Utilized During Treatment  Gait belt    Activity Tolerance  Patient tolerated treatment well    Behavior During Therapy  [facility] for tasks assessed/performed       Past Medical History:  Diagnosis Date  . Allergy   . Barrett's esophagus   . Blockage of coronary artery of heart ([place])   . BPH (benign prostatic hyperplasia)   . Cataracts, bilateral   . Chronic diastolic CHF (congestive heart failure), NYHA class 2 ([place]) 12/16/2014  . Colon polyp   . Coronary artery disease   . DDD (degenerative disc disease), lumbosacral 12/05/2017  . Essential hypertension 06/05/2014  . GERD (gastroesophageal reflux disease)    Barretts esophagus  . GI bleed    a. ~2012 around time of hiatal hernia surgery. 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  . Glaucoma   . Hemorrhoids   . History of chicken pox   . History of hiatal hernia 2012  . Hyperlipidemia   . Meningioma ([place])    stable, followed by yearly MRIs  . Migraines   . RBBB   . Stomach ulcer     Past Surgical History:  Procedure Laterality Date  . ATHERECTOMY  1992  . CARDIAC CATHETERIZATION N/A 01/07/2015   Procedure: Left Heart Cath;  Surgeon: Corey Skains, MD;  Location: [place] [facility];  Service: Cardiovascular;  Laterality: N/A;  . CARDIAC SURGERY     coronary angioplasty  . CHOLECYSTECTOMY    .  COLONOSCOPY  11/03/08, 03/09/14  . CORONARY ANGIOPLASTY    . CORONARY ARTERY BYPASS GRAFT N/A 01/19/2015   Procedure: CORONARY ARTERY BYPASS GRAFTING (CABG), with LIM to LAD, vein to left intermediate, vein to PD, vein to OM, using right greater saphenous vein via endovein harvest.;  Surgeon: Grace Isaac, MD;  Location: [place];  Service: Open Heart Surgery;  Laterality: N/A;  . DIAGNOSTIC LAPAROSCOPY     cholecystectomy and hiatal hernia repair  . ENDOVEIN HARVEST OF GREATER SAPHENOUS VEIN Right 01/19/2015   Procedure: ENDOVEIN HARVEST OF GREATER SAPHENOUS VEIN;  Surgeon: Grace Isaac, MD;  Location: [place];  Service: Open Heart Surgery;  Laterality: Right;  . ESOPHAGOGASTRODUODENOSCOPY  02/19/13  . ESOPHAGOGASTRODUODENOSCOPY (EGD) WITH PROPOFOL N/A 08/09/2015   Procedure: ESOPHAGOGASTRODUODENOSCOPY (EGD) WITH PROPOFOL;  Surgeon: Hulen Luster, MD;  Location: [facility]OSCOPY;  Service: Gastroenterology;  Laterality: N/A;  . ESOPHAGOGASTRODUODENOSCOPY (EGD) WITH PROPOFOL N/A 04/13/2017   Procedure: ESOPHAGOGASTRODUODENOSCOPY (EGD) WITH PROPOFOL;  Surgeon: Toledo, Benay Pike, MD;  Location: [facility] ENDOSCOPY;  Service: Endoscopy;  Laterality: N/A;  . ESOPHAGUS SURGERY    . EYE SURGERY     cataracts BIL  . HERNIA REPAIR    . HIATAL HERNIA REPAIR N/A   . POLYPECTOMY    .  TEE WITHOUT CARDIOVERSION N/A 01/19/2015   Procedure: TRANSESOPHAGEAL ECHOCARDIOGRAM (TEE);  Surgeon: Grace Isaac, MD;  Location: [place];  Service: Open Heart Surgery;  Laterality: N/A;    There were no vitals filed for this visit.  Subjective Assessment - 09/05/18 1306    Subjective  Pt. states B LE hurt last night after servicing lawn motor and being more active during the day.      Pertinent History  PMH CHF, CAD, s/p CABG, HLD, meningioma (stable followed by yearly MRIs), migraines. Pt also reports that his neurologist assesses him for Parkinson's but has not been diagnosed yet. Stated that he feels like he needs PT due to some  weakness he has in his legs, his balance, and to avoid falling.    Limitations  Standing;Walking;House hold activities    How long can you sit comfortably?  NA    How long can you stand comfortably?  5 mins    How long can you walk comfortably?  10-7mins with TM support    Patient Stated Goals  To improve balance, strengthening    Currently in Pain?  No/denies           Therapeutic Exercise  Scifit L6 10 min. B LE/LE (warm-up).   Nautilus: resisted walking 40# all planes of movement x 4 (difficulty with R lateral walking).  Moderate cuing to control step pattern/ foot clearance (heel strike/ toe off) Standing core ex.: Nautilus: 30# press-outs L/R x15/ walk outs L/R Standing Nautilus: 30# sh. Extension 15x.    Neuromuscular Re-education:  Airex: standing with blue ball trunk rotn/ sh. Flexion (cuing for upright posture/ mirror feedback).   Agility ladder walking with cone taps L/R (CGA for safety).  Agility ladder side stepping with cuing to increase hip flexion/ abduction L/R.     PT Education - 09/05/18 1356    Education Details  See HEP    Person(s) Educated  Patient    Methods  Explanation;Demonstration;Handout    Comprehension  Verbalized understanding;Returned demonstration       PT Short Term Goals - 08/28/18 1653      PT SHORT TERM GOAL #1   Title  Patient will be compliant with initial HEP in prep for self management of condition    Baseline  administered today    Time  4    Period  Weeks    Status  New    Target Date  09/25/18        PT Long Term Goals - 08/28/18 1653      PT LONG TERM GOAL #1   Title  Pt will be independent with finalized HEP/gym program in order to improve strength and balance in order to decrease fall risk and improve function at home and work.     Baseline  initial HEP administered at eval (08/28/2018)    Time  8    Period  Weeks    Status  New    Target Date  10/23/18      PT LONG TERM GOAL #2   Title  Pt will increase 6MWT  by at least 63m (149ft) in order to demonstrate clinically significant improvement in cardiopulmonary endurance and community ambulation     Baseline  63ft on eval (2/13)    Time  8    Period  Weeks    Status  New    Target Date  10/23/18      PT LONG TERM GOAL #3   Title  The patient will  report after 6MWT mild SOB/fatigue demonstrated improved activity tolerance/endurance with functional activities    Baseline  pt reported fatigue/SOB as 4/10 on eval (08/28/2018)    Time  8    Period  Weeks    Status  New    Target Date  10/23/18      PT LONG TERM GOAL #4   Title  The patient will exhibit proper gait mechanics including step length, upright posture and improved bilateral arm swing to promote normalized gait pattern.    Baseline  Pt ambulates with shuffling step, slouched posture, and decreased  arm swing. Able to momentarily correct with verbal cues    Time  8    Period  Weeks    Status  New    Target Date  10/23/18      PT LONG TERM GOAL #5   Title  The patient will demonstrate at least 1/2 MMT grade improvement to indicate improved functional strength and improve ability to perform functional activities.     Baseline  see objective measures (08/28/2018)    Time  8    Period  Weeks    Status  New    Target Date  10/23/18         Plan - 09/05/18 1308    Clinical Impression Statement  Pt. ambulates with limited hip flexion/ step pattern/ heel strike but able to correct for short distances with moderate verbal cuing.  Marked shuffling gait pattern with initial steps after sitting and intiating walking.  No LOB during tx. sessoin and very motivated during tx. session.  Pt. fatigued at end of tx. session with no reports of pain.      Clinical Presentation  Evolving    Clinical Decision Making  Moderate    Rehab Potential  Good    Clinical Impairments Affecting Rehab Potential  +motivation, sedentary lifestyle, nature of condition    PT Frequency  2x / week    PT Duration  8  weeks    PT Treatment/Interventions  ADLs/Self Care Home Management;Moist Heat;Electrical Stimulation;Gait training;Stair training;DME Instruction;Functional mobility training;Therapeutic activities;Therapeutic exercise;Balance training;Patient/family education;Neuromuscular re-education;Manual techniques;Energy conservation;Joint Manipulations;Spinal Manipulations    PT Next Visit Plan  gait training, balance training, strengthening,     PT Home Exercise Plan  see pt instruction section; sit to stands, standing hip extension, standing marching       Patient will benefit from skilled therapeutic intervention in order to improve the following deficits and impairments:  Abnormal gait, Decreased balance, Decreased endurance, Decreased mobility, Difficulty walking, Increased muscle spasms, Cardiopulmonary status limiting activity, Decreased range of motion, Improper body mechanics, Decreased activity tolerance, Decreased strength, Postural dysfunction  Visit Diagnosis: Muscle weakness (generalized)  Other abnormalities of gait and mobility     Problem List Patient Active Problem List   Diagnosis Date Noted  . Glaucoma (increased eye pressure) 12/19/2017  . DDD (degenerative disc disease), lumbosacral 12/05/2017  . Urinary frequency 06/21/2017  . Urinary urgency 06/21/2017  . Bilateral carotid artery stenosis 04/11/2017  . Atrial fibrillation ([place]) 01/30/2015  . 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 Stable angina ([place]) 01/07/2015  . Chronic diastolic CHF (congestive heart failure), NYHA class 2 ([place]) 12/16/2014  . Arteriosclerosis of coronary artery 12/10/2014  . GERD (gastroesophageal reflux disease) 06/16/2014  . Benign essential HTN 06/05/2014  . Mixed hyperlipidemia 06/05/2014  . Moderate mitral insufficiency 06/05/2014  . Chest pain 01/12/2014  . Angina pectoris ([place]) 10/09/2013  . 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 Esophageal  disease 10/09/2013  . Benign prostatic hyperplasia  with urinary obstruction 04/01/2012  . 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 ED (erectile dysfunction) of organic origin 04/01/2012  . Elevated prostate specific antigen (PSA) 04/01/2012  . Benign neoplasm of cerebral meninges ([facility]) 04/24/2011   Pura Spice, PT, DPT # [id number] 09/05/2018, 1:56 PM  [facility] [address]. [place], [place], [address] Phone: [phone]   Fax:  [phone]  Name: AZLAAN ISIDORE MRN: [id number] Date of Birth: 1941-03-15

## 2018-09-09 ENCOUNTER — Encounter: Payer: Self-pay | Admitting: Physical Therapy

## 2018-09-09 ENCOUNTER — Ambulatory Visit: Payer: Medicare Other | Admitting: Physical Therapy

## 2018-09-09 DIAGNOSIS — M6281 Muscle weakness (generalized): Secondary | ICD-10-CM

## 2018-09-09 DIAGNOSIS — R2689 Other abnormalities of gait and mobility: Secondary | ICD-10-CM

## 2018-09-09 NOTE — Therapy (Signed)
[facility] [address] [facility] [address] [place], [place], [address] Phone: [phone]   Fax:  [phone]  Physical Therapy Treatment  Patient Details  Name: Bradley [name] MRN: [id number] Date of Birth: 1940/09/27 Referring Provider (PT): Jennings Books   Encounter Date: 09/09/2018  PT End of Session - 09/09/18 1500    Visit Number  4    Number of Visits  16    Authorization Type  medicare    Authorization - Visit Number  4    Authorization - Number of Visits  10    PT Start Time  4562    PT Stop Time  1527    PT Time Calculation (min)  59 min    Equipment Utilized During Treatment  Gait belt    Activity Tolerance  Patient tolerated treatment well    Behavior During Therapy  [facility] for tasks assessed/performed       Past Medical History:  Diagnosis Date  . Allergy   . Barrett's esophagus   . Blockage of coronary artery of heart ([place])   . BPH (benign prostatic hyperplasia)   . Cataracts, bilateral   . Chronic diastolic CHF (congestive heart failure), NYHA class 2 ([place]) 12/16/2014  . Colon polyp   . Coronary artery disease   . DDD (degenerative disc disease), lumbosacral 12/05/2017  . Essential hypertension 06/05/2014  . GERD (gastroesophageal reflux disease)    Barretts esophagus  . GI bleed    a. ~2012 around time of hiatal hernia surgery. 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  . Glaucoma   . Hemorrhoids   . History of chicken pox   . History of hiatal hernia 2012  . Hyperlipidemia   . Meningioma ([place])    stable, followed by yearly MRIs  . Migraines   . RBBB   . Stomach ulcer     Past Surgical History:  Procedure Laterality Date  . ATHERECTOMY  1992  . CARDIAC CATHETERIZATION N/A 01/07/2015   Procedure: Left Heart Cath;  Surgeon: Corey Skains, MD;  Location: [place] [facility];  Service: Cardiovascular;  Laterality: N/A;  . CARDIAC SURGERY     coronary angioplasty  . CHOLECYSTECTOMY    .  COLONOSCOPY  11/03/08, 03/09/14  . CORONARY ANGIOPLASTY    . CORONARY ARTERY BYPASS GRAFT N/A 01/19/2015   Procedure: CORONARY ARTERY BYPASS GRAFTING (CABG), with LIM to LAD, vein to left intermediate, vein to PD, vein to OM, using right greater saphenous vein via endovein harvest.;  Surgeon: Grace Isaac, MD;  Location: [place];  Service: Open Heart Surgery;  Laterality: N/A;  . DIAGNOSTIC LAPAROSCOPY     cholecystectomy and hiatal hernia repair  . ENDOVEIN HARVEST OF GREATER SAPHENOUS VEIN Right 01/19/2015   Procedure: ENDOVEIN HARVEST OF GREATER SAPHENOUS VEIN;  Surgeon: Grace Isaac, MD;  Location: [place];  Service: Open Heart Surgery;  Laterality: Right;  . ESOPHAGOGASTRODUODENOSCOPY  02/19/13  . ESOPHAGOGASTRODUODENOSCOPY (EGD) WITH PROPOFOL N/A 08/09/2015   Procedure: ESOPHAGOGASTRODUODENOSCOPY (EGD) WITH PROPOFOL;  Surgeon: Hulen Luster, MD;  Location: [facility] ENDOSCOPY;  Service: Gastroenterology;  Laterality: N/A;  . ESOPHAGOGASTRODUODENOSCOPY (EGD) WITH PROPOFOL N/A 04/13/2017   Procedure: ESOPHAGOGASTRODUODENOSCOPY (EGD) WITH PROPOFOL;  Surgeon: Toledo, Benay Pike, MD;  Location: [facility] ENDOSCOPY;  Service: Endoscopy;  Laterality: N/A;  . ESOPHAGUS SURGERY    . EYE SURGERY     cataracts BIL  . HERNIA REPAIR    . HIATAL HERNIA REPAIR N/A   . POLYPECTOMY    .  TEE WITHOUT CARDIOVERSION N/A 01/19/2015   Procedure: TRANSESOPHAGEAL ECHOCARDIOGRAM (TEE);  Surgeon: Grace Isaac, MD;  Location: [place];  Service: Open Heart Surgery;  Laterality: N/A;    There were no vitals filed for this visit.  Subjective Assessment - 09/09/18 1457    Subjective  Pt. reports "soreness, tired" in L hip after last PT tx. sessoin.  Pt. reports feeling better right now.  No LOB at home and pt. reports limited walking over weekend.      Pertinent History  PMH CHF, CAD, s/p CABG, HLD, meningioma (stable followed by yearly MRIs), migraines. Pt also reports that his neurologist assesses him for Parkinson's but has not  been diagnosed yet. Stated that he feels like he needs PT due to some weakness he has in his legs, his balance, and to avoid falling.    Limitations  Standing;Walking;House hold activities    How long can you sit comfortably?  NA    How long can you stand comfortably?  5 mins    How long can you walk comfortably?  10-39mins with TM support    Patient Stated Goals  To improve balance, strengthening    Currently in Pain?  No/denies        There.ex.:  Resisted side stepping with emphasis on hip flexion/ high knee (posture correction)- Nautilus 4# 4x each.  High knee walking in //-bars 6 laps. Squats with overhead med ball press 2# 15x Resisted side stepping 2x4 with RTB  Resisted monster walks forward 4x with RTB Standing RTB shoulder extension with added hip flexion (mirror feedback for posture correction) 2x15.   Scifit L4 10 min. B UE/LE Discussed progressive walking program (issued handout).        PT Short Term Goals - 08/28/18 1653      PT SHORT TERM GOAL #1   Title  Patient will be compliant with initial HEP in prep for self management of condition    Baseline  administered today    Time  4    Period  Weeks    Status  New    Target Date  09/25/18        PT Long Term Goals - 08/28/18 1653      PT LONG TERM GOAL #1   Title  Pt will be independent with finalized HEP/gym program in order to improve strength and balance in order to decrease fall risk and improve function at home and work.     Baseline  initial HEP administered at eval (08/28/2018)    Time  8    Period  Weeks    Status  New    Target Date  10/23/18      PT LONG TERM GOAL #2   Title  Pt will increase 6MWT by at least 64m (176ft) in order to demonstrate clinically significant improvement in cardiopulmonary endurance and community ambulation     Baseline  671ft on eval (2/13)    Time  8    Period  Weeks    Status  New    Target Date  10/23/18      PT LONG TERM GOAL #3   Title  The patient will  report after 6MWT mild SOB/fatigue demonstrated improved activity tolerance/endurance with functional activities    Baseline  pt reported fatigue/SOB as 4/10 on eval (08/28/2018)    Time  8    Period  Weeks    Status  New    Target Date  10/23/18  PT LONG TERM GOAL #4   Title  The patient will exhibit proper gait mechanics including step length, upright posture and improved bilateral arm swing to promote normalized gait pattern.    Baseline  Pt ambulates with shuffling step, slouched posture, and decreased  arm swing. Able to momentarily correct with verbal cues    Time  8    Period  Weeks    Status  New    Target Date  10/23/18      PT LONG TERM GOAL #5   Title  The patient will demonstrate at least 1/2 MMT grade improvement to indicate improved functional strength and improve ability to perform functional activities.     Baseline  see objective measures (08/28/2018)    Time  8    Period  Weeks    Status  New    Target Date  10/23/18         Plan - 09/09/18 1500    Clinical Impression Statement  Pt. Entered clinic with shortened step length and forward head posture. Pt. Struggled with maintaining quality and safe step height with therex and resisted side stepping. PT provided mod verbal cueing during therapy to help pt. Maintain reciprocal gait pattern and maintaining safe ambulation. Pt. Demonstrated significant fatigue by the end of the treatment and needed occasional rest breaks. Pt. Will continue to benefit from skilled PT intervention to improve decreased step pattern and improve strength endurance.      Clinical Presentation  Evolving    Clinical Decision Making  Moderate    Rehab Potential  Good    Clinical Impairments Affecting Rehab Potential  +motivation, sedentary lifestyle, nature of condition    PT Frequency  2x / week    PT Duration  8 weeks    PT Treatment/Interventions  ADLs/Self Care Home Management;Moist Heat;Electrical Stimulation;Gait training;Stair  training;DME Instruction;Functional mobility training;Therapeutic activities;Therapeutic exercise;Balance training;Patient/family education;Neuromuscular re-education;Manual techniques;Energy conservation;Joint Manipulations;Spinal Manipulations    PT Next Visit Plan  gait training, balance training, strengthening,     PT Home Exercise Plan  see pt instruction section; sit to stands, standing hip extension, standing marching       Patient will benefit from skilled therapeutic intervention in order to improve the following deficits and impairments:  Abnormal gait, Decreased balance, Decreased endurance, Decreased mobility, Difficulty walking, Increased muscle spasms, Cardiopulmonary status limiting activity, Decreased range of motion, Improper body mechanics, Decreased activity tolerance, Decreased strength, Postural dysfunction  Visit Diagnosis: Muscle weakness (generalized)  Other abnormalities of gait and mobility     Problem List Patient Active Problem List   Diagnosis Date Noted  . Glaucoma (increased eye pressure) 12/19/2017  . DDD (degenerative disc disease), lumbosacral 12/05/2017  . Urinary frequency 06/21/2017  . Urinary urgency 06/21/2017  . Bilateral carotid artery stenosis 04/11/2017  . Atrial fibrillation ([place]) 01/30/2015  . 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 Stable angina ([place]) 01/07/2015  . Chronic diastolic CHF (congestive heart failure), NYHA class 2 ([place]) 12/16/2014  . Arteriosclerosis of coronary artery 12/10/2014  . GERD (gastroesophageal reflux disease) 06/16/2014  . Benign essential HTN 06/05/2014  . Mixed hyperlipidemia 06/05/2014  . Moderate mitral insufficiency 06/05/2014  . Chest pain 01/12/2014  . Angina pectoris ([place]) 10/09/2013  . 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 Esophageal disease 10/09/2013  . Benign prostatic hyperplasia with urinary obstruction 04/01/2012  . 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 ED (erectile dysfunction) of organic origin  04/01/2012  . Elevated prostate specific antigen (PSA) 04/01/2012  .  Benign neoplasm of cerebral meninges ([place]) 04/24/2011   Pura Spice, PT, DPT # [id number] Florentina Jenny, SPT 09/10/2018, 5:59 PM  [facility] [address] [place], [place], [address] Phone: [phone]   Fax:  [phone]  Name: KAIMANI CLAYSON MRN: [id number] Date of Birth: 03/23/1941

## 2018-09-11 ENCOUNTER — Encounter: Payer: Self-pay | Admitting: Physical Therapy

## 2018-09-11 ENCOUNTER — Ambulatory Visit: Payer: Medicare Other | Admitting: Physical Therapy

## 2018-09-11 DIAGNOSIS — M6281 Muscle weakness (generalized): Secondary | ICD-10-CM

## 2018-09-11 DIAGNOSIS — R2689 Other abnormalities of gait and mobility: Secondary | ICD-10-CM | POA: Diagnosis not present

## 2018-09-11 NOTE — Therapy (Signed)
[facility] [address] [place], [place], [address] Phone: [phone]   Fax:  [phone]  Physical Therapy Treatment  Patient Details  Name: Bradley [name] MRN: [id number] Date of Birth: 25-May-1941 Referring Provider (PT): Jennings Books   Encounter Date: 09/11/2018  PT End of Session - 09/12/18 0830    Visit Number  5    Number of Visits  16    Authorization Type  medicare    Authorization - Visit Number  5    Authorization - Number of Visits  10    PT Start Time  4034    PT Stop Time  1119    PT Time Calculation (min)  50 min    Equipment Utilized During Treatment  Gait belt    Activity Tolerance  Patient tolerated treatment well    Behavior During Therapy  [facility] for tasks assessed/performed       Past Medical History:  Diagnosis Date  . Allergy   . Barrett's esophagus   . Blockage of coronary artery of heart ([place])   . BPH (benign prostatic hyperplasia)   . Cataracts, bilateral   . Chronic diastolic CHF (congestive heart failure), NYHA class 2 ([place]) 12/16/2014  . Colon polyp   . Coronary artery disease   . DDD (degenerative disc disease), lumbosacral 12/05/2017  . Essential hypertension 06/05/2014  . GERD (gastroesophageal reflux disease)    Barretts esophagus  . GI bleed    a. ~2012 around time of hiatal hernia surgery. 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  . Glaucoma   . Hemorrhoids   . History of chicken pox   . History of hiatal hernia 2012  . Hyperlipidemia   . Meningioma ([place])    stable, followed by yearly MRIs  . Migraines   . RBBB   . Stomach ulcer     Past Surgical History:  Procedure Laterality Date  . ATHERECTOMY  1992  . CARDIAC CATHETERIZATION N/A 01/07/2015   Procedure: Left Heart Cath;  Surgeon: Corey Skains, MD;  Location: [facility];  Service: Cardiovascular;  Laterality: N/A;  . CARDIAC SURGERY     coronary angioplasty  . CHOLECYSTECTOMY    .  COLONOSCOPY  11/03/08, 03/09/14  . CORONARY ANGIOPLASTY    . CORONARY ARTERY BYPASS GRAFT N/A 01/19/2015   Procedure: CORONARY ARTERY BYPASS GRAFTING (CABG), with LIM to LAD, vein to left intermediate, vein to PD, vein to OM, using right greater saphenous vein via endovein harvest.;  Surgeon: Grace Isaac, MD;  Location: [facility];  Service: Open Heart Surgery;  Laterality: N/A;  . DIAGNOSTIC LAPAROSCOPY     cholecystectomy and hiatal hernia repair  . ENDOVEIN HARVEST OF GREATER SAPHENOUS VEIN Right 01/19/2015   Procedure: ENDOVEIN HARVEST OF GREATER SAPHENOUS VEIN;  Surgeon: Grace Isaac, MD;  Location: [facility];  Service: Open Heart Surgery;  Laterality: Right;  . ESOPHAGOGASTRODUODENOSCOPY  02/19/13  . ESOPHAGOGASTRODUODENOSCOPY (EGD) WITH PROPOFOL N/A 08/09/2015   Procedure: ESOPHAGOGASTRODUODENOSCOPY (EGD) WITH PROPOFOL;  Surgeon: Hulen Luster, MD;  Location: [facility];  Service: Gastroenterology;  Laterality: N/A;  . ESOPHAGOGASTRODUODENOSCOPY (EGD) WITH PROPOFOL N/A 04/13/2017   Procedure: ESOPHAGOGASTRODUODENOSCOPY (EGD) WITH PROPOFOL;  Surgeon: Toledo, Benay Pike, MD;  Location: [facility] ENDOSCOPY;  Service: Endoscopy;  Laterality: N/A;  . ESOPHAGUS SURGERY    . EYE SURGERY     cataracts BIL  . HERNIA REPAIR    . HIATAL HERNIA REPAIR N/A   . POLYPECTOMY    .  TEE WITHOUT CARDIOVERSION N/A 01/19/2015   Procedure: TRANSESOPHAGEAL ECHOCARDIOGRAM (TEE);  Surgeon: Grace Isaac, MD;  Location: [facility];  Service: Open Heart Surgery;  Laterality: N/A;    There were no vitals filed for this visit.  Subjective Assessment - 09/12/18 0829    Subjective  Pt. entered clinic today with complaints of 3/10 pain on NPS in bilat hips. Pt. reported he has been feeling muscle fatigued lately with all the therapy and exercise/activity he has been doing.     Pertinent History  PMH CHF, CAD, s/p CABG, HLD, meningioma (stable followed by yearly MRIs), migraines. Pt also reports that his neurologist assesses him for  Parkinson's but has not been diagnosed yet. Stated that he feels like he needs PT due to some weakness he has in his legs, his balance, and to avoid falling.    Limitations  Standing;Walking;House hold activities    How long can you sit comfortably?  NA    How long can you stand comfortably?  5 mins    How long can you walk comfortably?  10-12mins with TM support    Patient Stated Goals  To improve balance, strengthening    Currently in Pain?  Yes    Pain Score  3     Pain Location  Hip    Pain Orientation  Left;Right       Therex:  Sci fit 5 min forward/5 min backward with bilat UE/LE TG squats 2x15 (PT provide min verbal cueing for pt. to maintain proper knee alignment over toes) TG calf raises 2x15 (PT provided tactile feedback to keep knees straight)  Bilat sanding hip abduction x15 (PT provided min verbal cueing to maintain upright posture) Bilat side step hip hikes off 6 in step 2x12 (PT provided mod verbal and tactile cueing for pt. to achieve motion and hip abductor activation)  Nautilus Bilat standing shoulder extension with 40 lbs 2x15 (PT provided mod verbal feedback for pt. To activate core)       PT Short Term Goals - 08/28/18 1653      PT SHORT TERM GOAL #1   Title  Patient will be compliant with initial HEP in prep for self management of condition    Baseline  administered today    Time  4    Period  Weeks    Status  New    Target Date  09/25/18        PT Long Term Goals - 08/28/18 1653      PT LONG TERM GOAL #1   Title  Pt will be independent with finalized HEP/gym program in order to improve strength and balance in order to decrease fall risk and improve function at home and work.     Baseline  initial HEP administered at eval (08/28/2018)    Time  8    Period  Weeks    Status  New    Target Date  10/23/18      PT LONG TERM GOAL #2   Title  Pt will increase 6MWT by at least 89m (163ft) in order to demonstrate clinically significant improvement in  cardiopulmonary endurance and community ambulation     Baseline  664ft on eval (2/13)    Time  8    Period  Weeks    Status  New    Target Date  10/23/18      PT LONG TERM GOAL #3   Title  The patient will report after 6MWT mild SOB/fatigue demonstrated improved  activity tolerance/endurance with functional activities    Baseline  pt reported fatigue/SOB as 4/10 on eval (08/28/2018)    Time  8    Period  Weeks    Status  New    Target Date  10/23/18      PT LONG TERM GOAL #4   Title  The patient will exhibit proper gait mechanics including step length, upright posture and improved bilateral arm swing to promote normalized gait pattern.    Baseline  Pt ambulates with shuffling step, slouched posture, and decreased  arm swing. Able to momentarily correct with verbal cues    Time  8    Period  Weeks    Status  New    Target Date  10/23/18      PT LONG TERM GOAL #5   Title  The patient will demonstrate at least 1/2 MMT grade improvement to indicate improved functional strength and improve ability to perform functional activities.     Baseline  see objective measures (08/28/2018)    Time  8    Period  Weeks    Status  New    Target Date  10/23/18            Plan - 09/12/18 0830    Clinical Impression Statement  Pt. presented to clinic with shortened stride and forward head posture today. Pt. noted he has continued to feel fatigued during the day and with activity. Pt. required frequent rest breaks between nearly every exercises to recover. PT provided mod verbal feed back during the entirety of the session for pt. to maintain upright posture and step higher with ambulation. Pt. educated on possibly participating in [employer] sneakers/gym to use recumbent bike to continue strengthening without fatiguing him like walking at home as been doing. Pt. also educated to break up walking/HEP throughout the day to give him more time to recover but still progress his strengthening/endurance. Pt.  will continue to benefit from skilled PT intervention to improve LE muscle endurance to decrease fall risk and return to participating in yard work etc without needing to take breaks.     Clinical Presentation  Evolving    Clinical Decision Making  Moderate    Rehab Potential  Good    Clinical Impairments Affecting Rehab Potential  +motivation, sedentary lifestyle, nature of condition    PT Frequency  2x / week    PT Duration  8 weeks    PT Treatment/Interventions  ADLs/Self Care Home Management;Moist Heat;Electrical Stimulation;Gait training;Stair training;DME Instruction;Functional mobility training;Therapeutic activities;Therapeutic exercise;Balance training;Patient/family education;Neuromuscular re-education;Manual techniques;Energy conservation;Joint Manipulations;Spinal Manipulations    PT Next Visit Plan  gait training, balance training, strengthening,     PT Home Exercise Plan  see pt instruction section; sit to stands, standing hip extension, standing marching    Consulted and Agree with Plan of Care  Patient       Patient will benefit from skilled therapeutic intervention in order to improve the following deficits and impairments:  Abnormal gait, Decreased balance, Decreased endurance, Decreased mobility, Difficulty walking, Increased muscle spasms, Cardiopulmonary status limiting activity, Decreased range of motion, Improper body mechanics, Decreased activity tolerance, Decreased strength, Postural dysfunction  Visit Diagnosis: Muscle weakness (generalized)  Other abnormalities of gait and mobility     Problem List Patient Active Problem List   Diagnosis Date Noted  . Glaucoma (increased eye pressure) 12/19/2017  . DDD (degenerative disc disease), lumbosacral 12/05/2017  . Urinary frequency 06/21/2017  . Urinary urgency 06/21/2017  . Bilateral carotid artery stenosis  04/11/2017  . Atrial fibrillation ([place]) 01/30/2015  . 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 Stable angina ([place])  01/07/2015  . Chronic diastolic CHF (congestive heart failure), NYHA class 2 ([place]) 12/16/2014  . Arteriosclerosis of coronary artery 12/10/2014  . GERD (gastroesophageal reflux disease) 06/16/2014  . Benign essential HTN 06/05/2014  . Mixed hyperlipidemia 06/05/2014  . Moderate mitral insufficiency 06/05/2014  . Chest pain 01/12/2014  . Angina pectoris ([place]) 10/09/2013  . 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 Esophageal disease 10/09/2013  . Benign prostatic hyperplasia with urinary obstruction 04/01/2012  . 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 ED (erectile dysfunction) of organic origin 04/01/2012  . Elevated prostate specific antigen (PSA) 04/01/2012  . Benign neoplasm of cerebral meninges ([facility]) 04/24/2011   Pura Spice, PT, DPT # [id number] Florentina Jenny, SPT 09/12/2018, 8:39 AM  [facility] [address] [place], [place], [address] Phone: [phone]   Fax:  [phone]  Name: Bradley [name] MRN: [id number] Date of Birth: 1940-09-16

## 2018-09-19 ENCOUNTER — Ambulatory Visit: Payer: Medicare Other | Attending: Neurology | Admitting: Physical Therapy

## 2018-09-19 ENCOUNTER — Encounter: Payer: Self-pay | Admitting: Physical Therapy

## 2018-09-19 DIAGNOSIS — R2689 Other abnormalities of gait and mobility: Secondary | ICD-10-CM | POA: Insufficient documentation

## 2018-09-19 DIAGNOSIS — M6281 Muscle weakness (generalized): Secondary | ICD-10-CM | POA: Insufficient documentation

## 2018-09-19 NOTE — Therapy (Addendum)
[facility] [address] [place], [place], [address] Phone: [phone]   Fax:  [phone]  Physical Therapy Treatment  Patient Details  Name: Bradley [name] MRN: [id number] Date of Birth: Sep 05, 1940 Referring Provider (PT): Jennings Books   Encounter Date: 09/19/2018       Past Medical History:  Diagnosis Date  . Allergy   . Barrett's esophagus   . Blockage of coronary artery of heart ([place])   . BPH (benign prostatic hyperplasia)   . Cataracts, bilateral   . Chronic diastolic CHF (congestive heart failure), NYHA class 2 ([place]) 12/16/2014  . Colon polyp   . Coronary artery disease   . DDD (degenerative disc disease), lumbosacral 12/05/2017  . Essential hypertension 06/05/2014  . GERD (gastroesophageal reflux disease)    Barretts esophagus  . GI bleed    a. ~2012 around time of hiatal hernia surgery. 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  . Glaucoma   . Hemorrhoids   . History of chicken pox   . History of hiatal hernia 2012  . Hyperlipidemia   . Meningioma ([place])    stable, followed by yearly MRIs  . Migraines   . RBBB   . Stomach ulcer     Past Surgical History:  Procedure Laterality Date  . ATHERECTOMY  1992  . CARDIAC CATHETERIZATION N/A 01/07/2015   Procedure: Left Heart Cath;  Surgeon: Corey Skains, MD;  Location: [facility];  Service: Cardiovascular;  Laterality: N/A;  . CARDIAC SURGERY     coronary angioplasty  . CHOLECYSTECTOMY    . COLONOSCOPY  11/03/08, 03/09/14  . CORONARY ANGIOPLASTY    . CORONARY ARTERY BYPASS GRAFT N/A 01/19/2015   Procedure: CORONARY ARTERY BYPASS GRAFTING (CABG), with LIM to LAD, vein to left intermediate, vein to PD, vein to OM, using right greater saphenous vein via endovein harvest.;  Surgeon: Grace Isaac, MD;  Location: [facility];  Service: Open Heart Surgery;  Laterality: N/A;  . DIAGNOSTIC LAPAROSCOPY     cholecystectomy and hiatal hernia repair   . ENDOVEIN HARVEST OF GREATER SAPHENOUS VEIN Right 01/19/2015   Procedure: ENDOVEIN HARVEST OF GREATER SAPHENOUS VEIN;  Surgeon: Grace Isaac, MD;  Location: [facility];  Service: Open Heart Surgery;  Laterality: Right;  . ESOPHAGOGASTRODUODENOSCOPY  02/19/13  . ESOPHAGOGASTRODUODENOSCOPY (EGD) WITH PROPOFOL N/A 08/09/2015   Procedure: ESOPHAGOGASTRODUODENOSCOPY (EGD) WITH PROPOFOL;  Surgeon: Hulen Luster, MD;  Location[facility]COPY;  Service: Gastroenterology;  Laterality: N/A;  . ESOPHAGOGASTRODUODENOSCOPY (EGD) WITH PROPOFOL N/A 04/13/2017   Procedure: ESOPHAGOGASTRODUODENOSCOPY (EGD) WITH PROPOFOL;  Surgeon: Toledo, Benay Pike, MD;  Location: [facility] ENDOSCOPY;  Service: Endoscopy;  Laterality: N/A;  . ESOPHAGUS SURGERY    . EYE SURGERY     cataracts BIL  . HERNIA REPAIR    . HIATAL HERNIA REPAIR N/A   . POLYPECTOMY    . TEE WITHOUT CARDIOVERSION N/A 01/19/2015   Procedure: TRANSESOPHAGEAL ECHOCARDIOGRAM (TEE);  Surgeon: Grace Isaac, MD;  Location: [facility];  Service: Open Heart Surgery;  Laterality: N/A;    There were no vitals filed for this visit.    Therapeutic exercise  Sci Fit L:4 5 min forward TG squats 2x15  STS 2x10 Band walking forward, backward, lateral 2 laps with green theraband Heel raises and toe raises x20 Review of HEP with addition of squats and hip abduction x10 each Seated leanbacks x10 Seated leanbacks with marching x10  Neuromuscular Rehabilitation  lateral cone walking 2 laps  Forward walking with exaggerated step 2 laps Education regarding mindfulness with ambulation and correction of gait mechanics x5 min                          PT Short Term Goals - 08/28/18 1653      PT SHORT TERM GOAL #1   Title  Patient will be compliant with initial HEP in prep for self management of condition    Baseline  administered today    Time  4    Period  Weeks    Status  New    Target Date  09/25/18        PT Long Term Goals - 08/28/18 1653       PT LONG TERM GOAL #1   Title  Pt will be independent with finalized HEP/gym program in order to improve strength and balance in order to decrease fall risk and improve function at home and work.     Baseline  initial HEP administered at eval (08/28/2018)    Time  8    Period  Weeks    Status  New    Target Date  10/23/18      PT LONG TERM GOAL #2   Title  Pt will increase 6MWT by at least 45m (133ft) in order to demonstrate clinically significant improvement in cardiopulmonary endurance and community ambulation     Baseline  637ft on eval (2/13)    Time  8    Period  Weeks    Status  New    Target Date  10/23/18      PT LONG TERM GOAL #3   Title  The patient will report after 6MWT mild SOB/fatigue demonstrated improved activity tolerance/endurance with functional activities    Baseline  pt reported fatigue/SOB as 4/10 on eval (08/28/2018)    Time  8    Period  Weeks    Status  New    Target Date  10/23/18      PT LONG TERM GOAL #4   Title  The patient will exhibit proper gait mechanics including step length, upright posture and improved bilateral arm swing to promote normalized gait pattern.    Baseline  Pt ambulates with shuffling step, slouched posture, and decreased  arm swing. Able to momentarily correct with verbal cues    Time  8    Period  Weeks    Status  New    Target Date  10/23/18      PT LONG TERM GOAL #5   Title  The patient will demonstrate at least 1/2 MMT grade improvement to indicate improved functional strength and improve ability to perform functional activities.     Baseline  see objective measures (08/28/2018)    Time  8    Period  Weeks    Status  New    Target Date  10/23/18              Patient will benefit from skilled therapeutic intervention in order to improve the following deficits and impairments:  Abnormal gait, Decreased balance, Decreased endurance, Decreased mobility, Difficulty walking, Increased muscle spasms, Cardiopulmonary  status limiting activity, Decreased range of motion, Improper body mechanics, Decreased activity tolerance, Decreased strength, Postural dysfunction  Visit Diagnosis: Muscle weakness (generalized)  Other abnormalities of gait and mobility     Problem List Patient Active Problem List   Diagnosis Date Noted  . Glaucoma (increased eye pressure) 12/19/2017  . DDD (degenerative  disc disease), lumbosacral 12/05/2017  . Urinary frequency 06/21/2017  . Urinary urgency 06/21/2017  . Bilateral carotid artery stenosis 04/11/2017  . Atrial fibrillation ([place]) 01/30/2015  . 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 Stable angina ([place]) 01/07/2015  . Chronic diastolic CHF (congestive heart failure), NYHA class 2 ([place]) 12/16/2014  . Arteriosclerosis of coronary artery 12/10/2014  . GERD (gastroesophageal reflux disease) 06/16/2014  . Benign essential HTN 06/05/2014  . Mixed hyperlipidemia 06/05/2014  . Moderate mitral insufficiency 06/05/2014  . Chest pain 01/12/2014  . Angina pectoris ([place]) 10/09/2013  . 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 Esophageal disease 10/09/2013  . Benign prostatic hyperplasia with urinary obstruction 04/01/2012  . 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 ED (erectile dysfunction) of organic origin 04/01/2012  . Elevated prostate specific antigen (PSA) 04/01/2012  . Benign neoplasm of cerebral meninges ([place]) 04/24/2011   Roxanne Gates, PT, DPT  Roxanne Gates 10/03/2018, 9:39 AM  [facility] [address] [place], [place], [address] Phone: [phone]   Fax:  [phone]  Name: Bradley [name] MRN: [id number] Date of Birth: 10-Jan-1941

## 2018-09-26 ENCOUNTER — Ambulatory Visit: Payer: Medicare Other | Admitting: Physical Therapy

## 2018-09-26 ENCOUNTER — Other Ambulatory Visit: Payer: Self-pay

## 2018-09-26 ENCOUNTER — Encounter: Payer: Self-pay | Admitting: Physical Therapy

## 2018-09-26 DIAGNOSIS — M6281 Muscle weakness (generalized): Secondary | ICD-10-CM

## 2018-09-26 DIAGNOSIS — R2689 Other abnormalities of gait and mobility: Secondary | ICD-10-CM

## 2018-09-26 NOTE — Therapy (Signed)
[facility] [address]. [place], [place], [address] Phone: [phone]   Fax:  [phone]  Physical Therapy Treatment  Patient Details  Name: Bradley [name] MRN: [id number] Date of Birth: [birth year] Referring Provider (PT): Jennings Books   Encounter Date: 09/26/2018  PT End of Session - 09/26/18 1243    Visit Number  7    Number of Visits  16    Date for PT Re-Evaluation  10/23/18    Authorization Type  medicare    Authorization - Visit Number  7    Authorization - Number of Visits  10    Equipment Utilized During Treatment  Gait belt    Activity Tolerance  Patient tolerated treatment well    Behavior During Therapy  [facility] for tasks assessed/performed       Past Medical History:  Diagnosis Date  . Allergy   . Barrett's esophagus   . Blockage of coronary artery of heart ([place])   . BPH (benign prostatic hyperplasia)   . Cataracts, bilateral   . Chronic diastolic CHF (congestive heart failure), NYHA class 2 ([place]) 12/16/2014  . Colon polyp   . Coronary artery disease   . DDD (degenerative disc disease), lumbosacral 12/05/2017  . Essential hypertension 06/05/2014  . GERD (gastroesophageal reflux disease)    Barretts esophagus  . GI bleed    a. ~2012 around time of hiatal hernia surgery. 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  . Glaucoma   . Hemorrhoids   . History of chicken pox   . History of hiatal hernia 2012  . Hyperlipidemia   . Meningioma ([place])    stable, followed by yearly MRIs  . Migraines   . RBBB   . Stomach ulcer     Past Surgical History:  Procedure Laterality Date  . ATHERECTOMY  1992  . CARDIAC CATHETERIZATION N/A 01/07/2015   Procedure: Left Heart Cath;  Surgeon: Corey Skains, MD;  Location: [place] [facility];  Service: Cardiovascular;  Laterality: N/A;  . CARDIAC SURGERY     coronary angioplasty  . CHOLECYSTECTOMY    . COLONOSCOPY  11/03/08, 03/09/14  . CORONARY ANGIOPLASTY     . CORONARY ARTERY BYPASS GRAFT N/A 01/19/2015   Procedure: CORONARY ARTERY BYPASS GRAFTING (CABG), with LIM to LAD, vein to left intermediate, vein to PD, vein to OM, using right greater saphenous vein via endovein harvest.;  Surgeon: Grace Isaac, MD;  Location: [place];  Service: Open Heart Surgery;  Laterality: N/A;  . DIAGNOSTIC LAPAROSCOPY     cholecystectomy and hiatal hernia repair  . ENDOVEIN HARVEST OF GREATER SAPHENOUS VEIN Right 01/19/2015   Procedure: ENDOVEIN HARVEST OF GREATER SAPHENOUS VEIN;  Surgeon: Grace Isaac, MD;  Location: [place];  Service: Open Heart Surgery;  Laterality: Right;  . ESOPHAGOGASTRODUODENOSCOPY  02/19/13  . ESOPHAGOGASTRODUODENOSCOPY (EGD) WITH PROPOFOL N/A 08/09/2015   Procedure: ESOPHAGOGASTRODUODENOSCOPY (EGD) WITH PROPOFOL;  Surgeon: Hulen Luster, MD;  Location: [facility] END[facility]COPY;  Service: Gastroenterology;  Laterality: N/A;  . ESOPHAGOGASTRODUODENOSCOPY (EGD) WITH PROPOFOL N/A 04/13/2017   Procedure: ESOPHAGOGASTRODUODENOSCOPY (EGD) WITH PROPOFOL;  Surgeon: Toledo, Benay Pike, MD;  Location: [facility] ENDOSCOPY;  Service: Endoscopy;  Laterality: N/A;  . ESOPHAGUS SURGERY    . EYE SURGERY     cataracts BIL  . HERNIA REPAIR    . HIATAL HERNIA REPAIR N/A   . POLYPECTOMY    . TEE WITHOUT CARDIOVERSION N/A 01/19/2015   Procedure: TRANSESOPHAGEAL ECHOCARDIOGRAM (TEE);  Surgeon: Percell Miller  Maryruth Bun, MD;  Location: [place];  Service: Open Heart Surgery;  Laterality: N/A;    There were no vitals filed for this visit.  Subjective Assessment - 09/26/18 1241    Subjective  Pt felt tired and a "little sore" following therapy last time.  HEP is going "pretty good".  He's doing 10 min 2x/day on TM.      Pertinent History  PMH CHF, CAD, s/p CABG, HLD, meningioma (stable followed by yearly MRIs), migraines. Pt also reports that his neurologist assesses him for Parkinson's but has not been diagnosed yet. Stated that he feels like he needs PT due to some weakness he has in his legs,  his balance, and to avoid falling.    Limitations  Standing;Walking;House hold activities    How long can you sit comfortably?  NA    How long can you stand comfortably?  5 mins    How long can you walk comfortably?  10-45mins with TM support    Patient Stated Goals  To improve balance, strengthening    Pain Score  0-No pain         Therapeutic Exercise  Sci Fit: L: 6, 8 min Seated leanbacks 2x10 Seated leanbacks with trunk rotation x10 Standing march with RTB around feet x20 min UE support on // bars Band walking forward/lateral/retro x2 laps with RTB and min UE support on bars Bridge x10 Bridge with RTB/hip abduction x10 with VC's for glut contraction  Neuromuscular Re-education //bar walking with exaggerated foot clearance x3 laps //bar walking with Vc's to contract gluteals and abdominal with visibly improvement in posture and stability without need for UE support                     PT Education - 09/26/18 1242    Education Details  Importance of HEP, benefit of abdominal and glut strength to balance.    Person(s) Educated  Patient    Methods  Explanation;Demonstration    Comprehension  Verbalized understanding       PT Short Term Goals - 08/28/18 1653      PT SHORT TERM GOAL #1   Title  Patient will be compliant with initial HEP in prep for self management of condition    Baseline  administered today    Time  4    Period  Weeks    Status  New    Target Date  09/25/18        PT Long Term Goals - 08/28/18 1653      PT LONG TERM GOAL #1   Title  Pt will be independent with finalized HEP/gym program in order to improve strength and balance in order to decrease fall risk and improve function at home and work.     Baseline  initial HEP administered at eval (08/28/2018)    Time  8    Period  Weeks    Status  New    Target Date  10/23/18      PT LONG TERM GOAL #2   Title  Pt will increase 6MWT by at least 21m (13ft) in order to demonstrate  clinically significant improvement in cardiopulmonary endurance and community ambulation     Baseline  673ft on eval (2/13)    Time  8    Period  Weeks    Status  New    Target Date  10/23/18      PT LONG TERM GOAL #3   Title  The patient will  report after 6MWT mild SOB/fatigue demonstrated improved activity tolerance/endurance with functional activities    Baseline  pt reported fatigue/SOB as 4/10 on eval (08/28/2018)    Time  8    Period  Weeks    Status  New    Target Date  10/23/18      PT LONG TERM GOAL #4   Title  The patient will exhibit proper gait mechanics including step length, upright posture and improved bilateral arm swing to promote normalized gait pattern.    Baseline  Pt ambulates with shuffling step, slouched posture, and decreased  arm swing. Able to momentarily correct with verbal cues    Time  8    Period  Weeks    Status  New    Target Date  10/23/18      PT LONG TERM GOAL #5   Title  The patient will demonstrate at least 1/2 MMT grade improvement to indicate improved functional strength and improve ability to perform functional activities.     Baseline  see objective measures (08/28/2018)    Time  8    Period  Weeks    Status  New    Target Date  10/23/18              Patient will benefit from skilled therapeutic intervention in order to improve the following deficits and impairments:     Visit Diagnosis: Muscle weakness (generalized)  Other abnormalities of gait and mobility     Problem List Patient Active Problem List   Diagnosis Date Noted  . Glaucoma (increased eye pressure) 12/19/2017  . DDD (degenerative disc disease), lumbosacral 12/05/2017  . Urinary frequency 06/21/2017  . Urinary urgency 06/21/2017  . Bilateral carotid artery stenosis 04/11/2017  . Atrial fibrillation ([place]) 01/30/2015  . 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 Stable angina ([place]) 01/07/2015  . Chronic diastolic CHF (congestive heart failure), NYHA class 2 ([place]) 12/16/2014   . Arteriosclerosis of coronary artery 12/10/2014  . GERD (gastroesophageal reflux disease) 06/16/2014  . Benign essential HTN 06/05/2014  . Mixed hyperlipidemia 06/05/2014  . Moderate mitral insufficiency 06/05/2014  . Chest pain 01/12/2014  . Angina pectoris ([place]) 10/09/2013  . 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 Esophageal disease 10/09/2013  . Benign prostatic hyperplasia with urinary obstruction 04/01/2012  . 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 ED (erectile dysfunction) of organic origin 04/01/2012  . Elevated prostate specific antigen (PSA) 04/01/2012  . Benign neoplasm of cerebral meninges ([place]) 04/24/2011   Roxanne Gates, PT, DPT  Roxanne Gates 09/26/2018, 12:48 PM  [facility] [address]. [place], [place], [address] Phone: [phone]   Fax:  [phone]  Name: Bradley [name] MRN: [id number] Date of Birth: Mar 13, 1941

## 2018-10-02 ENCOUNTER — Ambulatory Visit: Payer: Medicare Other | Admitting: Physical Therapy

## 2018-10-02 ENCOUNTER — Encounter: Payer: Self-pay | Admitting: Physical Therapy

## 2018-10-02 ENCOUNTER — Other Ambulatory Visit: Payer: Self-pay

## 2018-10-02 DIAGNOSIS — M6281 Muscle weakness (generalized): Secondary | ICD-10-CM

## 2018-10-02 DIAGNOSIS — R2689 Other abnormalities of gait and mobility: Secondary | ICD-10-CM

## 2018-10-02 NOTE — Therapy (Signed)
[facility] [address]. [place], [place], [address] Phone: [phone]   Fax:  [phone]  Physical Therapy Treatment  Patient Details  Name: Bradley [name] MRN: [id number] Date of Birth: 1941-04-27 Referring Provider (PT): Jennings Books   Encounter Date: 10/02/2018  PT End of Session - 10/02/18 1254    Visit Number  8    Number of Visits  16    Date for PT Re-Evaluation  10/23/18    Authorization - Visit Number  8    Authorization - Number of Visits  10    PT Start Time  4818    PT Stop Time  1345    PT Time Calculation (min)  57 min    Equipment Utilized During Treatment  Gait belt    Activity Tolerance  Patient tolerated treatment well    Behavior During Therapy  [facility] for tasks assessed/performed       Past Medical History:  Diagnosis Date  . Allergy   . Barrett's esophagus   . Blockage of coronary artery of heart ([place])   . BPH (benign prostatic hyperplasia)   . Cataracts, bilateral   . Chronic diastolic CHF (congestive heart failure), NYHA class 2 ([place]) 12/16/2014  . Colon polyp   . Coronary artery disease   . DDD (degenerative disc disease), lumbosacral 12/05/2017  . Essential hypertension 06/05/2014  . GERD (gastroesophageal reflux disease)    Barretts esophagus  . GI bleed    a. ~2012 around time of hiatal hernia surgery. 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  . Glaucoma   . Hemorrhoids   . History of chicken pox   . History of hiatal hernia 2012  . Hyperlipidemia   . Meningioma ([place])    stable, followed by yearly MRIs  . Migraines   . RBBB   . Stomach ulcer     Past Surgical History:  Procedure Laterality Date  . ATHERECTOMY  1992  . CARDIAC CATHETERIZATION N/A 01/07/2015   Procedure: Left Heart Cath;  Surgeon: Corey Skains, MD;  Location: [place] [facility];  Service: Cardiovascular;  Laterality: N/A;  . CARDIAC SURGERY     coronary angioplasty  . CHOLECYSTECTOMY    .  COLONOSCOPY  11/03/08, 03/09/14  . CORONARY ANGIOPLASTY    . CORONARY ARTERY BYPASS GRAFT N/A 01/19/2015   Procedure: CORONARY ARTERY BYPASS GRAFTING (CABG), with LIM to LAD, vein to left intermediate, vein to PD, vein to OM, using right greater saphenous vein via endovein harvest.;  Surgeon: Grace Isaac, MD;  Location: [place];  Service: Open Heart Surgery;  Laterality: N/A;  . DIAGNOSTIC LAPAROSCOPY     cholecystectomy and hiatal hernia repair  . ENDOVEIN HARVEST OF GREATER SAPHENOUS VEIN Right 01/19/2015   Procedure: ENDOVEIN HARVEST OF GREATER SAPHENOUS VEIN;  Surgeon: Grace Isaac, MD;  Location: [place];  Service: Open Heart Surgery;  Laterality: Right;  . ESOPHAGOGASTRODUODENOSCOPY  02/19/13  . ESOPHAGOGASTRODUODENOSCOPY (EGD) WITH PROPOFOL N/A 08/09/2015   Procedure: ESOPHAGOGASTRODUODENOSCOPY (EGD) WITH PROPOFOL;  Surgeon: Hulen Luster, MD;  Location: [facility] ENDOSCOPY;  Service: Gastroenterology;  Laterality: N/A;  . ESOPHAGOGASTRODUODENOSCOPY (EGD) WITH PROPOFOL N/A 04/13/2017   Procedure: ESOPHAGOGASTRODUODENOSCOPY (EGD) WITH PROPOFOL;  Surgeon: Toledo, Benay Pike, MD;  Location: [facility] ENDOSCOPY;  Service: Endoscopy;  Laterality: N/A;  . ESOPHAGUS SURGERY    . EYE SURGERY     cataracts BIL  . HERNIA REPAIR    . HIATAL HERNIA REPAIR N/A   .  POLYPECTOMY    . TEE WITHOUT CARDIOVERSION N/A 01/19/2015   Procedure: TRANSESOPHAGEAL ECHOCARDIOGRAM (TEE);  Surgeon: Grace Isaac, MD;  Location: [place];  Service: Open Heart Surgery;  Laterality: N/A;    There were no vitals filed for this visit.  Subjective Assessment - 10/02/18 1250    Subjective  Pt. states he is doing okay today.  Pt. walked on TM yesterday for 14 min. (fatigue).  L anterior ankle discomfort over past few days (no cause for pain).  No L ankle pain on TM yesterday or prior to PT tx.       Pertinent History  PMH CHF, CAD, s/p CABG, HLD, meningioma (stable followed by yearly MRIs), migraines. Pt also reports that his neurologist  assesses him for Parkinson's but has not been diagnosed yet. Stated that he feels like he needs PT due to some weakness he has in his legs, his balance, and to avoid falling.    Limitations  Standing;Walking;House hold activities    How long can you sit comfortably?  NA    How long can you stand comfortably?  5 mins    How long can you walk comfortably?  10-33mins with TM support    Patient Stated Goals  To improve balance, strengthening    Currently in Pain?  No/denies         There.ex.:  Scifit L6 10 min. B UE/LE (warm-up) Standing partial squats 15x in //-bars (cuing for proper technique/ no L ankle pain) Walking partial lunges (moderate cuing to correct posture)- mirror feedback. 5# LE ex.:  Seated marching/ LAQ/ heel and toe lifts 20x (fatigue reported).  Standing 5# marching/ hip abduction and walking in //-bars.  Neuro.mm.:  Obstacle course in //-bars (Airex/ 3" step overs/ 6" step ups)- no UE assist but CGA for cuing and safety. Resisted gait: 2BTB 7x all 4-planes (light UE assist)- moderate cuing to increase posture/ improve kyphosis.  Tandem gait (forward/backwards).      PT Short Term Goals - 08/28/18 1653      PT SHORT TERM GOAL #1   Title  Patient will be compliant with initial HEP in prep for self management of condition    Baseline  administered today    Time  4    Period  Weeks    Status  New    Target Date  09/25/18        PT Long Term Goals - 08/28/18 1653      PT LONG TERM GOAL #1   Title  Pt will be independent with finalized HEP/gym program in order to improve strength and balance in order to decrease fall risk and improve function at home and work.     Baseline  initial HEP administered at eval (08/28/2018)    Time  8    Period  Weeks    Status  New    Target Date  10/23/18      PT LONG TERM GOAL #2   Title  Pt will increase 6MWT by at least 42m (163ft) in order to demonstrate clinically significant improvement in cardiopulmonary endurance and  community ambulation     Baseline  634ft on eval (2/13)    Time  8    Period  Weeks    Status  New    Target Date  10/23/18      PT LONG TERM GOAL #3   Title  The patient will report after 6MWT mild SOB/fatigue demonstrated improved activity tolerance/endurance with functional activities  Baseline  pt reported fatigue/SOB as 4/10 on eval (08/28/2018)    Time  8    Period  Weeks    Status  New    Target Date  10/23/18      PT LONG TERM GOAL #4   Title  The patient will exhibit proper gait mechanics including step length, upright posture and improved bilateral arm swing to promote normalized gait pattern.    Baseline  Pt ambulates with shuffling step, slouched posture, and decreased  arm swing. Able to momentarily correct with verbal cues    Time  8    Period  Weeks    Status  New    Target Date  10/23/18      PT LONG TERM GOAL #5   Title  The patient will demonstrate at least 1/2 MMT grade improvement to indicate improved functional strength and improve ability to perform functional activities.     Baseline  see objective measures (08/28/2018)    Time  8    Period  Weeks    Status  New    Target Date  10/23/18            Plan - 10/02/18 1254    Clinical Impression Statement  PT encouraged pt. t/o tx. session to increase ther.ex./ walking tolerance with fewer rest breaks.  No LOB during tx. session but pt. benefits from use of gait belt with obstacle course to decrease fall risk.  Moderate kyphotic posture during all standing/ balance tasks and PT cued pt. to correct posture/ mirror feedback.  No change to HEP at this time and encouraged to increase walking time on TM at home.        Stability/Clinical Decision Making  Evolving/Moderate complexity    Clinical Decision Making  Moderate    Rehab Potential  Good    Clinical Impairments Affecting Rehab Potential  +motivation, sedentary lifestyle, nature of condition    PT Frequency  2x / week    PT Duration  8 weeks    PT  Treatment/Interventions  ADLs/Self Care Home Management;Moist Heat;Electrical Stimulation;Gait training;Stair training;DME Instruction;Functional mobility training;Therapeutic activities;Therapeutic exercise;Balance training;Patient/family education;Neuromuscular re-education;Manual techniques;Energy conservation;Joint Manipulations;Spinal Manipulations    PT Next Visit Plan  gait training, balance training, strengthening,     PT Home Exercise Plan  see pt instruction section; sit to stands, standing hip extension, standing marching    Consulted and Agree with Plan of Care  Patient       Patient will benefit from skilled therapeutic intervention in order to improve the following deficits and impairments:  Abnormal gait, Decreased balance, Decreased endurance, Decreased mobility, Difficulty walking, Increased muscle spasms, Cardiopulmonary status limiting activity, Decreased range of motion, Improper body mechanics, Decreased activity tolerance, Decreased strength, Postural dysfunction  Visit Diagnosis: Muscle weakness (generalized)  Other abnormalities of gait and mobility     Problem List Patient Active Problem List   Diagnosis Date Noted  . Glaucoma (increased eye pressure) 12/19/2017  . DDD (degenerative disc disease), lumbosacral 12/05/2017  . Urinary frequency 06/21/2017  . Urinary urgency 06/21/2017  . Bilateral carotid artery stenosis 04/11/2017  . Atrial fibrillation ([place]) 01/30/2015  . 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 Stable angina ([place]) 01/07/2015  . Chronic diastolic CHF (congestive heart failure), NYHA class 2 ([place]) 12/16/2014  . Arteriosclerosis of coronary artery 12/10/2014  . GERD (gastroesophageal reflux disease) 06/16/2014  . Benign essential HTN 06/05/2014  . Mixed hyperlipidemia 06/05/2014  . Moderate mitral insufficiency 06/05/2014  . Chest pain 01/12/2014  .  Angina pectoris ([place]) 10/09/2013  . 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 Esophageal disease  10/09/2013  . Benign prostatic hyperplasia with urinary obstruction 04/01/2012  . 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 ED (erectile dysfunction) of organic origin 04/01/2012  . Elevated prostate specific antigen (PSA) 04/01/2012  . Benign neoplasm of cerebral meninges ([place]) 04/24/2011   Pura Spice, PT, DPT # [id number] 10/02/2018, 6:43 PM  [facility] [address]. [place], [place], [address] Phone: [phone]   Fax:  [phone]  Name: MARISSA LOWREY MRN: [id number] Date of Birth: [birth year]

## 2018-10-08 ENCOUNTER — Encounter: Payer: Medicare Other | Admitting: Physical Therapy

## 2018-10-16 ENCOUNTER — Encounter: Payer: Medicare Other | Admitting: Physical Therapy

## 2018-11-26 ENCOUNTER — Other Ambulatory Visit: Payer: Self-pay

## 2018-11-26 ENCOUNTER — Ambulatory Visit (INDEPENDENT_AMBULATORY_CARE_PROVIDER_SITE_OTHER): Payer: Medicare Other | Admitting: Urology

## 2018-11-26 ENCOUNTER — Encounter: Payer: Self-pay | Admitting: Urology

## 2018-11-26 VITALS — BP 134/74 | HR 54 | Ht 74.0 in | Wt 222.3 lb

## 2018-11-26 DIAGNOSIS — R3911 Hesitancy of micturition: Secondary | ICD-10-CM

## 2018-11-26 DIAGNOSIS — N401 Enlarged prostate with lower urinary tract symptoms: Secondary | ICD-10-CM | POA: Diagnosis not present

## 2018-11-26 DIAGNOSIS — R300 Dysuria: Secondary | ICD-10-CM | POA: Diagnosis not present

## 2018-11-26 LAB — URINALYSIS, COMPLETE
Bilirubin, UA: NEGATIVE
Glucose, UA: NEGATIVE
Ketones, UA: NEGATIVE
Leukocytes,UA: NEGATIVE
Nitrite, UA: NEGATIVE
Protein,UA: NEGATIVE
RBC, UA: NEGATIVE
Specific Gravity, UA: 1.025 (ref 1.005–1.030)
Urobilinogen, Ur: 0.2 mg/dL (ref 0.2–1.0)
pH, UA: 6 (ref 5.0–7.5)

## 2018-11-26 LAB — MICROSCOPIC EXAMINATION
Bacteria, UA: NONE SEEN
Epithelial Cells (non renal): NONE SEEN /hpf (ref 0–10)
RBC: NONE SEEN /hpf (ref 0–2)

## 2018-11-26 LAB — BLADDER SCAN AMB NON-IMAGING

## 2018-11-26 NOTE — Progress Notes (Signed)
11/26/2018 1:12 PM   [name] Aug 08, 1940 [id number]  Referring provider: Sofie Hartigan, [place], [place] [address]  Chief Complaint  Patient presents with  . Urinary Hesitency   Urologic history: 1.  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  BPH with lower urinary tract symptoms             -On dutasteride, oxybutynin             -PVP 2008  3.  Erectile dysfunction             -Uses vacuum erection device periodically  HPI: 78 year old male followed for the above problems.  He was last seen February 2020 and was doing well.  He called recently for an appointment due to worsening voiding symptoms.  For the past 2 months he has noted increased urinary hesitancy.  He will dribble at the beginning of his urinary stream and also relates to post void dribbling.  Denies dysuria, gross hematuria or flank/abdominal/pelvic/scrotal pain.  He remains on dutasteride and oxybutynin.  He wanted to get checked because he was concerned of developing urinary retention which has happened in the remote past.  His symptoms do appear to be intermittent stating at times he voids with a good stream.  PMH: Past Medical History:  Diagnosis Date  . Allergy   . Barrett's esophagus   . Blockage of coronary artery of heart ([place])   . BPH (benign prostatic hyperplasia)   . Cataracts, bilateral   . Chronic diastolic CHF (congestive heart failure), NYHA class 2 ([place]) 12/16/2014  . Colon polyp   . Coronary artery disease   . DDD (degenerative disc disease), lumbosacral 12/05/2017  . Essential hypertension 06/05/2014  . GERD (gastroesophageal reflux disease)    Barretts esophagus  . GI bleed    a. ~2012 around time of hiatal hernia surgery. 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  . Glaucoma   . Hemorrhoids   . History of chicken pox   .  History of hiatal hernia 2012  . Hyperlipidemia   . Meningioma ([place])    stable, followed by yearly MRIs  . Migraines   . RBBB   . Stomach ulcer     Surgical History: Past Surgical History:  Procedure Laterality Date  . ATHERECTOMY  1992  . CARDIAC CATHETERIZATION N/A 01/07/2015   Procedure: Left Heart Cath;  Surgeon: Corey Skains, MD;  Location: [facility];  Service: Cardiovascular;  Laterality: N/A;  . CARDIAC SURGERY     coronary angioplasty  . CHOLECYSTECTOMY    . COLONOSCOPY  11/03/08, 03/09/14  . CORONARY ANGIOPLASTY    . CORONARY ARTERY BYPASS GRAFT N/A 01/19/2015   Procedure: CORONARY ARTERY BYPASS GRAFTING (CABG), with LIM to LAD, vein to left intermediate, vein to PD, vein to OM, using right greater saphenous vein via endovein harvest.;  Surgeon: Grace Isaac, MD;  Location: [facility];  Service: Open Heart Surgery;  Laterality: N/A;  . DIAGNOSTIC LAPAROSCOPY     cholecystectomy and hiatal hernia repair  . ENDOVEIN HARVEST OF GREATER SAPHENOUS VEIN Right 01/19/2015   Procedure: ENDOVEIN HARVEST OF GREATER SAPHENOUS VEIN;  Surgeon: Grace Isaac, MD;  Location: [facility];  Service: Open Heart Surgery;  Laterality:  Right;  . ESOPHAGOGASTRODUODENOSCOPY  02/19/13  . ESOPHAGOGASTRODUODENOSCOPY (EGD) WITH PROPOFOL N/A 08/09/2015   Procedure: ESOPHAGOGASTRODUODENOSCOPY (EGD) WITH PROPOFOL;  Surgeon: Hulen Luster, MD;  Location: [facility];  Service: Gastroenterology;  Laterality: N/A;  . ESOPHAGOGASTRODUODENOSCOPY (EGD) WITH PROPOFOL N/A 04/13/2017   Procedure: ESOPHAGOGASTRODUODENOSCOPY (EGD) WITH PROPOFOL;  Surgeon: Toledo, Benay Pike, MD;  Location: [facility] ENDOSCOPY;  Service: Endoscopy;  Laterality: N/A;  . ESOPHAGUS SURGERY    . EYE SURGERY     cataracts BIL  . HERNIA REPAIR    . HIATAL HERNIA REPAIR N/A   . POLYPECTOMY    . TEE WITHOUT CARDIOVERSION N/A 01/19/2015   Procedure: TRANSESOPHAGEAL ECHOCARDIOGRAM (TEE);  Surgeon: Grace Isaac, MD;  Location: [facility];   Service: Open Heart Surgery;  Laterality: N/A;    Home Medications:  Allergies as of 11/26/2018      Reactions   Metoprolol    Oxycodone Other (See Comments)   Reaction:  Hypotension    Sulfadiazine    Sulfa Antibiotics Rash      Medication List       Accurate as of Nov 26, 2018  1:12 PM. If you have any questions, ask your nurse or doctor.        aspirin 81 MG tablet Take 81 mg by mouth daily.   atorvastatin 20 MG tablet Commonly known as:  LIPITOR Take 20 mg by mouth daily.   cetirizine 5 MG tablet Commonly known as:  ZYRTEC Take 5 mg by mouth daily.   chlorhexidine 0.12 % solution Commonly known as:  PERIDEX Use as directed 15 mLs in the mouth or throat 2 (two) times daily.   cyanocobalamin 100 MCG tablet Take 100 mcg by mouth daily.   dicyclomine 10 MG capsule Commonly known as:  BENTYL   diphenhydrAMINE 25 mg capsule Commonly known as:  BENADRYL Take 25 mg by mouth every 6 (six) hours as needed for itching.   dutasteride 0.5 MG capsule Commonly known as:  AVODART Take 1 capsule (0.5 mg total) by mouth daily.   fluticasone 50 MCG/ACT nasal spray Commonly known as:  FLONASE   hydrocortisone cream 1 % Apply 1 application topically as needed for itching.   latanoprost 0.005 % ophthalmic solution Commonly known as:  XALATAN Place 1 drop into both eyes at bedtime.   nitroGLYCERIN 0.4 MG SL tablet Commonly known as:  NITROSTAT Place 0.4 mg under the tongue every 5 (five) minutes as needed for chest pain.   omeprazole 20 MG capsule Commonly known as:  PRILOSEC Take 20 mg by mouth daily.   oxybutynin 5 MG tablet Commonly known as:  DITROPAN 1 tab at bedtime and may take in a.m. as needed for voiding symptoms.   pravastatin 20 MG tablet Commonly known as:  PRAVACHOL   predniSONE 20 MG tablet Commonly known as:  DELTASONE Take 1 tablet (20 mg total) by mouth daily.   Procto-Med HC 2.5 % rectal cream Generic drug:  hydrocortisone Place rectally  2 (two) times daily.   ranitidine 75 MG tablet Commonly known as:  ZANTAC Take 75 mg by mouth at bedtime.   timolol 0.25 % ophthalmic solution Commonly known as:  BETIMOL Place 1 drop into both eyes daily.   triamcinolone cream 0.1 % Commonly known as:  KENALOG Apply 1 application topically 2 (two) times daily as needed (Foot).   V-R MAGNESIUM 250 MG Tabs Generic drug:  Magnesium Take by mouth.       Allergies:  Allergies  Allergen Reactions  .  Metoprolol   . Oxycodone Other (See Comments)    Reaction:  Hypotension   . Sulfadiazine   . Sulfa Antibiotics Rash    Family History: Family History  Problem Relation Age of Onset  . Parkinsonism Mother   . Kidney disease Father   . Cancer Father        prostate  . Heart disease Father   . Cancer Paternal Uncle        esophageal  . Cancer Paternal Uncle     Social History:  reports that he has never smoked. He has never used smokeless tobacco. He reports that he does not drink alcohol or use drugs.  ROS: No significant change from 08/23/2018 except as noted in the HPI  Physical Exam: BP 134/74 (BP Location: Left Arm, Patient Position: Sitting, Cuff Size: Normal)   Pulse (!) 54   Ht 6\' 2"  (1.88 m)   Wt 222 lb 4.8 oz (100.8 kg)   BMI 28.54 kg/m   Constitutional:  Alert and oriented, No acute distress. HEENT: [place] AT, moist mucus membranes.  Trachea midline, no masses. Cardiovascular: No clubbing, cyanosis, or edema. Respiratory: Normal respiratory effort, no increased work of breathing. Skin: No rashes, bruises or suspicious lesions. Neurologic: Grossly intact, no focal deficits, moving all 4 extremities. Psychiatric: Normal mood and affect.  Laboratory Data:  Urinalysis Dipstick/microscopy negative   Assessment & Plan:   78 year old male with 65-month history of worsening obstructive voiding symptoms.  PVR by bladder scan today was 28 mL.  Urinalysis is unremarkable.  We discussed his symptoms are most likely  secondary to progression of BPH.  Discussed monitoring his symptoms depending on how bothersome they are versus starting an alpha-blocker.  He was reassured there is no evidence of impending urinary retention.  He has elected to observe and will keep his scheduled visit in February 2021.  He will instructed to call earlier for worsening voiding symptoms or if he desires to start tamsulosin.   Abbie Sons, [place] [address], [place], [place] [address] [phone]

## 2019-01-14 ENCOUNTER — Emergency Department: Payer: Medicare Other

## 2019-01-14 ENCOUNTER — Emergency Department
Admission: EM | Admit: 2019-01-14 | Discharge: 2019-01-14 | Disposition: A | Discharge status: Home / Self Care | Payer: Medicare Other | Attending: Emergency Medicine | Admitting: Emergency Medicine

## 2019-01-14 ENCOUNTER — Other Ambulatory Visit: Payer: Self-pay

## 2019-01-14 DIAGNOSIS — R079 Chest pain, unspecified: Secondary | ICD-10-CM

## 2019-01-14 DIAGNOSIS — R0789 Other chest pain: Secondary | ICD-10-CM | POA: Diagnosis present

## 2019-01-14 DIAGNOSIS — I110 Hypertensive heart disease with heart failure: Secondary | ICD-10-CM | POA: Diagnosis not present

## 2019-01-14 DIAGNOSIS — Z7982 Long term (current) use of aspirin: Secondary | ICD-10-CM | POA: Insufficient documentation

## 2019-01-14 DIAGNOSIS — I5032 Chronic diastolic (congestive) heart failure: Secondary | ICD-10-CM | POA: Diagnosis not present

## 2019-01-14 DIAGNOSIS — Z79899 Other long term (current) drug therapy: Secondary | ICD-10-CM | POA: Insufficient documentation

## 2019-01-14 LAB — CBC
HCT: 45.9 % (ref 39.0–52.0)
Hemoglobin: 15.4 g/dL (ref 13.0–17.0)
MCH: 32.6 pg (ref 26.0–34.0)
MCHC: 33.6 g/dL (ref 30.0–36.0)
MCV: 97 fL (ref 80.0–100.0)
Platelets: 220 10*3/uL (ref 150–400)
RBC: 4.73 MIL/uL (ref 4.22–5.81)
RDW: 13.1 % (ref 11.5–15.5)
WBC: 7.3 10*3/uL (ref 4.0–10.5)
nRBC: 0 % (ref 0.0–0.2)

## 2019-01-14 LAB — BASIC METABOLIC PANEL
Anion gap: 8 (ref 5–15)
BUN: 17 mg/dL (ref 8–23)
CO2: 27 mmol/L (ref 22–32)
Calcium: 9.6 mg/dL (ref 8.9–10.3)
Chloride: 105 mmol/L (ref 98–111)
Creatinine, Ser: 1.32 mg/dL — ABNORMAL HIGH (ref 0.61–1.24)
GFR calc Af Amer: 59 mL/min — ABNORMAL LOW (ref >60)
GFR calc non Af Amer: 51 mL/min — ABNORMAL LOW (ref >60)
Glucose, Bld: 105 mg/dL — ABNORMAL HIGH (ref 70–99)
Potassium: 4.1 mmol/L (ref 3.5–5.1)
Sodium: 140 mmol/L (ref 135–145)

## 2019-01-14 LAB — TROPONIN I (HIGH SENSITIVITY)
Troponin I (High Sensitivity): 4 ng/L (ref <18)
Troponin I (High Sensitivity): 3 ng/L (ref <18)

## 2019-01-14 MED ORDER — ASPIRIN 81 MG PO CHEW
324.0000 mg | CHEWABLE_TABLET | Freq: Once | ORAL | Status: AC
  Administered 2019-01-14: 324 mg via ORAL
  Filled 2019-01-14 (×2): qty 4

## 2019-01-14 NOTE — ED Provider Notes (Signed)
[facility] Emergency Department Provider Note ____________________________________________   First MD Initiated Contact with Patient 01/14/19 [phone]     (approximate)  I have reviewed the triage vital signs and the nursing notes.  HISTORY  Chief Complaint Chest Pain  HPI  A 78 year old patient with a history of hypertension and hypercholesterolemia presents for evaluation of chest pain. Initial onset of pain was approximately 1-3 hours ago. The patient's chest pain is described as heaviness/pressure/tightness and is not worse with exertion. The patient's chest pain is middle- or left-sided, is not well-localized, is not sharp and does radiate to the arms/jaw/neck. The patient does not complain of nausea and denies diaphoresis. The patient has no history of stroke, has no history of peripheral artery disease, has not smoked in the past 90 days, denies any history of treated diabetes  Past Medical History:  Diagnosis Date   Allergy    Barrett's esophagus    Blockage of coronary artery of heart (HCC)    BPH (benign prostatic hyperplasia)    Cataracts, bilateral    Chronic diastolic CHF (congestive heart failure), NYHA class 2 ([place]) 12/16/2014   Colon polyp    Coronary artery disease    DDD (degenerative disc disease), lumbosacral 12/05/2017   Essential hypertension 06/05/2014   GERD (gastroesophageal reflux disease)    Barretts esophagus   GI bleed    a. ~2012 around time of hiatal hernia surgery. 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   Glaucoma    Hemorrhoids    History of chicken pox    History of hiatal hernia 2012   Hyperlipidemia    Meningioma ([place])    stable, followed by yearly MRIs   Migraines    RBBB    Stomach ulcer     Patient Active Problem List   Diagnosis Date Noted   Meningioma ([place]) 05/30/2018   External hemorrhoids 04/25/2018   Incontinence of feces 04/25/2018   DDD (degenerative  disc disease), cervical 03/07/2018   Glaucoma (increased eye pressure) 12/19/2017   DDD (degenerative disc disease), lumbosacral 12/05/2017   Urinary frequency 06/21/2017   Urinary urgency 06/21/2017   Bilateral carotid artery stenosis 04/11/2017   Atrial fibrillation ([place]) 01/30/2015   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   Stable angina (HCC) 01/07/2015   Chronic diastolic CHF (congestive heart failure), NYHA class 2 ([place]) 12/16/2014   Arteriosclerosis of coronary artery 12/10/2014   GERD (gastroesophageal reflux disease) 06/16/2014   Benign essential HTN 06/05/2014   Mixed hyperlipidemia 06/05/2014   Moderate mitral insufficiency 06/05/2014   Chest pain 01/12/2014   Angina pectoris ([place]) 10/09/2013   Benign neoplasm of large bowel 10/09/2013   Esophageal disease 10/09/2013   Benign prostatic hyperplasia with urinary obstruction 04/01/2012   CA in situ prostate 04/01/2012   ED (erectile dysfunction) of organic origin 04/01/2012   Elevated prostate specific antigen (PSA) 04/01/2012   Benign neoplasm of cerebral meninges ([place]) 04/24/2011    Past Surgical History:  Procedure Laterality Date   ATHERECTOMY  1992   CARDIAC CATHETERIZATION N/A 01/07/2015   Procedure: Left Heart Cath;  Surgeon: Corey Skains, MD;  Location: [facility];  Service: Cardiovascular;  Laterality: N/A;   CARDIAC SURGERY     coronary angioplasty   CHOLECYSTECTOMY     COLONOSCOPY  11/03/08, 03/09/14   CORONARY ANGIOPLASTY     CORONARY ARTERY BYPASS GRAFT N/A 01/19/2015   Procedure: CORONARY ARTERY BYPASS GRAFTING (CABG), with LIM to LAD, vein to left intermediate, vein to  PD, vein to OM, using right greater saphenous vein via endovein harvest.;  Surgeon: Grace Isaac, MD;  Location: [facility];  Service: Open Heart Surgery;  Laterality: N/A;   DIAGNOSTIC LAPAROSCOPY     cholecystectomy and hiatal hernia repair   ENDOVEIN HARVEST OF GREATER SAPHENOUS VEIN Right 01/19/2015    Procedure: ENDOVEIN HARVEST OF GREATER SAPHENOUS VEIN;  Surgeon: Grace Isaac, MD;  Location: [facility];  Service: Open Heart Surgery;  Laterality: Right;   ESOPHAGOGASTRODUODENOSCOPY  02/19/13   ESOPHAGOGASTRODUODENOSCOPY (EGD) WITH PROPOFOL N/A 08/09/2015   Procedure: ESOPHAGOGASTRODUODENOSCOPY (EGD) WITH PROPOFOL;  Surgeon: Hulen Luster, MD;  Location: [facility] ENDOSCOPY;  Service: Gastroenterology;  Laterality: N/A;   ESOPHAGOGASTRODUODENOSCOPY (EGD) WITH PROPOFOL N/A 04/13/2017   Procedure: ESOPHAGOGASTRODUODENOSCOPY (EGD) WITH PROPOFOL;  Surgeon: Toledo, Benay Pike, MD;  Location: [facility] ENDOSCOPY;  Service: Endoscopy;  Laterality: N/A;   ESOPHAGUS SURGERY     EYE SURGERY     cataracts BIL   HERNIA REPAIR     HIATAL HERNIA REPAIR N/A    POLYPECTOMY     TEE WITHOUT CARDIOVERSION N/A 01/19/2015   Procedure: TRANSESOPHAGEAL ECHOCARDIOGRAM (TEE);  Surgeon: Grace Isaac, MD;  Location: [facility];  Service: Open Heart Surgery;  Laterality: N/A;    Prior to Admission medications   Medication Sig Start Date End Date Taking? Authorizing Provider  aspirin EC 81 MG tablet Take 81 mg by mouth daily.   Yes [provider]  cetirizine (ZYRTEC) 5 MG tablet Take 5 mg by mouth daily.   Yes [provider]  chlorhexidine (PERIDEX) 0.12 % solution Use as directed 15 mLs in the mouth or throat 2 (two) times daily as needed (for gum inflammation).    Yes [provider]  cyanocobalamin 100 MCG tablet Take 100 mcg by mouth daily.   Yes [provider]  diphenhydrAMINE (BENADRYL) 25 mg capsule Take 25 mg by mouth every 6 (six) hours as needed for itching.   Yes [provider]  dutasteride (AVODART) 0.5 MG capsule Take 1 capsule (0.5 mg total) by mouth daily. 08/02/18  Yes Stoioff, Ronda Fairly, MD  hydrocortisone cream 1 % Apply 1 application topically 3 (three) times daily as needed for itching.    Yes [provider]  latanoprost (XALATAN) 0.005 % ophthalmic  solution Place 1 drop into both eyes at bedtime.   Yes [provider]  Magnesium (V-R MAGNESIUM) 250 MG TABS Take 250 mg by mouth daily.    Yes [provider]  nitroGLYCERIN (NITROSTAT) 0.4 MG SL tablet Place 0.4 mg under the tongue every 5 (five) minutes as needed for chest pain.   Yes [provider]  omeprazole (PRILOSEC) 40 MG capsule Take 40 mg by mouth daily.   Yes [provider]  oxybutynin (DITROPAN) 5 MG tablet 1 tab at bedtime and may take in a.m. as needed for voiding symptoms. Patient taking differently: Take 5 mg by mouth at bedtime. (may take another tablet in the morning as needed for voiding problems) 08/29/18  Yes Stoioff, Ronda Fairly, MD  pravastatin (PRAVACHOL) 20 MG tablet Take 20 mg by mouth daily.   Yes [provider]  timolol (BETIMOL) 0.25 % ophthalmic solution Place 1 drop into both eyes daily.   Yes [provider]  triamcinolone cream (KENALOG) 0.1 % Apply 1 application topically 2 (two) times daily as needed (Foot).   Yes [provider]    Allergies Metoprolol, Oxycodone, Sulfadiazine, and Sulfa antibiotics  Family History  Problem  Relation Age of Onset   Parkinsonism Mother    Kidney disease Father    Cancer Father        prostate   Heart disease Father    Cancer Paternal Uncle        esophageal   Cancer Paternal Uncle     Social History Social History   Tobacco Use   Smoking status: Never Smoker   Smokeless tobacco: Never Used  Substance Use Topics   Alcohol use: No    Alcohol/week: 0.0 standard drinks   Drug use: No    Review of Systems Constitutional: No fever/chills Eyes: No visual changes. ENT: No sore throat. Cardiovascular: See HPI Respiratory: Denies shortness of breath. Gastrointestinal: No abdominal pain.  Does report a recent history of indigestion and has been treated with PPI medication by his primary Genitourinary: Negative for dysuria. Musculoskeletal:  Negative for back pain. Skin: Negative for rash. Neurological: Negative for headaches, areas of focal weakness or numbness.  He currently reports his chest pain is almost completely gone.  ____________________________________________   PHYSICAL EXAM:  VITAL SIGNS: ED Triage Vitals  Enc Vitals Group     BP 01/14/19 0845 (!) 163/88     Pulse Rate 01/14/19 0845 67     Resp 01/14/19 0845 17     Temp 01/14/19 0845 97.6 F (36.4 C)     Temp Source 01/14/19 0845 Oral     SpO2 01/14/19 0845 97 %     Weight 01/14/19 0844 214 lb (97.1 kg)     Height 01/14/19 0844 6\' 2"  (1.88 m)     Head Circumference --      Peak Flow --      Pain Score 01/14/19 0844 1     Pain Loc --      Pain Edu? --      Excl. in [place]? --     Constitutional: Alert and oriented. Well appearing and in no acute distress. Eyes: Conjunctivae are normal. Head: Atraumatic. Nose: No congestion/rhinnorhea. Mouth/Throat: Mucous membranes are moist. Neck: No stridor.  Cardiovascular: Normal rate, regular rhythm. Grossly normal heart sounds.  Good peripheral circulation. Respiratory: Normal respiratory effort.  No retractions. Lungs CTAB. Gastrointestinal: Soft and nontender. No distention. Musculoskeletal: No lower extremity tenderness nor edema. Neurologic:  Normal speech and language. No gross focal neurologic deficits are appreciated.  Skin:  Skin is warm, dry and intact. No rash noted. Psychiatric: Mood and affect are normal. Speech and behavior are normal.  ____________________________________________   LABS (all labs ordered are listed, but only abnormal results are displayed)  Labs Reviewed  BASIC METABOLIC PANEL - Abnormal; Notable for the following components:      Result Value   Glucose, Bld 105 (*)    Creatinine, Ser 1.32 (*)    GFR calc non Af Amer 51 (*)    GFR calc Af Amer 59 (*)    All other components within normal limits  CBC  TROPONIN I (HIGH SENSITIVITY)  TROPONIN I (HIGH SENSITIVITY)    ____________________________________________  EKG  Reviewed interval me at 1145 Heart rate 60 QRS 150 QTc 440 Normal sinus rhythm, right bundle branch block.  Compared with previous EKG from October 19, 2015 rate has increased, no new ischemic changes ____________________________________________  RADIOLOGY  Dg Chest 2 View  Result Date: 01/14/2019 CLINICAL DATA:  Chest pain EXAM: CHEST - 2 VIEW COMPARISON:  October 19, 2015 FINDINGS: There is slight bibasilar atelectasis. No edema or consolidation. Heart size and pulmonary vascularity normal. Patient  is status post coronary artery bypass grafting. There is aortic atherosclerosis. There is degenerative change in the thoracic spine. IMPRESSION: Slight bibasilar atelectasis. No edema or consolidation. Heart size within normal limits. Aortic Atherosclerosis (ICD10-I70.0). Electronically Signed   By: Lowella Grip III M.D.   On: 01/14/2019 09:28    ____________________________________________   PROCEDURES  Procedure(s) performed: None  Procedures  Critical Care performed: No  ____________________________________________   INITIAL IMPRESSION / ASSESSMENT AND PLAN / ED COURSE  Pertinent labs & imaging results that were available during my care of the patient were reviewed by me and considered in my medical decision making (see chart for details).   Differential diagnosis includes, but is not limited to, ACS, aortic dissection, pulmonary embolism, cardiac tamponade, pneumothorax, pneumonia, pericarditis, myocarditis, GI-related causes including esophagitis/gastritis, and musculoskeletal chest wall pain.    Patient reports his chest pain is almost completely gone.  No pleuritic component.  His symptoms are concerning for ACS and the left-sided pressure with radiation of paresthesia left arm strong history of coronary disease.  He is well-appearing at this time slightly hypertensive.  As his pain is almost completely gone as he  describes it, will observe him closely check troponin lab tests and I certainly anticipate call the cardiology once we collect additional information.  Initial EKG shows no new changes from previous  Patient exhibits no signs symptoms or risk factors that would alert me for concern to pulmonary embolism, dissection.   Clinical Course as of Jan 13 1233  Tue Jan 14, 2019  1004 Patient reports he is completely pain-free and "back to normal now"Await second troponin and then will call his cardiologist for advice and recommendation   [MQ]  1205 Heart pathway advises close follow-up vs obs admission. Have paged cardiologist to discuss   [MQ]  1224 Dr. Josefa Half advises close outpatient follow-up and have patient see Dr. Melvern Sample for clinic visit within about a week   [MQ]    Clinical Course User Index [MQ] Delman Kitten, MD   ----------------------------------------- 12:33 PM on 01/14/2019 -----------------------------------------  Discussed with cardiology, recommend close outpatient follow-up.  Patient remains pain-free and comfortable with this plan.  We will maintain his current regimen including daily baby aspirin.  Return precautions and treatment recommendations and follow-up discussed with the patient who is agreeable with the plan.   ____________________________________________   FINAL CLINICAL IMPRESSION(S) / ED DIAGNOSES  Final diagnoses:  Chest pain with moderate risk for cardiac etiology        Note:  This document was prepared using Dragon voice recognition software and may include unintentional dictation errors       Delman Kitten, MD 01/14/19 1234

## 2019-01-14 NOTE — Discharge Instructions (Addendum)

## 2019-01-14 NOTE — ED Triage Notes (Signed)
Pt reports mid center chest pain that started at 0630 this am. Reports pain wasn't terrible, but had indigestion and left arm "feels weird". Hx of CABG x5, didn't have any nitro at home to take.

## 2019-01-14 NOTE — ED Notes (Signed)
Pt updated about plan. Pt in contact with family

## 2019-01-21 ENCOUNTER — Other Ambulatory Visit: Payer: Self-pay | Admitting: Neurology

## 2019-01-21 DIAGNOSIS — D329 Benign neoplasm of meninges, unspecified: Secondary | ICD-10-CM

## 2019-02-04 ENCOUNTER — Other Ambulatory Visit: Payer: Self-pay

## 2019-02-04 ENCOUNTER — Ambulatory Visit
Admission: RE | Admit: 2019-02-04 | Discharge: 2019-02-04 | Disposition: A | Discharge status: Home / Self Care | Payer: Medicare Other | Source: Ambulatory Visit | Attending: Neurology | Admitting: Neurology

## 2019-02-04 DIAGNOSIS — D329 Benign neoplasm of meninges, unspecified: Secondary | ICD-10-CM | POA: Insufficient documentation

## 2019-02-04 MED ORDER — GADOBUTROL 1 MMOL/ML IV SOLN
9.0000 mL | Freq: Once | INTRAVENOUS | Status: AC | PRN
  Administered 2019-02-04: 9 mL via INTRAVENOUS

## 2019-02-04 MED ORDER — GADOBUTROL 1 MMOL/ML IV SOLN: 10.0000 mL | Freq: Once | INTRAVENOUS | Status: DC | PRN

## 2019-02-13 DIAGNOSIS — R001 Bradycardia, unspecified: Secondary | ICD-10-CM | POA: Insufficient documentation

## 2019-02-18 ENCOUNTER — Telehealth: Payer: Self-pay | Admitting: Urology

## 2019-02-18 DIAGNOSIS — R972 Elevated prostate specific antigen [PSA]: Secondary | ICD-10-CM

## 2019-02-18 NOTE — Telephone Encounter (Signed)
Patient would rather have his labs done in [place] since that Is where is lives. Can we put the order in so he can go tomorrow please and thank you?   Sharyn Lull

## 2019-02-19 ENCOUNTER — Other Ambulatory Visit
Admission: RE | Admit: 2019-02-19 | Discharge: 2019-02-19 | Disposition: A | Discharge status: Home / Self Care | Payer: Medicare Other | Attending: Urology | Admitting: Urology

## 2019-02-19 ENCOUNTER — Other Ambulatory Visit: Payer: Medicare Other

## 2019-02-19 ENCOUNTER — Other Ambulatory Visit: Payer: Self-pay

## 2019-02-19 DIAGNOSIS — R972 Elevated prostate specific antigen [PSA]: Secondary | ICD-10-CM

## 2019-02-19 LAB — PSA: Prostatic Specific Antigen: 6.02 ng/mL — ABNORMAL HIGH (ref 0.00–4.00)

## 2019-02-19 NOTE — Telephone Encounter (Signed)
Order placed

## 2019-02-21 ENCOUNTER — Ambulatory Visit: Payer: Medicare Other | Admitting: Urology

## 2019-03-07 ENCOUNTER — Other Ambulatory Visit: Payer: Self-pay

## 2019-03-07 ENCOUNTER — Ambulatory Visit (INDEPENDENT_AMBULATORY_CARE_PROVIDER_SITE_OTHER): Payer: Medicare Other | Admitting: Urology

## 2019-03-07 ENCOUNTER — Encounter: Payer: Self-pay | Admitting: Urology

## 2019-03-07 VITALS — BP 112/66 | HR 48 | Ht 74.0 in | Wt 220.0 lb

## 2019-03-07 DIAGNOSIS — N401 Enlarged prostate with lower urinary tract symptoms: Secondary | ICD-10-CM

## 2019-03-07 DIAGNOSIS — N138 Other obstructive and reflux uropathy: Secondary | ICD-10-CM | POA: Diagnosis not present

## 2019-03-07 LAB — BLADDER SCAN AMB NON-IMAGING

## 2019-03-07 MED ORDER — TAMSULOSIN HCL 0.4 MG PO CAPS: 0.4000 mg | ORAL_CAPSULE | Freq: Every day | ORAL | 3 refills | Status: DC

## 2019-03-07 NOTE — Progress Notes (Signed)
03/07/2019 12:53 PM   Bradley [name] 06/22/1941 [id number]  Referring provider: Sofie Hartigan, MD [facility],  [place] [address]  Chief Complaint  Patient presents with  . 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 male seen in May 2020 for worsening lower urinary tract symptoms with a 95-month history of increased urinary hesitancy.  He also complains of urinary frequency and urgency.  He kept a voiding diary with volumes and on averages urinating 7-10 times per day.  His urine volumes range between 100-200 mL.  A PSA drawn on 8/5 was 6.02 (uncorrected)   PMH: Past Medical History:  Diagnosis Date  . Allergy   . Barrett's esophagus   . Blockage of coronary artery of heart ([place])   . BPH (benign prostatic hyperplasia)   . Cataracts, bilateral   . Chronic diastolic CHF (congestive heart failure), NYHA class 2 ([place]) 12/16/2014  . Colon polyp   . Coronary artery disease   . DDD (degenerative disc disease), lumbosacral 12/05/2017  . Essential hypertension 06/05/2014  . GERD (gastroesophageal reflux disease)    Barretts esophagus  . GI bleed    a. ~2012 around time of hiatal hernia surgery. 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  . Glaucoma   . Hemorrhoids   . History of chicken pox   . History of hiatal hernia 2012  . Hyperlipidemia   . Meningioma ([place])    stable, followed by yearly MRIs  . Migraines   . RBBB   . Stomach ulcer     Surgical History: Past Surgical History:  Procedure Laterality Date  . ATHERECTOMY  1992  . CARDIAC CATHETERIZATION N/A 01/07/2015   Procedure: Left Heart Cath;  Surgeon: Corey Skains, MD;  Location: [facility];  Service: Cardiovascular;  Laterality: N/A;  . CARDIAC SURGERY     coronary angioplasty  . CHOLECYSTECTOMY    . COLONOSCOPY  11/03/08, 03/09/14  . CORONARY ANGIOPLASTY    . CORONARY ARTERY BYPASS GRAFT N/A 01/19/2015   Procedure: CORONARY ARTERY BYPASS GRAFTING (CABG), with LIM to LAD, vein to left intermediate, vein to PD, vein to OM, using right greater saphenous vein via endovein harvest.;  Surgeon: Grace Isaac, MD;  Location: [facility];  Service: Open Heart Surgery;  Laterality: N/A;  . DIAGNOSTIC LAPAROSCOPY     cholecystectomy and hiatal hernia repair  . ENDOVEIN HARVEST OF GREATER SAPHENOUS VEIN Right 01/19/2015   Procedure: ENDOVEIN HARVEST OF GREATER SAPHENOUS VEIN;  Surgeon: Grace Isaac, MD;  Location: [facility];  Service: Open Heart Surgery;  Laterality: Right;  . ESOPHAGOGASTRODUODENOSCOPY  02/19/13  . ESOPHAGOGASTRODUODENOSCOPY (EGD) WITH PROPOFOL N/A 08/09/2015   Procedure: ESOPHAGOGASTRODUODENOSCOPY (EGD) WITH PROPOFOL;  Surgeon: Hulen Luster, MD;  Location: [facility];  Service: Gastroenterology;  Laterality: N/A;  . ESOPHAGOGASTRODUODENOSCOPY (EGD) WITH PROPOFOL N/A 04/13/2017   Procedure: ESOPHAGOGASTRODUODENOSCOPY (EGD) WITH PROPOFOL;  Surgeon: Toledo, Benay Pike, MD;  Location: [facility] ENDOSCOPY;  Service: Endoscopy;  Laterality: N/A;  . ESOPHAGUS SURGERY    . EYE SURGERY     cataracts BIL  . HERNIA REPAIR    . HIATAL HERNIA REPAIR N/A   . POLYPECTOMY    . TEE WITHOUT CARDIOVERSION N/A 01/19/2015   Procedure: TRANSESOPHAGEAL  ECHOCARDIOGRAM (TEE);  Surgeon: Grace Isaac, MD;  Location: [facility];  Service: Open Heart Surgery;  Laterality: N/A;    Home Medications:  Allergies as of 03/07/2019      Reactions   Metoprolol    Oxycodone Other (See Comments)   Reaction:  Hypotension    Sulfadiazine    Sulfa Antibiotics Rash      Medication List       Accurate as of March 07, 2019  12:53 PM. If you have any questions, ask your nurse or doctor.        aspirin EC 81 MG tablet Take 81 mg by mouth daily.   cetirizine 5 MG tablet Commonly known as: ZYRTEC Take 5 mg by mouth daily.   chlorhexidine 0.12 % solution Commonly known as: PERIDEX Use as directed 15 mLs in the mouth or throat 2 (two) times daily as needed (for gum inflammation).   cyanocobalamin 100 MCG tablet Take 100 mcg by mouth daily.   diphenhydrAMINE 25 mg capsule Commonly known as: BENADRYL Take 25 mg by mouth every 6 (six) hours as needed for itching.   dutasteride 0.5 MG capsule Commonly known as: AVODART Take 1 capsule (0.5 mg total) by mouth daily.   hydrocortisone 2.5 % rectal cream Commonly known as: ANUSOL-HC   hydrocortisone cream 1 % Apply 1 application topically 3 (three) times daily as needed for itching.   latanoprost 0.005 % ophthalmic solution Commonly known as: XALATAN Place 1 drop into both eyes at bedtime.   nitroGLYCERIN 0.4 MG SL tablet Commonly known as: NITROSTAT Place 0.4 mg under the tongue every 5 (five) minutes as needed for chest pain.   omeprazole 20 MG capsule Commonly known as: PRILOSEC Take 20 mg by mouth daily.   oxybutynin 5 MG tablet Commonly known as: DITROPAN 1 tab at bedtime and may take in a.m. as needed for voiding symptoms. What changed:   how much to take  how to take this  when to take this  additional instructions   pravastatin 20 MG tablet Commonly known as: PRAVACHOL Take 20 mg by mouth daily.   timolol 0.25 % ophthalmic solution Commonly known as: BETIMOL Place 1 drop into both eyes daily.   triamcinolone cream 0.1 % Commonly known as: KENALOG Apply 1 application topically 2 (two) times daily as needed (Foot).   V-R MAGNESIUM 250 MG Tabs Generic drug: Magnesium Take 250 mg by mouth daily.       Allergies:  Allergies  Allergen Reactions  . Metoprolol   . Oxycodone Other (See Comments)    Reaction:  Hypotension    . Sulfadiazine   . Sulfa Antibiotics Rash    Family History: Family History  Problem Relation Age of Onset  . Parkinsonism Mother   . Kidney disease Father   . Cancer Father        prostate  . Heart disease Father   . Cancer Paternal Uncle        esophageal  . Cancer Paternal Uncle     Social History:  reports that he has never smoked. He has never used smokeless tobacco. He reports that he does not drink alcohol or use drugs.  ROS: UROLOGY Frequent Urination?: Yes Hard to postpone urination?: Yes Burning/pain with urination?: No Get up at night to urinate?: No Leakage of urine?: No Urine stream starts and stops?: No Trouble starting stream?: Yes Do you have to strain to urinate?: No Blood in urine?: No Urinary tract infection?: No Sexually transmitted disease?:  No Injury to kidneys or bladder?: No Painful intercourse?: No Weak stream?: No Erection problems?: No Penile pain?: No  Gastrointestinal Nausea?: No Vomiting?: No Indigestion/heartburn?: No Diarrhea?: No Constipation?: No  Constitutional Fever: No Night sweats?: No Weight loss?: No Fatigue?: No  Skin Skin rash/lesions?: No Itching?: No  Eyes Blurred vision?: No Double vision?: No  Ears/Nose/Throat Sore throat?: No Sinus problems?: No  Hematologic/Lymphatic Swollen glands?: No Easy bruising?: Yes  Cardiovascular Leg swelling?: No Chest pain?: No  Respiratory Cough?: No Shortness of breath?: No  Endocrine Excessive thirst?: No  Musculoskeletal Back pain?: Yes Joint pain?: No  Neurological Headaches?: No Dizziness?: No  Psychologic Depression?: No Anxiety?: No  Physical Exam: BP 112/66 (BP Location: Left Arm, Patient Position: Sitting, Cuff Size: Normal)   Pulse (!) 48   Ht 6\' 2"  (1.88 m)   Wt 220 lb (99.8 kg)   BMI 28.25 kg/m   Constitutional:  Alert and oriented, No acute distress. HEENT: [place] AT, moist mucus membranes.  Trachea midline, no masses.  Cardiovascular: No clubbing, cyanosis, or edema. Respiratory: Normal respiratory effort, no increased work of breathing. GI: Abdomen is soft, nontender, nondistended, no abdominal masses GU: No CVA tenderness Lymph: No cervical or inguinal lymphadenopathy. Skin: No rashes, bruises or suspicious lesions. Neurologic: Grossly intact, no focal deficits, moving all 4 extremities. Psychiatric: Normal mood and affect.   Assessment & Plan:    - BPH with obstruction/lower urinary tract symptoms PVR by bladder scan today was 204 mL.  He had been on tamsulosin for several years which was discontinued after his PVP in 2008.  Will restart tamsulosin and obtain a follow-up nurse visit/bladder scan for PVR in 1 month.  He was instructed to call earlier for worsening voiding symptoms.  If residual no better will discontinue oxybutynin and schedule cystoscopy.  - Elevated PSA Uncorrected PSA is slightly above baseline and will recheck in approximately 3 months.   Abbie Sons, [place] [address], [place], [place] [address] [phone]

## 2019-04-10 ENCOUNTER — Other Ambulatory Visit: Payer: Self-pay

## 2019-04-10 ENCOUNTER — Ambulatory Visit (INDEPENDENT_AMBULATORY_CARE_PROVIDER_SITE_OTHER): Payer: Medicare Other

## 2019-04-10 DIAGNOSIS — N138 Other obstructive and reflux uropathy: Secondary | ICD-10-CM

## 2019-04-10 DIAGNOSIS — N401 Enlarged prostate with lower urinary tract symptoms: Secondary | ICD-10-CM | POA: Diagnosis not present

## 2019-04-10 LAB — BLADDER SCAN AMB NON-IMAGING

## 2019-04-10 NOTE — Patient Instructions (Signed)
d 

## 2019-04-10 NOTE — Progress Notes (Signed)
Pt presents in clinic today for one month PVR. Pt's PVR 262mL. Per Dr. Dene Gentry last note pt instructed to discontinue oxybutynin, pt also scheduled for cysto per providers note. Pt gave verbal understanding. Pt scheduled.

## 2019-04-16 ENCOUNTER — Other Ambulatory Visit: Payer: Medicare Other | Admitting: Urology

## 2019-04-16 ENCOUNTER — Ambulatory Visit (INDEPENDENT_AMBULATORY_CARE_PROVIDER_SITE_OTHER): Payer: Medicare Other | Admitting: Urology

## 2019-04-16 ENCOUNTER — Other Ambulatory Visit: Payer: Self-pay

## 2019-04-16 ENCOUNTER — Encounter: Payer: Self-pay | Admitting: Urology

## 2019-04-16 VITALS — BP 127/76 | HR 64 | Ht 74.0 in | Wt 220.0 lb

## 2019-04-16 DIAGNOSIS — N138 Other obstructive and reflux uropathy: Secondary | ICD-10-CM | POA: Diagnosis not present

## 2019-04-16 DIAGNOSIS — N401 Enlarged prostate with lower urinary tract symptoms: Secondary | ICD-10-CM | POA: Diagnosis not present

## 2019-04-16 MED ORDER — LIDOCAINE HCL URETHRAL/MUCOSAL 2 % EX GEL
1.0000 "application " | Freq: Once | CUTANEOUS | Status: AC
  Administered 2019-04-16: 1 via URETHRAL

## 2019-04-16 NOTE — Progress Notes (Signed)
   04/16/19  CC:  Chief Complaint  Patient presents with  . Cysto    HPI: Worsening obstructive voiding symptoms and PVR 200 mL  Blood pressure 127/76, pulse 64, height 6\' 2"  (1.88 m), weight 220 lb (99.8 kg). NED. A&Ox3.   No respiratory distress   Abd soft, NT, ND Normal phallus with bilateral descended testicles  Cystoscopy Procedure Note  Patient identification was confirmed, informed consent was obtained, and patient was prepped using Betadine solution.  Lidocaine jelly was administered per urethral meatus.     Pre-Procedure: - Inspection reveals a normal caliber urethral meatus.  Procedure: The flexible cystoscope was introduced without difficulty - No urethral strictures/lesions are present. - Open bladder neck with adenoma regrowth mid to distal prostate-lateral lobes touching mid to distal prostate  - Mild elevation bladder neck - Bilateral ureteral orifices identified - Bladder mucosa  reveals no ulcers, tumors, or lesions - No bladder stones - No trabeculation  Retroflexion shows no intravesical median lobe   Post-Procedure: - Patient tolerated the procedure well  Assessment/ Plan: Adenoma regrowth mid-distal prostate.  He is on tamsulosin/Dutasteride.  We discussed surgical options including TURP, PVP.  Minimal invasive option of UroLift was also discussed.  He would like to continue medical management for now.  Follow-up 6 months for symptom reassessment and PVR.   Abbie Sons, MD

## 2019-04-18 ENCOUNTER — Telehealth: Payer: Self-pay | Admitting: Urology

## 2019-04-18 NOTE — Telephone Encounter (Signed)
He may have had some post procedure swelling.  If symptoms persist would check UA for infection.

## 2019-04-18 NOTE — Telephone Encounter (Signed)
Pt has cysto w/[facility] on Wednesday.  He said he passed a little blood after, but now that's O.K.  He started having problems today w/frequent urination and dribbling.

## 2019-04-18 NOTE — Telephone Encounter (Signed)
Called pt informed him of the information below. Pt gave verbal understanding.  

## 2019-04-20 ENCOUNTER — Encounter: Payer: Self-pay | Admitting: Urology

## 2019-04-22 ENCOUNTER — Ambulatory Visit (INDEPENDENT_AMBULATORY_CARE_PROVIDER_SITE_OTHER): Payer: Medicare Other | Admitting: Physician Assistant

## 2019-04-22 ENCOUNTER — Encounter: Payer: Self-pay | Admitting: Physician Assistant

## 2019-04-22 ENCOUNTER — Other Ambulatory Visit: Payer: Self-pay

## 2019-04-22 VITALS — BP 143/80 | HR 65 | Ht 74.0 in | Wt 220.1 lb

## 2019-04-22 DIAGNOSIS — R350 Frequency of micturition: Secondary | ICD-10-CM | POA: Diagnosis not present

## 2019-04-22 DIAGNOSIS — R3915 Urgency of urination: Secondary | ICD-10-CM | POA: Diagnosis not present

## 2019-04-22 DIAGNOSIS — R300 Dysuria: Secondary | ICD-10-CM | POA: Diagnosis not present

## 2019-04-22 DIAGNOSIS — N3001 Acute cystitis with hematuria: Secondary | ICD-10-CM | POA: Diagnosis not present

## 2019-04-22 LAB — BLADDER SCAN AMB NON-IMAGING: Scan Result: 16

## 2019-04-22 MED ORDER — CIPROFLOXACIN HCL 500 MG PO TABS: 500.0000 mg | ORAL_TABLET | Freq: Two times a day (BID) | ORAL | 0 refills | Status: AC

## 2019-04-22 NOTE — Progress Notes (Signed)
04/22/2019 9:23 AM   Bradley [name] 01/10/1941 [id number]  CC: Dysuria, frequency  HPI: Bradley [name] is a 78 y.o. male who presents today for evaluation of possible UTI. He is an established [facility] patient who last saw Dr. Bernardo Heater on 04/16/2019 for cystoscopy for evaluation of obstructive voiding symptoms and elevated PVR.  He reports a 4-day history of fatigue, urinary urgency, and frequency. He denies fevers, chills, nausea, vomiting, flank pain, and gross hematuria.  In-office UA today positive for 3+ blood, 2+ protein, nitrites, and 2+ leukocyte esterase; urine microscopy with >30 WBCs/HPF, >30 RBCs/HPF, and many bacteria. PVR 56mL.  He reports allergies to sulfa antibiotics.  PMH: Past Medical History:  Diagnosis Date  . Allergy   . Barrett's esophagus   . Blockage of coronary artery of heart ([place])   . BPH (benign prostatic hyperplasia)   . Cataracts, bilateral   . Chronic diastolic CHF (congestive heart failure), NYHA class 2 ([place]) 12/16/2014  . Colon polyp   . Coronary artery disease   . DDD (degenerative disc disease), lumbosacral 12/05/2017  . Essential hypertension 06/05/2014  . GERD (gastroesophageal reflux disease)    Barretts esophagus  . GI bleed    a. ~2012 around time of hiatal hernia surgery. 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  . Glaucoma   . Hemorrhoids   . History of chicken pox   . History of hiatal hernia 2012  . Hyperlipidemia   . Meningioma ([place])    stable, followed by yearly MRIs  . Migraines   . RBBB   . Stomach ulcer     Surgical History: Past Surgical History:  Procedure Laterality Date  . ATHERECTOMY  1992  . CARDIAC CATHETERIZATION N/A 01/07/2015   Procedure: Left Heart Cath;  Surgeon: Corey Skains, MD;  Location: [facility];  Service: Cardiovascular;  Laterality: N/A;  . CARDIAC SURGERY     coronary angioplasty  . CHOLECYSTECTOMY    . COLONOSCOPY  11/03/08, 03/09/14  . CORONARY ANGIOPLASTY    .  CORONARY ARTERY BYPASS GRAFT N/A 01/19/2015   Procedure: CORONARY ARTERY BYPASS GRAFTING (CABG), with LIM to LAD, vein to left intermediate, vein to PD, vein to OM, using right greater saphenous vein via endovein harvest.;  Surgeon: Grace Isaac, MD;  Location: [facility];  Service: Open Heart Surgery;  Laterality: N/A;  . DIAGNOSTIC LAPAROSCOPY     cholecystectomy and hiatal hernia repair  . ENDOVEIN HARVEST OF GREATER SAPHENOUS VEIN Right 01/19/2015   Procedure: ENDOVEIN HARVEST OF GREATER SAPHENOUS VEIN;  Surgeon: Grace Isaac, MD;  Location: [facility];  Service: Open Heart Surgery;  Laterality: Right;  . ESOPHAGOGASTRODUODENOSCOPY  02/19/13  . ESOPHAGOGASTRODUODENOSCOPY (EGD) WITH PROPOFOL N/A 08/09/2015   Procedure: ESOPHAGOGASTRODUODENOSCOPY (EGD) WITH PROPOFOL;  Surgeon: Hulen Luster, MD;  [facility];  Service: Gastroenterology;  Laterality: N/A;  . ESOPHAGOGASTRODUODENOSCOPY (EGD) WITH PROPOFOL N/A 04/13/2017   Procedure: ESOPHAGOGASTRODUODENOSCOPY (EGD) WITH PROPOFOL;  Surgeon: Toledo, Benay Pike, MD;  Location: [facility] ENDOSCOPY;  Service: Endoscopy;  Laterality: N/A;  . ESOPHAGUS SURGERY    . EYE SURGERY     cataracts BIL  . HERNIA REPAIR    . HIATAL HERNIA REPAIR N/A   . POLYPECTOMY    . TEE WITHOUT CARDIOVERSION N/A 01/19/2015   Procedure: TRANSESOPHAGEAL ECHOCARDIOGRAM (TEE);  Surgeon: Grace Isaac, MD;  Location: [facility];  Service: Open Heart Surgery;  Laterality: N/A;    Home Medications:  Allergies as of 04/22/2019  Reactions   Metoprolol    Oxycodone Other (See Comments)   Reaction:  Hypotension    Sulfadiazine    Sulfa Antibiotics Rash      Medication List       Accurate as of April 22, 2019 11:59 PM. If you have any questions, ask your nurse or doctor.        aspirin EC 81 MG tablet Take 81 mg by mouth daily.   cetirizine 5 MG tablet Commonly known as: ZYRTEC Take 5 mg by mouth daily.   chlorhexidine 0.12 % solution Commonly known as: PERIDEX  Use as directed 15 mLs in the mouth or throat 2 (two) times daily as needed (for gum inflammation).   ciprofloxacin 500 MG tablet Commonly known as: Cipro Take 1 tablet (500 mg total) by mouth 2 (two) times daily for 7 days. Started by: Debroah Loop, PA-C   cyanocobalamin 100 MCG tablet Take 100 mcg by mouth daily.   diphenhydrAMINE 25 mg capsule Commonly known as: BENADRYL Take 25 mg by mouth every 6 (six) hours as needed for itching.   dutasteride 0.5 MG capsule Commonly known as: AVODART Take 1 capsule (0.5 mg total) by mouth daily.   hydrocortisone 2.5 % rectal cream Commonly known as: ANUSOL-HC   hydrocortisone cream 1 % Apply 1 application topically 3 (three) times daily as needed for itching.   latanoprost 0.005 % ophthalmic solution Commonly known as: XALATAN Place 1 drop into both eyes at bedtime.   nitroGLYCERIN 0.4 MG SL tablet Commonly known as: NITROSTAT Place 0.4 mg under the tongue every 5 (five) minutes as needed for chest pain.   omeprazole 20 MG capsule Commonly known as: PRILOSEC Take 20 mg by mouth daily.   oxybutynin 5 MG tablet Commonly known as: DITROPAN 1 tab at bedtime and may take in a.m. as needed for voiding symptoms. What changed:   how much to take  how to take this  when to take this  additional instructions   pravastatin 20 MG tablet Commonly known as: PRAVACHOL Take 20 mg by mouth daily.   tamsulosin 0.4 MG Caps capsule Commonly known as: FLOMAX Take 1 capsule (0.4 mg total) by mouth daily.   timolol 0.25 % ophthalmic solution Commonly known as: BETIMOL Place 1 drop into both eyes daily.   triamcinolone cream 0.1 % Commonly known as: KENALOG Apply 1 application topically 2 (two) times daily as needed (Foot).   V-R MAGNESIUM 250 MG Tabs Generic drug: Magnesium Take 250 mg by mouth daily.       Allergies:  Allergies  Allergen Reactions  . Metoprolol   . Oxycodone Other (See Comments)    Reaction:   Hypotension   . Sulfadiazine   . Sulfa Antibiotics Rash    Family History: Family History  Problem Relation Age of Onset  . Parkinsonism Mother   . Kidney disease Father   . Cancer Father        prostate  . Heart disease Father   . Cancer Paternal Uncle        esophageal  . Cancer Paternal Uncle     Social History:   reports that he has never smoked. He has never used smokeless tobacco. He reports that he does not drink alcohol or use drugs.  ROS: UROLOGY Frequent Urination?: Yes Hard to postpone urination?: Yes Burning/pain with urination?: No Get up at night to urinate?: Yes Leakage of urine?: Yes Urine stream starts and stops?: No Trouble starting stream?: Yes Do you have  to strain to urinate?: No Blood in urine?: No Urinary tract infection?: No Sexually transmitted disease?: No Injury to kidneys or bladder?: No Painful intercourse?: No Weak stream?: No Erection problems?: No Penile pain?: No  Gastrointestinal Nausea?: No Vomiting?: No Indigestion/heartburn?: No Diarrhea?: No Constipation?: Yes  Constitutional Fever: No Night sweats?: No Weight loss?: No Fatigue?: No  Skin Skin rash/lesions?: No Itching?: Yes  Eyes Blurred vision?: No Double vision?: No  Ears/Nose/Throat Sore throat?: No Sinus problems?: No  Hematologic/Lymphatic Swollen glands?: No Easy bruising?: Yes  Cardiovascular Leg swelling?: No Chest pain?: No  Respiratory Cough?: No Shortness of breath?: No  Endocrine Excessive thirst?: No  Musculoskeletal Back pain?: No Joint pain?: No  Neurological Headaches?: No Dizziness?: No  Psychologic Depression?: No Anxiety?: No  Physical Exam: BP (!) 143/80 (BP Location: Left Arm, Patient Position: Sitting, Cuff Size: Normal)   Pulse 65   Ht 6\' 2"  (1.88 m)   Wt 220 lb 1.6 oz (99.8 kg)   BMI 28.26 kg/m   Constitutional:  Alert and oriented, no acute distress, nontoxic appearing HEENT: [place], AT Cardiovascular: No  clubbing, cyanosis, or edema Respiratory: Normal respiratory effort, no increased work of breathing Skin: No rashes, bruises or suspicious lesions Neurologic: Grossly intact, no focal deficits, moving all 4 extremities Psychiatric: Normal mood and affect  Laboratory Data: Results for orders placed or performed in visit on 04/22/19  Microscopic Examination   URINE  Result Value Ref Range   WBC, UA >30 (A) 0 - 5 /hpf   RBC >30 (A) 0 - 2 /hpf   Epithelial Cells (non renal) 0-10 0 - 10 /hpf   Renal Epithel, UA 0-10 (A) None seen /hpf   Bacteria, UA Many (A) None seen/Few  Urinalysis, Complete  Result Value Ref Range   Specific Gravity, UA 1.025 1.005 - 1.030   pH, UA 5.5 5.0 - 7.5   Color, UA Yellow Yellow   Appearance Ur Cloudy (A) Clear   Leukocytes,UA 2+ (A) Negative   Protein,UA 2+ (A) Negative/Trace   Glucose, UA Negative Negative   Ketones, UA Negative Negative   RBC, UA 3+ (A) Negative   Bilirubin, UA Negative Negative   Urobilinogen, Ur 0.2 0.2 - 1.0 mg/dL   Nitrite, UA Positive (A) Negative   Microscopic Examination See below:   Bladder Scan (Post Void Residual) in office  Result Value Ref Range   Scan Result 16    Assessment & Plan:   1. Urinary urgency Patient with irritative voiding symptoms in the setting of elevated PVR and recent cystoscopy.  UA grossly positive.  Will send for culture. - Urinalysis, Complete - CULTURE, URINE COMPREHENSIVE  2. Urinary frequency PVR WNL today.  I am not concerned for urinary retention at this time. - Bladder Scan (Post Void Residual) in office  3. Acute cystitis with hematuria We will treat with empiric antibiotics today.  I counseled the patient that I would contact him if his urine culture indicated a necessary change in therapy.  I would like him to return to clinic for follow-up UA to prove resolution of hematuria following completion of these antibiotics.  He expressed understanding. - ciprofloxacin (CIPRO) 500 MG tablet;  Take 1 tablet (500 mg total) by mouth 2 (two) times daily for 7 days.  Dispense: 14 tablet; Refill: 0 - Urinalysis, Complete; Future  Return in about 10 days (around 05/02/2019) for Lab visit for UA.  Debroah Loop, PA-C  [facility] [address], [place], [place]  [address] [phone]    

## 2019-04-23 LAB — URINALYSIS, COMPLETE
Bilirubin, UA: NEGATIVE
Glucose, UA: NEGATIVE
Ketones, UA: NEGATIVE
Nitrite, UA: POSITIVE — AB
Specific Gravity, UA: 1.025 (ref 1.005–1.030)
Urobilinogen, Ur: 0.2 mg/dL (ref 0.2–1.0)
pH, UA: 5.5 (ref 5.0–7.5)

## 2019-04-23 LAB — MICROSCOPIC EXAMINATION
RBC, Urine: >30 /hpf — AB (ref 0–2)
WBC, UA: >30 /hpf — AB (ref 0–5)

## 2019-04-29 LAB — CULTURE, URINE COMPREHENSIVE

## 2019-05-02 ENCOUNTER — Other Ambulatory Visit: Payer: Medicare Other

## 2019-05-02 ENCOUNTER — Other Ambulatory Visit: Payer: Self-pay

## 2019-05-02 DIAGNOSIS — N3001 Acute cystitis with hematuria: Secondary | ICD-10-CM

## 2019-05-02 LAB — MICROSCOPIC EXAMINATION: Bacteria, UA: NONE SEEN

## 2019-05-02 LAB — URINALYSIS, COMPLETE
Bilirubin, UA: NEGATIVE
Glucose, UA: NEGATIVE
Ketones, UA: NEGATIVE
Leukocytes,UA: NEGATIVE
Nitrite, UA: NEGATIVE
Protein,UA: NEGATIVE
Specific Gravity, UA: 1.025 (ref 1.005–1.030)
Urobilinogen, Ur: 0.2 mg/dL (ref 0.2–1.0)
pH, UA: 5.5 (ref 5.0–7.5)

## 2019-06-18 ENCOUNTER — Encounter: Payer: Self-pay | Admitting: Urology

## 2019-06-18 ENCOUNTER — Other Ambulatory Visit: Payer: Self-pay

## 2019-06-18 ENCOUNTER — Ambulatory Visit (INDEPENDENT_AMBULATORY_CARE_PROVIDER_SITE_OTHER): Payer: Medicare Other | Admitting: Urology

## 2019-06-18 VITALS — BP 126/75 | HR 54 | Ht 74.0 in | Wt 220.0 lb

## 2019-06-18 DIAGNOSIS — N401 Enlarged prostate with lower urinary tract symptoms: Secondary | ICD-10-CM

## 2019-06-18 DIAGNOSIS — N138 Other obstructive and reflux uropathy: Secondary | ICD-10-CM | POA: Diagnosis not present

## 2019-06-18 LAB — BLADDER SCAN AMB NON-IMAGING

## 2019-06-18 MED ORDER — SILODOSIN 8 MG PO CAPS: 8.0000 mg | ORAL_CAPSULE | Freq: Every day | ORAL | 3 refills | Status: DC

## 2019-06-18 NOTE — Progress Notes (Signed)
06/18/2019 12:15 PM   Bradley [name] July 12, 1941 [id number]  Referring provider: Sofie Hartigan, [place],  [place] [address]  Chief Complaint  Patient presents with  . Benign Prostatic Hypertrophy    HPI: 78 y.o. male called for appointment for worsening lower urinary tract symptoms.  He has a long history of BPH.  He underwent cystoscopy on 04/16/2019 which did show adenoma regrowth mid to distal prostate.  After discussing surgical options versus continued medical management he elected the latter.  He states he was told to discontinue his tamsulosin after the cystoscopy however nothing in my note discusses stopping the alpha-blocker.  He did come back in on 10/6 complaining of worsening symptoms and saw our PA. His urine culture did grow Klebsiella.  He came in today for worsening voiding symptoms including weak stream, hesitancy, frequency and urgency.  PVR by bladder scan was 145 mL.  He currently is taking dutasteride and oxybutynin at bedtime for nocturia.   PMH: Past Medical History:  Diagnosis Date  . Allergy   . Barrett's esophagus   . Blockage of coronary artery of heart ([place])   . BPH (benign prostatic hyperplasia)   . Cataracts, bilateral   . Chronic diastolic CHF (congestive heart failure), NYHA class 2 ([place]) 12/16/2014  . Colon polyp   . Coronary artery disease   . DDD (degenerative disc disease), lumbosacral 12/05/2017  . Essential hypertension 06/05/2014  . GERD (gastroesophageal reflux disease)    Barretts esophagus  . GI bleed    a. ~2012 around time of hiatal hernia surgery. 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  . Glaucoma   . Hemorrhoids   . History of chicken pox   . History of hiatal hernia 2012  . Hyperlipidemia   . Meningioma ([place])    stable, followed by yearly MRIs  . Migraines   . RBBB   . Stomach ulcer     Surgical History: Past Surgical History:  Procedure Laterality Date  . ATHERECTOMY  1992  .  CARDIAC CATHETERIZATION N/A 01/07/2015   Procedure: Left Heart Cath;  Surgeon: Corey Skains, MD;  Location: [facility];  Service: Cardiovascular;  Laterality: N/A;  . CARDIAC SURGERY     coronary angioplasty  . CHOLECYSTECTOMY    . COLONOSCOPY  11/03/08, 03/09/14  . CORONARY ANGIOPLASTY    . CORONARY ARTERY BYPASS GRAFT N/A 01/19/2015   Procedure: CORONARY ARTERY BYPASS GRAFTING (CABG), with LIM to LAD, vein to left intermediate, vein to PD, vein to OM, using right greater saphenous vein via endovein harvest.;  Surgeon: Grace Isaac, MD;  Location: [facility];  Service: Open Heart Surgery;  Laterality: N/A;  . DIAGNOSTIC LAPAROSCOPY     cholecystectomy and hiatal hernia repair  . ENDOVEIN HARVEST OF GREATER SAPHENOUS VEIN Right 01/19/2015   Procedure: ENDOVEIN HARVEST OF GREATER SAPHENOUS VEIN;  Surgeon: Grace Isaac, MD;  Location: [facility];  Service: Open Heart Surgery;  Laterality: Right;  . ESOPHAGOGASTRODUODENOSCOPY  02/19/13  . ESOPHAGOGASTRODUODENOSCOPY (EGD) WITH PROPOFOL N/A 08/09/2015   Procedure: ESOPHAGOGASTRODUODENOSCOPY (EGD) WITH PROPOFOL;  Surgeon: Hulen Luster, MD;  Location: [facility];  Service: Gastroenterology;  Laterality: N/A;  . ESOPHAGOGASTRODUODENOSCOPY (EGD) WITH PROPOFOL N/A 04/13/2017   Procedure: ESOPHAGOGASTRODUODENOSCOPY (EGD) WITH PROPOFOL;  Surgeon: [place], Benay Pike, MD;  Location: [facility] ENDOSCOPY;  Service: Endoscopy;  Laterality: N/A;  . ESOPHAGUS SURGERY    . EYE SURGERY     cataracts BIL  . HERNIA REPAIR    .  HIATAL HERNIA REPAIR N/A   . POLYPECTOMY    . TEE WITHOUT CARDIOVERSION N/A 01/19/2015   Procedure: TRANSESOPHAGEAL ECHOCARDIOGRAM (TEE);  Surgeon: Grace Isaac, MD;  Location: [facility];  Service: Open Heart Surgery;  Laterality: N/A;    Home Medications:  Allergies as of 06/18/2019      Reactions   Metoprolol    Oxycodone Other (See Comments)   Reaction:  Hypotension    Sulfadiazine    Sulfa Antibiotics Rash      Medication List        Accurate as of June 18, 2019 12:15 PM. If you have any questions, ask your nurse or doctor.        STOP taking these medications   tamsulosin 0.4 MG Caps capsule Commonly known as: FLOMAX Stopped by: Abbie Sons, MD     TAKE these medications   aspirin EC 81 MG tablet Take 81 mg by mouth daily.   cetirizine 5 MG tablet Commonly known as: ZYRTEC Take 5 mg by mouth daily.   chlorhexidine 0.12 % solution Commonly known as: PERIDEX Use as directed 15 mLs in the mouth or throat 2 (two) times daily as needed (for gum inflammation).   cyanocobalamin 100 MCG tablet Take 100 mcg by mouth daily.   diphenhydrAMINE 25 mg capsule Commonly known as: BENADRYL Take 25 mg by mouth every 6 (six) hours as needed for itching.   dutasteride 0.5 MG capsule Commonly known as: AVODART Take 1 capsule (0.5 mg total) by mouth daily.   hydrocortisone 2.5 % rectal cream Commonly known as: ANUSOL-HC   hydrocortisone cream 1 % Apply 1 application topically 3 (three) times daily as needed for itching.   ketoconazole 2 % cream Commonly known as: NIZORAL   latanoprost 0.005 % ophthalmic solution Commonly known as: XALATAN Place 1 drop into both eyes at bedtime.   nitroGLYCERIN 0.4 MG SL tablet Commonly known as: NITROSTAT Place 0.4 mg under the tongue every 5 (five) minutes as needed for chest pain.   omeprazole 40 MG capsule Commonly known as: PRILOSEC What changed: Another medication with the same name was removed. Continue taking this medication, and follow the directions you see here. Changed by: Abbie Sons, MD   oxybutynin 5 MG tablet Commonly known as: DITROPAN 1 tab at bedtime and may take in a.m. as needed for voiding symptoms. What changed:   how much to take  how to take this  when to take this  additional instructions   pravastatin 20 MG tablet Commonly known as: PRAVACHOL Take 20 mg by mouth daily.   timolol 0.25 % ophthalmic solution Commonly  known as: BETIMOL Place 1 drop into both eyes daily.   triamcinolone cream 0.1 % Commonly known as: KENALOG Apply 1 application topically 2 (two) times daily as needed (Foot).   V-R MAGNESIUM 250 MG Tabs Generic drug: Magnesium Take 250 mg by mouth daily.       Allergies:  Allergies  Allergen Reactions  . Metoprolol   . Oxycodone Other (See Comments)    Reaction:  Hypotension   . Sulfadiazine   . Sulfa Antibiotics Rash    Family History: Family History  Problem Relation Age of Onset  . Parkinsonism Mother   . Kidney disease Father   . Cancer Father        prostate  . Heart disease Father   . Cancer Paternal Uncle        esophageal  . Cancer Paternal Uncle  Social History:  reports that he has never smoked. He has never used smokeless tobacco. He reports that he does not drink alcohol or use drugs.  ROS: UROLOGY Frequent Urination?: Yes Hard to postpone urination?: Yes Burning/pain with urination?: No Get up at night to urinate?: Yes Leakage of urine?: No Urine stream starts and stops?: No Trouble starting stream?: No Do you have to strain to urinate?: No Blood in urine?: No Urinary tract infection?: No Sexually transmitted disease?: No Injury to kidneys or bladder?: No Painful intercourse?: No Weak stream?: Yes Erection problems?: No Penile pain?: No  Gastrointestinal Nausea?: No Vomiting?: No Indigestion/heartburn?: No Diarrhea?: No Constipation?: No  Constitutional Fever: No Night sweats?: No Weight loss?: No Fatigue?: No  Skin Skin rash/lesions?: No Itching?: No  Eyes Blurred vision?: No Double vision?: No  Ears/Nose/Throat Sore throat?: No Sinus problems?: No  Hematologic/Lymphatic Swollen glands?: No Easy bruising?: Yes  Cardiovascular Leg swelling?: No Chest pain?: No  Respiratory Cough?: No Shortness of breath?: No  Endocrine Excessive thirst?: No  Musculoskeletal Back pain?: No Joint pain?: No   Neurological Headaches?: No Dizziness?: No  Psychologic Depression?: No Anxiety?: No  Physical Exam: BP 126/75 (BP Location: Left Arm, Patient Position: Sitting, Cuff Size: Normal)   Pulse (!) 54   Ht 6\' 2"  (1.88 m)   Wt 220 lb (99.8 kg)   BMI 28.25 kg/m   Constitutional:  Alert and oriented, No acute distress. HEENT: [place] AT, moist mucus membranes.  Trachea midline, no masses. Cardiovascular: No clubbing, cyanosis, or edema. Respiratory: Normal respiratory effort, no increased work of breathing. Skin: No rashes, bruises or suspicious lesions. Neurologic: Grossly intact, no focal deficits, moving all 4 extremities. Psychiatric: Normal mood and affect.   Assessment & Plan:    - BPH with obstruction/lower urinary tract symptoms Urinalysis today was unremarkable.  PVR by bladder scan was 145 mL.  I again discussed surgical options of TURP, PVP and UroLift.  He states he can live with his symptoms at present however is concerned about worsening symptoms in the future.  Stopping his alpha-blocker may have worsened his symptoms.  I recommended discontinuing the oxybutynin and will give a trial of silodosin 8 mg daily.  Rx was sent to pharmacy.  He has a follow-up scheduled in 3 months which she will keep and was instructed to call earlier for worsening symptoms or if he decides to pursue surgical options.   Abbie Sons, [place] [address] [place],  [address] [phone]

## 2019-06-19 ENCOUNTER — Encounter: Payer: Self-pay | Admitting: Urology

## 2019-06-19 LAB — URINALYSIS, COMPLETE
Bilirubin, UA: NEGATIVE
Glucose, UA: NEGATIVE
Ketones, UA: NEGATIVE
Leukocytes,UA: NEGATIVE
Nitrite, UA: NEGATIVE
Protein,UA: NEGATIVE
RBC, UA: NEGATIVE
Specific Gravity, UA: 1.025 (ref 1.005–1.030)
Urobilinogen, Ur: 0.2 mg/dL (ref 0.2–1.0)
pH, UA: 6 (ref 5.0–7.5)

## 2019-06-19 LAB — MICROSCOPIC EXAMINATION
Bacteria, UA: NONE SEEN
Epithelial Cells (non renal): NONE SEEN /hpf (ref 0–10)
RBC, Urine: NONE SEEN /hpf (ref 0–2)

## 2019-07-15 IMAGING — CR CHEST - 2 VIEW
2 series · 2 of 2 positions shown · non-contrast
Comparison: October 19, 2015

CLINICAL DATA: Chest pain

EXAM:
CHEST - 2 VIEW

[chest pa]
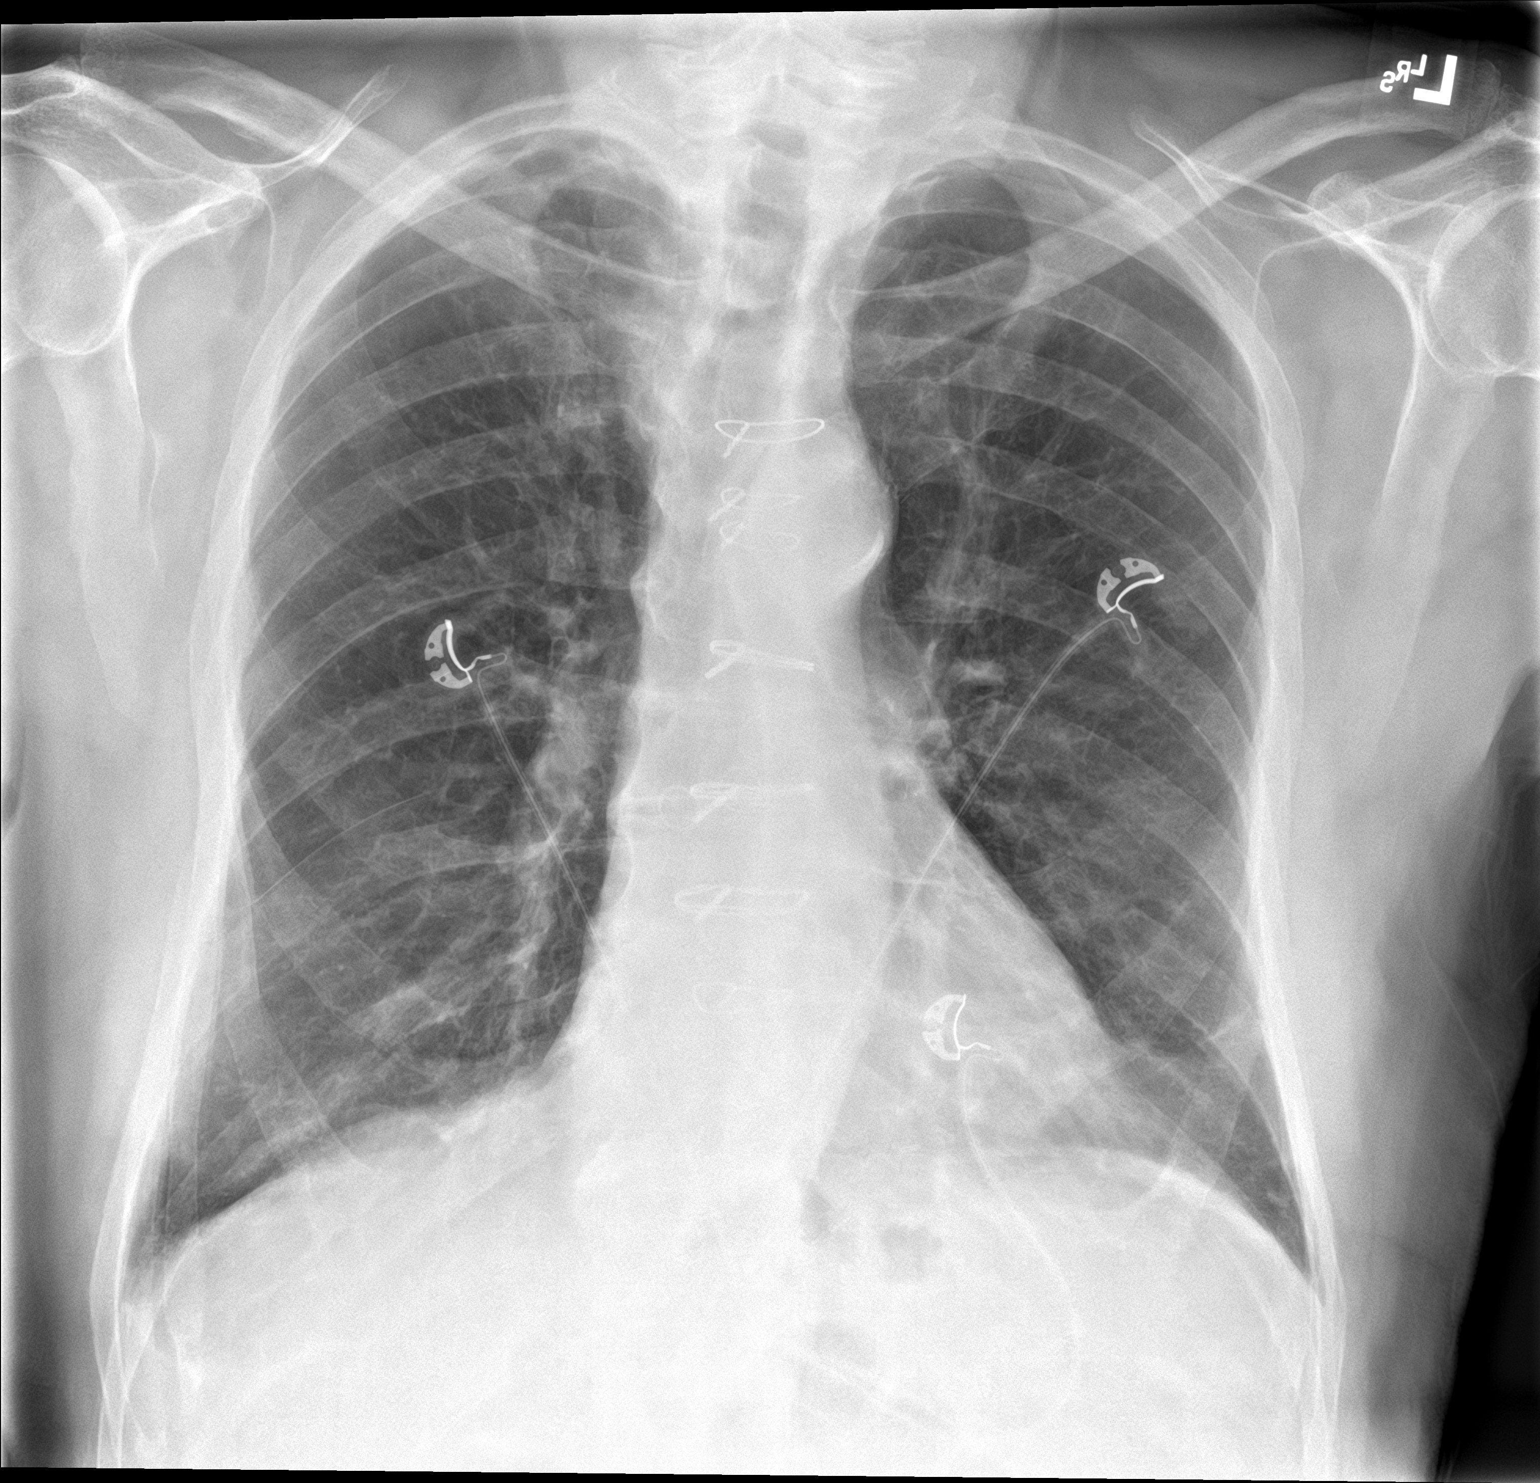

[chest lat]
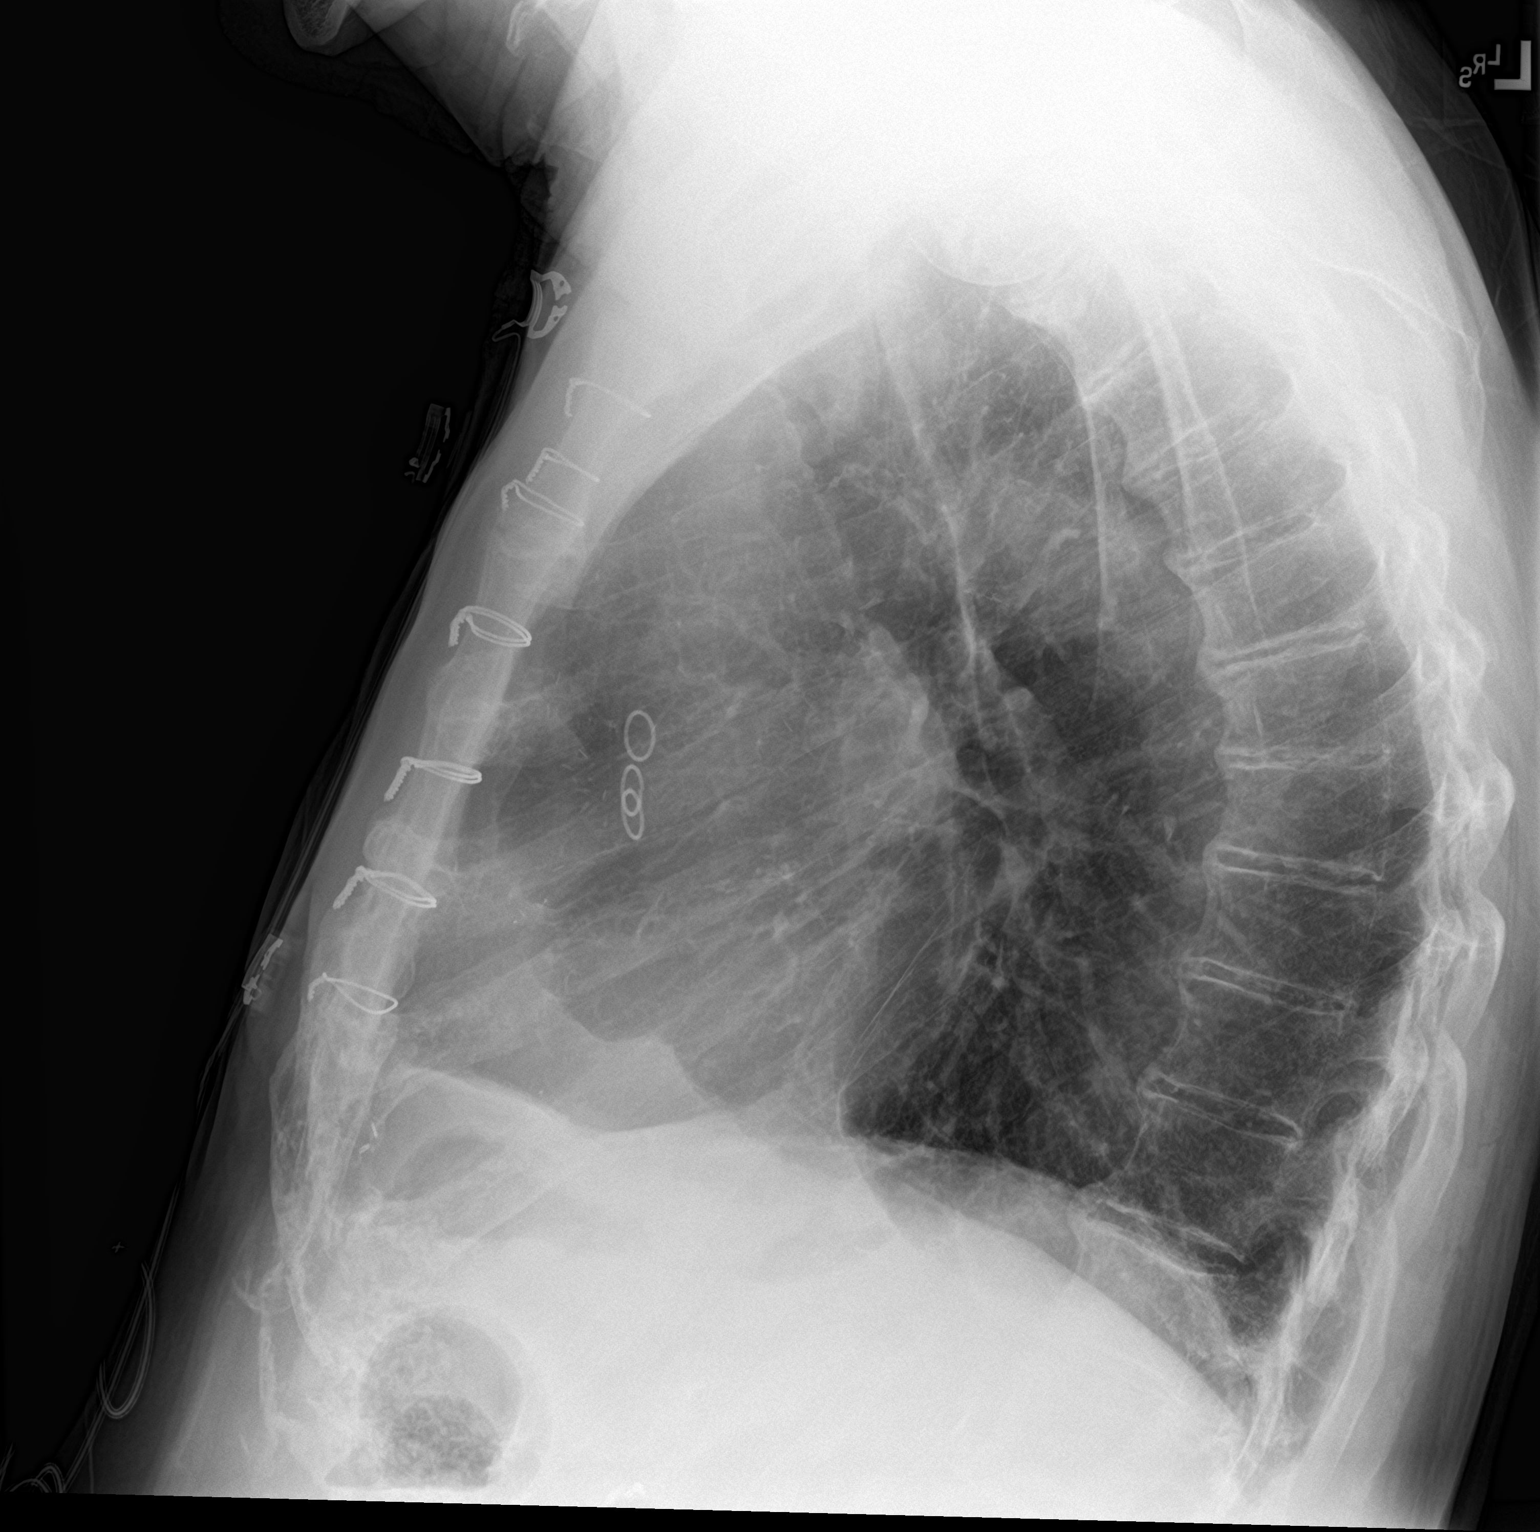

[2 of 2 positions shown; findings below may reference images not displayed]

FINDINGS: There is slight bibasilar atelectasis. No edema or consolidation.
Heart size and pulmonary vascularity normal. Patient is status post
coronary artery bypass grafting. There is aortic atherosclerosis.
There is degenerative change in the thoracic spine.
IMPRESSION: Slight bibasilar atelectasis. No edema or consolidation. Heart size
within normal limits. Aortic Atherosclerosis ([id number]).

## 2019-08-05 IMAGING — MR MRI HEAD WITHOUT AND WITH CONTRAST
12 of 13 series · 45 of 48 positions shown · IV contrast (gadavist)
Comparison: 04/03/2018

CLINICAL DATA: Follow-up meningioma

EXAM:
MRI HEAD WITHOUT AND WITH CONTRAST
TECHNIQUE: Multiplanar, multiecho pulse sequences of the brain and surrounding
structures were obtained without and with intravenous contrast.
CONTRAST:  9 cc Gadavist intravenous

[Series 2: ax dwi_tracew · axial · 3.0mm · 0.83mm/px · z∈[-14,+140]mm · 5 of 55 slices shown]
[im 1/55]
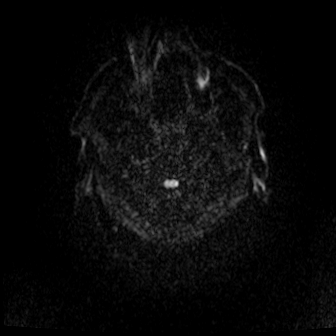
[im 14/55]
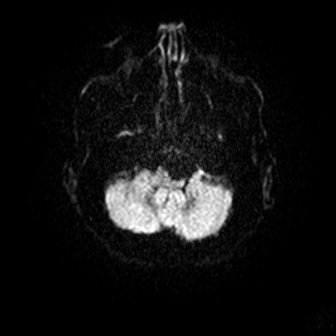
[im 28/55]
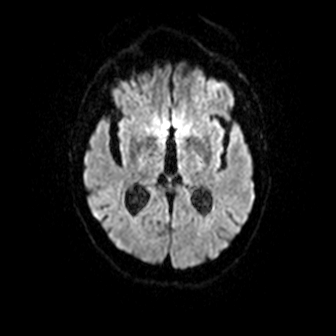
[im 41/55]
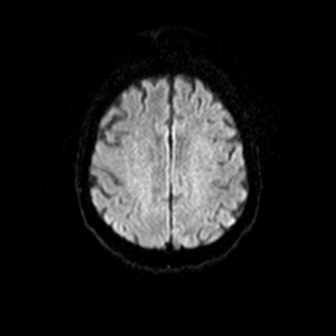
[im 55/55]
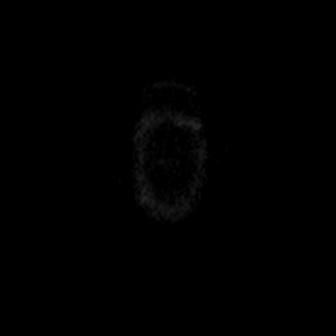

[Series 3: ax dwi_adc · axial · 3.0mm · 0.83mm/px · z∈[-14,+140]mm · 6 of 55 slices shown]
[im 1/55]
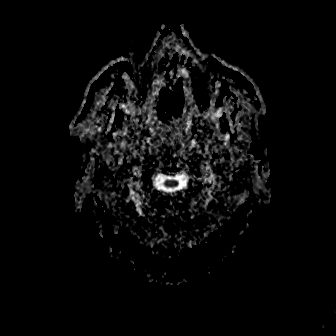
[im 11/55]
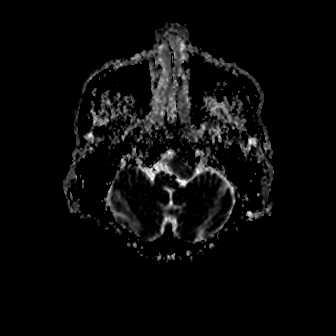
[im 22/55]
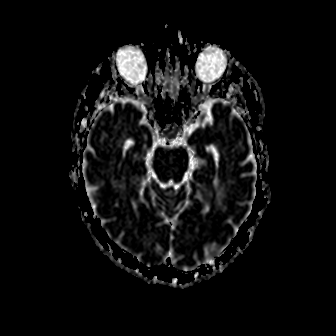
[im 33/55]
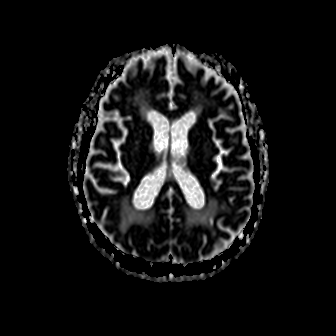
[im 44/55]
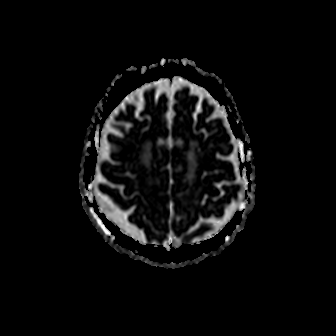
[im 55/55]
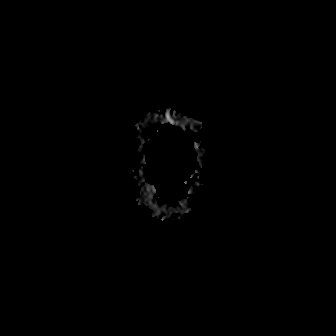

[Series 4: cor dwi_tracew · coronal · 5.0mm · 0.68mm/px · 5 of 45 slices shown]
[im 1/45]
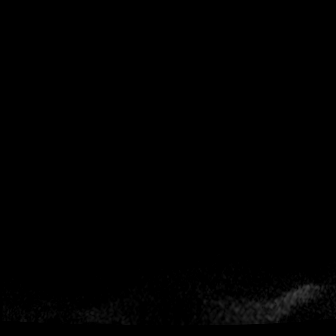
[im 12/45]
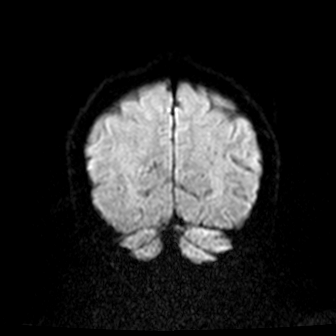
[im 23/45]
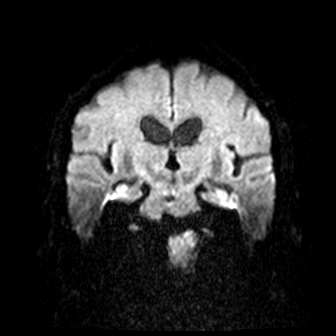
[im 34/45]
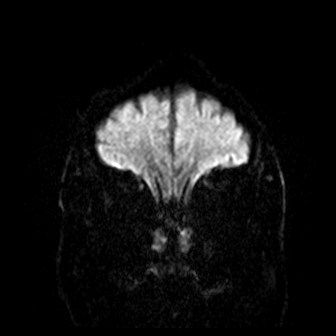
[im 45/45]
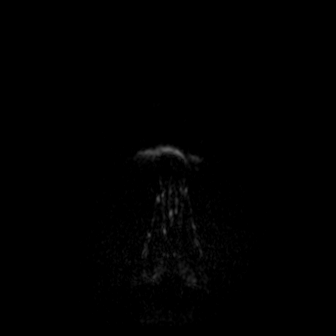

[Series 5: cor dwi_adc · coronal · 5.0mm · 0.68mm/px · 5 of 45 slices shown]
[im 1/45]
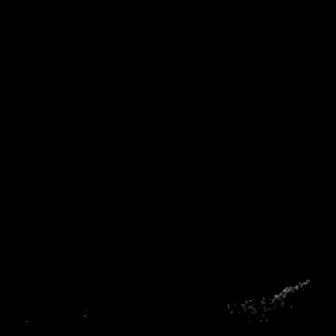
[im 12/45]
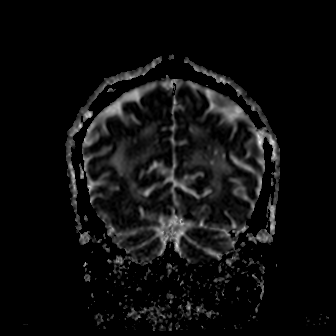
[im 23/45]
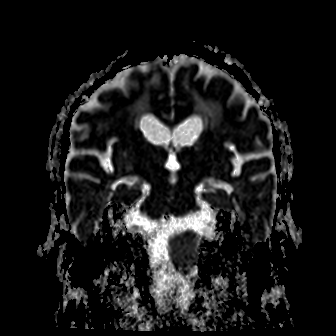
[im 34/45]
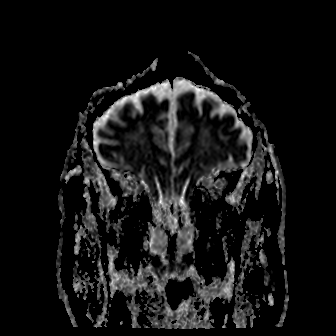
[im 45/45]
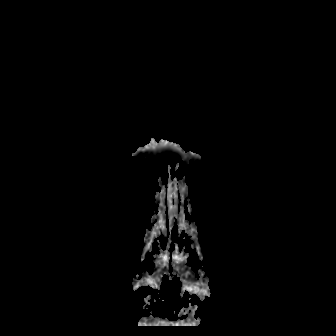

[Series 6: T1 · sagittal · 5.0mm · 0.94mm/px · 3 of 25 slices shown (1 of 2)]
[im 1/25]
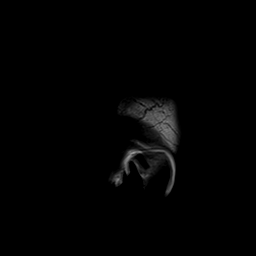
[im 13/25]
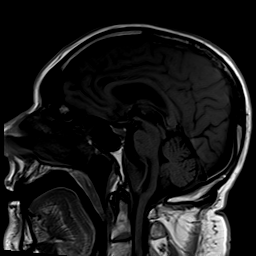
[im 25/25]
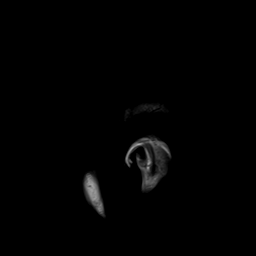

[Series 7: T2 · axial · 5.0mm · 0.45mm/px · z∈[-12,+134]mm · 3 of 27 slices shown (1 of 2)]
[im 1/27]
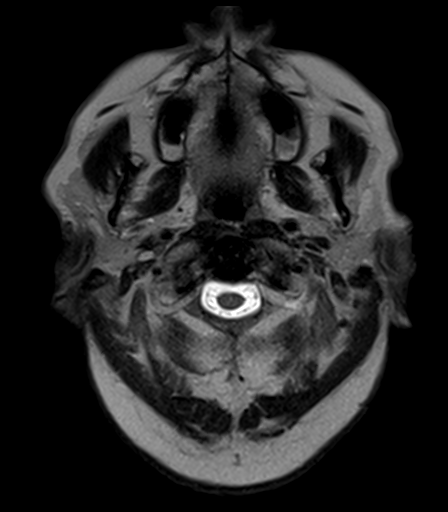
[im 14/27]
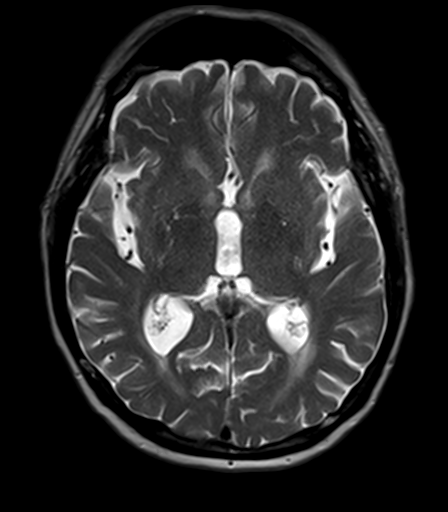
[im 27/27]
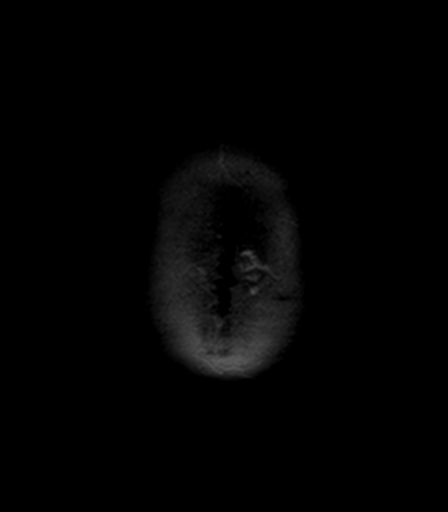

[Series 9: FLAIR · axial · 5.0mm · 1.20mm/px · z∈[-11,+136]mm · 3 of 27 slices shown]
[im 1/27]
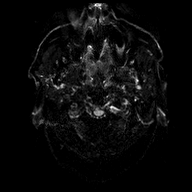
[im 14/27]
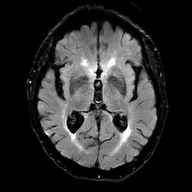
[im 27/27]
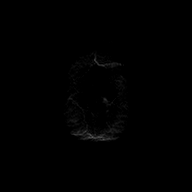

[Series 10: T1 · axial · 5.0mm · 0.90mm/px · z∈[-12,+134]mm · 3 of 27 slices shown (2 of 2)]
[im 1/27]
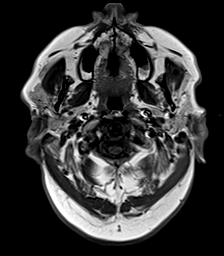
[im 14/27]
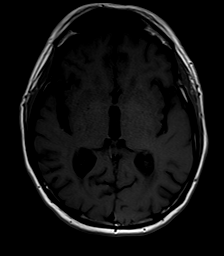
[im 27/27]
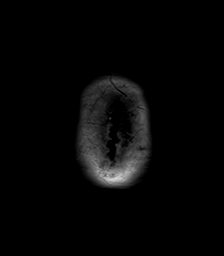

[Series 11: T2 · coronal · 5.0mm · 0.45mm/px · 3 of 31 slices shown (2 of 2)]
[im 1/31]
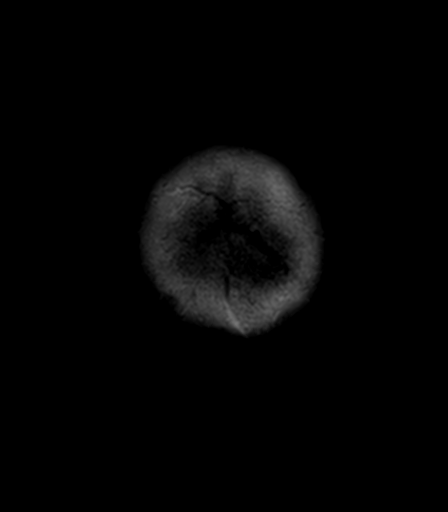
[im 16/31]
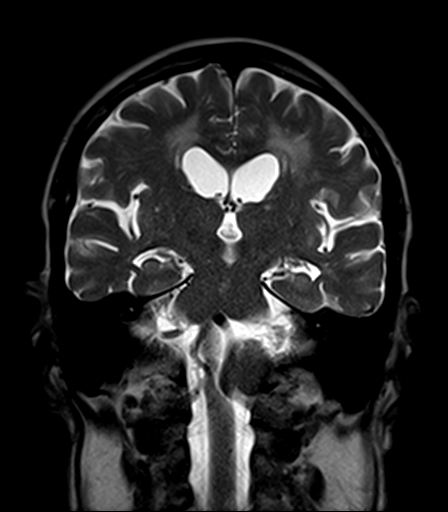
[im 31/31]
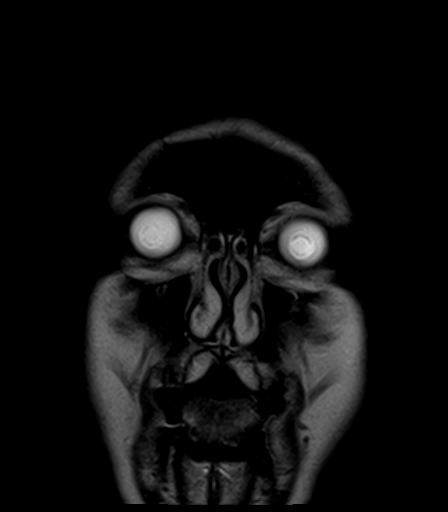

[Series 12: T1 post-contrast · axial · 5.0mm · 0.90mm/px · z∈[-12,+134]mm · 3 of 27 slices shown (1 of 3)]
[im 1/27]
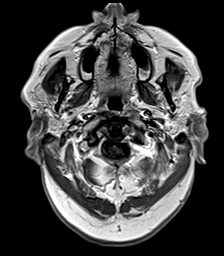
[im 14/27]
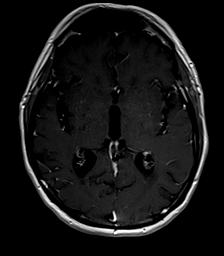
[im 27/27]
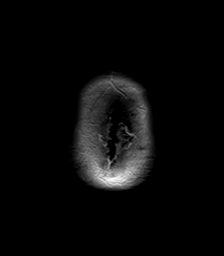

[Series 13: T1 post-contrast · coronal · 5.0mm · 0.90mm/px · 3 of 31 slices shown (2 of 3)]
[im 1/31]
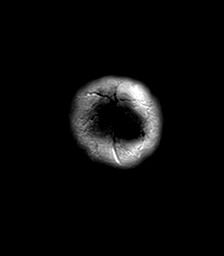
[im 16/31]
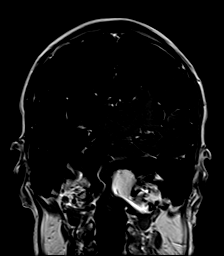
[im 31/31]
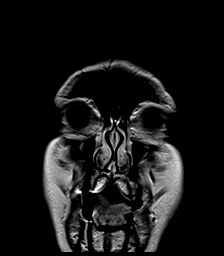

[Series 14: T1 post-contrast · sagittal · 5.0mm · 0.62mm/px · 3 of 25 slices shown (3 of 3)]
[im 1/25]
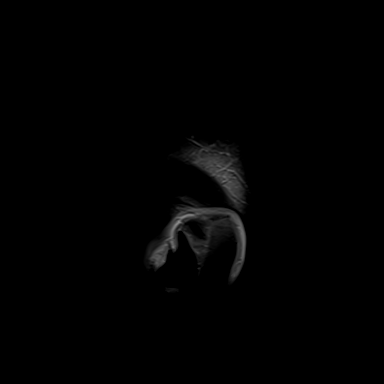
[im 13/25]
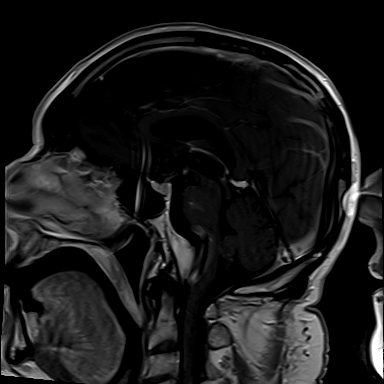
[im 25/25]
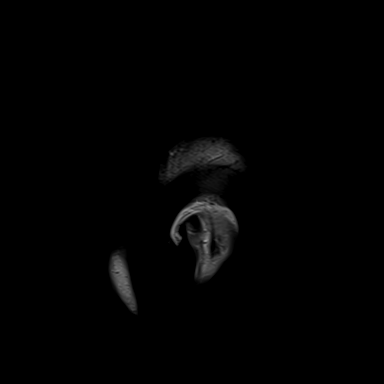

[45 of 48 positions shown; findings below may reference images not displayed]

FINDINGS: Brain: 31 x 21 x 26 mm avidly enhancing mass in the posterior fossa
along the left medulla with left vertebral and medullary
mass-effect. The mass is extra-axial with dural tail and T2
isointense signal. Size is stable when allowing for differences on
prior noncontrast study. As before the mass over rides the left
hypoglossal canal. No tongue atrophy.

Small T2 hyperintensity with gradient signal in the central pons and
adjacent developmental venous anomaly on postcontrast imaging,
consistent with capillary telangiectasia.

Chronic small vessel ischemia with extensive gliosis in the cerebral
white matter. No acute infarct, hemorrhage, hydrocephalus, or
collection.

Vascular: Major flow voids and vascular enhancements are preserved

Skull and upper cervical spine: Negative for marrow lesion

Sinuses/Orbits: Negative
IMPRESSION: 1. 31 x 21 x 26 mm posterior fossa meningioma with left vertebral
and medullary mass effect. No detectable growth since noncontrast
study March 2018.
2. Advanced chronic small vessel ischemia.

## 2019-09-25 ENCOUNTER — Other Ambulatory Visit: Payer: Self-pay | Admitting: Internal Medicine

## 2019-09-25 DIAGNOSIS — R1312 Dysphagia, oropharyngeal phase: Secondary | ICD-10-CM | POA: Insufficient documentation

## 2019-09-25 DIAGNOSIS — R1314 Dysphagia, pharyngoesophageal phase: Secondary | ICD-10-CM

## 2019-09-25 DIAGNOSIS — D126 Benign neoplasm of colon, unspecified: Secondary | ICD-10-CM | POA: Insufficient documentation

## 2019-09-25 DIAGNOSIS — K921 Melena: Secondary | ICD-10-CM | POA: Insufficient documentation

## 2019-10-01 ENCOUNTER — Ambulatory Visit: Payer: Medicare Other

## 2019-10-03 ENCOUNTER — Other Ambulatory Visit: Payer: Self-pay

## 2019-10-03 ENCOUNTER — Ambulatory Visit
Admission: RE | Admit: 2019-10-03 | Discharge: 2019-10-03 | Disposition: A | Discharge status: Home / Self Care | Payer: Medicare Other | Source: Ambulatory Visit | Attending: Internal Medicine | Admitting: Internal Medicine

## 2019-10-03 DIAGNOSIS — R1314 Dysphagia, pharyngoesophageal phase: Secondary | ICD-10-CM | POA: Diagnosis present

## 2019-10-10 ENCOUNTER — Ambulatory Visit (INDEPENDENT_AMBULATORY_CARE_PROVIDER_SITE_OTHER): Payer: Medicare Other | Admitting: Urology

## 2019-10-10 ENCOUNTER — Other Ambulatory Visit: Payer: Self-pay

## 2019-10-10 VITALS — BP 130/70 | HR 74 | Ht 75.0 in | Wt 220.0 lb

## 2019-10-10 DIAGNOSIS — N401 Enlarged prostate with lower urinary tract symptoms: Secondary | ICD-10-CM

## 2019-10-10 DIAGNOSIS — N138 Other obstructive and reflux uropathy: Secondary | ICD-10-CM | POA: Diagnosis not present

## 2019-10-10 LAB — BLADDER SCAN AMB NON-IMAGING: Scan Result: 42

## 2019-10-10 NOTE — Progress Notes (Signed)
10/10/19 2:48 PM   Bradley [name] 07/12/1941 [id number]  Referring provider: Sofie Hartigan, MD Bradley [name],  [place] [address]  Chief Complaint  Patient presents with  . Benign Prostatic Hypertrophy    HPI: 79 yo M who returns today for a 6 month f/u for the evaluation and management of BPH with LUTS.   -Cystoscopy on 04/16/2019, showed adenoma regrowth mid to distal prostate -reports of urgency and urge incontinence  -reports of right testicular pain onset 2-3 days ago  -PVR 143 mL last visit so switched oxybutynin to silodosin 8 mg  -currently on dutasteride and silodosin 8 mg daily  -reports of silodosin working but not as effective as oxybutynin  PMH: Past Medical History:  Diagnosis Date  . Allergy   . Barrett's esophagus   . Blockage of coronary artery of heart (Bradley [name])   . BPH (benign prostatic hyperplasia)   . Cataracts, bilateral   . Chronic diastolic CHF (congestive heart failure), NYHA class 2 (Bradley [name]) 12/16/2014  . Colon polyp   . Coronary artery disease   . DDD (degenerative disc disease), lumbosacral 12/05/2017  . Essential hypertension 06/05/2014  . GERD (gastroesophageal reflux disease)    Barretts esophagus  . GI bleed    a. ~2012 around time of hiatal hernia surgery. 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  . Glaucoma   . Hemorrhoids   . History of chicken pox   . History of hiatal hernia 2012  . Hyperlipidemia   . Meningioma ([place])    stable, followed by yearly MRIs  . Migraines   . RBBB   . Stomach ulcer     Surgical History: Past Surgical History:  Procedure Laterality Date  . ATHERECTOMY  1992  . CARDIAC CATHETERIZATION N/A 01/07/2015   Procedure: Left Heart Cath;  Surgeon: Corey Skains, MD;  Location: [facility];  Service: Cardiovascular;  Laterality: N/A;  . CARDIAC SURGERY     coronary angioplasty  . CHOLECYSTECTOMY    . COLONOSCOPY  11/03/08, 03/09/14  . CORONARY ANGIOPLASTY    . CORONARY  ARTERY BYPASS GRAFT N/A 01/19/2015   Procedure: CORONARY ARTERY BYPASS GRAFTING (CABG), with LIM to LAD, vein to left intermediate, vein to PD, vein to OM, using right greater saphenous vein via endovein harvest.;  Surgeon: Bradley Isaac, MD;  Location: [facility];  Service: Open Heart Surgery;  Laterality: N/A;  . DIAGNOSTIC LAPAROSCOPY     cholecystectomy and hiatal hernia repair  . ENDOVEIN HARVEST OF GREATER SAPHENOUS VEIN Right 01/19/2015   Procedure: ENDOVEIN HARVEST OF GREATER SAPHENOUS VEIN;  Surgeon: Bradley Isaac, MD;  Location: [facility];  Service: Open Heart Surgery;  Laterality: Right;  . ESOPHAGOGASTRODUODENOSCOPY  02/19/13  . ESOPHAGOGASTRODUODENOSCOPY (EGD) WITH PROPOFOL N/A 08/09/2015   Procedure: ESOPHAGOGASTRODUODENOSCOPY (EGD) WITH PROPOFOL;  Surgeon: Bradley Luster, MD;  Location: [facility]COPY;  Service: Gastroenterology;  Laterality: N/A;  . ESOPHAGOGASTRODUODENOSCOPY (EGD) WITH PROPOFOL N/A 04/13/2017   Procedure: ESOPHAGOGASTRODUODENOSCOPY (EGD) WITH PROPOFOL;  Surgeon: [name], Bradley Pike, MD;  Location: [facility] ENDOSCOPY;  Service: Endoscopy;  Laterality: N/A;  . ESOPHAGUS SURGERY    . EYE SURGERY     cataracts BIL  . HERNIA REPAIR    . HIATAL HERNIA REPAIR N/A   . POLYPECTOMY    . TEE WITHOUT CARDIOVERSION N/A 01/19/2015   Procedure: TRANSESOPHAGEAL ECHOCARDIOGRAM (TEE);  Surgeon: Bradley Isaac, MD;  Location: [facility];  Service: Open Heart Surgery;  Laterality: N/A;  Home Medications:  Allergies as of 10/10/2019      Reactions   Metoprolol    Oxycodone Other (See Comments)   Reaction:  Hypotension    Sulfadiazine    Sulfa Antibiotics Rash      Medication List       Accurate as of October 10, 2019  2:48 PM. If you have any questions, ask your nurse or doctor.        aspirin EC 81 MG tablet Take 81 mg by mouth daily.   cetirizine 5 MG tablet Commonly known as: ZYRTEC Take 5 mg by mouth daily.   chlorhexidine 0.12 % solution Commonly known as: PERIDEX Use as  directed 15 mLs in the mouth or throat 2 (two) times daily as needed (for gum inflammation).   cyanocobalamin 100 MCG tablet Take 100 mcg by mouth daily.   diphenhydrAMINE 25 mg capsule Commonly known as: BENADRYL Take 25 mg by mouth every 6 (six) hours as needed for itching.   dutasteride 0.5 MG capsule Commonly known as: AVODART Take 1 capsule (0.5 mg total) by mouth daily.   hydrocortisone 2.5 % rectal cream Commonly known as: ANUSOL-HC   hydrocortisone cream 1 % Apply 1 application topically 3 (three) times daily as needed for itching.   ketoconazole 2 % cream Commonly known as: NIZORAL   latanoprost 0.005 % ophthalmic solution Commonly known as: XALATAN Place 1 drop into both eyes at bedtime.   nitroGLYCERIN 0.4 MG SL tablet Commonly known as: NITROSTAT Place 0.4 mg under the tongue every 5 (five) minutes as needed for chest pain.   omeprazole 40 MG capsule Commonly known as: PRILOSEC   pravastatin 20 MG tablet Commonly known as: PRAVACHOL Take 20 mg by mouth daily.   silodosin 8 MG Caps capsule Commonly known as: RAPAFLO Take 1 capsule (8 mg total) by mouth daily with breakfast.   timolol 0.25 % ophthalmic solution Commonly known as: BETIMOL Place 1 drop into both eyes daily.   triamcinolone cream 0.1 % Commonly known as: KENALOG Apply 1 application topically 2 (two) times daily as needed (Foot).   V-R MAGNESIUM 250 MG Tabs Generic drug: Magnesium Take 250 mg by mouth daily.       Allergies:  Allergies  Allergen Reactions  . Metoprolol   . Oxycodone Other (See Comments)    Reaction:  Hypotension   . Sulfadiazine   . Sulfa Antibiotics Rash    Family History: Family History  Problem Relation Age of Onset  . Parkinsonism Mother   . Kidney disease Father   . Cancer Father        prostate  . Heart disease Father   . Cancer Paternal Uncle        esophageal  . Cancer Paternal Uncle     Social History:  reports that he has never smoked. He  has never used smokeless tobacco. He reports that he does not drink alcohol or use drugs.   Physical Exam: BP 130/70   Pulse 74   Ht 6\' 3"  (1.905 m)   Wt 220 lb (99.8 kg)   BMI 27.50 kg/m   Constitutional:  Alert and oriented, No acute distress. HEENT: [place] AT, moist mucus membranes.  Trachea midline, no masses. Cardiovascular: No clubbing, cyanosis, or edema. Respiratory: Normal respiratory effort, no increased work of breathing. GU: No CVA tenderness. Minimal tenderness of right epididymus and no masses.  Skin: No rashes, bruises or suspicious lesions. Neurologic: Grossly intact, no focal deficits, moving all 4 extremities. Psychiatric:  Normal mood and affect.  Pertinent Imaging: Results for orders placed or performed in visit on 10/10/19  Bladder Scan (Post Void Residual) in office  Result Value Ref Range   Scan Result 42     Assessment & Plan:    1. BPH with LUTS  Improved 42 mL. Will discontinue silodosin 8 mg and trial Myrbetriq 25 mg he will call regarding efficacy We discussed alternatives including TURP vs. holmium laser enucleation of the prostate vs. Urolift. Differences between the surgical procedures were discussed as well as the risks and benefits of each.  Will consider surgical alternatives if medication management fails  Given samples of Myrbetriq 25 mg  F/u 6 months with PVR    [facility] [address], [place], [place] [address] [phone]  I, Lucas Mallow, am acting as a scribe for Dr. Nicki Reaper C. Yusuf Yu,  I have reviewed the above documentation for accuracy and completeness, and I agree with the above.   Abbie Sons, MD

## 2019-10-11 ENCOUNTER — Encounter: Payer: Self-pay | Admitting: Urology

## 2019-10-11 MED ORDER — MIRABEGRON ER 25 MG PO TB24: 25.0000 mg | ORAL_TABLET | Freq: Every day | ORAL | 2 refills | Status: DC

## 2019-10-11 MED ORDER — DUTASTERIDE 0.5 MG PO CAPS: 0.5000 mg | ORAL_CAPSULE | Freq: Every day | ORAL | 3 refills | Status: DC

## 2019-10-17 ENCOUNTER — Ambulatory Visit: Payer: Medicare Other | Admitting: Urology

## 2019-10-20 ENCOUNTER — Ambulatory Visit: Payer: Medicare Other | Admitting: Urology

## 2019-11-10 ENCOUNTER — Telehealth: Payer: Self-pay

## 2019-11-10 NOTE — Telephone Encounter (Signed)
Patient called and is taking myrbetriq samples. Patient has been taking it for one month and does not feel like it was helping. Patient was on Oxybutnin and would like to refill that instead.

## 2019-11-11 MED ORDER — OXYBUTYNIN CHLORIDE ER 5 MG PO TB24: 10.0000 mg | ORAL_TABLET | Freq: Every day | ORAL | 3 refills | Status: DC

## 2019-11-11 NOTE — Telephone Encounter (Signed)
He was previously on an immediate release oxybutynin.  Rx for extended release oxybutynin was sent to pharmacy that he only needs to take once daily.  Discontinue the Myrbetriq.  Continue dutasteride and silodosin

## 2019-12-02 NOTE — Progress Notes (Signed)
Virtual Visit via Telephone Note  I connected with [name] on 12/05/19 at 11:30 AM EDT by telephone and verified that I am speaking with the correct person using two identifiers.  Location: Patient: Home Provider: [facility]   I discussed the limitations, risks, security and privacy concerns of performing an evaluation and management service by telephone and the availability of in person appointments. I also discussed with the patient that there may be a patient responsible charge related to this service. The patient expressed understanding and agreed to proceed.  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 male was last seen March 2021.  He was not due for follow-up until September however requested an appointment to discuss medications and surgical options.  He was given samples of Myrbetriq at the last visit for storage elated voiding symptoms and call back in late April requesting to go back on oxybutynin as he thought the medication was more effective.  He is currently on extended release oxybutynin 5 mg daily.  He states he is currently satisfied with his voiding pattern however he is "concerned his symptoms may worsen in the future".  Denies dysuria, gross hematuria.  No flank, abdominal or pelvic pain.   Observations/Objective: Alert, answers questions appropriately  Assessment and Plan:  -BPH with LUTS Discussed there is no way to predict if his voiding symptoms will worsen and I recommended continued monitoring with periodic follow-ups.  We discussed potential surgical options if he desired including  TURP, PVP and UroLift.  Follow Up Instructions: He has an appointment scheduled September 2021   I discussed the assessment and treatment plan with the patient. The patient was provided an opportunity to ask questions and all were answered. The patient agreed with the plan and demonstrated an understanding of the instructions.   The patient was advised to call back or seek an in-person evaluation if the symptoms worsen or if the condition fails to improve as anticipated.  I provided 15 minutes of non-face-to-face time during this encounter.   Abbie Sons, MD

## 2019-12-03 ENCOUNTER — Other Ambulatory Visit: Payer: Self-pay

## 2019-12-03 ENCOUNTER — Telehealth (INDEPENDENT_AMBULATORY_CARE_PROVIDER_SITE_OTHER): Payer: Medicare Other | Admitting: Urology

## 2019-12-03 DIAGNOSIS — N401 Enlarged prostate with lower urinary tract symptoms: Secondary | ICD-10-CM

## 2019-12-03 DIAGNOSIS — N138 Other obstructive and reflux uropathy: Secondary | ICD-10-CM

## 2019-12-05 ENCOUNTER — Encounter: Payer: Self-pay | Admitting: Urology

## 2020-01-01 ENCOUNTER — Other Ambulatory Visit: Payer: Self-pay | Admitting: Radiology

## 2020-01-01 ENCOUNTER — Encounter: Payer: Self-pay | Admitting: Urology

## 2020-01-01 ENCOUNTER — Other Ambulatory Visit: Payer: Self-pay

## 2020-01-01 ENCOUNTER — Ambulatory Visit (INDEPENDENT_AMBULATORY_CARE_PROVIDER_SITE_OTHER): Payer: Medicare Other | Admitting: Urology

## 2020-01-01 VITALS — BP 143/81 | HR 59 | Ht 74.0 in | Wt 229.0 lb

## 2020-01-01 DIAGNOSIS — N138 Other obstructive and reflux uropathy: Secondary | ICD-10-CM

## 2020-01-01 DIAGNOSIS — N401 Enlarged prostate with lower urinary tract symptoms: Secondary | ICD-10-CM

## 2020-01-01 LAB — BLADDER SCAN AMB NON-IMAGING: Scan Result: 49

## 2020-01-01 NOTE — Progress Notes (Signed)
01/01/2020 2:41 PM   Bradley [name] [birth year] [id number]  Referring provider: Sofie Hartigan, MD [place],  [place] [address]  Chief Complaint  Patient presents with  . 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  -Telehealth visit 12/03/2019 -Complain of storage elated voiding symptoms and felt oxybutynin was more effective than Myrbetriq -Was placed back on oxybutynin however had to discontinue due to dry mouth -States voiding symptoms worsened with stopping oxybutynin but the symptoms are obstructive in nature with decreased force and caliber of his urinary stream -Most bothersome symptoms are urgency and a weak urinary stream     PMH: Past Medical History:  Diagnosis Date  . Allergy   . Barrett's esophagus   . Blockage of coronary artery of heart ([place])   . BPH (benign prostatic hyperplasia)   . Cataracts, bilateral   . Chronic diastolic CHF (congestive heart failure), NYHA class 2 ([place]) 12/16/2014  . Colon polyp   . Coronary artery disease   . DDD (degenerative disc disease), lumbosacral 12/05/2017  . Essential hypertension 06/05/2014  . GERD (gastroesophageal reflux disease)    Barretts esophagus  . GI bleed    a. ~2012 around time of hiatal hernia surgery. 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  . Glaucoma   . Hemorrhoids   . History of chicken pox   . History of hiatal hernia 2012  . Hyperlipidemia   . Meningioma ([place])    stable, followed by yearly MRIs  . Migraines   . RBBB   . Stomach ulcer      Surgical History: Past Surgical History:  Procedure Laterality Date  . ATHERECTOMY  1992  . CARDIAC CATHETERIZATION N/A 01/07/2015   Procedure: Left Heart Cath;  Surgeon: Corey Skains, MD;  Location: [facility];  Service: Cardiovascular;  Laterality: N/A;  . CARDIAC SURGERY     coronary angioplasty  . CHOLECYSTECTOMY    . COLONOSCOPY  11/03/08, 03/09/14  . CORONARY ANGIOPLASTY    . CORONARY ARTERY BYPASS GRAFT N/A 01/19/2015   Procedure: CORONARY ARTERY BYPASS GRAFTING (CABG), with LIM to LAD, vein to left intermediate, vein to PD, vein to OM, using right greater saphenous vein via endovein harvest.;  Surgeon: Grace Isaac, MD;  Location: [facility];  Service: Open Heart Surgery;  Laterality: N/A;  . DIAGNOSTIC LAPAROSCOPY     cholecystectomy and hiatal hernia repair  . ENDOVEIN HARVEST OF GREATER SAPHENOUS VEIN Right 01/19/2015   Procedure: ENDOVEIN HARVEST OF GREATER SAPHENOUS VEIN;  Surgeon: Grace Isaac, MD;  Location: [facility];  Service: Open Heart Surgery;  Laterality: Right;  . ESOPHAGOGASTRODUODENOSCOPY  02/19/13  . ESOPHAGOGASTRODUODENOSCOPY (EGD) WITH PROPOFOL N/A 08/09/2015   Procedure: ESOPHAGOGASTRODUODENOSCOPY (EGD) WITH PROPOFOL;  Surgeon: Hulen Luster, MD;  Location: [facility] ENDOSCOPY;  Service: Gastroenterology;  Laterality: N/A;  . ESOPHAGOGASTRODUODENOSCOPY (EGD) WITH PROPOFOL N/A 04/13/2017   Procedure: ESOPHAGOGASTRODUODENOSCOPY (EGD) WITH PROPOFOL;  Surgeon: Toledo, Benay Pike, MD;  Location: [facility] ENDOSCOPY;  Service: Endoscopy;  Laterality: N/A;  . ESOPHAGUS SURGERY    . EYE SURGERY     cataracts BIL  . HERNIA REPAIR    . HIATAL HERNIA REPAIR N/A   .  POLYPECTOMY    . TEE WITHOUT CARDIOVERSION N/A 01/19/2015   Procedure: TRANSESOPHAGEAL ECHOCARDIOGRAM (TEE);  Surgeon: Grace Isaac, MD;  Location: [facility];  Service: Open Heart Surgery;  Laterality: N/A;    Home Medications:  Allergies as of 01/01/2020      Reactions   Metoprolol Other (See Comments)    Hypotension   Oxycodone Other (See Comments)   Reaction:  Hypotension    Sulfadiazine Other (See Comments)   unknown   Sulfa Antibiotics Rash      Medication List       Accurate as of January 01, 2020  2:41 PM. If you have any questions, ask your nurse or doctor.        STOP taking these medications   mirabegron ER 25 MG Tb24 tablet Commonly known as: MYRBETRIQ Stopped by: Abbie Sons, MD   oxybutynin 5 MG 24 hr tablet Commonly known as: DITROPAN-XL Stopped by: Abbie Sons, MD     TAKE these medications   acetaminophen 500 MG tablet Commonly known as: TYLENOL Take 500 mg by mouth every 6 (six) hours as needed for mild pain or moderate pain.   aspirin EC 81 MG tablet Take 81 mg by mouth daily.   cetirizine 10 MG tablet Commonly known as: ZYRTEC Take 10 mg by mouth daily.   chlorhexidine 0.12 % solution Commonly known as: PERIDEX Use as directed 15 mLs in the mouth or throat 2 (two) times daily as needed (for gum inflammation).   cyanocobalamin 2000 MCG tablet Take 2,000 mcg by mouth daily.   diphenhydrAMINE 25 mg capsule Commonly known as: BENADRYL Take 25 mg by mouth every 6 (six) hours as needed for itching.   dutasteride 0.5 MG capsule Commonly known as: AVODART Take 1 capsule (0.5 mg total) by mouth daily.   eucerin cream Apply 1 application topically daily as needed for dry skin.   hydrocortisone 2.5 % rectal cream Commonly known as: ANUSOL-HC Place 1 application rectally daily as needed for hemorrhoids.   hydrocortisone cream 1 % Apply 1 application topically 3 (three) times daily as needed for itching.   latanoprost 0.005 % ophthalmic solution Commonly known as: XALATAN Place 1 drop into both eyes at bedtime.   Omeprazole 20 MG Tbdd Take 20 mg by mouth daily.   pravastatin 20 MG tablet Commonly known as: PRAVACHOL Take 20 mg by mouth daily.   timolol 0.25 % ophthalmic solution Commonly known as: BETIMOL Place 1 drop into both eyes  daily.   triamcinolone cream 0.1 % Commonly known as: KENALOG Apply 1 application topically 2 (two) times daily as needed (Foot).   V-R MAGNESIUM 250 MG Tabs Generic drug: Magnesium Take 250 mg by mouth daily.       Allergies:  Allergies  Allergen Reactions  . Metoprolol Other (See Comments)    Hypotension  . Oxycodone Other (See Comments)    Reaction:  Hypotension   . Sulfadiazine Other (See Comments)    unknown  . Sulfa Antibiotics Rash    Family History: Family History  Problem Relation Age of Onset  . Parkinsonism Mother   . Kidney disease Father   . Cancer Father        prostate  . Heart disease Father   . Cancer Paternal Uncle        esophageal  . Cancer Paternal Uncle     Social History:  reports that he has never smoked. He has never used smokeless tobacco. He reports that he does  not drink alcohol and does not use drugs.   Physical Exam: BP (!) 143/81   Pulse (!) 59   Ht 6\' 2"  (1.88 m)   Wt 229 lb (103.9 kg)   BMI 29.40 kg/m   Constitutional:  Alert and oriented, No acute distress. HEENT: [place] AT, moist mucus membranes.  Trachea midline, no masses. Cardiovascular: No clubbing, cyanosis, or edema. Respiratory: Normal respiratory effort, no increased work of breathing. Skin: No rashes, bruises or suspicious lesions. Neurologic: Grossly intact, no focal deficits, moving all 4 extremities. Psychiatric: Normal mood and affect.   Assessment & Plan:    1. BPH with moderate LUTS He is interested in pursuing an outlet procedure and was particularly interested in UroLift.  The procedure was discussed in detail.  He has not had a TRUS prostate.  A renal ultrasound performed in 2019 measured the prostate dimensions and using the prostate volume calculator estimated volume is 131 cc.  His cystoscopy was felt only showed moderate lateral lobe enlargement at the mid and apical regions.  Recommend scheduling TRUS prostate for volume to determine suitability for  UroLift.  PVR by bladder scan was 49 mL   Abbie Sons, MD  [facility] [address], [place],  [phone]

## 2020-01-01 NOTE — H&P (View-Only) (Signed)
01/01/2020 2:41 PM   Bradley [name] December 27, 1940 [id number]  Referring provider: Sofie Hartigan, MD [place],   [id number]  Chief Complaint  Patient presents with  . 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  -Telehealth visit 12/03/2019 -Complain of storage elated voiding symptoms and felt oxybutynin was more effective than Myrbetriq -Was placed back on oxybutynin however had to discontinue due to dry mouth -States voiding symptoms worsened with stopping oxybutynin but the symptoms are obstructive in nature with decreased force and caliber of his urinary stream -Most bothersome symptoms are urgency and a weak urinary stream     PMH: Past Medical History:  Diagnosis Date  . Allergy   . Barrett's esophagus   . Blockage of coronary artery of heart ([place])   . BPH (benign prostatic hyperplasia)   . Cataracts, bilateral   . Chronic diastolic CHF (congestive heart failure), NYHA class 2 ([place]) 12/16/2014  . Colon polyp   . Coronary artery disease   . DDD (degenerative disc disease), lumbosacral 12/05/2017  . Essential hypertension 06/05/2014  . GERD (gastroesophageal reflux disease)    Barretts esophagus  . GI bleed    a. ~2012 around time of hiatal hernia surgery. 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  . Glaucoma   . Hemorrhoids   . History of chicken pox   . History of hiatal hernia 2012  . Hyperlipidemia   . Meningioma ([place])    stable, followed by yearly MRIs  . Migraines   . RBBB   . Stomach ulcer      Surgical History: Past Surgical History:  Procedure Laterality Date  . ATHERECTOMY  1992  . CARDIAC CATHETERIZATION N/A 01/07/2015   Procedure: Left Heart Cath;  Surgeon: Corey Skains, MD;  Location: [facility];  Service: Cardiovascular;  Laterality: N/A;  . CARDIAC SURGERY     coronary angioplasty  . CHOLECYSTECTOMY    . COLONOSCOPY  11/03/08, 03/09/14  . CORONARY ANGIOPLASTY    . CORONARY ARTERY BYPASS GRAFT N/A 01/19/2015   Procedure: CORONARY ARTERY BYPASS GRAFTING (CABG), with LIM to LAD, vein to left intermediate, vein to PD, vein to OM, using right greater saphenous vein via endovein harvest.;  Surgeon: Grace Isaac, MD;  Location: [facility];  Service: Open Heart Surgery;  Laterality: N/A;  . DIAGNOSTIC LAPAROSCOPY     cholecystectomy and hiatal hernia repair  . ENDOVEIN HARVEST OF GREATER SAPHENOUS VEIN Right 01/19/2015   Procedure: ENDOVEIN HARVEST OF GREATER SAPHENOUS VEIN;  Surgeon: Grace Isaac, MD;  Location: [facility];  Service: Open Heart Surgery;  Laterality: Right;  . ESOPHAGOGASTRODUODENOSCOPY  02/19/13  . ESOPHAGOGASTRODUODENOSCOPY (EGD) WITH PROPOFOL N/A 08/09/2015   Procedure: ESOPHAGOGASTRODUODENOSCOPY (EGD) WITH PROPOFOL;  Surgeon: Hulen Luster, MD;  Location: [facility] ENDOSCOPY;  Service: Gastroenterology;  Laterality: N/A;  . ESOPHAGOGASTRODUODENOSCOPY (EGD) WITH PROPOFOL N/A 04/13/2017   Procedure: ESOPHAGOGASTRODUODENOSCOPY (EGD) WITH PROPOFOL;  Surgeon: Toledo, Benay Pike, MD;  Location: [facility] ENDOSCOPY;  Service: Endoscopy;  Laterality: N/A;  . ESOPHAGUS SURGERY    . EYE SURGERY     cataracts BIL  . HERNIA REPAIR    . HIATAL HERNIA REPAIR N/A   .  POLYPECTOMY    . TEE WITHOUT CARDIOVERSION N/A 01/19/2015   Procedure: TRANSESOPHAGEAL ECHOCARDIOGRAM (TEE);  Surgeon: Grace Isaac, MD;  Location: [facility];  Service: Open Heart Surgery;  Laterality: N/A;    Home Medications:  Allergies as of 01/01/2020      Reactions   Metoprolol Other (See Comments)    Hypotension   Oxycodone Other (See Comments)   Reaction:  Hypotension    Sulfadiazine Other (See Comments)   unknown   Sulfa Antibiotics Rash      Medication List       Accurate as of January 01, 2020  2:41 PM. If you have any questions, ask your nurse or doctor.        STOP taking these medications   mirabegron ER 25 MG Tb24 tablet Commonly known as: MYRBETRIQ Stopped by: Abbie Sons, MD   oxybutynin 5 MG 24 hr tablet Commonly known as: DITROPAN-XL Stopped by: Abbie Sons, MD     TAKE these medications   acetaminophen 500 MG tablet Commonly known as: TYLENOL Take 500 mg by mouth every 6 (six) hours as needed for mild pain or moderate pain.   aspirin EC 81 MG tablet Take 81 mg by mouth daily.   cetirizine 10 MG tablet Commonly known as: ZYRTEC Take 10 mg by mouth daily.   chlorhexidine 0.12 % solution Commonly known as: PERIDEX Use as directed 15 mLs in the mouth or throat 2 (two) times daily as needed (for gum inflammation).   cyanocobalamin 2000 MCG tablet Take 2,000 mcg by mouth daily.   diphenhydrAMINE 25 mg capsule Commonly known as: BENADRYL Take 25 mg by mouth every 6 (six) hours as needed for itching.   dutasteride 0.5 MG capsule Commonly known as: AVODART Take 1 capsule (0.5 mg total) by mouth daily.   eucerin cream Apply 1 application topically daily as needed for dry skin.   hydrocortisone 2.5 % rectal cream Commonly known as: ANUSOL-HC Place 1 application rectally daily as needed for hemorrhoids.   hydrocortisone cream 1 % Apply 1 application topically 3 (three) times daily as needed for itching.   latanoprost 0.005 % ophthalmic solution Commonly known as: XALATAN Place 1 drop into both eyes at bedtime.   Omeprazole 20 MG Tbdd Take 20 mg by mouth daily.   pravastatin 20 MG tablet Commonly known as: PRAVACHOL Take 20 mg by mouth daily.   timolol 0.25 % ophthalmic solution Commonly known as: BETIMOL Place 1 drop into both eyes  daily.   triamcinolone cream 0.1 % Commonly known as: KENALOG Apply 1 application topically 2 (two) times daily as needed (Foot).   V-R MAGNESIUM 250 MG Tabs Generic drug: Magnesium Take 250 mg by mouth daily.       Allergies:  Allergies  Allergen Reactions  . Metoprolol Other (See Comments)    Hypotension  . Oxycodone Other (See Comments)    Reaction:  Hypotension   . Sulfadiazine Other (See Comments)    unknown  . Sulfa Antibiotics Rash    Family History: Family History  Problem Relation Age of Onset  . Parkinsonism Mother   . Kidney disease Father   . Cancer Father        prostate  . Heart disease Father   . Cancer Paternal Uncle        esophageal  . Cancer Paternal Uncle     Social History:  reports that he has never smoked. He has never used smokeless tobacco. He reports that he does  not drink alcohol and does not use drugs.   Physical Exam: BP (!) 143/81   Pulse (!) 59   Ht 6\' 2"  (1.88 m)   Wt 229 lb (103.9 kg)   BMI 29.40 kg/m   Constitutional:  Alert and oriented, No acute distress. HEENT: [place] AT, moist mucus membranes.  Trachea midline, no masses. Cardiovascular: No clubbing, cyanosis, or edema. Respiratory: Normal respiratory effort, no increased work of breathing. Skin: No rashes, bruises or suspicious lesions. Neurologic: Grossly intact, no focal deficits, moving all 4 extremities. Psychiatric: Normal mood and affect.   Assessment & Plan:    1. BPH with moderate LUTS He is interested in pursuing an outlet procedure and was particularly interested in UroLift.  The procedure was discussed in detail.  He has not had a TRUS prostate.  A renal ultrasound performed in 2019 measured the prostate dimensions and using the prostate volume calculator estimated volume is 131 cc.  His cystoscopy was felt only showed moderate lateral lobe enlargement at the mid and apical regions.  Recommend scheduling TRUS prostate for volume to determine suitability for  UroLift.  PVR by bladder scan was 49 mL   Abbie Sons, MD  [facility] [address] [place],  [address] [phone]

## 2020-01-04 ENCOUNTER — Encounter: Payer: Self-pay | Admitting: Urology

## 2020-01-05 ENCOUNTER — Other Ambulatory Visit: Payer: Self-pay | Admitting: *Deleted

## 2020-01-05 DIAGNOSIS — N401 Enlarged prostate with lower urinary tract symptoms: Secondary | ICD-10-CM

## 2020-01-05 NOTE — Addendum Note (Signed)
Addended by: Verlene Mayer A on: 01/05/2020 12:22 PM   Modules accepted: Orders

## 2020-01-06 ENCOUNTER — Other Ambulatory Visit: Payer: Medicare Other

## 2020-01-06 ENCOUNTER — Telehealth: Payer: Self-pay | Admitting: Urology

## 2020-01-06 NOTE — Telephone Encounter (Signed)
App made 

## 2020-01-06 NOTE — Telephone Encounter (Signed)
-----   Message from Abbie Sons, MD sent at 01/05/2020 11:15 PM EDT ----- Regarding: RE: Prostate ultrasound Yes ----- Message ----- From: Benard Halsted Sent: 01/05/2020   3:12 PM EDT To: Abbie Sons, MD Subject: RE: Prostate ultrasound                        Is that here in the office? ----- Message ----- From: Abbie Sons, MD Sent: 01/05/2020  12:33 PM EDT To: Benard Halsted Subject: Prostate ultrasound                            Please schedule this patient for a prostate ultrasound this week.  He is scheduled for UroLift next week.  Thanks

## 2020-01-07 ENCOUNTER — Other Ambulatory Visit
Admission: RE | Admit: 2020-01-07 | Discharge: 2020-01-07 | Disposition: A | Discharge status: Home / Self Care | Payer: Medicare Other | Source: Ambulatory Visit | Attending: Urology | Admitting: Urology

## 2020-01-07 ENCOUNTER — Encounter: Payer: Self-pay | Admitting: Urology

## 2020-01-07 ENCOUNTER — Ambulatory Visit (INDEPENDENT_AMBULATORY_CARE_PROVIDER_SITE_OTHER): Payer: Medicare Other | Admitting: Urology

## 2020-01-07 ENCOUNTER — Other Ambulatory Visit: Payer: Self-pay

## 2020-01-07 VITALS — BP 140/80 | HR 74 | Ht 74.0 in | Wt 226.0 lb

## 2020-01-07 DIAGNOSIS — N401 Enlarged prostate with lower urinary tract symptoms: Secondary | ICD-10-CM | POA: Diagnosis not present

## 2020-01-07 DIAGNOSIS — N138 Other obstructive and reflux uropathy: Secondary | ICD-10-CM

## 2020-01-07 HISTORY — DX: Type 2 diabetes mellitus without complications: E11.9

## 2020-01-07 HISTORY — DX: Myoneural disorder, unspecified: G70.9

## 2020-01-07 NOTE — Progress Notes (Signed)
Transrectal ultrasound prostate  Indications: BPH scheduled for UroLift.  Prior PVP with cystoscopy showing adenoma regrowth mid to apical region and open bladder neck.  Volume calculated at 130 cc via transabdominal ultrasound however this measurement seemed out of proportion based on cystoscopic findings.  Description: An 8 MHz transrectal ultrasound probe was lubricated and passed per rectum.  The prostate was imaged and 2 planes.  There was a TUR defect proximally with an open bladder neck.  Prostate volume taking into account the TUR defect is 71 cc.  No echogenic abnormalities of the peripheral zone noted.  Impression: BPH with proximal TUR defect  Plan: Proceed with UroLift as scheduled   John Giovanni, MD

## 2020-01-07 NOTE — Patient Instructions (Addendum)
INSTRUCTIONS FOR SURGERY     Your surgery is scheduled for:  Tuesday, June 29TH     To find out your arrival time for the day of surgery,          please call [phone] between 1 pm and 3 pm on :  Monday, June 28TH     When you arrive for surgery, report to the [facility].       Do NOT stop on the first floor to register.    REMEMBER: Instructions that are not followed completely may result in serious medical risk,  up to and including death, or upon the discretion of your surgeon and anesthesiologist,            your surgery may need to be rescheduled.  __X__ 1. Do not eat food after midnight the night before your procedure.                    No gum, candy, lozenger, tic tacs, tums or hard candies.                  ABSOLUTELY NOTHING SOLID IN YOUR MOUTH AFTER MIDNIGHT                    You may drink unlimited clear liquids up to 2 hours before you are scheduled to arrive for surgery.                   Do not drink anything within those 2 hours unless you need to take medicine, then take the                   smallest amount you need.  Clear liquids include:  water, apple juice without pulp,                   any flavor Gatorade, Black coffee, black tea.  Sugar may be added but no dairy/ honey /lemon.                        Broth and jello is not considered a clear liquid.  __x__  2. On the morning of surgery, please brush your teeth with toothpaste and water. You may rinse with                  mouthwash if you wish but DO NOT SWALLOW TOOTHPASTE OR MOUTHWASH  __X___3. NO alcohol for 24 hours before or after surgery.  __x___ 4.  Do NOT smoke or use e-cigarettes for 24 HOURS PRIOR TO SURGERY.                      DO NOT Use any chewable tobacco products for at least 6 hours prior to surgery.  __x___ 5. If you start any new medication after this appointment and prior to surgery, please                    Bring it with you on the day of surgery.  ___x__ 6. Notify your doctor if there is any change in your  medical condition, such as fever,                   infection, vomitting, diarrhea or any open sores.  __x___ 7.  USE ANTIBACTERIAL SOAP as instructed, the night before surgery and the day of surgery.                   Once you have washed with this soap, do NOT use any of the following: Powders, perfumes                    or lotions. Please do not wear make up, hairpins, clips or nail polish. You MAY wear deodorant.                   Men may shave their face and neck.  Women need to shave 48 hours prior to surgery.                   DO NOT wear ANY jewelry on the day of surgery. If there are rings that are too tight to                    remove easily, please address this prior to the surgery day. Piercings need to be removed.                                                                     NO METAL ON YOUR BODY.                    Do NOT bring any valuables.  If you came to Pre-Admit testing then you will not need license,                     insurance card or credit card.  If you will be staying overnight, please either leave your things in                     the car or have your family be responsible for these items.                     [facility] IS NOT RESPONSIBLE FOR BELONGINGS OR VALUABLES.  ___X__ 8. DO NOT wear contact lenses on surgery day.  You may not have dentures,                     Hearing aides, contacts or glasses in the operating room. These items can be                    Placed in the Recovery Room to receive immediately after surgery.  __x___ 9. IF YOU ARE SCHEDULED TO GO HOME ON THE SAME DAY, YOU MUST                   Have someone to drive you home and to stay with you  for the first 24 hours.                    Have an arrangement prior to arriving on surgery day.  ___x__ 10. Take the following medications on the morning  of surgery with a sip of water:                               1. ZYRTEC                     2. DUTASTERIDE                     3. OMEPRAZOLE                     4. PRAVACHOL                     5. TIMOLOL EYE DROPS                     6.  _____ 11.  Follow any instructions provided to you by your surgeon.                        Such as enema, clear liquid bowel prep  __X__  12. STOP ALL ASPIRIN PRODUCTS AS OF TODAY 01/07/20.                       THIS INCLUDES BC POWDERS / GOODIES POWDER  __x___ 13. STOP Anti-inflammatories as of: TODAY, 01/07/20                      This includes IBUPROFEN / MOTRIN / ADVIL / ALEVE/ NAPROXYN                    YOU MAY TAKE TYLENOL ANY TIME PRIOR TO SURGERY.  __X___ 14. You may continue taking Vitamin B12 but do not take on the morning of surgery.  __X____17.  Continue to take the following medications but do not take on the morning of surgery:                           MAGNESIUM / ASPIRIN / VITAMIN B12  ___X___18. Wear clean and comfortable clothing to the hospital.  CONTINUE TAKING YOUR EVENING MEDICINES AS USUAL. HAVE STOOL SOFTENERS AT HOME TO USE AFTER SURGERY IF YOU ARE TAKING    PAIN MEDICINE. IF YOU REMEMBER, BRING A COPY OF YOUR MEDICAL ADVANCE DIRECTIVES SO WE CAN   MAKE A COPY TO PUT IN YOUR RECORD.  Please remember not to eat something with protein on the day of surgery, if your sugar runs low.   Drink a clear liquid with sugar such as soda or gatorade.

## 2020-01-09 ENCOUNTER — Other Ambulatory Visit
Admission: RE | Admit: 2020-01-09 | Discharge: 2020-01-09 | Disposition: A | Discharge status: Home / Self Care | Payer: Medicare Other | Source: Ambulatory Visit | Attending: Urology | Admitting: Urology

## 2020-01-09 ENCOUNTER — Other Ambulatory Visit: Payer: Self-pay

## 2020-01-09 DIAGNOSIS — Z79899 Other long term (current) drug therapy: Secondary | ICD-10-CM | POA: Diagnosis not present

## 2020-01-09 DIAGNOSIS — N529 Male erectile dysfunction, unspecified: Secondary | ICD-10-CM | POA: Diagnosis not present

## 2020-01-09 DIAGNOSIS — Z20822 Contact with and (suspected) exposure to covid-19: Secondary | ICD-10-CM | POA: Diagnosis not present

## 2020-01-09 DIAGNOSIS — N401 Enlarged prostate with lower urinary tract symptoms: Secondary | ICD-10-CM | POA: Diagnosis present

## 2020-01-09 DIAGNOSIS — R972 Elevated prostate specific antigen [PSA]: Secondary | ICD-10-CM | POA: Diagnosis not present

## 2020-01-09 DIAGNOSIS — I2510 Atherosclerotic heart disease of native coronary artery without angina pectoris: Secondary | ICD-10-CM | POA: Diagnosis not present

## 2020-01-09 DIAGNOSIS — I5032 Chronic diastolic (congestive) heart failure: Secondary | ICD-10-CM | POA: Diagnosis not present

## 2020-01-09 DIAGNOSIS — K219 Gastro-esophageal reflux disease without esophagitis: Secondary | ICD-10-CM | POA: Diagnosis not present

## 2020-01-09 DIAGNOSIS — Z951 Presence of aortocoronary bypass graft: Secondary | ICD-10-CM | POA: Diagnosis not present

## 2020-01-09 DIAGNOSIS — E785 Hyperlipidemia, unspecified: Secondary | ICD-10-CM | POA: Diagnosis not present

## 2020-01-09 DIAGNOSIS — H409 Unspecified glaucoma: Secondary | ICD-10-CM | POA: Diagnosis not present

## 2020-01-09 DIAGNOSIS — Z01812 Encounter for preprocedural laboratory examination: Secondary | ICD-10-CM | POA: Insufficient documentation

## 2020-01-09 DIAGNOSIS — N138 Other obstructive and reflux uropathy: Secondary | ICD-10-CM | POA: Diagnosis not present

## 2020-01-09 DIAGNOSIS — Z7982 Long term (current) use of aspirin: Secondary | ICD-10-CM | POA: Diagnosis not present

## 2020-01-09 DIAGNOSIS — I110 Hypertensive heart disease with heart failure: Secondary | ICD-10-CM | POA: Diagnosis not present

## 2020-01-09 DIAGNOSIS — E119 Type 2 diabetes mellitus without complications: Secondary | ICD-10-CM | POA: Diagnosis not present

## 2020-01-09 LAB — BASIC METABOLIC PANEL
Anion gap: 8 (ref 5–15)
BUN: 26 mg/dL — ABNORMAL HIGH (ref 8–23)
CO2: 27 mmol/L (ref 22–32)
Calcium: 9.9 mg/dL (ref 8.9–10.3)
Chloride: 104 mmol/L (ref 98–111)
Creatinine, Ser: 1.42 mg/dL — ABNORMAL HIGH (ref 0.61–1.24)
GFR calc Af Amer: 54 mL/min — ABNORMAL LOW (ref >60)
GFR calc non Af Amer: 46 mL/min — ABNORMAL LOW (ref >60)
Glucose, Bld: 100 mg/dL — ABNORMAL HIGH (ref 70–99)
Potassium: 4.9 mmol/L (ref 3.5–5.1)
Sodium: 139 mmol/L (ref 135–145)

## 2020-01-09 NOTE — Progress Notes (Signed)
[facility] Perioperative Services  Pre-Admission/Anesthesia Testing Clinical Review  Date: 01/09/20  Patient Demographics:  Name: Bradley [name] DOB:   01-24-1941 MRN:   [id number]  Planned Surgical Procedure(s):    Case: [id number] Date/Time: 01/13/20 1100   Procedure: CYSTOSCOPY WITH INSERTION OF UROLIFT (N/A )   Anesthesia type: Monitor Anesthesia Care   Pre-op diagnosis: benign prostatic hypertrophy with urinary obstruction   Location: [facility] OR ROOM 10 / [place] [facility]   Surgeons: Abbie Sons, MD     NOTE: Available PAT nursing documentation and vital signs have been reviewed. Clinical nursing staff has updated patient's PMH/PSHx, current medication list, and drug allergies/intolerances to ensure comprehensive history available to assist in medical decision making as it pertains to the aforementioned surgical procedure and anticipated anesthetic course.   Clinical Discussion:  Bradley [name] is a 79 y.o. male who is submitted for pre-surgical anesthesia review and clearance prior to him undergoing the above procedure. Patient has never been a [name]. Pertinent PMH includes: CAD (s/p CABG x 4), HLD, HTN, CHF, RBBB, bradycardia, paroxysmal A.fib, moderate mitral valve insufficiency, BILATERAL carotid artery stenosis, Barrett's esophagus, GERD, DM (runs low; seeing endocrinology in 01/2020), PUD, GI bleed, meningioma, BPH, prostate cancer, cervical spine disc changes (see films under results).   Patient followed by cardiology Nehemiah Massed, MD). He was last seen in clinic on 12/16/2019; notes reviewed. Patient doing well overall. No significant cardiovascular symptoms, apparent progression of CVD and/or re-stenosis s/p CABG x 4 in 2016. He did note intermittent symptoms associated with his known A.fib, however despite MD education and encouragement, patient did not wish to placed on any type of rate/rhythm controlling medications. Cardiac clearance from Dr.  Nehemiah Massed received on 01/01/2020 indicating a LOW risk stratification.   He denies previous intra-operative complications with anesthesia. This patient is on daily low dose ASA therapy. He has been instructed on recommendations for holding his ASA for 7 days prior to his procedure. The patient has been instructed that his last dose of his anticoagulant will be on 01/07/2020.   Vitals with BMI 01/07/2020 01/07/2020 01/01/2020  Height 6\' 2"  6\' 2"  6\' 2"   Weight 226 lbs 226 lbs 229 lbs  BMI 29 29 98.33  Systolic 825 - 053  Diastolic 80 - 81  Pulse 74 - 59    Providers/Specialists:   PROVIDER ROLE LAST Claud Kelp, MD Urology (Surgeon) 01/01/2020  Sofie Hartigan, MD Primary Care Provider 12/08/2019  Serafina Royals, MD Cardiology 12/16/2019   Allergies:  Metoprolol, Sulfadiazine, Oxycodone, and Sulfa antibiotics  Current Home Medications:   No current facility-administered medications for this encounter.   Marland Kitchen acetaminophen (TYLENOL) 500 MG tablet  . aspirin EC 81 MG tablet  . cetirizine (ZYRTEC) 10 MG tablet  . chlorhexidine (PERIDEX) 0.12 % solution  . cyanocobalamin 2000 MCG tablet  . diphenhydrAMINE (BENADRYL) 25 mg capsule  . dutasteride (AVODART) 0.5 MG capsule  . hydrocortisone (ANUSOL-HC) 2.5 % rectal cream  . hydrocortisone cream 1 %  . latanoprost (XALATAN) 0.005 % ophthalmic solution  . Magnesium (V-R MAGNESIUM) 250 MG TABS  . Omeprazole 20 MG TBDD  . pravastatin (PRAVACHOL) 20 MG tablet  . Skin Protectants, Misc. (EUCERIN) cream  . timolol (BETIMOL) 0.25 % ophthalmic solution  . triamcinolone cream (KENALOG) 0.1 %   History:   Past Medical History:  Diagnosis Date  . Allergy   . Barrett's esophagus   . Blockage of coronary artery of heart ([place])   .  BPH (benign prostatic hyperplasia)   . Cataracts, bilateral   . Chronic diastolic CHF (congestive heart failure), NYHA class 2 ([place]) 12/16/2014  . Colon polyp   . Coronary artery disease   . DDD  (degenerative disc disease), lumbosacral 12/05/2017  . Diabetes mellitus without complication ([place])    usually running low. to see endocrinologist 01/2020   . Essential hypertension 06/05/2014  . GERD (gastroesophageal reflux disease)    Barretts esophagus  . GI bleed    a. ~2012 around time of hiatal hernia surgery. 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  . Glaucoma   . Glaucoma   . Hemorrhoids   . History of chicken pox   . History of hiatal hernia 2012  . Hyperlipidemia   . Meningioma ([place])    stable, followed by yearly MRIs  . Migraines   . Neuromuscular disorder (HCC)    neuropathies of lower extrems  . RBBB    also, AF and bradycardia  . Stomach ulcer    Past Surgical History:  Procedure Laterality Date  . ATHERECTOMY  1992  . CARDIAC CATHETERIZATION N/A 01/07/2015   Procedure: Left Heart Cath;  Surgeon: Corey Skains, MD;  Location: [place] [facility];  Service: Cardiovascular;  Laterality: N/A;  . CARDIAC SURGERY     coronary angioplasty  . CHOLECYSTECTOMY    . COLONOSCOPY  11/03/08, 03/09/14  . CORONARY ANGIOPLASTY    . CORONARY ARTERY BYPASS GRAFT N/A 01/19/2015   Procedure: CORONARY ARTERY BYPASS GRAFTING (CABG), with LIM to LAD, vein to left intermediate, vein to PD, vein to OM, using right greater saphenous vein via endovein harvest.;  Surgeon: Grace Isaac, MD;  Location: [place];  Service: Open Heart Surgery;  Laterality: N/A;  . DIAGNOSTIC LAPAROSCOPY     cholecystectomy and hiatal hernia repair  . ENDOVEIN HARVEST OF GREATER SAPHENOUS VEIN Right 01/19/2015   Procedure: ENDOVEIN HARVEST OF GREATER SAPHENOUS VEIN;  Surgeon: Grace Isaac, MD;  Location: [place];  Service: Open Heart Surgery;  Laterality: Right;  . ESOPHAGOGASTRODUODENOSCOPY  02/19/13  . ESOPHAGOGASTRODUODENOSCOPY (EGD) WITH PROPOFOL N/A 08/09/2015   Procedure: ESOPHAGOGASTRODUODENOSCOPY (EGD) WITH PROPOFOL;  Surgeon: Hulen Luster, MD;  Location: [facility] ENDOSCOPY;  Service:  Gastroenterology;  Laterality: N/A;  . ESOPHAGOGASTRODUODENOSCOPY (EGD) WITH PROPOFOL N/A 04/13/2017   Procedure: ESOPHAGOGASTRODUODENOSCOPY (EGD) WITH PROPOFOL;  Surgeon: [place], Benay Pike, MD;  Location: [facility] ENDOSCOPY;  Service: Endoscopy;  Laterality: N/A;  . ESOPHAGUS SURGERY    . EYE SURGERY     cataracts BIL  . HERNIA REPAIR    . HIATAL HERNIA REPAIR N/A   . POLYPECTOMY    . TEE WITHOUT CARDIOVERSION N/A 01/19/2015   Procedure: TRANSESOPHAGEAL ECHOCARDIOGRAM (TEE);  Surgeon: Grace Isaac, MD;  Location: [place];  Service: Open Heart Surgery;  Laterality: N/A;   Family History  Problem Relation Age of Onset  . Parkinsonism Mother   . Kidney disease Father   . Cancer Father        prostate  . Heart disease Father   . Cancer Paternal Uncle        esophageal  . Cancer Paternal Uncle    Social History   Tobacco Use  . Smoking status: Never [name]  . Smokeless tobacco: Never Used  Vaping Use  . Vaping Use: Never used  Substance Use Topics  . Alcohol use: No    Alcohol/week: 0.0 standard drinks  . Drug use: No    Pertinent Clinical Results:   Orders  Placed This Encounter  Procedures  . Basic metabolic panel  . EKG 12-Lead   LABS: Labs reviewed: Acceptable for surgery. PLEASE NOTE: all labs that were ordered this encounter are listed, however only abnormal results are displayed. Labs Reviewed  BASIC METABOLIC PANEL - Abnormal; Notable for the following components:      Result Value   Glucose, Bld 100 (*)    BUN 26 (*)    Creatinine, Ser 1.42 (*)    GFR calc non Af Amer 46 (*)    GFR calc Af Amer 54 (*)    All other components within normal limits   EKG: Date: 01/09/2020 Time ECG obtained: 1131 AM Rate: 49 bpm Rhythm:  SB with RBBB Axis (leads I and aVF): normal Intervals: PR 196 ms. QTc 390 ms. ST segment and T wave changes: No evidence of ST segment elevation or depression Comparison: Similar to previous tracing obtained on 01/14/2019.  IMAGING /  PROCEDURES: MYOVIEW done on 02/14/2019  LVEF 56%  Normal treadmill EKG without evidence of ischemia or arrhythmia.  Normal myocardial perfusion without evidence of myocardial ischemia    XRAY CERVICAL SPINE done on 03/07/2018 AP and lateral views revealed multilevel cervical degenerative disc changes with C4-C5, C5-6, and C6-7 disc space collapse with significant osteophyte formation.  There is decreased lordotic curve.   ECHOCARDIOGRAM done on 01/26/2015  Left ventricle: Possible inferobasal hypokinesis. The cavity size  was normal. Wall thickness was increased in a pattern of mild LVH.   The estimated ejection fraction was 55%.  Mitral valve: There was mild regurgitation.  Left atrium: The atrium was mildly dilated.   Right ventricle: The cavity size was mildly dilated.  Right atrium: The atrium was mildly dilated.   Atrial septum: No defect or patent foramen ovale was identified.   Impression and Plan:  Bradley [name] has been referred for pre-anesthesia review and clearance prior him undergoing the planned anesthetic and procedural courses. Available labs, pertinent testing, and imaging results were personally reviewed by me. This patient has been appropriately cleared by cardiology.   Based on clinical review performed today (01/09/20), barring and significant acute changes in the patient's overall condition, it is anticipated that he will be able to proceed with the planned surgical intervention. Any acute changes in clinical condition may necessitate his procedure being postponed and/or cancelled. Pre-surgical instructions were reviewed with the patient during his PAT appointment and questions were fielded by PAT clinical staff.  Honor Loh, MSN, APRN, FNP-C, CEN [facility]  Peri-operative Services Nurse Practitioner Phone: [phone] 01/09/20 2:02 PM  NOTE: This note has been prepared using Dragon dictation software. Despite my best ability to  proofread, there is always the potential that unintentional transcriptional errors may still occur from this process.

## 2020-01-09 NOTE — Anesthesia Preprocedure Evaluation (Addendum)
Anesthesia Evaluation  Patient identified by MRN, date of birth, ID band Patient awake    Reviewed: Allergy & Precautions, NPO status , Patient's Chart, lab work & pertinent test results  History of Anesthesia Complications Negative for: history of anesthetic complications  Airway Mallampati: III  TM Distance: >3 FB Neck ROM: Full    Dental no notable dental hx. (+) Teeth Intact, Dental Advisory Given   Pulmonary neg pulmonary ROS, neg sleep apnea, neg COPD, Patient abstained from smoking.Not current smoker,    Pulmonary exam normal breath sounds clear to auscultation       Cardiovascular Exercise Tolerance: Poor METShypertension, + CAD, + CABG and +CHF  (-) Past MI + dysrhythmias Atrial Fibrillation  Rhythm:Regular Rate:Normal - Systolic murmurs TTE 8338: INTERPRETATION  NORMAL LEFT VENTRICULAR SYSTOLIC FUNCTION  WITH MILD LVH  NORMAL RIGHT VENTRICULAR SYSTOLIC FUNCTION  MODERATE VALVULAR REGURGITATION (See above)  NO VALVULAR STENOSIS  MILD to MODERATE AR, MR  MILD TR, PR  EF >55%   Stress test 2020 with no ischemia  Cardiac clearance from Dr. Nehemiah Massed received on 01/01/2020 indicating a LOW risk stratification.    Neuro/Psych  Headaches, negative psych ROS   GI/Hepatic PUD, GERD  Medicated and Controlled,(+)     (-) substance abuse  ,   Endo/Other  diabetes  Renal/GU Renal InsufficiencyRenal disease     Musculoskeletal   Abdominal   Peds  Hematology   Anesthesia Other Findings Past Medical History: No date: Allergy No date: Barrett's esophagus No date: Blockage of coronary artery of heart (HCC) No date: BPH (benign prostatic hyperplasia) No date: Cataracts, bilateral 08/21/537: Chronic diastolic CHF (congestive heart failure), NYHA  class 2 (HCC) No date: Colon polyp No date: Coronary artery disease 12/05/2017: DDD (degenerative disc disease), lumbosacral No date: Diabetes mellitus without  complication (HCC)     Comment:  usually running low. to see endocrinologist 01/2020  06/05/2014: Essential hypertension No date: GERD (gastroesophageal reflux disease)     Comment:  Barretts esophagus No date: GI bleed     Comment:  a. ~2012 around time of hiatal hernia surgery. Developed              melena/BRBPR, was placed back on Dexilant long-term. No date: Glaucoma No date: Glaucoma No date: Hemorrhoids No date: History of chicken pox 2012: History of hiatal hernia No date: Hyperlipidemia No date: Meningioma (HCC)     Comment:  stable, followed by yearly MRIs No date: Migraines No date: Neuromuscular disorder (HCC)     Comment:  neuropathies of lower extrems No date: RBBB     Comment:  also, AF and bradycardia No date: Stomach ulcer  Reproductive/Obstetrics                           Anesthesia Physical Anesthesia Plan  ASA: III  Anesthesia Plan: General   Post-op Pain Management:    Induction: Intravenous  PONV Risk Score and Plan: 3 and Ondansetron and Dexamethasone  Airway Management Planned: LMA  Additional Equipment: None  Intra-op Plan:   Post-operative Plan: Extubation in OR  Informed Consent: I have reviewed the patients History and Physical, chart, labs and discussed the procedure including the risks, benefits and alternatives for the proposed anesthesia with the patient or authorized representative who has indicated his/her understanding and acceptance.     Dental advisory given  Plan Discussed with: CRNA and Surgeon  Anesthesia Plan Comments: (Discussed risks of anesthesia with patient, including PONV, sore  throat, lip/dental damage. Rare risks discussed as well, such as cardiorespiratory and neurological sequelae. Patient understands.)       Anesthesia Quick Evaluation                                  Anesthesia Evaluation  Patient identified by MRN, date of birth, ID band Patient awake    Reviewed: Allergy &  Precautions, H&P , NPO status , Patient's Chart, lab work & pertinent test results  History of Anesthesia Complications Negative for: history of anesthetic complications  Airway Mallampati: III  TM Distance: <3 FB Neck ROM: limited    Dental  (+) Poor Dentition, Chipped   Pulmonary neg pulmonary ROS, neg shortness of breath,           Cardiovascular Exercise Tolerance: Good hypertension, (-) angina+ CAD and + CABG  + dysrhythmias      Neuro/Psych  Headaches, TIAnegative psych ROS   GI/Hepatic Neg liver ROS, hiatal hernia, PUD, GERD  Medicated and Controlled,  Endo/Other  negative endocrine ROS  Renal/GU negative Renal ROS  negative genitourinary   Musculoskeletal   Abdominal   Peds  Hematology negative hematology ROS (+)   Anesthesia Other Findings Past Medical History: No date: Allergy No date: Barrett's esophagus No date: Blockage of coronary artery of heart (HCC) No date: BPH (benign prostatic hyperplasia) No date: Cataracts, bilateral No date: Colon polyp No date: Coronary artery disease 06/05/2014: Essential hypertension No date: GERD (gastroesophageal reflux disease)     Comment:  Barretts esophagus No date: GI bleed     Comment:  a. ~2012 around time of hiatal hernia surgery. Developed              melena/BRBPR, was placed back on Dexilant long-term. No date: Glaucoma No date: Hemorrhoids No date: History of chicken pox 2012: History of hiatal hernia No date: Hyperlipidemia No date: Meningioma [facility])     Comment:  stable, followed by yearly MRIs No date: Migraines No date: RBBB No date: Stomach ulcer  Past Surgical History: 1992: ATHERECTOMY 01/07/2015: CARDIAC CATHETERIZATION; N/A     Comment:  Procedure: Left Heart Cath;  Surgeon: Corey Skains,               MD;  Location: [place] [facility];  Service:               Cardiovascular;  Laterality: N/A; No date: CARDIAC SURGERY     Comment:  coronary angioplasty No date:  CHOLECYSTECTOMY 11/03/08, 03/09/14: COLONOSCOPY No date: CORONARY ANGIOPLASTY 01/19/2015: CORONARY ARTERY BYPASS GRAFT; N/A     Comment:  Procedure: CORONARY ARTERY BYPASS GRAFTING (CABG), with               LIM to LAD, vein to left intermediate, vein to PD, vein               to OM, using right greater saphenous vein via endovein               harvest.;  Surgeon: Grace Isaac, MD;  Location: [place];  Service: Open Heart Surgery;  Laterality: N/A; No date: DIAGNOSTIC LAPAROSCOPY     Comment:  cholecystectomy and hiatal hernia repair 01/19/2015: ENDOVEIN HARVEST OF GREATER SAPHENOUS VEIN; Right     Comment:  Procedure: ENDOVEIN HARVEST OF GREATER SAPHENOUS VEIN;  Surgeon: Grace Isaac, MD;  Location: [place];                Service: Open Heart Surgery;  Laterality: Right; 02/19/13: ESOPHAGOGASTRODUODENOSCOPY 08/09/2015: ESOPHAGOGASTRODUODENOSCOPY (EGD) WITH PROPOFOL; N/A     Comment:  Procedure: ESOPHAGOGASTRODUODENOSCOPY (EGD) WITH               PROPOFOL;  Surgeon: Hulen Luster, MD;  Location: [facility]               ENDOSCOPY;  Service: Gastroenterology;  Laterality: N/A; No date: ESOPHAGUS SURGERY No date: EYE SURGERY     Comment:  cataracts BIL No date: HERNIA REPAIR No date: HIATAL HERNIA REPAIR; N/A No date: POLYPECTOMY 01/19/2015: TEE WITHOUT CARDIOVERSION; N/A     Comment:  Procedure: TRANSESOPHAGEAL ECHOCARDIOGRAM (TEE);                Surgeon: Grace Isaac, MD;  Location: [place];                Service: Open Heart Surgery;  Laterality: N/A;     Reproductive/Obstetrics negative OB ROS                             Anesthesia Physical Anesthesia Plan  ASA: III  Anesthesia Plan: General   Post-op Pain Management:    Induction: Intravenous  PONV Risk Score and Plan: Propofol infusion  Airway Management Planned: Natural Airway and Nasal Cannula  Additional Equipment:   Intra-op Plan:   Post-operative Plan:    Informed Consent: I have reviewed the patients History and Physical, chart, labs and discussed the procedure including the risks, benefits and alternatives for the proposed anesthesia with the patient or authorized representative who has indicated his/her understanding and acceptance.   Dental Advisory Given  Plan Discussed with: Anesthesiologist, CRNA and Surgeon  Anesthesia Plan Comments: (Patient and family informed that patient is higher risk for complications from anesthesia during this procedure due to their medical history and age including but not limited to post operative cognitive dysfunction.  They voiced understanding.  Patient consented for risks of anesthesia including but not limited to:  - adverse reactions to medications - risk of intubation if required - damage to teeth, lips or other oral mucosa - sore throat or hoarseness - Damage to heart, brain, lungs or loss of life  Patient voiced understanding.)        Anesthesia Quick Evaluation                                   Anesthesia Evaluation  Patient identified by MRN, date of birth, ID band Patient awake    Reviewed: Allergy & Precautions, H&P , NPO status , Patient's Chart, lab work & pertinent test results  History of Anesthesia Complications Negative for: history of anesthetic complications  Airway Mallampati: III  TM Distance: <3 FB Neck ROM: limited    Dental  (+) Poor Dentition, Chipped   Pulmonary neg pulmonary ROS, neg shortness of breath,           Cardiovascular Exercise Tolerance: Good hypertension, (-) angina+ CAD and + CABG  + dysrhythmias      Neuro/Psych  Headaches, TIAnegative psych ROS   GI/Hepatic Neg liver ROS, hiatal hernia, PUD, GERD  Medicated and Controlled,  Endo/Other  negative endocrine ROS  Renal/GU negative Renal ROS  negative genitourinary  Musculoskeletal   Abdominal   Peds  Hematology negative hematology ROS (+)   Anesthesia Other  Findings Past Medical History: No date: Allergy No date: Barrett's esophagus No date: Blockage of coronary artery of heart (HCC) No date: BPH (benign prostatic hyperplasia) No date: Cataracts, bilateral No date: Colon polyp No date: Coronary artery disease 06/05/2014: Essential hypertension No date: GERD (gastroesophageal reflux disease)     Comment:  Barretts esophagus No date: GI bleed     Comment:  a. ~2012 around time of hiatal hernia surgery. Developed              melena/BRBPR, was placed back on Dexilant long-term. No date: Glaucoma No date: Hemorrhoids No date: History of chicken pox 2012: History of hiatal hernia No date: Hyperlipidemia No date: Meningioma [facility])     Comment:  stable, followed by yearly MRIs No date: Migraines No date: RBBB No date: Stomach ulcer  Past Surgical History: 1992: ATHERECTOMY 01/07/2015: CARDIAC CATHETERIZATION; N/A     Comment:  Procedure: Left Heart Cath;  Surgeon: Corey Skains,               MD;  Location: [place] [facility];  Service:               Cardiovascular;  Laterality: N/A; No date: CARDIAC SURGERY     Comment:  coronary angioplasty No date: CHOLECYSTECTOMY 11/03/08, 03/09/14: COLONOSCOPY No date: CORONARY ANGIOPLASTY 01/19/2015: CORONARY ARTERY BYPASS GRAFT; N/A     Comment:  Procedure: CORONARY ARTERY BYPASS GRAFTING (CABG), with               LIM to LAD, vein to left intermediate, vein to PD, vein               to OM, using right greater saphenous vein via endovein               harvest.;  Surgeon: Grace Isaac, MD;  Location: [place];  Service: Open Heart Surgery;  Laterality: N/A; No date: DIAGNOSTIC LAPAROSCOPY     Comment:  cholecystectomy and hiatal hernia repair 01/19/2015: ENDOVEIN HARVEST OF GREATER SAPHENOUS VEIN; Right     Comment:  Procedure: ENDOVEIN HARVEST OF GREATER SAPHENOUS VEIN;                Surgeon: Grace Isaac, MD;  Location: [place];                Service: Open Heart  Surgery;  Laterality: Right; 02/19/13: ESOPHAGOGASTRODUODENOSCOPY 08/09/2015: ESOPHAGOGASTRODUODENOSCOPY (EGD) WITH PROPOFOL; N/A     Comment:  Procedure: ESOPHAGOGASTRODUODENOSCOPY (EGD) WITH               PROPOFOL;  Surgeon: Hulen Luster, MD;  Location: [facility]               ENDOSCOPY;  Service: Gastroenterology;  Laterality: N/A; No date: ESOPHAGUS SURGERY No date: EYE SURGERY     Comment:  cataracts BIL No date: HERNIA REPAIR No date: HIATAL HERNIA REPAIR; N/A No date: POLYPECTOMY 01/19/2015: TEE WITHOUT CARDIOVERSION; N/A     Comment:  Procedure: TRANSESOPHAGEAL ECHOCARDIOGRAM (TEE);                Surgeon: Grace Isaac, MD;  Location: [place];                Service: Open Heart Surgery;  Laterality: N/A;  Reproductive/Obstetrics negative OB ROS                             Anesthesia Physical Anesthesia Plan  ASA: III  Anesthesia Plan: General   Post-op Pain Management:    Induction: Intravenous  PONV Risk Score and Plan: Propofol infusion  Airway Management Planned: Natural Airway and Nasal Cannula  Additional Equipment:   Intra-op Plan:   Post-operative Plan:   Informed Consent: I have reviewed the patients History and Physical, chart, labs and discussed the procedure including the risks, benefits and alternatives for the proposed anesthesia with the patient or authorized representative who has indicated his/her understanding and acceptance.   Dental Advisory Given  Plan Discussed with: Anesthesiologist, CRNA and Surgeon  Anesthesia Plan Comments: (Patient and family informed that patient is higher risk for complications from anesthesia during this procedure due to their medical history and age including but not limited to post operative cognitive dysfunction.  They voiced understanding.  Patient consented for risks of anesthesia including but not limited to:  - adverse reactions to medications - risk of intubation if required - damage  to teeth, lips or other oral mucosa - sore throat or hoarseness - Damage to heart, brain, lungs or loss of life  Patient voiced understanding.)        Anesthesia Quick Evaluation

## 2020-01-10 LAB — CULTURE, URINE COMPREHENSIVE

## 2020-01-10 LAB — SARS CORONAVIRUS 2 (TAT 6-24 HRS): SARS Coronavirus 2: NEGATIVE

## 2020-01-11 ENCOUNTER — Emergency Department
Admission: EM | Admit: 2020-01-11 | Discharge: 2020-01-11 | Disposition: A | Discharge status: Home / Self Care | Payer: Medicare Other | Attending: Emergency Medicine | Admitting: Emergency Medicine

## 2020-01-11 ENCOUNTER — Other Ambulatory Visit: Payer: Self-pay

## 2020-01-11 DIAGNOSIS — Z7982 Long term (current) use of aspirin: Secondary | ICD-10-CM | POA: Diagnosis not present

## 2020-01-11 DIAGNOSIS — H538 Other visual disturbances: Secondary | ICD-10-CM | POA: Insufficient documentation

## 2020-01-11 DIAGNOSIS — I10 Essential (primary) hypertension: Secondary | ICD-10-CM | POA: Diagnosis not present

## 2020-01-11 DIAGNOSIS — E119 Type 2 diabetes mellitus without complications: Secondary | ICD-10-CM | POA: Diagnosis not present

## 2020-01-11 DIAGNOSIS — R338 Other retention of urine: Secondary | ICD-10-CM | POA: Diagnosis present

## 2020-01-11 LAB — URINALYSIS, COMPLETE (UACMP) WITH MICROSCOPIC
Bacteria, UA: NONE SEEN
Bilirubin Urine: NEGATIVE
Glucose, UA: NEGATIVE mg/dL
Hgb urine dipstick: NEGATIVE
Ketones, ur: NEGATIVE mg/dL
Leukocytes,Ua: NEGATIVE
Nitrite: NEGATIVE
Protein, ur: NEGATIVE mg/dL
Specific Gravity, Urine: 1.018 (ref 1.005–1.030)
Squamous Epithelial / HPF: NONE SEEN (ref 0–5)
pH: 6 (ref 5.0–8.0)

## 2020-01-11 LAB — CBC
HCT: 45.9 % (ref 39.0–52.0)
Hemoglobin: 15.4 g/dL (ref 13.0–17.0)
MCH: 33 pg (ref 26.0–34.0)
MCHC: 33.6 g/dL (ref 30.0–36.0)
MCV: 98.5 fL (ref 80.0–100.0)
Platelets: 232 10*3/uL (ref 150–400)
RBC: 4.66 MIL/uL (ref 4.22–5.81)
RDW: 13.3 % (ref 11.5–15.5)
WBC: 8.8 10*3/uL (ref 4.0–10.5)
nRBC: 0 % (ref 0.0–0.2)

## 2020-01-11 LAB — BASIC METABOLIC PANEL
Anion gap: 7 (ref 5–15)
BUN: 24 mg/dL — ABNORMAL HIGH (ref 8–23)
CO2: 26 mmol/L (ref 22–32)
Calcium: 9.8 mg/dL (ref 8.9–10.3)
Chloride: 105 mmol/L (ref 98–111)
Creatinine, Ser: 1.54 mg/dL — ABNORMAL HIGH (ref 0.61–1.24)
GFR calc Af Amer: 49 mL/min — ABNORMAL LOW (ref >60)
GFR calc non Af Amer: 42 mL/min — ABNORMAL LOW (ref >60)
Glucose, Bld: 77 mg/dL (ref 70–99)
Potassium: 4.8 mmol/L (ref 3.5–5.1)
Sodium: 138 mmol/L (ref 135–145)

## 2020-01-11 NOTE — ED Notes (Signed)
Introduced myself to pt and pt stated he felt like he could use bathroom. Assisted pt to bathroom.

## 2020-01-11 NOTE — Discharge Instructions (Signed)
Please keep your scheduled follow-up appointments with primary care and urology.  If you have symptoms of concern tonight or prior to your follow-up appointments, please return to the emergency department.

## 2020-01-11 NOTE — ED Triage Notes (Addendum)
Pt comes with c/o urinary retention that started last night. Pt states he hasn't been able to empty bladder. Pt also states some blurry vision earlier this am.   Pt denies any falls, SOB, CP, weakness or dizziness.  Pt states the vision is just more not as clear as usual. Pt also states some numbness in left hand and arm the other night. Pt states it has since resolved.

## 2020-01-11 NOTE — ED Provider Notes (Signed)
[facility] Emergency Department Provider Note ____________________________________________   First MD Initiated Contact with Patient 01/11/20 1716     (approximate)  I have reviewed the triage vital signs and the nursing notes.   HISTORY  Chief Complaint Urinary Retention and Blurred Vision  HPI Bradley [name] is a 79 y.o. male presenting to the emergency department for treatment and evaluation of decreased need to urinate.  Patient states he typically gets up 2 times to urinate in the night but did not have to get up at all last night.  Upon awakening this morning, he only urinated a very small amount and has not urinated again today. He has a prostate procedure scheduled for this coming Tuesday due to inability to urinate. He has been on oxybutynin for some time but it causes severe dry mouth and throat and he has stopped taking it.         Past Medical History:  Diagnosis Date   Allergy    Barrett's esophagus    Blockage of coronary artery of heart (HCC)    BPH (benign prostatic hyperplasia)    Cataracts, bilateral    Chronic diastolic CHF (congestive heart failure), NYHA class 2 (Bradley [name]) 12/16/2014   Colon polyp    Coronary artery disease    DDD (degenerative disc disease), lumbosacral 12/05/2017   Diabetes mellitus without complication (Bradley [name])    usually running low. to see endocrinologist 01/2020    Essential hypertension 06/05/2014   GERD (gastroesophageal reflux disease)    Barretts esophagus   GI bleed    a. ~2012 around time of hiatal hernia surgery. 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   Glaucoma    Glaucoma    Hemorrhoids    History of chicken pox    History of hiatal hernia 2012   Hyperlipidemia    Meningioma ([place])    stable, followed by yearly MRIs   Migraines    Neuromuscular disorder ([place])    neuropathies of lower extrems   RBBB    also, AF and bradycardia   Stomach ulcer      Patient Active Problem List   Diagnosis Date Noted   Bradycardia 02/13/2019   Meningioma ([place]) 05/30/2018   External hemorrhoids 04/25/2018   Incontinence of feces 04/25/2018   DDD (degenerative disc disease), cervical 03/07/2018   Glaucoma (increased eye pressure) 12/19/2017   DDD (degenerative disc disease), lumbosacral 12/05/2017   Urinary frequency 06/21/2017   Urinary urgency 06/21/2017   Bilateral carotid artery stenosis 04/11/2017   Atrial fibrillation ([place]) 01/30/2015   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   Stable angina (HCC) 01/07/2015   Chronic diastolic CHF (congestive heart failure), NYHA class 2 (Bradley [name]) 12/16/2014   Arteriosclerosis of coronary artery 12/10/2014   GERD (gastroesophageal reflux disease) 06/16/2014   Benign essential HTN 06/05/2014   Mixed hyperlipidemia 06/05/2014   Moderate mitral insufficiency 06/05/2014   Chest pain 01/12/2014   Angina pectoris ([place]) 10/09/2013   Benign neoplasm of large bowel 10/09/2013   Esophageal disease 10/09/2013   Benign prostatic hyperplasia with urinary obstruction 04/01/2012   CA in situ prostate 04/01/2012   ED (erectile dysfunction) of organic origin 04/01/2012   Elevated prostate specific antigen (PSA) 04/01/2012   Benign neoplasm of cerebral meninges ([place]) 04/24/2011    Past Surgical History:  Procedure Laterality Date   [place] N/A 01/07/2015   Procedure: Left Heart Cath;  Surgeon: Corey Skains, MD;  Location: [place];  Service: Cardiovascular;  Laterality: N/A;   CARDIAC SURGERY     coronary angioplasty   CHOLECYSTECTOMY     COLONOSCOPY  11/03/08, 03/09/14   CORONARY ANGIOPLASTY     CORONARY ARTERY BYPASS GRAFT N/A 01/19/2015   Procedure: CORONARY ARTERY BYPASS GRAFTING (CABG), with LIM to LAD, vein to left intermediate, vein to PD, vein to OM, using right greater saphenous vein via endovein harvest.;  Surgeon: Grace Isaac, MD;   Location: [facility];  Service: Open Heart Surgery;  Laterality: N/A;   DIAGNOSTIC LAPAROSCOPY     cholecystectomy and hiatal hernia repair   ENDOVEIN HARVEST OF GREATER SAPHENOUS VEIN Right 01/19/2015   Procedure: ENDOVEIN HARVEST OF GREATER SAPHENOUS VEIN;  Surgeon: Grace Isaac, MD;  Location: [place];  Service: Open Heart Surgery;  Laterality: Right;   ESOPHAGOGASTRODUODENOSCOPY  02/19/13   ESOPHAGOGASTRODUODENOSCOPY (EGD) WITH PROPOFOL N/A 08/09/2015   Procedure: ESOPHAGOGASTRODUODENOSCOPY (EGD) WITH PROPOFOL;  Surgeon: Hulen Luster, MD;  [facility] END[facility];  Service: Gastroenterology;  Laterality: N/A;   ESOPHAGOGASTRODUODENOSCOPY (EGD) WITH PROPOFOL N/A 04/13/2017   Procedure: ESOPHAGOGASTRODUODENOSCOPY (EGD) WITH PROPOFOL;  Surgeon: Tol[place], Benay Pike, MD;  Location: [facility] ENDOSCOPY;  Service: Endoscopy;  Laterality: N/A;   ESOPHAGUS SURGERY     EYE SURGERY     cataracts BIL   HERNIA REPAIR     HIATAL HERNIA REPAIR N/A    POLYPECTOMY     TEE WITHOUT CARDIOVERSION N/A 01/19/2015   Procedure: TRANSESOPHAGEAL ECHOCARDIOGRAM (TEE);  Surgeon: Grace Isaac, MD;  Location: [place] [facility];  Service: Open Heart Surgery;  Laterality: N/A;    Prior to Admission medications   Medication Sig Start Date End Date Taking? Authorizing Provider  acetaminophen (TYLENOL) 500 MG tablet Take 500 mg by mouth every 6 (six) hours as needed for mild pain or moderate pain.    [provider]  aspirin EC 81 MG tablet Take 81 mg by mouth daily.    [provider]  cetirizine (ZYRTEC) 10 MG tablet Take 10 mg by mouth daily.     [provider]  chlorhexidine (PERIDEX) 0.12 % solution Use as directed 15 mLs in the mouth or throat 2 (two) times daily as needed (for gum inflammation).     [provider]  cyanocobalamin 2000 MCG tablet Take 2,000 mcg by mouth daily.     [provider]  diphenhydrAMINE (BENADRYL) 25 mg capsule Take 25 mg by mouth every 6 (six) hours as  needed for itching.    [provider]  dutasteride (AVODART) 0.5 MG capsule Take 1 capsule (0.5 mg total) by mouth daily. 10/11/19   Stoioff, Ronda Fairly, MD  hydrocortisone (ANUSOL-HC) 2.5 % rectal cream Place 1 application rectally daily as needed for hemorrhoids.  02/10/19   [provider]  hydrocortisone cream 1 % Apply 1 application topically 3 (three) times daily as needed for itching.     [provider]  latanoprost (XALATAN) 0.005 % ophthalmic solution Place 1 drop into both eyes at bedtime.    [provider]  Magnesium (V-R MAGNESIUM) 250 MG TABS Take 250 mg by mouth daily.     [provider]  Omeprazole 20 MG TBDD Take 20 mg by mouth daily.  05/01/19   [provider]  pravastatin (PRAVACHOL) 20 MG tablet Take 20 mg by mouth daily.    [provider]  Skin Protectants, Misc. (EUCERIN) cream Apply 1 application topically daily as needed for dry skin.    [provider]  timolol (BETIMOL) 0.25 % ophthalmic solution Place 1 drop into both eyes daily.    [provider]  triamcinolone cream (KENALOG) 0.1 % Apply 1 application topically 2 (two) times daily as needed (Foot).    [provider]    Allergies Metoprolol, Sulfadiazine, Oxycodone, and Sulfa antibiotics  Family History  Problem Relation Age of Onset   Parkinsonism Mother    Kidney disease Father    Cancer Father        prostate   Heart disease Father    Cancer Paternal Uncle        esophageal   Cancer Paternal Uncle     Social History Social History   Tobacco Use   Smoking status: Never Smoker   Smokeless tobacco: Never Used  Scientific laboratory technician Use: Never used  Substance Use Topics   Alcohol use: No    Alcohol/week: 0.0 standard drinks   Drug use: No    Review of Systems  Constitutional: No fever/chills Eyes: No visual changes. ENT: No sore throat. Cardiovascular: Denies chest pain. Respiratory: Negative  for shortness of breath or cough. Gastrointestinal: No abdominal pain.  No nausea, no vomiting.  No diarrhea.  No constipation. Genitourinary: Negative for dysuria. Musculoskeletal: Negative for back pain. Skin: Negative for rash. Neurological: Negative for headaches, focal weakness or focal numbness. ____________________________________________   PHYSICAL EXAM:  VITAL SIGNS: ED Triage Vitals  Enc Vitals Group     BP 01/11/20 1448 (!) 165/91     Pulse Rate 01/11/20 1448 63     Resp 01/11/20 1448 19     Temp 01/11/20 1448 98 F (36.7 C)     Temp src --      SpO2 01/11/20 1448 100 %     Weight 01/11/20 1449 226 lb (102.5 kg)     Height 01/11/20 1449 6\' 2"  (1.88 m)     Head Circumference --      Peak Flow --      Pain Score 01/11/20 1448 3     Pain Loc --      Pain Edu? --      Excl. in [name]? --     Constitutional: Alert and oriented. Well appearing and in no acute distress. Eyes: Conjunctivae are normal. PERRL. EOMI. Head: Atraumatic. Nose: No congestion/rhinnorhea. Mouth/Throat: Mucous membranes are moist. Neck: No stridor.   Hematological/Lymphatic/Immunilogical: No cervical lymphadenopathy. Cardiovascular: Normal rate, regular rhythm. Grossly normal heart sounds.  Good peripheral circulation. Respiratory: Normal respiratory effort.  No retractions.  Gastrointestinal: Soft and nontender. No distention. No abdominal bruits. No CVA tenderness. Genitourinary:  Musculoskeletal: No lower extremity tenderness nor edema.  No joint effusions. Full active ROM throughout. Neurologic:  Normal speech and language. No gross focal neurologic deficits are appreciated. No gait instability. Motor and sensory function intact.  Skin:  Skin is warm, dry and intact. No rash noted. Psychiatric: Mood and affect are normal. Speech and behavior are normal.  ____________________________________________   LABS (all labs ordered are listed, but only abnormal results are displayed)  Labs  Reviewed  BASIC METABOLIC PANEL - Abnormal; Notable for the following components:      Result Value   BUN 24 (*)    Creatinine, Ser 1.54 (*)    GFR calc non Af Amer 42 (*)    GFR calc Af Amer 49 (*)    All other components within normal limits  URINALYSIS, COMPLETE (UACMP) WITH MICROSCOPIC - Abnormal; Notable for the following components:   Color, Urine YELLOW (*)  APPearance CLEAR (*)    All other components within normal limits  CBC   ____________________________________________  EKG  ED ECG REPORT  ____________________________________________  RADIOLOGY  ED MD interpretation:   Not indicated  I, Sherrie George, personally viewed and evaluated these images (plain radiographs) as part of my medical decision making, as well as reviewing the written report by the radiologist.  Official radiology report(s): No results found.  ____________________________________________   PROCEDURES  Procedure(s) performed (including Critical Care):  Procedures  ____________________________________________  79 year old male presenting to the emergency department for treatment and evaluation of multiple medical symptoms.  See HPI for further details.  Bladder scan in triage showed a volume of 226.  Patient voided 175 mL on his own.  Urinalysis is reassuring.  BUN and creatinine are somewhat elevated but appear to be near his baseline.  Patient states that the blurred vision typically occurs in the mornings when he puts his drops for glaucoma in but it typically lasts only 1/32 or so and then goes away.  He states that it has lasted a little longer today.  His vision is "just not as good as usual."  He has a longstanding history of a meningioma for which she receives MRI on a yearly basis.  Patient states that anytime he has any type of headache or vision changes that he "thinks about that" and worries that it may be getting bigger.  He is currently no longer concerned about the vision and  states that he thought that he would just mention it while he was here.  No vision loss or strokelike symptoms.  He also states that he has had some paresthesias in his left hand last week.  He has had this in the past and has an appointment with his primary care provider regarding this tomorrow.  He states that the paresthesias are now completely gone.  Plan will be to have him keep his scheduled follow-up appointment with primary care tomorrow as well as his procedure that is scheduled for Tuesday.  Patient states that he feels "fine."  He would like to be discharged home.  This seems reasonable given the symptoms have resolved and he has a normal neurological exam as well as no indication of acute cystitis or other emergent conditions. ____________________________________________   FINAL CLINICAL IMPRESSION(S) / ED DIAGNOSES  Final diagnoses:  None     ED Discharge Orders    None       Bradley [name] was evaluated in Emergency Department on 01/11/2020 for the symptoms described in the history of present illness. He was evaluated in the context of the global COVID-19 pandemic, which necessitated consideration that the patient might be at risk for infection with the SARS-CoV-2 virus that causes COVID-19. Institutional protocols and algorithms that pertain to the evaluation of patients at risk for COVID-19 are in a state of rapid change based on information released by regulatory bodies including the CDC and federal and state organizations. These policies and algorithms were followed during the patient's care in the ED.   Note:  This document was prepared using Dragon voice recognition software and may include unintentional dictation errors.   Victorino Dike, FNP 01/11/20 2103    Blake Divine, MD 01/12/20 [phone]

## 2020-01-11 NOTE — ED Notes (Signed)
Pt had 131ml output of urine after assisting to bathroom. Pt denied any difficulty w/ urination and stated "everything felt like it normally does."

## 2020-01-13 ENCOUNTER — Encounter
Admission: RE | Disposition: A | Discharge status: Home / Self Care | Payer: Self-pay | Source: Home / Self Care | Attending: Urology

## 2020-01-13 ENCOUNTER — Encounter: Payer: Self-pay | Admitting: Urology

## 2020-01-13 ENCOUNTER — Ambulatory Visit
Admission: RE | Admit: 2020-01-13 | Discharge: 2020-01-13 | Disposition: A | Discharge status: Home / Self Care | Payer: Medicare Other | Attending: Urology | Admitting: Urology

## 2020-01-13 ENCOUNTER — Ambulatory Visit: Payer: Medicare Other | Admitting: Urgent Care

## 2020-01-13 ENCOUNTER — Other Ambulatory Visit: Payer: Self-pay

## 2020-01-13 DIAGNOSIS — Z7982 Long term (current) use of aspirin: Secondary | ICD-10-CM | POA: Insufficient documentation

## 2020-01-13 DIAGNOSIS — Z951 Presence of aortocoronary bypass graft: Secondary | ICD-10-CM | POA: Insufficient documentation

## 2020-01-13 DIAGNOSIS — E785 Hyperlipidemia, unspecified: Secondary | ICD-10-CM | POA: Insufficient documentation

## 2020-01-13 DIAGNOSIS — N138 Other obstructive and reflux uropathy: Secondary | ICD-10-CM | POA: Diagnosis not present

## 2020-01-13 DIAGNOSIS — I2510 Atherosclerotic heart disease of native coronary artery without angina pectoris: Secondary | ICD-10-CM | POA: Insufficient documentation

## 2020-01-13 DIAGNOSIS — H409 Unspecified glaucoma: Secondary | ICD-10-CM | POA: Insufficient documentation

## 2020-01-13 DIAGNOSIS — I110 Hypertensive heart disease with heart failure: Secondary | ICD-10-CM | POA: Insufficient documentation

## 2020-01-13 DIAGNOSIS — N401 Enlarged prostate with lower urinary tract symptoms: Secondary | ICD-10-CM | POA: Insufficient documentation

## 2020-01-13 DIAGNOSIS — N529 Male erectile dysfunction, unspecified: Secondary | ICD-10-CM | POA: Insufficient documentation

## 2020-01-13 DIAGNOSIS — K219 Gastro-esophageal reflux disease without esophagitis: Secondary | ICD-10-CM | POA: Insufficient documentation

## 2020-01-13 DIAGNOSIS — Z79899 Other long term (current) drug therapy: Secondary | ICD-10-CM | POA: Insufficient documentation

## 2020-01-13 DIAGNOSIS — I5032 Chronic diastolic (congestive) heart failure: Secondary | ICD-10-CM | POA: Insufficient documentation

## 2020-01-13 DIAGNOSIS — R972 Elevated prostate specific antigen [PSA]: Secondary | ICD-10-CM | POA: Insufficient documentation

## 2020-01-13 DIAGNOSIS — E119 Type 2 diabetes mellitus without complications: Secondary | ICD-10-CM | POA: Insufficient documentation

## 2020-01-13 HISTORY — PX: CYSTOSCOPY WITH INSERTION OF UROLIFT: SHX6678

## 2020-01-13 LAB — GLUCOSE, CAPILLARY
Glucose-Capillary: 91 mg/dL (ref 70–99)
Glucose-Capillary: 95 mg/dL (ref 70–99)

## 2020-01-13 SURGERY — CYSTOSCOPY WITH INSERTION OF UROLIFT
Anesthesia: General

## 2020-01-13 MED ORDER — ONDANSETRON HCL 4 MG/2ML IJ SOLN: 4.0000 mg | Freq: Once | INTRAMUSCULAR | Status: DC | PRN

## 2020-01-13 MED ORDER — DEXAMETHASONE SODIUM PHOSPHATE 10 MG/ML IJ SOLN
INTRAMUSCULAR | Status: AC
  Filled 2020-01-13: qty 1

## 2020-01-13 MED ORDER — EPHEDRINE SULFATE 50 MG/ML IJ SOLN
INTRAMUSCULAR | Status: DC | PRN
  Administered 2020-01-13: 5 mg via INTRAVENOUS
  Administered 2020-01-13: 10 mg via INTRAVENOUS

## 2020-01-13 MED ORDER — LIDOCAINE HCL (CARDIAC) PF 100 MG/5ML IV SOSY
PREFILLED_SYRINGE | INTRAVENOUS | Status: DC | PRN
  Administered 2020-01-13: 100 mg via INTRAVENOUS

## 2020-01-13 MED ORDER — URIBEL 118 MG PO CAPS
1.0000 | ORAL_CAPSULE | Freq: Three times a day (TID) | ORAL | 0 refills | Status: DC | PRN
Start: 2020-01-13 — End: 2020-06-20

## 2020-01-13 MED ORDER — ACETAMINOPHEN 10 MG/ML IV SOLN: 1000.0000 mg | Freq: Once | INTRAVENOUS | Status: DC | PRN

## 2020-01-13 MED ORDER — DEXMEDETOMIDINE HCL IN NACL 200 MCG/50ML IV SOLN
INTRAVENOUS | Status: DC | PRN
  Administered 2020-01-13 (×2): 4 ug via INTRAVENOUS

## 2020-01-13 MED ORDER — FENTANYL CITRATE (PF) 100 MCG/2ML IJ SOLN: 25.0000 ug | INTRAMUSCULAR | Status: DC | PRN

## 2020-01-13 MED ORDER — ONDANSETRON HCL 4 MG/2ML IJ SOLN
INTRAMUSCULAR | Status: DC | PRN
  Administered 2020-01-13: 4 mg via INTRAVENOUS

## 2020-01-13 MED ORDER — CEFAZOLIN SODIUM-DEXTROSE 2-4 GM/100ML-% IV SOLN
2.0000 g | INTRAVENOUS | Status: AC
  Administered 2020-01-13: 2 g via INTRAVENOUS

## 2020-01-13 MED ORDER — ARTIFICIAL TEARS OPHTHALMIC OINT
TOPICAL_OINTMENT | OPHTHALMIC | Status: AC
  Filled 2020-01-13: qty 3.5

## 2020-01-13 MED ORDER — FAMOTIDINE 20 MG PO TABS
ORAL_TABLET | ORAL | Status: AC
  Filled 2020-01-13: qty 1

## 2020-01-13 MED ORDER — CEFAZOLIN SODIUM-DEXTROSE 2-4 GM/100ML-% IV SOLN
INTRAVENOUS | Status: AC
  Filled 2020-01-13: qty 100

## 2020-01-13 MED ORDER — ONDANSETRON HCL 4 MG/2ML IJ SOLN
INTRAMUSCULAR | Status: AC
  Filled 2020-01-13: qty 2

## 2020-01-13 MED ORDER — CHLORHEXIDINE GLUCONATE 0.12 % MT SOLN
OROMUCOSAL | Status: AC
  Administered 2020-01-13: 15 mL via OROMUCOSAL
  Filled 2020-01-13: qty 15

## 2020-01-13 MED ORDER — DEXAMETHASONE SODIUM PHOSPHATE 10 MG/ML IJ SOLN
INTRAMUSCULAR | Status: DC | PRN
  Administered 2020-01-13: 10 mg via INTRAVENOUS

## 2020-01-13 MED ORDER — PHENYLEPHRINE HCL (PRESSORS) 10 MG/ML IV SOLN
INTRAVENOUS | Status: DC | PRN
  Administered 2020-01-13: 80 ug via INTRAVENOUS

## 2020-01-13 MED ORDER — CHLORHEXIDINE GLUCONATE 0.12 % MT SOLN: 15.0000 mL | Freq: Once | OROMUCOSAL | Status: AC

## 2020-01-13 MED ORDER — PROPOFOL 10 MG/ML IV BOLUS
INTRAVENOUS | Status: AC
  Filled 2020-01-13: qty 20

## 2020-01-13 MED ORDER — LIDOCAINE HCL (PF) 2 % IJ SOLN
INTRAMUSCULAR | Status: AC
  Filled 2020-01-13: qty 5

## 2020-01-13 MED ORDER — ORAL CARE MOUTH RINSE: 15.0000 mL | Freq: Once | OROMUCOSAL | Status: AC

## 2020-01-13 MED ORDER — LABETALOL HCL 5 MG/ML IV SOLN
INTRAVENOUS | Status: DC | PRN
Start: 2020-01-13 — End: 2020-01-13
  Administered 2020-01-13: 10 mg via INTRAVENOUS

## 2020-01-13 MED ORDER — LACTATED RINGERS IV SOLN: INTRAVENOUS | Status: DC

## 2020-01-13 MED ORDER — SODIUM CHLORIDE 0.9 % IV SOLN: INTRAVENOUS | Status: DC

## 2020-01-13 MED ORDER — GLYCOPYRROLATE 0.2 MG/ML IJ SOLN
INTRAMUSCULAR | Status: DC | PRN
  Administered 2020-01-13: .2 mg via INTRAVENOUS

## 2020-01-13 MED ORDER — PROPOFOL 10 MG/ML IV BOLUS
INTRAVENOUS | Status: DC | PRN
  Administered 2020-01-13: 50 mg via INTRAVENOUS
  Administered 2020-01-13: 120 mg via INTRAVENOUS

## 2020-01-13 SURGICAL SUPPLY — 16 items
BAG DRAIN CYSTO-URO LG1000N (MISCELLANEOUS) ×3 IMPLANT
BAG URINE DRAIN 2000ML AR STRL (UROLOGICAL SUPPLIES) ×2 IMPLANT
CATH FOL 2WAY LX 18X30 (CATHETERS) ×2 IMPLANT
GLOVE BIOGEL PI IND STRL 7.5 (GLOVE) ×1 IMPLANT
GLOVE BIOGEL PI INDICATOR 7.5 (GLOVE) ×2
GOWN STRL REUS W/ TWL LRG LVL3 (GOWN DISPOSABLE) ×1 IMPLANT
GOWN STRL REUS W/ TWL XL LVL3 (GOWN DISPOSABLE) ×1 IMPLANT
GOWN STRL REUS W/TWL LRG LVL3 (GOWN DISPOSABLE) ×2
GOWN STRL REUS W/TWL XL LVL3 (GOWN DISPOSABLE) ×2
KIT TURNOVER CYSTO (KITS) ×3 IMPLANT
PACK CYSTO AR (MISCELLANEOUS) ×3 IMPLANT
SET CYSTO W/LG BORE CLAMP LF (SET/KITS/TRAYS/PACK) ×3 IMPLANT
SURGILUBE 2OZ TUBE FLIPTOP (MISCELLANEOUS) IMPLANT
SYSTEM UROLIFT (Male Continence) ×16 IMPLANT
WATER STERILE IRR 1000ML POUR (IV SOLUTION) ×3 IMPLANT
WATER STERILE IRR 3000ML UROMA (IV SOLUTION) ×5 IMPLANT

## 2020-01-13 NOTE — Op Note (Addendum)
Preoperative diagnosis: BPH with LUTS (urinary frequency, urgency, weak urinary stream, nocturia)  Postoperative diagnosis: BPH with LUTS  Procedure: Cystoscopy with Urolift (placement of 8 implants; 6 deployed, 2 attempted)  Surgeon: Bernardo Heater  Anesthesia: LMA  Complications: None  Drains: None  Estimated blood loss: ~15 mL  Indications: 79 y.o. male with obstructive symptomatology secondary to BPH.  Prior PVP 2008.  The patient's symptoms have progressed, and he has requested further management.  Management options including TURP with resection/ablation of the prostate as well as Urolift were discussed.  The patient has chosen to have a Urolift procedure.  He has been instructed to the procedure as well as risks and complications which include but are not limited to infection, bleeding, and inadequate treatment with the Urolift procedure alone, anesthetic complications, among others.  He understands these and desires to proceed.  Findings: Using the 17 French cystoscope, urethra and bladder were inspected.  There were no urethral lesions.  Prostatic urethra remarkable for an open bladder neck and proximal prostatic urethra.  Adenoma regrowth mid to distal prostate.  Adenoma extending distal to verumontanum.  The bladder was inspected circumferentially.  This revealed normal findings.  Description of procedure: The patient was properly identified in the holding area.  He received preoperative IV antibiotics.  He was taken to the operating room where general anesthetic was administered with the LMA.  He is placed in the dorsolithotomy position.  External genitalia were prepped and draped.  Proper timeout was performed.  A 79F cystoscope was inserted into the bladder. The cystoscopy bridge was replaced with a UroLift delivery device.  The 1st pair of implants were placed in the midportion of the prostatic urethra at the most proximal portion of adenoma regrowth.  The first implant attempted was a  pull-through and the second attempt was successful with providing additional compression after needle appointment.  The third implant was placed anteriorly on the contralateral mid prostate.  The fourth and fifth implants were placed just proximal to the verumontanum.  Repeat cystoscopy was performed with good development of an anterior channel.  The sixth and seventh implants were placed between the most proximal and distal implants however the 7 implant on the right side was another pull-through.  The second attempt with the 8th implant was successful.    A final cystoscopy was conducted first to inspect the location and state of each implant and second, to confirm the presence of a continuous anterior channel was present through the prostatic urethra with irrigation flow turned off.  No injury to the bladder or urethra was detected.  8 implants (6 deployed/2 attempted) were delivered in total.   Moderate oozing was noted throughout the case and it was elected to place an 22 French Foley catheter which with irrigation was pink-tinged.  He tolerated procedure well and after anesthetic reversal was transported to the PACU in stable condition.  Plan: He will be reexamined prior to discharge to determine whether he will be sent home with an indwelling Foley.   Talyia Allende [employer]. MD

## 2020-01-13 NOTE — Anesthesia Procedure Notes (Signed)
Performed by: Gaynelle Cage, CRNA

## 2020-01-13 NOTE — Discharge Instructions (Addendum)
Indwelling Urinary Catheter Care, Adult An indwelling urinary catheter is a thin tube that is put into your bladder. The tube helps to drain pee (urine) out of your body. The tube goes in through your urethra. Your urethra is where pee comes out of your body. Your pee will come out through the catheter, then it will go into a bag (drainage bag). Take good care of your catheter so it will work well. How to wear your catheter and bag Supplies needed  Sticky tape (adhesive tape) or a leg strap.  Alcohol wipe or soap and water (if you use tape).  A clean towel (if you use tape).  Large overnight bag.  Smaller bag (leg bag). Wearing your catheter Attach your catheter to your leg with tape or a leg strap.  Make sure the catheter is not pulled tight.  If a leg strap gets wet, take it off and put on a dry strap.  If you use tape to hold the bag on your leg: 1. Use an alcohol wipe or soap and water to wash your skin where the tape made it sticky before. 2. Use a clean towel to pat-dry that skin. 3. Use new tape to make the bag stay on your leg. Wearing your bags You should have been given a large overnight bag.  You may wear the overnight bag in the day or night.  Always have the overnight bag lower than your bladder.  Do not let the bag touch the floor.  Before you go to sleep, put a clean plastic bag in a wastebasket. Then hang the overnight bag inside the wastebasket. You should also have a smaller leg bag that fits under your clothes.  Always wear the leg bag below your knee.  Do not wear your leg bag at night. How to care for your skin and catheter Supplies needed  A clean washcloth.  Water and mild soap.  A clean towel. Caring for your skin and catheter      Clean the skin around your catheter every day: 1. Wash your hands with soap and water. 2. Wet a clean washcloth in warm water and mild soap. 3. Clean the skin around your urethra.  If you are  male:  Gently spread the folds of skin around your vagina (labia).  With the washcloth in your other hand, wipe the inner side of your labia on each side. Wipe from front to back.  If you are male:  Pull back any skin that covers the end of your penis (foreskin).  With the washcloth in your other hand, wipe your penis in small circles. Start wiping at the tip of your penis, then move away from the catheter.  Move the foreskin back in place, if needed. 4. With your free hand, hold the catheter close to where it goes into your body.  Keep holding the catheter during cleaning so it does not get pulled out. 5. With the washcloth in your other hand, clean the catheter.  Only wipe downward on the catheter.  Do not wipe upward toward your body. Doing this may push germs into your urethra and cause infection. 6. Use a clean towel to pat-dry the catheter and the skin around it. Make sure to wipe off all soap. 7. Wash your hands with soap and water.  Shower every day. Do not take baths.  Do not use cream, ointment, or lotion on the area where the catheter goes into your body, unless your doctor tells  you to.  Do not use powders, sprays, or lotions on your genital area.  Check your skin around the catheter every day for signs of infection. Check for: ? Redness, swelling, or pain. ? Fluid or blood. ? Warmth. ? Pus or a bad smell. How to empty the bag Supplies needed  Rubbing alcohol.  Gauze pad or cotton ball.  Tape or a leg strap. Emptying the bag Pour the pee out of your bag when it is ?- full, or at least 2-3 times a day. Do this for your overnight bag and your leg bag. 1. Wash your hands with soap and water. 2. Separate (detach) the bag from your leg. 3. Hold the bag over the toilet or a clean pail. Keep the bag lower than your hips and bladder. This is so the pee (urine) does not go back into the tube. 4. Open the pour spout. It is at the bottom of the bag. 5. Empty the  pee into the toilet or pail. Do not let the pour spout touch any surface. 6. Put rubbing alcohol on a gauze pad or cotton ball. 7. Use the gauze pad or cotton ball to clean the pour spout. 8. Close the pour spout. 9. Attach the bag to your leg with tape or a leg strap. 10. Wash your hands with soap and water. Follow instructions for cleaning the drainage bag:  From the product maker.  As told by your doctor. How to change the bag Supplies needed  Alcohol wipes.  A clean bag.  Tape or a leg strap. Changing the bag Replace your bag when it starts to leak, smell bad, or look dirty. 1. Wash your hands with soap and water. 2. Separate the dirty bag from your leg. 3. Pinch the catheter with your fingers so that pee does not spill out. 4. Separate the catheter tube from the bag tube where these tubes connect (at the connection valve). Do not let the tubes touch any surface. 5. Clean the end of the catheter tube with an alcohol wipe. Use a different alcohol wipe to clean the end of the bag tube. 6. Connect the catheter tube to the tube of the clean bag. 7. Attach the clean bag to your leg with tape or a leg strap. Do not make the bag tight on your leg. 8. Wash your hands with soap and water. General rules   Never pull on your catheter. Never try to take it out. Doing that can hurt you.  Always wash your hands before and after you touch your catheter or bag. Use a mild, fragrance-free soap. If you do not have soap and water, use hand sanitizer.  Always make sure there are no twists or bends (kinks) in the catheter tube.  Always make sure there are no leaks in the catheter or bag.  Drink enough fluid to keep your pee pale yellow.  Do not take baths, swim, or use a hot tub.  If you are male, wipe from front to back after you poop (have a bowel movement). Contact a doctor if:  Your pee is cloudy.  Your pee smells worse than usual.  Your catheter gets clogged.  Your catheter  leaks.  Your bladder feels full. Get help right away if:  You have redness, swelling, or pain where the catheter goes into your body.  You have fluid, blood, pus, or a bad smell coming from the area where the catheter goes into your body.  Your skin feels warm  where the catheter goes into your body.  You have a fever.  You have pain in your: ? Belly (abdomen). ? Legs. ? Lower back. ? Bladder.  You see blood in the catheter.  Your pee is pink or red.  You feel sick to your stomach (nauseous).  You throw up (vomit).  You have chills.  Your pee is not draining into the bag.  Your catheter gets pulled out. Summary  An indwelling urinary catheter is a thin tube that is placed into the bladder to help drain pee (urine) out of the body.  The catheter is placed into the part of the body that drains pee from the bladder (urethra).  Taking good care of your catheter will keep it working properly and help prevent problems.  Always wash your hands before and after touching your catheter or bag.  Never pull on your catheter or try to take it out. This information is not intended to replace advice given to you by your health care provider. Make sure you discuss any questions you have with your health care provider. Document Revised: 10/25/2018 Document Reviewed: 02/16/2017 Elsevier Patient Education  2020 [place]   1) The drugs that you were given will stay in your system until tomorrow so for the next 24 hours you should not:  A) Drive an automobile B) Make any legal decisions C) Drink any alcoholic beverage   2) You may resume regular meals tomorrow.  Today it is better to start with liquids and gradually work up to solid foods.  You may eat anything you prefer, but it is better to start with liquids, then soup and crackers, and gradually work up to solid foods.   3) Please notify your doctor immediately if  you have any unusual bleeding, trouble breathing, redness and pain at the surgery site, drainage, fever, or pain not relieved by medication.    4) Additional Instructions:        Please contact your physician with any problems or Same Day Surgery at [phone], Monday through Friday 6 am to 4 pm, or [place] at [facility] number at [phone].Urolift Post-Operative Instructions     Patient Expectations   1. Mild blood in your urine for about 1 week.  2. Urinary buring, frequency, and urgency for 10-14 days.  3. Mild pelvic pain 1-2 weeks.     Return to Activity     1. Drink water post procedure.  2. Take meds as needed.  Tylenol and/or Motrin is most helpful.  You may also by Pyridium/Azo over-the-counter for urinary burning.  Continue taking any prostate medications until your first postoperative visit.  A prescription for Uribel was sent to your pharmacy if the over-the-counter medications do not help your symptoms.  3. No lifting or straining 48hrs.  4. Other activity when they feel up to it.  5.  Call our office [phone]) [phone] for any questions or concerns.

## 2020-01-13 NOTE — OR Nursing (Signed)
Pt discharged home with foley; teaching including how to empty bag and how to remove foley cath.  Pt. Demonstrated appropriate technique with return demonstration while emptying bag.  Patient aware he call office in am if he feels uncomfortable removing foley himself.

## 2020-01-13 NOTE — Transfer of Care (Signed)
Immediate Anesthesia Transfer of Care Note  Patient: Bradley [name]  Procedure(s) Performed: CYSTOSCOPY WITH INSERTION OF UROLIFT (N/A )  Patient Location: PACU  Anesthesia Type:General  Level of Consciousness: sedated  Airway & Oxygen Therapy: Patient Spontanous Breathing and Patient connected to face mask oxygen  Post-op Assessment: Report given to RN and Post -op Vital signs reviewed and stable  Post vital signs: Reviewed and stable  Last Vitals:  Vitals Value Taken Time  BP 159/84 01/13/20 1241  Temp    Pulse 54 01/13/20 1243  Resp 11 01/13/20 1243  SpO2 100 % 01/13/20 1243  Vitals shown include unvalidated device data.  Last Pain:  Vitals:   01/13/20 0946  TempSrc: Tympanic  PainSc: 0-No pain         Complications: No complications documented.

## 2020-01-13 NOTE — Anesthesia Procedure Notes (Signed)
Procedure Name: LMA Insertion Performed by: Gaynelle Cage, CRNA Pre-anesthesia Checklist: Patient identified, Emergency Drugs available, Suction available and Patient being monitored Patient Re-evaluated:Patient Re-evaluated prior to induction Oxygen Delivery Method: Circle system utilized Preoxygenation: Pre-oxygenation with 100% oxygen Induction Type: IV induction LMA: LMA inserted LMA Size: 5.0 Tube type: Oral Number of attempts: 1 Placement Confirmation: positive ETCO2,  breath sounds checked- equal and bilateral and CO2 detector Tube secured with: Tape Dental Injury: Teeth and Oropharynx as per pre-operative assessment

## 2020-01-13 NOTE — Anesthesia Postprocedure Evaluation (Signed)
Anesthesia Post Note  Patient: Bradley [name]  Procedure(s) Performed: CYSTOSCOPY WITH INSERTION OF UROLIFT (N/A )  Patient location during evaluation: PACU Anesthesia Type: General Level of consciousness: awake and alert Pain management: pain level controlled Vital Signs Assessment: post-procedure vital signs reviewed and stable Respiratory status: spontaneous breathing, nonlabored ventilation, respiratory function stable and patient connected to nasal cannula oxygen Cardiovascular status: blood pressure returned to baseline and stable Postop Assessment: no apparent nausea or vomiting Anesthetic complications: no   No complications documented.   Last Vitals:  Vitals:   01/13/20 1315 01/13/20 1323  BP: (!) 147/83 (!) 150/83  Pulse: (!) 51 (!) 53  Resp: 14 16  Temp:  (!) 36 C  SpO2: 100% 98%    Last Pain:  Vitals:   01/13/20 1323  TempSrc: Temporal  PainSc: 0-No pain                 Arita Miss

## 2020-01-13 NOTE — Interval H&P Note (Signed)
History and Physical Interval Note:  01/13/2020 11:30 AM  Bradley [name]  has presented today for surgery, with the diagnosis of benign prostatic hypertrophy with urinary obstruction.  The various methods of treatment have been discussed with the patient and family. After consideration of risks, benefits and other options for treatment, the patient has consented to  Procedure(s): CYSTOSCOPY WITH INSERTION OF UROLIFT (N/A) as a surgical intervention.  The patient's history has been reviewed, patient examined, no change in status, stable for surgery.  I have reviewed the patient's chart and labs.  Questions were answered to the patient's satisfaction.     [place]

## 2020-01-14 ENCOUNTER — Encounter: Payer: Self-pay | Admitting: Urology

## 2020-01-14 ENCOUNTER — Telehealth: Payer: Self-pay | Admitting: Radiology

## 2020-01-14 NOTE — Telephone Encounter (Signed)
Patient aware he can stop both medications.

## 2020-01-14 NOTE — Telephone Encounter (Signed)
Patient asks if he should continue finasteride post Urolift. If so he will need a refill. He would also like to discontinue oxybutynin due to dry mouth. Please advise.

## 2020-01-14 NOTE — Telephone Encounter (Signed)
He can stop both

## 2020-01-15 ENCOUNTER — Ambulatory Visit (INDEPENDENT_AMBULATORY_CARE_PROVIDER_SITE_OTHER): Payer: Medicare Other | Admitting: Urology

## 2020-01-15 ENCOUNTER — Other Ambulatory Visit: Payer: Self-pay

## 2020-01-15 ENCOUNTER — Encounter: Payer: Self-pay | Admitting: Urology

## 2020-01-15 ENCOUNTER — Telehealth: Payer: Self-pay | Admitting: Radiology

## 2020-01-15 VITALS — BP 140/80 | Ht 74.0 in | Wt 226.0 lb

## 2020-01-15 DIAGNOSIS — R338 Other retention of urine: Secondary | ICD-10-CM | POA: Diagnosis not present

## 2020-01-15 LAB — BLADDER SCAN AMB NON-IMAGING: Scan Result: >792

## 2020-01-15 MED ORDER — TAMSULOSIN HCL 0.4 MG PO CAPS
0.4000 mg | ORAL_CAPSULE | Freq: Every day | ORAL | 3 refills | Status: DC
Start: 2020-01-15 — End: 2020-04-09

## 2020-01-15 NOTE — Progress Notes (Signed)
01/15/2020 10:43 AM   Bradley [name] [birth year] [id number]  Referring provider: Sofie Hartigan, MD [place],  [place] [address]  Chief Complaint  Patient presents with  . Urinary Retention    HPI: Bradley [name] is a 79 y.o. male with BPH with LU TS, elevated PSA and ED who is status post UroLift procedure who presents today with an inability to void.  Patient underwent a successful UroLift procedure on 01/13/2020 with Dr. Bernardo Heater.   He states yesterday he remove the Foley catheter and was voiding well.  He states during the evening though he started to experience urinary frequency, urgency and dribbling.  This morning, he has been voiding amounts of about 300 cc a few times.  His bladder scan was over 700 cc, but he voided into an urinal after the scan for a volume of 300 cc.  Patient denies any modifying or aggravating factors.  Patient denies any gross hematuria, dysuria or suprapubic/flank pain.  Patient denies any fevers, chills, nausea or vomiting.   He does not want an indwelling Foley.  He is not taking his oxybutynin or his alpha-blocker at this time.   PMH: Past Medical History:  Diagnosis Date  . Allergy   . Barrett's esophagus   . Blockage of coronary artery of heart ([place])   . BPH (benign prostatic hyperplasia)   . Cataracts, bilateral   . Chronic diastolic CHF (congestive heart failure), NYHA class 2 ([place]) 12/16/2014  . Colon polyp   . Coronary artery disease   . DDD (degenerative disc disease), lumbosacral 12/05/2017  . Diabetes mellitus without complication ([place])    usually running low. to see endocrinologist 01/2020   . Essential hypertension 06/05/2014  . GERD (gastroesophageal reflux disease)    Barretts esophagus  . GI bleed    a. ~2012 around time of hiatal hernia surgery. 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  . Glaucoma   . Glaucoma   . Hemorrhoids   . History of chicken pox   . History of hiatal hernia 2012  .  Hyperlipidemia   . Meningioma ([place])    stable, followed by yearly MRIs  . Migraines   . Neuromuscular disorder ([facility])    neuropathies of lower extrems  . RBBB    also, AF and bradycardia  . Stomach ulcer     Surgical History: Past Surgical History:  Procedure Laterality Date  . ATHERECTOMY  1992  . CARDIAC CATHETERIZATION N/A 01/07/2015   Procedure: Left Heart Cath;  Surgeon: Corey Skains, MD;  Location: [facility];  Service: Cardiovascular;  Laterality: N/A;  . CARDIAC SURGERY     coronary angioplasty  . CHOLECYSTECTOMY    . COLONOSCOPY  11/03/08, 03/09/14  . CORONARY ANGIOPLASTY    . CORONARY ARTERY BYPASS GRAFT N/A 01/19/2015   Procedure: CORONARY ARTERY BYPASS GRAFTING (CABG), with LIM to LAD, vein to left intermediate, vein to PD, vein to OM, using right greater saphenous vein via endovein harvest.;  Surgeon: Grace Isaac, MD;  Location: [facility];  Service: Open Heart Surgery;  Laterality: N/A;  . CYSTOSCOPY WITH INSERTION OF UROLIFT N/A 01/13/2020   Procedure: CYSTOSCOPY WITH INSERTION OF UROLIFT;  Surgeon: Abbie Sons, MD;  Location: [facility];  Service: Urology;  Laterality: N/A;  . DIAGNOSTIC LAPAROSCOPY     cholecystectomy and hiatal hernia repair  . ENDOVEIN HARVEST OF GREATER SAPHENOUS VEIN Right 01/19/2015   Procedure: ENDOVEIN HARVEST OF GREATER SAPHENOUS VEIN;  Surgeon: Grace Isaac, MD;  Location: [facility];  Service: Open Heart Surgery;  Laterality: Right;  . ESOPHAGOGASTRODUODENOSCOPY  02/19/13  . ESOPHAGOGASTRODUODENOSCOPY (EGD) WITH PROPOFOL N/A 08/09/2015   Procedure: ESOPHAGOGASTRODUODENOSCOPY (EGD) WITH PROPOFOL;  Surgeon: Hulen Luster, MD;  Location: [facility];  Service: Gastroenterology;  Laterality: N/A;  . ESOPHAGOGASTRODUODENOSCOPY (EGD) WITH PROPOFOL N/A 04/13/2017   Procedure: ESOPHAGOGASTRODUODENOSCOPY (EGD) WITH PROPOFOL;  Surgeon: Toledo, Benay Pike, MD;  Location: [facility];  Service: Endoscopy;  Laterality: N/A;  . ESOPHAGUS SURGERY     . EYE SURGERY     cataracts BIL  . HERNIA REPAIR    . HIATAL HERNIA REPAIR N/A   . POLYPECTOMY    . TEE WITHOUT CARDIOVERSION N/A 01/19/2015   Procedure: TRANSESOPHAGEAL ECHOCARDIOGRAM (TEE);  Surgeon: Grace Isaac, MD;  Location: [facility];  Service: Open Heart Surgery;  Laterality: N/A;    Home Medications:  Allergies as of 01/15/2020      Reactions   Metoprolol Other (See Comments)   Hypotension   Sulfadiazine Other (See Comments)   unknown   Oxycodone Other (See Comments)   Reaction:  Hypotension    Sulfa Antibiotics Rash      Medication List       Accurate as of January 15, 2020 10:43 AM. If you have any questions, ask your nurse or doctor.        acetaminophen 500 MG tablet Commonly known as: TYLENOL Take 500 mg by mouth every 6 (six) hours as needed for mild pain or moderate pain.   aspirin EC 81 MG tablet Take 81 mg by mouth daily.   cetirizine 10 MG tablet Commonly known as: ZYRTEC Take 10 mg by mouth daily.   chlorhexidine 0.12 % solution Commonly known as: PERIDEX Use as directed 15 mLs in the mouth or throat 2 (two) times daily as needed (for gum inflammation).   cyanocobalamin 2000 MCG tablet Take 2,000 mcg by mouth daily.   diphenhydrAMINE 25 mg capsule Commonly known as: BENADRYL Take 25 mg by mouth every 6 (six) hours as needed for itching.   dutasteride 0.5 MG capsule Commonly known as: AVODART Take 1 capsule (0.5 mg total) by mouth daily.   eucerin cream Apply 1 application topically daily as needed for dry skin.   hydrocortisone 2.5 % rectal cream Commonly known as: ANUSOL-HC Place 1 application rectally daily as needed for hemorrhoids.   hydrocortisone cream 1 % Apply 1 application topically 3 (three) times daily as needed for itching.   latanoprost 0.005 % ophthalmic solution Commonly known as: XALATAN Place 1 drop into both eyes at bedtime.   Omeprazole 20 MG Tbdd Take 20 mg by mouth daily.   omeprazole 20 MG capsule Commonly  known as: PRILOSEC Take 20 mg by mouth daily.   pravastatin 20 MG tablet Commonly known as: PRAVACHOL Take 20 mg by mouth daily.   silodosin 8 MG Caps capsule Commonly known as: RAPAFLO Take by mouth.   tamsulosin 0.4 MG Caps capsule Commonly known as: FLOMAX Take 1 capsule (0.4 mg total) by mouth daily. Started by: Zara Council, PA-C   timolol 0.25 % ophthalmic solution Commonly known as: BETIMOL Place 1 drop into both eyes daily.   triamcinolone cream 0.1 % Commonly known as: KENALOG Apply 1 application topically 2 (two) times daily as needed (Foot).   Uribel 118 MG Caps Take 1 capsule (118 mg total) by mouth 3 (three) times daily as needed (Urinary frequency, urgency, burning).   V-R MAGNESIUM 250 MG  Tabs Generic drug: Magnesium Take 250 mg by mouth daily.       Allergies:  Allergies  Allergen Reactions  . Metoprolol Other (See Comments)    Hypotension  . Sulfadiazine Other (See Comments)    unknown  . Oxycodone Other (See Comments)    Reaction:  Hypotension   . Sulfa Antibiotics Rash    Family History: Family History  Problem Relation Age of Onset  . Parkinsonism Mother   . Kidney disease Father   . Cancer Father        prostate  . Heart disease Father   . Cancer Paternal Uncle        esophageal  . Cancer Paternal Uncle     Social History:  reports that he has never smoked. He has never used smokeless tobacco. He reports that he does not drink alcohol and does not use drugs.  ROS: Pertinent ROS in HPI  Physical Exam: BP 140/80   Ht 6\' 2"  (1.88 m)   Wt 226 lb (102.5 kg)   BMI 29.02 kg/m   Constitutional:  Well nourished. Alert and oriented, No acute distress. HEENT: [place] AT, mask in place.  Trachea midline Cardiovascular: No clubbing, cyanosis, or edema. Respiratory: Normal respiratory effort, no increased work of breathing. GU: No CVA tenderness.  No bladder fullness or masses.  Patient with uncircumcised phallus.  Foreskin easily  retracted  Urethral meatus is patent.  No penile discharge. No penile lesions or rashes. Scrotum without lesions, cysts, rashes and/or edema.  Neurologic: Grossly intact, no focal deficits, moving all 4 extremities. Psychiatric: Normal mood and affect.  Laboratory Data: Lab Results  Component Value Date   WBC 8.8 01/11/2020   HGB 15.4 01/11/2020   HCT 45.9 01/11/2020   MCV 98.5 01/11/2020   PLT 232 01/11/2020    Lab Results  Component Value Date   CREATININE 1.54 (H) 01/11/2020    Lab Results  Component Value Date   HGBA1C 5.9 (H) 01/15/2015    Lab Results  Component Value Date   TSH 0.968 01/25/2015    Lab Results  Component Value Date   AST 15 11/08/2015   Lab Results  Component Value Date   ALT 14 (L) 11/08/2015    Urinalysis    Component Value Date/Time   COLORURINE YELLOW (A) 01/11/2020 1712   APPEARANCEUR CLEAR (A) 01/11/2020 1712   APPEARANCEUR Clear 06/18/2019 1058   LABSPEC 1.018 01/11/2020 1712   LABSPEC 1.025 08/25/2011 1430   PHURINE 6.0 01/11/2020 1712   GLUCOSEU NEGATIVE 01/11/2020 1712   GLUCOSEU Negative 08/25/2011 1430   HGBUR NEGATIVE 01/11/2020 1712   BILIRUBINUR NEGATIVE 01/11/2020 1712   BILIRUBINUR Negative 06/18/2019 1058   BILIRUBINUR Negative 08/25/2011 1430   KETONESUR NEGATIVE 01/11/2020 1712   PROTEINUR NEGATIVE 01/11/2020 1712   UROBILINOGEN 0.2 01/15/2015 1041   NITRITE NEGATIVE 01/11/2020 1712   LEUKOCYTESUR NEGATIVE 01/11/2020 1712   LEUKOCYTESUR Negative 08/25/2011 1430   I have reviewed the labs.   Pertinent Imaging: Results for HYDER, DEMAN (MRN [id number]) as of 01/15/2020 10:41  Ref. Range 01/15/2020 10:01  Scan Result Unknown >792 ML   Procedure Continuous Intermittent Catheterization Due to urinary retention patient is present today for a teaching of self I & O Catheterization. Patient was given detailed verbal and printed instructions of self catheterization. Patient was cleaned and prepped in a sterile fashion.   With instruction and assistance patient inserted a 14 FR SpeediCath and urine return was noted 500 ml, urine was blue clear  in color. Patient tolerated well, no complications were noted Patient was given a sample bag with supplies to take home.  Instructions were given per me for patient to cath to keep PVR under 300 cc  times daily.  I sent samples of the SpeediCath with him.  I did not send in a prescription for the SpeediCath as hopefully this will be a self-limiting issue.    Performed by: Nori Riis, PA-C   Assessment & Plan:    1. Acute urinary retention Patient was very hesitant to have an indwelling Foley, but I was able to instruct him in self-catheterization He will catheterize himself to keep his residuals 300 or less, samples were given as a suspect this is a self-limiting problem and will improve with time I have sent a prescription to his pharmacy for tamsulosin 0.4 mg daily He will follow-up as scheduled with Dr. Bernardo Heater on July 30  Return for Keep follow up with Dr. Bernardo Heater.  These notes generated with voice recognition software. I apologize for typographical errors.  Zara Council, PA-C  [facility] [address]  [place], [place] [address] [phone]  I spent 15 minutes on the day of the encounter to include pre-visit record review, face-to-face time with the patient, and post-visit ordering of tests.

## 2020-01-15 NOTE — Telephone Encounter (Signed)
Patient reports inability to urinate beyond a dribble after removing catheter yesterday post Urolift on Tuesday. Advised patient to come to the office immediately. Patient verbalized understanding.

## 2020-01-16 ENCOUNTER — Telehealth: Payer: Self-pay | Admitting: Radiology

## 2020-01-16 NOTE — Telephone Encounter (Signed)
Per Bradley Hoff, PA-C, explained that Bradley [name] is mainly for pain with urination and can be used as needed for such. Advised patient that although he feels like he is emptying completely, the urgency could indicate that he is not. Advised patient to self cath after urinating. If there is residual he should self cath occasionally to be sure he is emptying completely. Patient expresses understanding.

## 2020-01-16 NOTE — Telephone Encounter (Signed)
Patient asks if he should keep taking Uribel since he experienced urinary retention yesterday. He states he is able to urinate 150-200cc every 1-2 hours without self cathing and feels like he is emptying his bladder but he is having severe urgency with leakage if he doesn't get to the bathroom immediately. He has not self cathed today. Please advise.

## 2020-01-18 ENCOUNTER — Other Ambulatory Visit: Payer: Self-pay

## 2020-01-18 ENCOUNTER — Encounter: Payer: Self-pay | Admitting: Emergency Medicine

## 2020-01-18 ENCOUNTER — Ambulatory Visit
Admission: EM | Admit: 2020-01-18 | Discharge: 2020-01-18 | Disposition: A | Discharge status: Home / Self Care | Payer: Medicare Other | Attending: Internal Medicine | Admitting: Internal Medicine

## 2020-01-18 DIAGNOSIS — K644 Residual hemorrhoidal skin tags: Secondary | ICD-10-CM

## 2020-01-18 DIAGNOSIS — K5909 Other constipation: Secondary | ICD-10-CM | POA: Diagnosis present

## 2020-01-18 DIAGNOSIS — N390 Urinary tract infection, site not specified: Secondary | ICD-10-CM

## 2020-01-18 DIAGNOSIS — J392 Other diseases of pharynx: Secondary | ICD-10-CM | POA: Diagnosis present

## 2020-01-18 LAB — URINALYSIS, COMPLETE (UACMP) WITH MICROSCOPIC
Bilirubin Urine: NEGATIVE
Glucose, UA: NEGATIVE mg/dL
Ketones, ur: NEGATIVE mg/dL
Nitrite: NEGATIVE
Protein, ur: >300 mg/dL — AB
RBC / HPF: >50 RBC/hpf (ref 0–5)
Specific Gravity, Urine: 1.02 (ref 1.005–1.030)
Squamous Epithelial / HPF: NONE SEEN (ref 0–5)
WBC, UA: >50 WBC/hpf (ref 0–5)
pH: 7 (ref 5.0–8.0)

## 2020-01-18 MED ORDER — HYDROCORTISONE 1 % EX CREA: 1.0000 "application " | TOPICAL_CREAM | Freq: Three times a day (TID) | CUTANEOUS | 0 refills | Status: DC | PRN

## 2020-01-18 MED ORDER — DOCUSATE SODIUM 100 MG PO CAPS: 100.0000 mg | ORAL_CAPSULE | Freq: Two times a day (BID) | ORAL | 0 refills | Status: AC

## 2020-01-18 MED ORDER — HYDROCORTISONE ACETATE 25 MG RE SUPP
25.0000 mg | Freq: Two times a day (BID) | RECTAL | 1 refills | Status: DC
Start: 2020-01-18 — End: 2020-04-09

## 2020-01-18 MED ORDER — CIPROFLOXACIN HCL 250 MG PO TABS
250.0000 mg | ORAL_TABLET | Freq: Two times a day (BID) | ORAL | 0 refills | Status: DC
Start: 2020-01-18 — End: 2020-04-09

## 2020-01-18 NOTE — ED Provider Notes (Signed)
[facility]    CSN: [id number] Arrival date & time: 01/18/20  1017      History   Chief Complaint Chief Complaint  Patient presents with  . Hemorrhoids  . Constipation    HPI Bradley [name] is a 79 y.o. male. who presents with multiple complaints: 1- He had a urolift surgery 4 days ago and has been having dysuria and frequency since then. He was supposed to self cath for a few days after the surgery since he did not want to wear a folly, but has not done it. Tried once and the catheter came out as he was trying to hold the bag. Has been logging his urinary output and has been emptying 100-170 most times, initially was every hour, but now is down to q 2.5h. He does not feel he is not emptying his bladder. Has been taking Uro-Mr, but stopped it 2 days ago. Has noticed strong odor of his urine a couple of days ago. Denies hematuria, except for the one time he tried to cath himself.   2- He also has a sore place on his  Throat since the day of the surgery and wonders if it was from the intubation. Has been gargling with salt water and is not getting any worse and is not too bothersome.   3- Has a hemorrhoid acting up and has not had a good BM in 4 days. Has had a couple of firm stools but not very hard ones, but when he strains to pass, the external hemorrhoid pops out which is not unusual for him. Has been using the hydrocortisone cream prescribed last time and is running out. Has only mild pain. Has not had rectal bleeding. Denies abdominal pain or distention.     Past Medical History:  Diagnosis Date  . Allergy   . Barrett's esophagus   . Blockage of coronary artery of heart ([place])   . BPH (benign prostatic hyperplasia)   . Cataracts, bilateral   . Chronic diastolic CHF (congestive heart failure), NYHA class 2 ([place]) 12/16/2014  . Colon polyp   . Coronary artery disease   . DDD (degenerative disc disease), lumbosacral 12/05/2017  . Diabetes mellitus without complication  ([place])    usually running low. to see endocrinologist 01/2020   . Essential hypertension 06/05/2014  . GERD (gastroesophageal reflux disease)    Barretts esophagus  . GI bleed    a. ~2012 around time of hiatal hernia surgery. 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  . Glaucoma   . Glaucoma   . Hemorrhoids   . History of chicken pox   . History of hiatal hernia 2012  . Hyperlipidemia   . Meningioma ([place])    stable, followed by yearly MRIs  . Migraines   . Neuromuscular disorder (H[facility])    neuropathies of lower extrems  . RBBB    also, AF and bradycardia  . Stomach ulcer     Patient Active Problem List   Diagnosis Date Noted  . Bradycardia 02/13/2019  . Meningioma ([place]) 05/30/2018  . External hemorrhoids 04/25/2018  . Incontinence of feces 04/25/2018  . DDD (degenerative disc disease), cervical 03/07/2018  . Glaucoma (increased eye pressure) 12/19/2017  . DDD (degenerative disc disease), lumbosacral 12/05/2017  . Urinary frequency 06/21/2017  . Urinary urgency 06/21/2017  . Bilateral carotid artery stenosis 04/11/2017  . Atrial fibrillation ([place]) 01/30/2015  . 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 Stable angina ([place]) 01/07/2015  . Chronic diastolic CHF (  congestive heart failure), NYHA class 2 ([place]) 12/16/2014  . Arteriosclerosis of coronary artery 12/10/2014  . GERD (gastroesophageal reflux disease) 06/16/2014  . Benign essential HTN 06/05/2014  . Mixed hyperlipidemia 06/05/2014  . Moderate mitral insufficiency 06/05/2014  . Chest pain 01/12/2014  . Angina pectoris ([place]) 10/09/2013  . 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 Esophageal disease 10/09/2013  . Benign prostatic hyperplasia with urinary obstruction 04/01/2012  . 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 ED (erectile dysfunction) of organic origin 04/01/2012  . Elevated prostate specific antigen (PSA) 04/01/2012  . Benign neoplasm of cerebral meninges ([place]) 04/24/2011    Past Surgical History:   Procedure Laterality Date  . ATHERECTOMY  1992  . CARDIAC CATHETERIZATION N/A 01/07/2015   Procedure: Left Heart Cath;  Surgeon: Corey Skains, MD;  Location: [facility];  Service: Cardiovascular;  Laterality: N/A;  . CARDIAC SURGERY     coronary angioplasty  . CHOLECYSTECTOMY    . COLONOSCOPY  11/03/08, 03/09/14  . CORONARY ANGIOPLASTY    . CORONARY ARTERY BYPASS GRAFT N/A 01/19/2015   Procedure: CORONARY ARTERY BYPASS GRAFTING (CABG), with LIM to LAD, vein to left intermediate, vein to PD, vein to OM, using right greater saphenous vein via endovein harvest.;  Surgeon: Grace Isaac, MD;  Location: [facility];  Service: Open Heart Surgery;  Laterality: N/A;  . CYSTOSCOPY WITH INSERTION OF UROLIFT N/A 01/13/2020   Procedure: CYSTOSCOPY WITH INSERTION OF UROLIFT;  Surgeon: Abbie Sons, MD;  Location: [facility];  Service: Urology;  Laterality: N/A;  . DIAGNOSTIC LAPAROSCOPY     cholecystectomy and hiatal hernia repair  . ENDOVEIN HARVEST OF GREATER SAPHENOUS VEIN Right 01/19/2015   Procedure: ENDOVEIN HARVEST OF GREATER SAPHENOUS VEIN;  Surgeon: Grace Isaac, MD;  Location: [facility];  Service: Open Heart Surgery;  Laterality: Right;  . ESOPHAGOGASTRODUODENOSCOPY  02/19/13  . ESOPHAGOGASTRODUODENOSCOPY (EGD) WITH PROPOFOL N/A 08/09/2015   Procedure: ESOPHAGOGASTRODUODENOSCOPY (EGD) WITH PROPOFOL;  Surgeon: Hulen Luster, MD;  Location: [facility];  Service: Gastroenterology;  Laterality: N/A;  . ESOPHAGOGASTRODUODENOSCOPY (EGD) WITH PROPOFOL N/A 04/13/2017   Procedure: ESOPHAGOGASTRODUODENOSCOPY (EGD) WITH PROPOFOL;  Surgeon: Toledo, Benay Pike, MD;  Location: [facility];  Service: Endoscopy;  Laterality: N/A;  . ESOPHAGUS SURGERY    . EYE SURGERY     cataracts BIL  . HERNIA REPAIR    . HIATAL HERNIA REPAIR N/A   . POLYPECTOMY    . TEE WITHOUT CARDIOVERSION N/A 01/19/2015   Procedure: TRANSESOPHAGEAL ECHOCARDIOGRAM (TEE);  Surgeon: Grace Isaac, MD;  Location: [facility];  Service:  Open Heart Surgery;  Laterality: N/A;       Home Medications    Prior to Admission medications   Medication Sig Start Date End Date Taking? Authorizing Provider  aspirin EC 81 MG tablet Take 81 mg by mouth daily.   Yes [provider]  cetirizine (ZYRTEC) 10 MG tablet Take 10 mg by mouth daily.    Yes [provider]  chlorhexidine (PERIDEX) 0.12 % solution Use as directed 15 mLs in the mouth or throat 2 (two) times daily as needed (for gum inflammation).    Yes [provider]  cyanocobalamin 2000 MCG tablet Take 2,000 mcg by mouth daily.    Yes [provider]  diphenhydrAMINE (BENADRYL) 25 mg capsule Take 25 mg by mouth every 6 (six) hours as needed for itching.   Yes [provider]  dutasteride (AVODART) 0.5 MG capsule Take 1 capsule (0.5 mg total)  by mouth daily. 10/11/19  Yes Stoioff, Ronda Fairly, MD  hydrocortisone (ANUSOL-HC) 2.5 % rectal cream Place 1 application rectally daily as needed for hemorrhoids.  02/10/19  Yes [provider]  hydrocortisone cream 1 % Apply 1 application topically 3 (three) times daily as needed for itching.    Yes [provider]  latanoprost (XALATAN) 0.005 % ophthalmic solution Place 1 drop into both eyes at bedtime.   Yes [provider]  Magnesium (V-R MAGNESIUM) 250 MG TABS Take 250 mg by mouth daily.    Yes [provider]  Methen-Hyosc-Meth Blue-Na Phos (URO-BLUE PO) Take by mouth.   Yes [provider]  omeprazole (PRILOSEC) 20 MG capsule Take 20 mg by mouth daily. 01/12/20  Yes [provider]  Omeprazole 20 MG TBDD Take 20 mg by mouth daily.  05/01/19  Yes [provider]  pravastatin (PRAVACHOL) 20 MG tablet Take 20 mg by mouth daily.   Yes [provider]  silodosin (RAPAFLO) 8 MG CAPS capsule Take by mouth. 06/18/19  Yes [provider]  tamsulosin (FLOMAX) 0.4 MG CAPS capsule Take 1 capsule (0.4 mg total) by mouth daily.  01/15/20  Yes McGowan, Larene Beach A, PA-C  timolol (BETIMOL) 0.25 % ophthalmic solution Place 1 drop into both eyes daily.   Yes [provider]  acetaminophen (TYLENOL) 500 MG tablet Take 500 mg by mouth every 6 (six) hours as needed for mild pain or moderate pain.    [provider]  Meth-Hyo-M Bl-Na Phos-Ph Sal (URIBEL) 118 MG CAPS Take 1 capsule (118 mg total) by mouth 3 (three) times daily as needed (Urinary frequency, urgency, burning). 01/13/20   Stoioff, Ronda Fairly, MD  Skin Protectants, Misc. (EUCERIN) cream Apply 1 application topically daily as needed for dry skin.    [provider]  triamcinolone cream (KENALOG) 0.1 % Apply 1 application topically 2 (two) times daily as needed (Foot).    [provider]    Family History Family History  Problem Relation Age of Onset  . Parkinsonism Mother   . Kidney disease Father   . Cancer Father        prostate  . Heart disease Father   . Cancer Paternal Uncle        esophageal  . Cancer Paternal Uncle     Social History Social History   Tobacco Use  . Smoking status: Never Smoker  . Smokeless tobacco: Never Used  Vaping Use  . Vaping Use: Never used  Substance Use Topics  . Alcohol use: No    Alcohol/week: 0.0 standard drinks  . Drug use: No     Allergies   Metoprolol, Sulfadiazine, Oxycodone, and Sulfa antibiotics   Review of Systems Review of Systems  Constitutional: Negative for activity change, appetite change, chills, fatigue and fever.  HENT: Positive for mouth sores. Negative for congestion, postnasal drip, rhinorrhea, sore throat, trouble swallowing and voice change.   Gastrointestinal: Positive for constipation and rectal pain. Negative for abdominal pain, anal bleeding, blood in stool and nausea.  Genitourinary: Positive for frequency and urgency. Negative for decreased urine volume, difficulty urinating, dysuria, flank pain and hematuria.       + for strong urine odor   Musculoskeletal: Negative for gait problem and myalgias.  Skin: Negative for rash.  Hematological: Negative for adenopathy.     Physical Exam Triage Vital Signs ED Triage Vitals  Enc Vitals Group     BP 01/18/20 1036 113/70     Pulse Rate  01/18/20 1036 75     Resp 01/18/20 1036 16     Temp 01/18/20 1036 98 F (36.7 C)     Temp Source 01/18/20 1036 Oral     SpO2 01/18/20 1036 99 %     Weight 01/18/20 1032 226 lb (102.5 kg)     Height 01/18/20 1032 6\' 2"  (1.88 m)     Head Circumference --      Peak Flow --      Pain Score 01/18/20 1031 1     Pain Loc --      Pain Edu? --      Excl. in [place]? --    No data found.  Updated Vital Signs BP 113/70 (BP Location: Right Arm)   Pulse 75   Temp 98 F (36.7 C) (Oral)   Resp 16   Ht 6\' 2"  (1.88 m)   Wt 226 lb (102.5 kg)   SpO2 99%   BMI 29.02 kg/m   Visual Acuity Right Eye Distance:   Left Eye Distance:   Bilateral Distance:    Right Eye Near:   Left Eye Near:    Bilateral Near:     Physical Exam Vitals and nursing note reviewed.  Constitutional:      General: He is not in acute distress.    Appearance: He is normal weight. He is not toxic-appearing.  HENT:     Head: Atraumatic.     Right Ear: External ear normal.     Left Ear: External ear normal.     Mouth/Throat:     Mouth: Mucous membranes are moist.     Comments: Has hemorrhagic area on the L of his soft palate which is not tender when I touched it with the tongue depressor, and next to it ans 1/2 cm x 1 cm oval abrasion. There is no erythema around it, it is minimally tender. It does not look infected.  Eyes:     General: No scleral icterus.    Conjunctiva/sclera: Conjunctivae normal.  Pulmonary:     Effort: Pulmonary effort is normal.  Abdominal:     General: Bowel sounds are normal. There is no distension.     Palpations: Abdomen is soft. There is no mass.     Tenderness: There is no abdominal tenderness. There is no right CVA tenderness or left CVA  tenderness.  Genitourinary:    Comments: RECTAL- has a large external hemorrhoid with a small clot at the entrance of the anal region, but none on the rest of the tissue sticking out from anus. Digital exam was normal and was not painful. There was no stool in the cecum.  Musculoskeletal:        General: Normal range of motion.     Cervical back: Neck supple.  Skin:    General: Skin is warm and dry.     Findings: No rash.  Neurological:     Mental Status: He is alert and oriented to person, place, and time.     Gait: Gait normal.  Psychiatric:        Mood and Affect: Mood normal.        Behavior: Behavior normal.        Thought Content: Thought content normal.        Judgment: Judgment normal.      UC Treatments / Results  Labs (all labs ordered are listed, but only abnormal results are displayed) Labs Reviewed  URINALYSIS, COMPLETE (UACMP) WITH MICROSCOPIC    EKG  Radiology No results found.  Procedures Procedures (including critical care time)  Medications Ordered in UC Medications - No data to display  Initial Impression / Assessment and Plan / UC Course  I have reviewed the triage vital signs and the nursing notes. His urine shows infection, so I placed him on Cipro 250 mg bid x 10 days since his creatinine has been a little elevated in the past.  I refilled the hydrocortisone 1% cream to continue applying it on external hemorrhoid. I also prescribed him Anusol suppositories to use bid for 7 days, and advised to do sits bath's.  Needs to try to keep his stools soft so I sent colace to his pharmacy to take qd. Advised to call his urologist and informed him he has a UTI.  He will try gargling throat with Listerine and see if this helps his throat abrasion heal.  I educated him about a thrombosed hemorrhoid and so if he get pain, and worse swelling and bleeding from it, needs to come it to have it drained.  Final Clinical Impressions(s) / UC Diagnoses   Final  diagnoses:  None   Discharge Instructions   None    ED Prescriptions    None     PDMP not reviewed this encounter.   Shelby Mattocks, [place] 01/18/20 1617

## 2020-01-18 NOTE — ED Triage Notes (Signed)
Patient states that he had a urolift surgery on Wed.  Patient reports ongoing urinary frequency and some discomfort when urinating.  Patient also reports an ulcer on the left side of his throat since Wed.  Patient states that he thinks it might happened when they put the breathing tube in.  Patient also reports hemrroids and has not had a bowel movement since Wed.

## 2020-01-18 NOTE — Discharge Instructions (Addendum)
Take colace a stool softner twice a day for 5 days  to prevent hard stools. Do sits baths 2-3 times a day for the next 5 days 15 minutes at a time Continue using the hydrocortisone cream externally. Follow up with your family Dr next week for recheck.  If the swelling becomes very painful make sure you are seen again, sometimes you may develop a clot in the swollen area that may need to be drained.

## 2020-01-20 ENCOUNTER — Telehealth: Payer: Self-pay | Admitting: Radiology

## 2020-01-20 LAB — URINE CULTURE: Culture: >=100000 — AB

## 2020-01-20 NOTE — Telephone Encounter (Signed)
I suspect this represents both residual UTI symptoms that will continue to improve on culture-appropriate Cipro as well as urinary frequency following UroLift, which is also a normal finding. As long as he is emptying his bladder well, which appears to be the case with a PVR <156mL, I recommend continuing antibiotics as prescribed and avoidance of bladder irritants (caffeine, alcohol, spicy food, acidic food, chocolate) with planned follow-up with Dr. Bernardo Heater later this month.

## 2020-01-20 NOTE — Telephone Encounter (Signed)
Patient reports frequency. He voids 100-218ml every 1-3 hours. PVR by self cath is 50 ml. Please advise.

## 2020-01-20 NOTE — Telephone Encounter (Signed)
Notified patient of recommendations from [facility]. Patient verbalized understanding.

## 2020-02-13 ENCOUNTER — Ambulatory Visit (INDEPENDENT_AMBULATORY_CARE_PROVIDER_SITE_OTHER): Payer: Medicare Other | Admitting: Urology

## 2020-02-13 ENCOUNTER — Encounter: Payer: Self-pay | Admitting: Urology

## 2020-02-13 ENCOUNTER — Other Ambulatory Visit: Payer: Self-pay

## 2020-02-13 DIAGNOSIS — N138 Other obstructive and reflux uropathy: Secondary | ICD-10-CM | POA: Diagnosis not present

## 2020-02-13 DIAGNOSIS — N401 Enlarged prostate with lower urinary tract symptoms: Secondary | ICD-10-CM

## 2020-02-13 LAB — BLADDER SCAN AMB NON-IMAGING: Scan Result: 165

## 2020-02-14 ENCOUNTER — Encounter: Payer: Self-pay | Admitting: Urology

## 2020-02-14 NOTE — Progress Notes (Signed)
02/13/2020 10:27 AM   Bradley [name] 1941-03-08 [id number]  Referring provider: Sofie Hartigan, MD [place],  [place] [address]  Chief Complaint  Patient presents with  . 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 male presents for postop follow-up   UroLift 01/13/2020  Seen 7/2 with urinary retention  Declined Foley drainage and opted for self cath which he only had to perform once  Having variable symptoms  States there are periods where he voids with an excellent stream without frequency and urgency however other times complains of decreased stream, frequency, urgency and mild urge incontinence  Denies dysuria or gross hematuria     PMH: Past Medical History:  Diagnosis Date  . Allergy   . Barrett's esophagus   . Blockage of coronary artery of heart ([place])   . BPH (benign prostatic hyperplasia)   . Cataracts, bilateral   . Chronic diastolic CHF (congestive heart failure), NYHA class 2 ([facility]) 12/16/2014  . Colon polyp   . Coronary artery disease   . DDD (degenerative disc disease), lumbosacral 12/05/2017  . Diabetes mellitus without complication ([place])    usually running low. to see endocrinologist 01/2020   . Essential hypertension 06/05/2014  . GERD (gastroesophageal reflux disease)    Barretts esophagus  . GI bleed    a. ~2012 around time of hiatal hernia surgery. 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  . Glaucoma   . Glaucoma   . Hemorrhoids   . History of chicken pox   . History of hiatal hernia 2012  . Hyperlipidemia   . Meningioma ([place])    stable, followed by  yearly MRIs  . Migraines   . Neuromuscular disorder (HCC)    neuropathies of lower extrems  . RBBB    also, AF and bradycardia  . Stomach ulcer     Surgical History: Past Surgical History:  Procedure Laterality Date  . ATHERECTOMY  1992  . CARDIAC CATHETERIZATION N/A 01/07/2015   Procedure: Left Heart Cath;  Surgeon: Corey Skains, MD;  Location: [facility];  Service: Cardiovascular;  Laterality: N/A;  . CARDIAC SURGERY     coronary angioplasty  . CHOLECYSTECTOMY    . COLONOSCOPY  11/03/08, 03/09/14  . CORONARY ANGIOPLASTY    . CORONARY ARTERY BYPASS GRAFT N/A 01/19/2015   Procedure: CORONARY ARTERY BYPASS GRAFTING (CABG), with LIM to LAD, vein to left intermediate, vein to PD, vein to OM, using right greater saphenous vein via endovein harvest.;  Surgeon: Grace Isaac, MD;  Location: [facility];  Service: Open Heart Surgery;  Laterality: N/A;  . CYSTOSCOPY WITH INSERTION OF UROLIFT N/A 01/13/2020   Procedure: CYSTOSCOPY WITH INSERTION OF UROLIFT;  Surgeon: Abbie Sons, MD;  Location: [facility];  Service: Urology;  Laterality: N/A;  . DIAGNOSTIC LAPAROSCOPY     cholecystectomy and hiatal hernia repair  . ENDOVEIN HARVEST OF GREATER SAPHENOUS VEIN Right 01/19/2015   Procedure: ENDOVEIN HARVEST OF GREATER SAPHENOUS VEIN;  Surgeon: Grace Isaac, MD;  Location: [facility];  Service: Open Heart Surgery;  Laterality: Right;  . ESOPHAGOGASTRODUODENOSCOPY  02/19/13  . ESOPHAGOGASTRODUODENOSCOPY (EGD) WITH PROPOFOL N/A 08/09/2015   Procedure: ESOPHAGOGASTRODUODENOSCOPY (EGD) WITH PROPOFOL;  Surgeon: Hulen Luster, MD;  Location: [facility];  Service: Gastroenterology;  Laterality: N/A;  . ESOPHAGOGASTRODUODENOSCOPY (EGD) WITH PROPOFOL N/A 04/13/2017   Procedure: ESOPHAGOGASTRODUODENOSCOPY (EGD) WITH PROPOFOL;  Surgeon: Toledo, Benay Pike, MD;  Location: [facility];  Service: Endoscopy;  Laterality: N/A;  . ESOPHAGUS SURGERY    . EYE SURGERY     cataracts BIL  . HERNIA REPAIR    .  HIATAL HERNIA REPAIR N/A   . POLYPECTOMY    . TEE WITHOUT CARDIOVERSION N/A 01/19/2015   Procedure: TRANSESOPHAGEAL ECHOCARDIOGRAM (TEE);  Surgeon: Grace Isaac, MD;  Location: [facility];  Service: Open Heart Surgery;  Laterality: N/A;    Home Medications:  Allergies as of 02/13/2020      Reactions   Metoprolol Other (See Comments)   Hypotension   Sulfadiazine Other (See Comments)   unknown   Oxycodone Other (See Comments)   Reaction:  Hypotension    Sulfa Antibiotics Rash      Medication List       Accurate as of February 13, 2020 11:59 PM. If you have any questions, ask your nurse or doctor.        acetaminophen 500 MG tablet Commonly known as: TYLENOL Take 500 mg by mouth every 6 (six) hours as needed for mild pain or moderate pain.   aspirin EC 81 MG tablet Take 81 mg by mouth daily.   cetirizine 10 MG tablet Commonly known as: ZYRTEC Take 10 mg by mouth daily.   chlorhexidine 0.12 % solution Commonly known as: PERIDEX Use as directed 15 mLs in the mouth or throat 2 (two) times daily as needed (for gum inflammation).   ciprofloxacin 250 MG tablet Commonly known as: CIPRO Take 1 tablet (250 mg total) by mouth every 12 (twelve) hours.   cyanocobalamin 2000 MCG tablet Take 2,000 mcg by mouth daily.   diphenhydrAMINE 25 mg capsule Commonly known as: BENADRYL Take 25 mg by mouth every 6 (six) hours as needed for itching.   docusate sodium 100 MG capsule Commonly known as: COLACE Take 1 capsule (100 mg total) by mouth every 12 (twelve) hours.   dutasteride 0.5 MG capsule Commonly known as: AVODART Take 1 capsule (0.5 mg total) by mouth daily.   eucerin cream Apply 1 application topically daily as needed for dry skin.   hydrocortisone 2.5 % rectal cream Commonly known as: ANUSOL-HC Place 1 application rectally daily as needed for hemorrhoids.   hydrocortisone 25 MG suppository Commonly known as: ANUSOL-HC Place 1 suppository (25 mg total) rectally 2 (two)  times daily.   hydrocortisone cream 1 % Apply 1 application topically 3 (three) times daily as needed for itching. Apply to external hemorrhoid x 7-10 days, then prn with reoccurrence   latanoprost 0.005 % ophthalmic solution Commonly known as: XALATAN Place 1 drop into both eyes at bedtime.   Omeprazole 20 MG Tbdd Take 20 mg by mouth daily.   omeprazole 20 MG capsule Commonly known as: PRILOSEC Take 20 mg by mouth daily.   pravastatin 20 MG tablet Commonly known as: PRAVACHOL Take 20 mg by mouth daily.   silodosin 8 MG Caps capsule Commonly known as: RAPAFLO Take by mouth.   tamsulosin 0.4 MG Caps capsule Commonly known as: FLOMAX Take 1 capsule (0.4 mg total) by mouth daily.   timolol 0.25 % ophthalmic solution Commonly known as: BETIMOL Place 1 drop into both eyes daily.   triamcinolone cream 0.1 % Commonly known as: KENALOG Apply 1 application topically 2 (two) times daily as needed (Foot).  Uribel 118 MG Caps Take 1 capsule (118 mg total) by mouth 3 (three) times daily as needed (Urinary frequency, urgency, burning).   URO-BLUE PO Take by mouth.   V-R MAGNESIUM 250 MG Tabs Generic drug: Magnesium Take 250 mg by mouth daily.       Allergies:  Allergies  Allergen Reactions  . Metoprolol Other (See Comments)    Hypotension  . Sulfadiazine Other (See Comments)    unknown  . Oxycodone Other (See Comments)    Reaction:  Hypotension   . Sulfa Antibiotics Rash    Family History: Family History  Problem Relation Age of Onset  . Parkinsonism Mother   . Kidney disease Father   . Cancer Father        prostate  . Heart disease Father   . Cancer Paternal Uncle        esophageal  . Cancer Paternal Uncle     Social History:  reports that he has never smoked. He has never used smokeless tobacco. He reports that he does not drink alcohol and does not use drugs.   Physical Exam: There were no vitals taken for this visit.  Constitutional:  Alert and  oriented, No acute distress. HEENT: [place] AT, moist mucus membranes.  Trachea midline, no masses. Cardiovascular: No clubbing, cyanosis, or edema. Respiratory: Normal respiratory effort, no increased work of breathing. Skin: No rashes, bruises or suspicious lesions. Neurologic: Grossly intact, no focal deficits, moving all 4 extremities. Psychiatric: Normal mood and affect.   Assessment & Plan:    1. BPH with obstruction/lower urinary tract symptoms  Status post UroLift with variable symptoms  PVR by bladder scan today 165 mL  Still early in his postoperative course and hopefully symptoms will continue to improve  Follow-up 6 weeks with PVR  Continue tamsulosin for now   Abbie Sons, MD  [facility] [address], [place], [place] [address] [phone]

## 2020-04-08 NOTE — Progress Notes (Signed)
04/09/2020 12:26 PM   Bradley [name] 17-Apr-1941 [id number]  Referring provider: Sofie Hartigan, MD [place],  [name] [id number] Chief Complaint  Patient presents with  . 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 male who returns for a 2 month follow up of BPH with obstruction and lower urinary tract symptoms.    UroLift 01/13/2020  Seen 7/2 with urinary retention  Declined Foley drainage and opted for self cath which he only had to perform once  Having variable symptoms  Stated there were periods where he voids with an excellent stream without frequency and urgency however other times complains of decreased stream, frequency, urgency and mild urge incontinence; nocturia x1-2  Thinks he is still taking dutasteride.    His most bothersome symptoms are weak stream and frequency.   He reports urgency and leakage with a few accidents.   PMH: Past Medical History:  Diagnosis Date  . Allergy   . Barrett's esophagus   . Blockage of coronary artery of heart ([place])   . BPH (benign prostatic hyperplasia)   . Cataracts, bilateral   . Chronic diastolic CHF (congestive heart failure), NYHA class 2 ([place]) 12/16/2014  . Colon polyp   . Coronary artery disease   . DDD (degenerative disc disease), lumbosacral 12/05/2017  . Diabetes mellitus without complication ([place])    usually running low. to see endocrinologist 01/2020   . Essential hypertension 06/05/2014  . GERD (gastroesophageal reflux disease)    Barretts esophagus  . GI bleed    a. ~2012 around time of hiatal hernia surgery.  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  . Glaucoma   . Glaucoma   . Hemorrhoids   . History of chicken pox   . History of hiatal hernia 2012  . Hyperlipidemia   . Meningioma ([place])    stable, followed by yearly MRIs  . Migraines   . Neuromuscular disorder (HCC)    neuropathies of lower extrems  . RBBB    also, AF and bradycardia  . Stomach ulcer     Surgical History: Past Surgical History:  Procedure Laterality Date  . ATHERECTOMY  1992  . CARDIAC CATHETERIZATION N/A 01/07/2015   Procedure: Left Heart Cath;  Surgeon: Corey Skains, MD;  Location: [facility];  Service: Cardiovascular;  Laterality: N/A;  . CARDIAC SURGERY     coronary angioplasty  . CHOLECYSTECTOMY    . COLONOSCOPY  11/03/08, 03/09/14  . CORONARY ANGIOPLASTY    . CORONARY ARTERY BYPASS GRAFT N/A 01/19/2015   Procedure: CORONARY ARTERY BYPASS GRAFTING (CABG), with LIM to LAD, vein to left intermediate, vein to PD, vein to OM, using right greater saphenous vein via endovein harvest.;  Surgeon: Grace Isaac, MD;  Location: [facility];  Service: Open Heart Surgery;  Laterality: N/A;  . CYSTOSCOPY WITH INSERTION OF UROLIFT N/A 01/13/2020   Procedure: CYSTOSCOPY WITH INSERTION OF UROLIFT;  Surgeon: Abbie Sons, MD;  Location: [facility];  Service: Urology;  Laterality: N/A;  . DIAGNOSTIC LAPAROSCOPY     cholecystectomy and hiatal hernia repair  . ENDOVEIN HARVEST OF GREATER SAPHENOUS VEIN Right 01/19/2015   Procedure: ENDOVEIN HARVEST OF GREATER SAPHENOUS VEIN;  Surgeon:  Grace Isaac, MD;  Location: [facility];  Service: Open Heart Surgery;  Laterality: Right;  . ESOPHAGOGASTRODUODENOSCOPY  02/19/13  . ESOPHAGOGASTRODUODENOSCOPY (EGD) WITH PROPOFOL N/A 08/09/2015   Procedure: ESOPHAGOGASTRODUODENOSCOPY (EGD) WITH PROPOFOL;  Surgeon: Hulen Luster, MD;  Location: [facility];  Service: Gastroenterology;  Laterality: N/A;  . ESOPHAGOGASTRODUODENOSCOPY (EGD) WITH PROPOFOL N/A 04/13/2017    Procedure: ESOPHAGOGASTRODUODENOSCOPY (EGD) WITH PROPOFOL;  Surgeon: Toledo, Benay Pike, MD;  Location: [facility];  Service: Endoscopy;  Laterality: N/A;  . ESOPHAGUS SURGERY    . EYE SURGERY     cataracts BIL  . HERNIA REPAIR    . HIATAL HERNIA REPAIR N/A   . POLYPECTOMY    . TEE WITHOUT CARDIOVERSION N/A 01/19/2015   Procedure: TRANSESOPHAGEAL ECHOCARDIOGRAM (TEE);  Surgeon: Grace Isaac, MD;  Location: [facility];  Service: Open Heart Surgery;  Laterality: N/A;    Home Medications:  Allergies as of 04/09/2020      Reactions   Metoprolol Other (See Comments)   Hypotension   Sulfadiazine Other (See Comments)   unknown   Oxycodone Other (See Comments)   Reaction:  Hypotension    Sulfa Antibiotics Rash      Medication List       Accurate as of April 09, 2020 12:26 PM. If you have any questions, ask your nurse or doctor.        STOP taking these medications   ciprofloxacin 250 MG tablet Commonly known as: CIPRO Stopped by: Abbie Sons, MD   diphenhydrAMINE 25 mg capsule Commonly known as: BENADRYL Stopped by: Abbie Sons, MD   hydrocortisone 25 MG suppository Commonly known as: ANUSOL-HC Stopped by: Abbie Sons, MD   silodosin 8 MG Caps capsule Commonly known as: RAPAFLO Stopped by: Abbie Sons, MD   tamsulosin 0.4 MG Caps capsule Commonly known as: FLOMAX Stopped by: Abbie Sons, MD   timolol 0.25 % ophthalmic solution Commonly known as: BETIMOL Stopped by: Abbie Sons, MD   URO-BLUE PO Stopped by: Abbie Sons, MD     TAKE these medications   acetaminophen 500 MG tablet Commonly known as: TYLENOL Take 500 mg by mouth every 6 (six) hours as needed for mild pain or moderate pain.   aspirin EC 81 MG tablet Take 81 mg by mouth daily.   cetirizine 10 MG tablet Commonly known as: ZYRTEC Take 10 mg by mouth daily.   chlorhexidine 0.12 % solution Commonly known as: PERIDEX Use as directed 15 mLs in the mouth or throat 2  (two) times daily as needed (for gum inflammation).   cyanocobalamin 2000 MCG tablet Take 2,000 mcg by mouth daily.   docusate sodium 100 MG capsule Commonly known as: COLACE Take 1 capsule (100 mg total) by mouth every 12 (twelve) hours.   dutasteride 0.5 MG capsule Commonly known as: AVODART Take 1 capsule (0.5 mg total) by mouth daily.   eucerin cream Apply 1 application topically daily as needed for dry skin.   hydrocortisone 2.5 % rectal cream Commonly known as: ANUSOL-HC Place 1 application rectally daily as needed for hemorrhoids.   hydrocortisone cream 1 % Apply 1 application topically 3 (three) times daily as needed for itching. Apply to external hemorrhoid x 7-10 days, then prn with reoccurrence   latanoprost 0.005 % ophthalmic solution Commonly known as: XALATAN Place 1 drop into both eyes at bedtime.   Omeprazole 20 MG Tbdd Take 20 mg by mouth daily.   omeprazole 20 MG capsule Commonly known  as: PRILOSEC Take 20 mg by mouth daily.   pravastatin 20 MG tablet Commonly known as: PRAVACHOL Take 20 mg by mouth daily.   triamcinolone cream 0.1 % Commonly known as: KENALOG Apply 1 application topically 2 (two) times daily as needed (Foot).   Uribel 118 MG Caps Take 1 capsule (118 mg total) by mouth 3 (three) times daily as needed (Urinary frequency, urgency, burning).   V-R MAGNESIUM 250 MG Tabs Generic drug: Magnesium Take 250 mg by mouth daily.       Allergies:  Allergies  Allergen Reactions  . Metoprolol Other (See Comments)    Hypotension  . Sulfadiazine Other (See Comments)    unknown  . Oxycodone Other (See Comments)    Reaction:  Hypotension   . Sulfa Antibiotics Rash    Family History: Family History  Problem Relation Age of Onset  . Parkinsonism Mother   . Kidney disease Father   . Cancer Father        prostate  . Heart disease Father   . Cancer Paternal Uncle        esophageal  . Cancer Paternal Uncle     Social History:   reports that he has never smoked. He has never used smokeless tobacco. He reports that he does not drink alcohol and does not use drugs.   Physical Exam: BP 129/67   Pulse 60   Ht 6\' 2"  (1.88 m)   Wt 219 lb (99.3 kg)   BMI 28.12 kg/m   Constitutional:  Alert and oriented, No acute distress. HEENT: [place] AT, moist mucus membranes.  Trachea midline, no masses. Cardiovascular: No clubbing, cyanosis, or edema. Respiratory: Normal respiratory effort, no increased work of breathing. Skin: No rashes, bruises or suspicious lesions. Neurologic: Grossly intact, no focal deficits, moving all 4 extremities. Psychiatric: Normal mood and affect.  Laboratory Data:  Lab Results  Component Value Date   CREATININE 1.54 (H) 01/11/2020    Pertinent Imaging: Results for orders placed or performed in visit on 04/09/20  BLADDER SCAN AMB NON-IMAGING  Result Value Ref Range   Scan Result 154ml      Assessment & Plan:    1. BPH with obstruction/lower urinary tract symptoms   Status post UroLift with variable symptoms  PVR by bladder scan today 108 mL.   Discontinue dutasteride.   Present symptoms are not bothersome enough that he desires more invasive surgical management, i.e. TURP/PVP  He thinks he did well on tamsulosin in the past and Rx was sent to pharmacy.  Follow-up 3 months with IPSS and bladder scan [facility]   [facility] [address] [place], [place] [address] [phone]  I, Selena Batten, am acting as a scribe for Dr. Nicki Reaper C. Allani Reber,  I have reviewed the above documentation for accuracy and completeness, and I agree with the above.   Abbie Sons, MD

## 2020-04-09 ENCOUNTER — Ambulatory Visit: Payer: Self-pay | Admitting: Urology

## 2020-04-09 ENCOUNTER — Other Ambulatory Visit: Payer: Self-pay

## 2020-04-09 ENCOUNTER — Encounter: Payer: Self-pay | Admitting: Urology

## 2020-04-09 ENCOUNTER — Ambulatory Visit (INDEPENDENT_AMBULATORY_CARE_PROVIDER_SITE_OTHER): Payer: Medicare Other | Admitting: Urology

## 2020-04-09 VITALS — BP 129/67 | HR 60 | Ht 74.0 in | Wt 219.0 lb

## 2020-04-09 DIAGNOSIS — N138 Other obstructive and reflux uropathy: Secondary | ICD-10-CM

## 2020-04-09 DIAGNOSIS — R339 Retention of urine, unspecified: Secondary | ICD-10-CM

## 2020-04-09 DIAGNOSIS — N401 Enlarged prostate with lower urinary tract symptoms: Secondary | ICD-10-CM | POA: Diagnosis not present

## 2020-04-09 LAB — BLADDER SCAN AMB NON-IMAGING

## 2020-04-09 MED ORDER — TAMSULOSIN HCL 0.4 MG PO CAPS
0.4000 mg | ORAL_CAPSULE | Freq: Every day | ORAL | 5 refills | Status: DC
Start: 2020-04-09 — End: 2020-06-18

## 2020-04-15 ENCOUNTER — Ambulatory Visit: Payer: Self-pay | Admitting: Urology

## 2020-06-18 ENCOUNTER — Other Ambulatory Visit: Payer: Self-pay

## 2020-06-18 ENCOUNTER — Encounter: Payer: Self-pay | Admitting: Urology

## 2020-06-18 ENCOUNTER — Ambulatory Visit (INDEPENDENT_AMBULATORY_CARE_PROVIDER_SITE_OTHER): Payer: Medicare Other | Admitting: Urology

## 2020-06-18 VITALS — BP 121/65 | HR 64 | Ht 74.0 in | Wt 220.0 lb

## 2020-06-18 DIAGNOSIS — N401 Enlarged prostate with lower urinary tract symptoms: Secondary | ICD-10-CM | POA: Diagnosis not present

## 2020-06-18 DIAGNOSIS — N138 Other obstructive and reflux uropathy: Secondary | ICD-10-CM | POA: Diagnosis not present

## 2020-06-18 LAB — BLADDER SCAN AMB NON-IMAGING: Scan Result: 119

## 2020-06-18 MED ORDER — TAMSULOSIN HCL 0.4 MG PO CAPS: 0.4000 mg | ORAL_CAPSULE | Freq: Every day | ORAL | 3 refills | Status: AC

## 2020-06-18 NOTE — Progress Notes (Signed)
06/18/2020 9:50 PM   Bradley [name] [birth year] [id number]  Referring provider: Sofie Hartigan, MD [place],  [place] [address]  Chief Complaint  Patient presents with  . Benign Prostatic Hypertrophy    HPI: 79 y.o. male presents for follow-up of BPH.   UroLift 01/13/2020  No significant change in symptoms since last visit 04/09/2020  Most bothersome symptom is urgency with small volume urge incontinence   PMH: Past Medical History:  Diagnosis Date  . Allergy   . Barrett's esophagus   . Blockage of coronary artery of heart ([place])   . BPH (benign prostatic hyperplasia)   . Cataracts, bilateral   . Chronic diastolic CHF (congestive heart failure), NYHA class 2 ([facility]) 12/16/2014  . Colon polyp   . Coronary artery disease   . DDD (degenerative disc disease), lumbosacral 12/05/2017  . Diabetes mellitus without complication ([facility])    usually running low. to see endocrinologist 01/2020   . Essential hypertension 06/05/2014  . GERD (gastroesophageal reflux disease)    Barretts esophagus  . GI bleed    a. ~2012 around time of hiatal hernia surgery. 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  . Glaucoma   . Glaucoma   . Hemorrhoids   . History of chicken pox   . History of hiatal hernia 2012  . Hyperlipidemia   . Meningioma ([facility])    stable, followed by yearly MRIs  . Migraines   . Neuromuscular disorder ([facility])    neuropathies of lower extrems  . RBBB    also, AF and bradycardia  . Stomach ulcer     Surgical History: Past Surgical History:  Procedure Laterality Date  . ATHERECTOMY  1992  . CARDIAC CATHETERIZATION N/A 01/07/2015   Procedure: Left Heart Cath;  Surgeon: Corey Skains, MD;  Location: [facility];  Service: Cardiovascular;  Laterality: N/A;  . CARDIAC SURGERY     coronary angioplasty  . CHOLECYSTECTOMY    . COLONOSCOPY  11/03/08, 03/09/14  . CORONARY ANGIOPLASTY    . CORONARY ARTERY BYPASS GRAFT N/A 01/19/2015    Procedure: CORONARY ARTERY BYPASS GRAFTING (CABG), with LIM to LAD, vein to left intermediate, vein to PD, vein to OM, using right greater saphenous vein via endovein harvest.;  Surgeon: Grace Isaac, MD;  Location: [facility];  Service: Open Heart Surgery;  Laterality: N/A;  . CYSTOSCOPY WITH INSERTION OF UROLIFT N/A 01/13/2020   Procedure: CYSTOSCOPY WITH INSERTION OF UROLIFT;  Surgeon: Abbie Sons, MD;  Location: [facility];  Service: Urology;  Laterality: N/A;  . DIAGNOSTIC LAPAROSCOPY     cholecystectomy and hiatal hernia repair  . ENDOVEIN HARVEST OF GREATER SAPHENOUS VEIN Right 01/19/2015   Procedure: ENDOVEIN HARVEST OF GREATER SAPHENOUS VEIN;  Surgeon: Grace Isaac, MD;  Location: [facility];  Service: Open Heart Surgery;  Laterality: Right;  . ESOPHAGOGASTRODUODENOSCOPY  02/19/13  . ESOPHAGOGASTRODUODENOSCOPY (EGD) WITH PROPOFOL N/A 08/09/2015   Procedure: ESOPHAGOGASTRODUODENOSCOPY (EGD) WITH PROPOFOL;  Surgeon: Hulen Luster, MD;  Location: [facility];  Service: Gastroenterology;  Laterality: N/A;  . ESOPHAGOGASTRODUODENOSCOPY (EGD) WITH PROPOFOL N/A 04/13/2017   Procedure: ESOPHAGOGASTRODUODENOSCOPY (EGD) WITH PROPOFOL;  Surgeon: Toledo, Benay Pike, MD;  Location: [facility]OS[facility];  Service: Endoscopy;  Laterality: N/A;  . ESOPHAGUS SURGERY    . EYE SURGERY     cataracts BIL  . HERNIA REPAIR    . HIATAL HERNIA REPAIR N/A   . POLYPECTOMY    . TEE WITHOUT CARDIOVERSION N/A 01/19/2015  Procedure: TRANSESOPHAGEAL ECHOCARDIOGRAM (TEE);  Surgeon: Grace Isaac, MD;  Location: [facility];  Service: Open Heart Surgery;  Laterality: N/A;    Home Medications:  Allergies as of 06/18/2020      Reactions   Metoprolol Other (See Comments)   Hypotension   Sulfadiazine Other (See Comments)   unknown   Oxycodone Other (See Comments)   Reaction:  Hypotension    Sulfa Antibiotics Rash      Medication List       Accurate as of June 18, 2020 11:59 PM. If you have any questions, ask your nurse  or doctor.        acetaminophen 500 MG tablet Commonly known as: TYLENOL Take 500 mg by mouth every 6 (six) hours as needed for mild pain or moderate pain.   aspirin EC 81 MG tablet Take 81 mg by mouth daily.   cetirizine 10 MG tablet Commonly known as: ZYRTEC Take 10 mg by mouth daily.   chlorhexidine 0.12 % solution Commonly known as: PERIDEX Use as directed 15 mLs in the mouth or throat 2 (two) times daily as needed (for gum inflammation).   cyanocobalamin 2000 MCG tablet Take 2,000 mcg by mouth daily.   docusate sodium 100 MG capsule Commonly known as: COLACE Take 1 capsule (100 mg total) by mouth every 12 (twelve) hours.   eucerin cream Apply 1 application topically daily as needed for dry skin.   hydrocortisone 2.5 % rectal cream Commonly known as: ANUSOL-HC Place 1 application rectally daily as needed for hemorrhoids.   hydrocortisone cream 1 % Apply 1 application topically 3 (three) times daily as needed for itching. Apply to external hemorrhoid x 7-10 days, then prn with reoccurrence   latanoprost 0.005 % ophthalmic solution Commonly known as: XALATAN Place 1 drop into both eyes at bedtime.   Omeprazole 20 MG Tbdd Take 20 mg by mouth daily.   omeprazole 20 MG capsule Commonly known as: PRILOSEC Take 20 mg by mouth daily.   pravastatin 20 MG tablet Commonly known as: PRAVACHOL Take 20 mg by mouth daily.   tamsulosin 0.4 MG Caps capsule Commonly known as: FLOMAX Take 1 capsule (0.4 mg total) by mouth daily.   triamcinolone 0.1 % Commonly known as: KENALOG Apply 1 application topically 2 (two) times daily as needed (Foot).   Uribel 118 MG Caps Take 1 capsule (118 mg total) by mouth 3 (three) times daily as needed (Urinary frequency, urgency, burning).   V-R MAGNESIUM 250 MG Tabs Generic drug: Magnesium Take 250 mg by mouth daily.       Allergies:  Allergies  Allergen Reactions  . Metoprolol Other (See Comments)    Hypotension  .  Sulfadiazine Other (See Comments)    unknown  . Oxycodone Other (See Comments)    Reaction:  Hypotension   . Sulfa Antibiotics Rash    Family History: Family History  Problem Relation Age of Onset  . Parkinsonism Mother   . Kidney disease Father   . Cancer Father        prostate  . Heart disease Father   . Cancer Paternal Uncle        esophageal  . Cancer Paternal Uncle     Social History:  reports that he has never smoked. He has never used smokeless tobacco. He reports that he does not drink alcohol and does not use drugs.   Physical Exam: BP 121/65   Pulse 64   Ht 6\' 2"  (1.88 m)   Wt 220  lb (99.8 kg)   BMI 28.25 kg/m   Constitutional:  Alert and oriented, No acute distress.    Assessment & Plan:    1. BPH with obstruction/lower urinary tract symptoms  Present symptoms not bothersome enough that he desires to pursue additional surgery  Most bothersome symptom is urge with small volume urge incontinence  Trial Myrbetriq 25 mg daily-samples given; call back 1 month regarding efficacy  He is still taking tamsulosin and okay to discontinue and restart if his symptoms worsen  PVR bladder scan stable at 119  Follow-up 6 months or earlier for worsening symptoms    Abbie Sons, MD  [facility] [address], [place], [place] [address] [phone]

## 2020-07-15 ENCOUNTER — Telehealth: Payer: Self-pay | Admitting: Urology

## 2020-07-15 ENCOUNTER — Ambulatory Visit: Payer: Self-pay | Admitting: Urology

## 2020-07-15 MED ORDER — MIRABEGRON ER 25 MG PO TB24
25.0000 mg | ORAL_TABLET | Freq: Every day | ORAL | 6 refills | Status: DC
Start: 2020-07-15 — End: 2020-08-28

## 2020-07-15 NOTE — Telephone Encounter (Signed)
Patient came to the office today for an appointment that he had cancelled back in November.  He is requesting a Rx for Myrbetriq 25mg .  He was given samples at this last visit and they are working well for him.    I advised him that Dr. is not in the office today and offered to reschedule the appointment.  He said he would call back when he got home and could look at his calendar.   I gave him a 1 month supply of Myrbetriq 25mg  samples and told him that I would send the request for the prescription to be sent to [facility] in [place].

## 2020-07-15 NOTE — Telephone Encounter (Signed)
Prescription was sent in to pharmacy.

## 2020-07-20 DIAGNOSIS — N183 Chronic kidney disease, stage 3 unspecified: Secondary | ICD-10-CM

## 2020-07-20 HISTORY — DX: Chronic kidney disease, stage 3 unspecified: N18.30

## 2020-07-21 ENCOUNTER — Ambulatory Visit (INDEPENDENT_AMBULATORY_CARE_PROVIDER_SITE_OTHER): Payer: Medicare Other | Admitting: Urology

## 2020-07-21 ENCOUNTER — Encounter: Payer: Self-pay | Admitting: Urology

## 2020-07-21 ENCOUNTER — Other Ambulatory Visit: Payer: Self-pay

## 2020-07-21 VITALS — BP 125/72 | HR 64 | Ht 74.0 in | Wt 221.0 lb

## 2020-07-21 DIAGNOSIS — R310 Gross hematuria: Secondary | ICD-10-CM | POA: Diagnosis not present

## 2020-07-21 NOTE — Progress Notes (Unsigned)
07/21/2020 10:39 AM   Bradley [name] Jul 24, 1940 [id number]  Referring provider: Marina Goodell, MD [address] [place],  [place] [address]  Chief Complaint  Patient presents with  . Hematuria    HPI: 80 y.o. male presents for an acute visit for gross hematuria.   History of BPH status post UroLift June 2021  Last seen 06/18/2020 with complaints of small volume urge incontinence  Given a trial of Myrbetriq and called 12/20 indicating this medication was helping and he desired an Rx however today he states the Rx cost is high and he does not feel it has improved his symptoms enough that he desires to continue  Onset total gross painless hematuria 07/19/2020  No clots or clot retention urine initially pink to red and states now it is brownish in color  Was concerned about blood loss and was seen by his PCP 07/20/2020  Urinalysis with >50 RBCs/0 WBC; creatinine 1.3; hematocrit 45.7   PMH: Past Medical History:  Diagnosis Date  . Allergy   . Barrett's esophagus   . Blockage of coronary artery of heart ([facility])   . BPH (benign prostatic hyperplasia)   . Cataracts, bilateral   . Chronic diastolic CHF (congestive heart failure), NYHA class 2 ([facility]) 12/16/2014  . Colon polyp   . Coronary artery disease   . DDD (degenerative disc disease), lumbosacral 12/05/2017  . Diabetes mellitus without complication ([facility])    usually running low. to see endocrinologist 01/2020   . Essential hypertension 06/05/2014  . GERD (gastroesophageal reflux disease)    Barretts esophagus  . GI bleed    a. ~2012 around time of hiatal hernia surgery. 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  . Glaucoma   . Glaucoma   . Hemorrhoids   . History of chicken pox   . History of hiatal hernia 2012  . Hyperlipidemia   . Meningioma ([facility])    stable, followed by yearly MRIs  . Migraines   . Neuromuscular disorder ([facility])    neuropathies of lower extrems  . RBBB    also, AF and bradycardia   . Stomach ulcer     Surgical History: Past Surgical History:  Procedure Laterality Date  . ATHERECTOMY  1992  . CARDIAC CATHETERIZATION N/A 01/07/2015   Procedure: Left Heart Cath;  Surgeon: Lamar Blinks, MD;  Location: [facility];  Service: Cardiovascular;  Laterality: N/A;  . CARDIAC SURGERY     coronary angioplasty  . CHOLECYSTECTOMY    . COLONOSCOPY  11/03/08, 03/09/14  . CORONARY ANGIOPLASTY    . CORONARY ARTERY BYPASS GRAFT N/A 01/19/2015   Procedure: CORONARY ARTERY BYPASS GRAFTING (CABG), with LIM to LAD, vein to left intermediate, vein to PD, vein to OM, using right greater saphenous vein via endovein harvest.;  Surgeon: Delight Ovens, MD;  [facility];  Service: Open Heart Surgery;  Laterality: N/A;  . CYSTOSCOPY WITH INSERTION OF UROLIFT N/A 01/13/2020   Procedure: CYSTOSCOPY WITH INSERTION OF UROLIFT;  Surgeon: Riki Altes, MD;  Location: [facility];  Service: Urology;  Laterality: N/A;  . DIAGNOSTIC LAPAROSCOPY     cholecystectomy and hiatal hernia repair  . ENDOVEIN HARVEST OF GREATER SAPHENOUS VEIN Right 01/19/2015   Procedure: ENDOVEIN HARVEST OF GREATER SAPHENOUS VEIN;  Surgeon: Delight Ovens, MD;  [facility];  Service: Open Heart Surgery;  Laterality: Right;  . ESOPHAGOGASTRODUODENOSCOPY  02/19/13  . ESOPHAGOGASTRODUODENOSCOPY (EGD) WITH PROPOFOL N/A 08/09/2015   Procedure: ESOPHAGOGASTRODUODENOSCOPY (EGD) WITH  PROPOFOL;  Surgeon: Hulen Luster, MD;  Location: [facility];  Service: Gastroenterology;  Laterality: N/A;  . ESOPHAGOGASTRODUODENOSCOPY (EGD) WITH PROPOFOL N/A 04/13/2017   Procedure: ESOPHAGOGASTRODUODENOSCOPY (EGD) WITH PROPOFOL;  Surgeon: Toledo, Benay Pike, MD;  Location: [facility];  Service: Endoscopy;  Laterality: N/A;  . ESOPHAGUS SURGERY    . EYE SURGERY     cataracts BIL  . HERNIA REPAIR    . HIATAL HERNIA REPAIR N/A   . POLYPECTOMY    . TEE WITHOUT CARDIOVERSION N/A 01/19/2015   Procedure: TRANSESOPHAGEAL ECHOCARDIOGRAM  (TEE);  Surgeon: Grace Isaac, MD;  Location: [facility];  Service: Open Heart Surgery;  Laterality: N/A;    Home Medications:  Allergies as of 07/21/2020      Reactions   Metoprolol Other (See Comments)   Hypotension   Sulfadiazine Other (See Comments)   unknown   Oxycodone Other (See Comments)   Reaction:  Hypotension    Sulfa Antibiotics Rash      Medication List       Accurate as of July 21, 2020 10:39 AM. If you have any questions, ask your nurse or doctor.        acetaminophen 500 MG tablet Commonly known as: TYLENOL Take 500 mg by mouth every 6 (six) hours as needed for mild pain or moderate pain.   aspirin EC 81 MG tablet Take 81 mg by mouth daily.   cetirizine 10 MG tablet Commonly known as: ZYRTEC Take 10 mg by mouth daily.   chlorhexidine 0.12 % solution Commonly known as: PERIDEX Use as directed 15 mLs in the mouth or throat 2 (two) times daily as needed (for gum inflammation).   cyanocobalamin 2000 MCG tablet Take 2,000 mcg by mouth daily.   docusate sodium 100 MG capsule Commonly known as: COLACE Take 1 capsule (100 mg total) by mouth every 12 (twelve) hours.   eucerin cream Apply 1 application topically daily as needed for dry skin.   hydrocortisone 2.5 % rectal cream Commonly known as: ANUSOL-HC Place 1 application rectally daily as needed for hemorrhoids.   hydrocortisone cream 1 % Apply 1 application topically 3 (three) times daily as needed for itching. Apply to external hemorrhoid x 7-10 days, then prn with reoccurrence   latanoprost 0.005 % ophthalmic solution Commonly known as: XALATAN Place 1 drop into both eyes at bedtime.   Magnesium 250 MG Tabs Take 250 mg by mouth daily.   mirabegron ER 25 MG Tb24 tablet Commonly known as: MYRBETRIQ Take 1 tablet (25 mg total) by mouth daily.   Omeprazole 20 MG Tbdd Take 20 mg by mouth daily.   omeprazole 20 MG capsule Commonly known as: PRILOSEC Take 20 mg by mouth daily.    pravastatin 20 MG tablet Commonly known as: PRAVACHOL Take 20 mg by mouth daily.   tamsulosin 0.4 MG Caps capsule Commonly known as: FLOMAX Take 1 capsule (0.4 mg total) by mouth daily.   triamcinolone 0.1 % Commonly known as: KENALOG Apply 1 application topically 2 (two) times daily as needed (Foot).       Allergies:  Allergies  Allergen Reactions  . Metoprolol Other (See Comments)    Hypotension  . Sulfadiazine Other (See Comments)    unknown  . Oxycodone Other (See Comments)    Reaction:  Hypotension   . Sulfa Antibiotics Rash    Family History: Family History  Problem Relation Age of Onset  . Parkinsonism Mother   . Kidney disease Father   . Cancer Father  prostate  . Heart disease Father   . Cancer Paternal Uncle        esophageal  . Cancer Paternal Uncle     Social History:  reports that he has never smoked. He has never used smokeless tobacco. He reports that he does not drink alcohol and does not use drugs.   Physical Exam: BP 125/72   Pulse 64   Ht 6\' 2"  (1.88 m)   Wt 221 lb (100.2 kg)   BMI 28.37 kg/m   Constitutional:  Alert and oriented, No acute distress. HEENT: [place] AT, moist mucus membranes.  Trachea midline, no masses. Cardiovascular: No clubbing, cyanosis, or edema. Respiratory: Normal respiratory effort, no increased work of breathing. Neurologic: Grossly intact, no focal deficits, moving all 4 extremities. Psychiatric: Normal mood and affect.  Laboratory Data:  Urinalysis Dipstick 3+ blood/protein/leukocytes; nitrite + Microscopy 6-10 WBC/>30 RBC/moderate bacteria   Assessment & Plan:    1. Gross hematuria  UA today nitrite + with pyuria  Urine culture ordered  If culture negative will schedule CTU   Abbie Sons, MD  [facility] [address] [place], [place] [address] [phone]

## 2020-07-22 ENCOUNTER — Encounter: Payer: Self-pay | Admitting: Urology

## 2020-07-22 LAB — URINALYSIS, COMPLETE
Specific Gravity, UA: 1.03 (ref 1.005–1.030)
pH, UA: 6 (ref 5.0–7.5)

## 2020-07-22 LAB — MICROSCOPIC EXAMINATION: RBC, Urine: >30 /hpf — AB (ref 0–2)

## 2020-07-23 LAB — CULTURE, URINE COMPREHENSIVE

## 2020-07-26 ENCOUNTER — Other Ambulatory Visit: Payer: Self-pay | Admitting: Urology

## 2020-07-26 DIAGNOSIS — R310 Gross hematuria: Secondary | ICD-10-CM

## 2020-07-27 ENCOUNTER — Telehealth: Payer: Self-pay | Admitting: Family Medicine

## 2020-07-27 NOTE — Telephone Encounter (Signed)
Patient notified and voiced understanding.

## 2020-07-27 NOTE — Telephone Encounter (Signed)
-----   Message from Abbie Sons, MD sent at 07/26/2020  5:42 PM EST ----- Urine culture was negative for infection.  Recommend scheduling a CT urogram to evaluate for kidney causes of blood in the urine.  Order was entered.

## 2020-07-30 ENCOUNTER — Other Ambulatory Visit: Payer: Self-pay | Admitting: Physical Medicine & Rehabilitation

## 2020-07-30 ENCOUNTER — Other Ambulatory Visit (HOSPITAL_COMMUNITY): Payer: Self-pay | Admitting: Physical Medicine & Rehabilitation

## 2020-07-30 DIAGNOSIS — G8929 Other chronic pain: Secondary | ICD-10-CM

## 2020-08-03 ENCOUNTER — Ambulatory Visit: Admission: RE | Admit: 2020-08-03 | Payer: Medicare Other | Source: Ambulatory Visit

## 2020-08-05 ENCOUNTER — Other Ambulatory Visit: Payer: Self-pay

## 2020-08-05 ENCOUNTER — Ambulatory Visit
Admission: RE | Admit: 2020-08-05 | Discharge: 2020-08-05 | Disposition: A | Discharge status: Home / Self Care | Payer: Medicare Other | Source: Ambulatory Visit | Attending: Urology | Admitting: Urology

## 2020-08-05 DIAGNOSIS — R310 Gross hematuria: Secondary | ICD-10-CM | POA: Insufficient documentation

## 2020-08-05 MED ORDER — IOHEXOL 300 MG/ML  SOLN
125.0000 mL | Freq: Once | INTRAMUSCULAR | Status: AC | PRN
  Administered 2020-08-05: 125 mL via INTRAVENOUS

## 2020-08-06 ENCOUNTER — Other Ambulatory Visit: Payer: Self-pay | Admitting: Internal Medicine

## 2020-08-06 ENCOUNTER — Telehealth: Payer: Self-pay | Admitting: *Deleted

## 2020-08-06 DIAGNOSIS — R1312 Dysphagia, oropharyngeal phase: Secondary | ICD-10-CM

## 2020-08-06 NOTE — Telephone Encounter (Signed)
Notified patient as instructed, patient pleased. Discussed follow-up appointments, patient agrees  

## 2020-08-06 NOTE — Telephone Encounter (Signed)
-----   Message from Abbie Sons, MD sent at 08/06/2020 12:57 PM EST ----- CT urogram showed no significant abnormalities of the kidneys or ureters.  There was a small nonobstructing left renal calculus.  Prostate enlargement was noted

## 2020-08-09 ENCOUNTER — Telehealth: Payer: Self-pay | Admitting: *Deleted

## 2020-08-09 NOTE — Telephone Encounter (Signed)
Pt is asking for an alternative to Myrbetriq, per pt his deductible is $500.00. I advised pt that Dr. Bernardo Heater is out of the office this week, per pt he will wait.

## 2020-08-11 ENCOUNTER — Other Ambulatory Visit: Payer: Self-pay

## 2020-08-11 ENCOUNTER — Ambulatory Visit
Admission: RE | Admit: 2020-08-11 | Discharge: 2020-08-11 | Disposition: A | Discharge status: Home / Self Care | Payer: Medicare Other | Source: Ambulatory Visit | Attending: Physical Medicine & Rehabilitation | Admitting: Physical Medicine & Rehabilitation

## 2020-08-11 DIAGNOSIS — M5442 Lumbago with sciatica, left side: Secondary | ICD-10-CM | POA: Diagnosis present

## 2020-08-11 DIAGNOSIS — G8929 Other chronic pain: Secondary | ICD-10-CM | POA: Insufficient documentation

## 2020-08-11 DIAGNOSIS — M5441 Lumbago with sciatica, right side: Secondary | ICD-10-CM | POA: Diagnosis present

## 2020-08-16 ENCOUNTER — Telehealth: Payer: Self-pay | Admitting: Family Medicine

## 2020-08-16 NOTE — Telephone Encounter (Signed)
Schedule cystoscopy

## 2020-08-16 NOTE — Telephone Encounter (Signed)
Patient called and states he is still having quite a bit of hematuria. He has a CT scan done and it was ok. He also had a urine culture sent. The urine culture came back fine. He is wanting to know what to do now? Please advise

## 2020-08-16 NOTE — Telephone Encounter (Signed)
Patient notified and appointment has been made.  

## 2020-08-20 ENCOUNTER — Ambulatory Visit (INDEPENDENT_AMBULATORY_CARE_PROVIDER_SITE_OTHER): Payer: Medicare Other | Admitting: Urology

## 2020-08-20 ENCOUNTER — Other Ambulatory Visit: Payer: Self-pay

## 2020-08-20 ENCOUNTER — Encounter: Payer: Self-pay | Admitting: Urology

## 2020-08-20 VITALS — BP 142/70 | HR 69 | Ht 72.0 in | Wt 221.0 lb

## 2020-08-20 DIAGNOSIS — R310 Gross hematuria: Secondary | ICD-10-CM | POA: Diagnosis not present

## 2020-08-20 DIAGNOSIS — N401 Enlarged prostate with lower urinary tract symptoms: Secondary | ICD-10-CM

## 2020-08-20 LAB — URINALYSIS, COMPLETE
Bilirubin, UA: NEGATIVE
Glucose, UA: NEGATIVE
Ketones, UA: NEGATIVE
Leukocytes,UA: NEGATIVE
Nitrite, UA: NEGATIVE
Specific Gravity, UA: 1.02 (ref 1.005–1.030)
Urobilinogen, Ur: 0.2 mg/dL (ref 0.2–1.0)
pH, UA: 6.5 (ref 5.0–7.5)

## 2020-08-20 LAB — MICROSCOPIC EXAMINATION
Bacteria, UA: NONE SEEN
RBC, Urine: >30 /hpf — AB (ref 0–2)

## 2020-08-20 MED ORDER — FINASTERIDE 5 MG PO TABS: 5.0000 mg | ORAL_TABLET | Freq: Every day | ORAL | 3 refills | Status: DC

## 2020-08-20 NOTE — Progress Notes (Signed)
   08/20/20  CC:  Chief Complaint  Patient presents with  . Cysto    HPI: Presents with intermittent gross hematuria  Blood pressure (!) 142/70, pulse 69, height 6' (1.829 m), weight 221 lb (100.2 kg). NED. A&Ox3.   No respiratory distress   Abd soft, NT, ND Normal phallus with bilateral descended testicles  Imaging: Bilateral renal cysts, 1 Bosniak 2 left lower pole; 4 mm nonobstructing left renal calculus.  Significant prostate enlargement with calculated volume 160 cc  Cystoscopy Procedure Note  Patient identification was confirmed, informed consent was obtained, and patient was prepped using Betadine solution.  Lidocaine jelly was administered per urethral meatus.     Pre-Procedure: - Inspection reveals a normal caliber ureteral meatus.  Procedure: The flexible cystoscope was introduced without difficulty - No urethral strictures/lesions are present. - Lateral lobe enlargement prostate with prominent hypervascularity/friability - Open bladder neck - Bilateral ureteral orifices identified - Bladder mucosa  reveals no ulcers, tumors, or lesions; old clot present in the bladder - No bladder stones - No trabeculation  Retroflexion shows no intravesical median lobe   Post-Procedure: - Patient tolerated the procedure well  Assessment/ Plan:  No bladder mucosal abnormalities, hematuria most likely secondary to BPH  Significant prostate enlargement  Voiding symptoms have been variable  We have discussed additional outlet surgery however he is undecided  In an effort to decrease his hematuria will start finasteride 5 mg daily  Follow-up 3 months with KUB    Abbie Sons, MD

## 2020-08-22 ENCOUNTER — Other Ambulatory Visit: Payer: Self-pay

## 2020-08-22 ENCOUNTER — Encounter: Payer: Self-pay | Admitting: Emergency Medicine

## 2020-08-22 ENCOUNTER — Emergency Department
Admission: EM | Admit: 2020-08-22 | Discharge: 2020-08-22 | Disposition: A | Discharge status: Home / Self Care | Payer: Medicare Other | Attending: Emergency Medicine | Admitting: Emergency Medicine

## 2020-08-22 DIAGNOSIS — N3090 Cystitis, unspecified without hematuria: Secondary | ICD-10-CM

## 2020-08-22 DIAGNOSIS — I5032 Chronic diastolic (congestive) heart failure: Secondary | ICD-10-CM | POA: Diagnosis not present

## 2020-08-22 DIAGNOSIS — Z8546 Personal history of malignant neoplasm of prostate: Secondary | ICD-10-CM | POA: Insufficient documentation

## 2020-08-22 DIAGNOSIS — Z7982 Long term (current) use of aspirin: Secondary | ICD-10-CM | POA: Diagnosis not present

## 2020-08-22 DIAGNOSIS — E119 Type 2 diabetes mellitus without complications: Secondary | ICD-10-CM | POA: Diagnosis not present

## 2020-08-22 DIAGNOSIS — R339 Retention of urine, unspecified: Secondary | ICD-10-CM | POA: Diagnosis present

## 2020-08-22 DIAGNOSIS — I110 Hypertensive heart disease with heart failure: Secondary | ICD-10-CM | POA: Diagnosis not present

## 2020-08-22 DIAGNOSIS — Z79899 Other long term (current) drug therapy: Secondary | ICD-10-CM | POA: Insufficient documentation

## 2020-08-22 DIAGNOSIS — I2510 Atherosclerotic heart disease of native coronary artery without angina pectoris: Secondary | ICD-10-CM | POA: Diagnosis not present

## 2020-08-22 LAB — URINALYSIS, COMPLETE (UACMP) WITH MICROSCOPIC
RBC / HPF: >50 RBC/hpf — ABNORMAL HIGH (ref 0–5)
Specific Gravity, Urine: 1.02 (ref 1.005–1.030)
Squamous Epithelial / HPF: NONE SEEN (ref 0–5)
WBC, UA: >50 WBC/hpf — ABNORMAL HIGH (ref 0–5)

## 2020-08-22 MED ORDER — CEPHALEXIN 500 MG PO CAPS: 500.0000 mg | ORAL_CAPSULE | Freq: Three times a day (TID) | ORAL | 0 refills | Status: DC

## 2020-08-22 NOTE — ED Notes (Signed)
D/C and new RX discussed with pt, pt verbalized understanding. NAD noted. Pt A&Ox4 on D/C. NAD noted. VSS.

## 2020-08-22 NOTE — ED Notes (Signed)
Pt presents to ED with c/o of "not being able to pee all the way". Pt states he had a cystoscopy on Friday and states he has not been able to urinate fully since. Pt states he has been able to pee but "does not feel like I finished'. Pt denies N/V/D. Pt denies fevers or chills. Pt states bilateral groin pain but no recent flank pain. Pt denies burning on urination. NAD noted. Pt is A&Ox4.

## 2020-08-22 NOTE — ED Triage Notes (Signed)
Pt to ED via POV. Pt states that he had cystoscopy on Friday and has not been able to completely empty his bladder since. Pt states that sometimes he is able to urinate some but does not feel like he is able to empty his bladder. Pt states that he is having "stiffness" in his groin are but states that this was present before the cystoscopy.

## 2020-08-22 NOTE — ED Notes (Signed)
Pt educated that we needed a UA, per MD no foley at this time we will wait for UA

## 2020-08-22 NOTE — ED Provider Notes (Signed)
[facility] Emergency Department Provider Note  ____________________________________________  Time seen: Approximately 10:07 AM  I have reviewed the triage vital signs and the nursing notes.   HISTORY  Chief Complaint Urinary Retention    HPI Bradley [name] is a 80 y.o. male with a history of BPH, CHF, diabetes, obesity, who complains of a feeling of fullness in his bladder, feeling like he is unable to empty his bladder all the way since yesterday.  He had a cystoscopy 2 days ago which was reported as unremarkable.  EMR reviewed, noting cystoscopy report from Dr. Bernardo Heater without abnormal findings.  Patient denies fever chills or pain.  No other acute symptoms.      Past Medical History:  Diagnosis Date  . Allergy   . Barrett's esophagus   . Blockage of coronary artery of heart ([place])   . BPH (benign prostatic hyperplasia)   . Cataracts, bilateral   . Chronic diastolic CHF (congestive heart failure), NYHA class 2 ([place]) 12/16/2014  . Colon polyp   . Coronary artery disease   . DDD (degenerative disc disease), lumbosacral 12/05/2017  . Diabetes mellitus without complication ([place])    usually running low. to see endocrinologist 01/2020   . Essential hypertension 06/05/2014  . GERD (gastroesophageal reflux disease)    Barretts esophagus  . GI bleed    a. ~2012 around time of hiatal hernia surgery. 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  . Glaucoma   . Glaucoma   . Hemorrhoids   . History of chicken pox   . History of hiatal hernia 2012  . Hyperlipidemia   . Meningioma ([place])    stable, followed by yearly MRIs  . Migraines   . Neuromuscular disorder (HCC)    neuropathies of lower extrems  . RBBB    also, AF and bradycardia  . Stomach ulcer      Patient Active Problem List   Diagnosis Date Noted  . Bradycardia 02/13/2019  . Meningioma ([place]) 05/30/2018  . External hemorrhoids 04/25/2018  . Incontinence of feces 04/25/2018   . DDD (degenerative disc disease), cervical 03/07/2018  . Glaucoma (increased eye pressure) 12/19/2017  . DDD (degenerative disc disease), lumbosacral 12/05/2017  . Urinary frequency 06/21/2017  . Urinary urgency 06/21/2017  . Bilateral carotid artery stenosis 04/11/2017  . Atrial fibrillation ([place]) 01/30/2015  . 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 Stable angina ([place]) 01/07/2015  . Chronic diastolic CHF (congestive heart failure), NYHA class 2 ([place]) 12/16/2014  . Arteriosclerosis of coronary artery 12/10/2014  . GERD (gastroesophageal reflux disease) 06/16/2014  . Benign essential HTN 06/05/2014  . Mixed hyperlipidemia 06/05/2014  . Moderate mitral insufficiency 06/05/2014  . Chest pain 01/12/2014  . Angina pectoris ([place]) 10/09/2013  . 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 Esophageal disease 10/09/2013  . Benign prostatic hyperplasia with urinary obstruction 04/01/2012  . 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 ED (erectile dysfunction) of organic origin 04/01/2012  . Elevated prostate specific antigen (PSA) 04/01/2012  . Benign neoplasm of cerebral meninges ([place]) 04/24/2011     Past Surgical History:  Procedure Laterality Date  . ATHERECTOMY  1992  . CARDIAC CATHETERIZATION N/A 01/07/2015   Procedure: Left Heart Cath;  Surgeon: Corey Skains, MD;  Location: [place] [facility];  Service: Cardiovascular;  Laterality: N/A;  . CARDIAC SURGERY     coronary angioplasty  . CHOLECYSTECTOMY    . COLONOSCOPY  11/03/08, 03/09/14  . CORONARY ANGIOPLASTY    . CORONARY  ARTERY BYPASS GRAFT N/A 01/19/2015   Procedure: CORONARY ARTERY BYPASS GRAFTING (CABG), with LIM to LAD, vein to left intermediate, vein to PD, vein to OM, using right greater saphenous vein via endovein harvest.;  Surgeon: Grace Isaac, MD;  Location: [place];  Service: Open Heart Surgery;  Laterality: N/A;  . CYSTOSCOPY WITH INSERTION OF UROLIFT N/A 01/13/2020   Procedure: CYSTOSCOPY WITH INSERTION OF UROLIFT;  Surgeon:  Abbie Sons, MD;  Location: [facility] OR[facility];  Service: Urology;  Laterality: N/A;  . DIAGNOSTIC LAPAROSCOPY     cholecystectomy and hiatal hernia repair  . ENDOVEIN HARVEST OF GREATER SAPHENOUS VEIN Right 01/19/2015   Procedure: ENDOVEIN HARVEST OF GREATER SAPHENOUS VEIN;  Surgeon: Grace Isaac, MD;  Location: [place];  Service: Open Heart Surgery;  Laterality: Right;  . ESOPHAGOGASTRODUODENOSCOPY  02/19/13  . ESOPHAGOGASTRODUODENOSCOPY (EGD) WITH PROPOFOL N/A 08/09/2015   Procedure: ESOPHAGOGASTRODUODENOSCOPY (EGD) WITH PROPOFOL;  Surgeon: Hulen Luster, MD;  Location: [facility]COPY;  Service: Gastroenterology;  Laterality: N/A;  . ESOPHAGOGASTRODUODENOSCOPY (EGD) WITH PROPOFOL N/A 04/13/2017   Procedure: ESOPHAGOGASTRODUODENOSCOPY (EGD) WITH PROPOFOL;  Surgeon: Tol[place], Benay Pike, MD;  Location: [facility] ENDOSCOPY;  Service: Endoscopy;  Laterality: N/A;  . ESOPHAGUS SURGERY    . EYE SURGERY     cataracts BIL  . HERNIA REPAIR    . HIATAL HERNIA REPAIR N/A   . POLYPECTOMY    . TEE WITHOUT CARDIOVERSION N/A 01/19/2015   Procedure: TRANSESOPHAGEAL ECHOCARDIOGRAM (TEE);  Surgeon: Grace Isaac, MD;  Location: [place];  Service: Open Heart Surgery;  Laterality: N/A;     Prior to Admission medications   Medication Sig Start Date End Date Taking? Authorizing Provider  cephALEXin (KEFLEX) 500 MG capsule Take 1 capsule (500 mg total) by mouth 3 (three) times daily. 08/22/20  Yes Carrie Mew, MD  acetaminophen (TYLENOL) 500 MG tablet Take 500 mg by mouth every 6 (six) hours as needed for mild pain or moderate pain.    [provider]  aspirin EC 81 MG tablet Take 81 mg by mouth daily.    [provider]  cetirizine (ZYRTEC) 10 MG tablet Take 10 mg by mouth daily.     [provider]  chlorhexidine (PERIDEX) 0.12 % solution Use as directed 15 mLs in the mouth or throat 2 (two) times daily as needed (for gum inflammation).     [provider]  cyanocobalamin 2000 MCG  tablet Take 2,000 mcg by mouth daily.     [provider]  docusate sodium (COLACE) 100 MG capsule Take 1 capsule (100 mg total) by mouth every 12 (twelve) hours. 01/18/20   Rodriguez-Southworth, Sunday Spillers, PA-C  finasteride (PROSCAR) 5 MG tablet Take 1 tablet (5 mg total) by mouth daily. 08/20/20   Stoioff, Ronda Fairly, MD  hydrocortisone (ANUSOL-HC) 2.5 % rectal cream Place 1 application rectally daily as needed for hemorrhoids.  02/10/19   [provider]  hydrocortisone cream 1 % Apply 1 application topically 3 (three) times daily as needed for itching. Apply to external hemorrhoid x 7-10 days, then prn with reoccurrence 01/18/20   Rodriguez-Southworth, Sunday Spillers, PA-C  latanoprost (XALATAN) 0.005 % ophthalmic solution Place 1 drop into both eyes at bedtime.    [provider]  Magnesium 250 MG TABS Take 250 mg by mouth daily.     [provider]  mirabegron ER (MYRBETRIQ) 25 MG TB24 tablet Take 1 tablet (25 mg total) by mouth daily. 07/15/20   Stoioff, Ronda Fairly, MD  omeprazole (PRILOSEC) 20  MG capsule Take 20 mg by mouth daily. 01/12/20   [provider]  Omeprazole 20 MG TBDD Take 20 mg by mouth daily.  05/01/19   [provider]  pravastatin (PRAVACHOL) 20 MG tablet Take 20 mg by mouth daily.    [provider]  Skin Protectants, Misc. (EUCERIN) cream Apply 1 application topically daily as needed for dry skin.    [provider]  tamsulosin (FLOMAX) 0.4 MG CAPS capsule Take 1 capsule (0.4 mg total) by mouth daily. 06/18/20 12/15/20  Stoioff, Ronda Fairly, MD  triamcinolone cream (KENALOG) 0.1 % Apply 1 application topically 2 (two) times daily as needed ([place]).    [provider]     Allergies Metoprolol, Sulfadiazine, Oxycodone, and Sulfa antibiotics   Family History  Problem Relation Age of Onset  . Parkinsonism Mother   . Kidney disease Father   . Cancer Father        prostate  . Heart disease Father   . Cancer Paternal Uncle         esophageal  . Cancer Paternal Uncle     Social History Social History   Tobacco Use  . Smoking status: Never Smoker  . Smokeless tobacco: Never Used  Vaping Use  . Vaping Use: Never used  Substance Use Topics  . Alcohol use: No    Alcohol/week: 0.0 standard drinks  . Drug use: No    Review of Systems  Constitutional:   No fever or chills.  ENT:   No sore throat. No rhinorrhea. Cardiovascular:   No chest pain or syncope. Respiratory:   No dyspnea or cough. Gastrointestinal:   Negative for abdominal pain, vomiting and diarrhea.  Musculoskeletal:   Negative for focal pain or swelling All other systems reviewed and are negative except as documented above in ROS and HPI.  ____________________________________________   PHYSICAL EXAM:  VITAL SIGNS: ED Triage Vitals  Enc Vitals Group     BP 08/22/20 0820 121/71     Pulse Rate 08/22/20 0820 91     Resp 08/22/20 0820 20     Temp 08/22/20 0820 (!) 97.5 F (36.4 C)     Temp Source 08/22/20 0820 Oral     SpO2 08/22/20 0820 95 %     Weight --      Height --      Head Circumference --      Peak Flow --      Pain Score 08/22/20 0824 5     Pain Loc --      Pain Edu? --      Excl. in [place]? --     Vital signs reviewed, nursing assessments reviewed.   Constitutional:   Alert and oriented. Non-toxic appearance. Eyes:   Conjunctivae are normal. EOMI. PERRL. ENT      Head:   Normocephalic and atraumatic.      Nose:   Wearing a mask.      Mouth/Throat:   Wearing a mask.      Neck:   No meningismus. Full ROM. Hematological/Lymphatic/Immunilogical:   No cervical lymphadenopathy. Cardiovascular:   RRR. Symmetric bilateral radial and DP pulses.  No murmurs. Cap refill less than 2 seconds. Respiratory:   Normal respiratory effort without tachypnea/retractions. Breath sounds are clear and equal bilaterally. No wheezes/rales/rhonchi. Gastrointestinal:   Soft and nontender. Non distended. There is no CVA tenderness.  No  rebound, rigidity, or guarding.  No suprapubic fullness. Musculoskeletal:   Normal range of motion in all extremities. No joint  effusions.  No lower extremity tenderness.  No edema. Neurologic:   Normal speech and language.  Motor grossly intact. No acute focal neurologic deficits are appreciated.  Skin:    Skin is warm, dry and intact. No rash noted.  No petechiae, purpura, or bullae.  ____________________________________________    LABS (pertinent positives/negatives) (all labs ordered are listed, but only abnormal results are displayed) Labs Reviewed  URINALYSIS, COMPLETE (UACMP) WITH MICROSCOPIC - Abnormal; Notable for the following components:      Result Value   Color, Urine BROWN (*)    APPearance CLOUDY (*)    Glucose, UA   (*)    Value: TEST NOT REPORTED DUE TO COLOR INTERFERENCE OF URINE PIGMENT   Hgb urine dipstick   (*)    Value: TEST NOT REPORTED DUE TO COLOR INTERFERENCE OF URINE PIGMENT   Bilirubin Urine   (*)    Value: TEST NOT REPORTED DUE TO COLOR INTERFERENCE OF URINE PIGMENT   Ketones, ur   (*)    Value: TEST NOT REPORTED DUE TO COLOR INTERFERENCE OF URINE PIGMENT   Protein, ur   (*)    Value: TEST NOT REPORTED DUE TO COLOR INTERFERENCE OF URINE PIGMENT   Nitrite   (*)    Value: TEST NOT REPORTED DUE TO COLOR INTERFERENCE OF URINE PIGMENT   Leukocytes,Ua   (*)    Value: TEST NOT REPORTED DUE TO COLOR INTERFERENCE OF URINE PIGMENT   RBC / HPF >50 (*)    WBC, UA >50 (*)    Bacteria, UA MANY (*)    All other components within normal limits  URINE CULTURE   ____________________________________________   EKG    ____________________________________________    RADIOLOGY  No results found.  ____________________________________________   PROCEDURES Procedures  ____________________________________________    CLINICAL IMPRESSION / ASSESSMENT AND PLAN / ED COURSE  Medications ordered in the ED: Medications - No data to display  Pertinent labs  & imaging results that were available during my care of the patient were reviewed by me and considered in my medical decision making (see chart for details).  Bradley [name] was evaluated in Emergency Department on 08/22/2020 for the symptoms described in the history of present illness. He was evaluated in the context of the global COVID-19 pandemic, which necessitated consideration that the patient might be at risk for infection with the SARS-CoV-2 virus that causes COVID-19. Institutional protocols and algorithms that pertain to the evaluation of patients at risk for COVID-19 are in a state of rapid change based on information released by regulatory bodies including the CDC and federal and state organizations. These policies and algorithms were followed during the patient's care in the ED.   Patient comes the ED with urinary hesitancy and leaking.  Vital signs are normal, he is nontoxic and well-appearing.  Exam is benign and reassuring.  Bladder scan was 114 mL.  Will check a UA for signs of infection, bladder scan again for postvoid residual, anticipate patient will be able to follow-up with urology again outpatient.   ----------------------------------------- 11:38 AM on 08/22/2020 -----------------------------------------  UA limited by abnormal color, but suggestive of UTI.  Will treat with Keflex.  Prior urine cultures have shown UTI from pansensitive enteric microbes.     ____________________________________________   FINAL CLINICAL IMPRESSION(S) / ED DIAGNOSES    Final diagnoses:  Cystitis     ED Discharge Orders         Ordered    cephALEXin (KEFLEX) 500 MG capsule  3 times daily        08/22/20 1137          Portions of this note were generated with dragon dictation software. Dictation errors may occur despite best attempts at proofreading.   Carrie Mew, MD 08/22/20 1139

## 2020-08-22 NOTE — Discharge Instructions (Signed)
Your urine test today shows signs of a urinary tract infection.  Take keflex as prescribed and follow up with Urology again to monitor your symptoms.

## 2020-08-23 ENCOUNTER — Ambulatory Visit: Payer: Medicare Other

## 2020-08-24 LAB — URINE CULTURE: Culture: >=100000 — AB

## 2020-08-25 ENCOUNTER — Telehealth: Payer: Self-pay

## 2020-08-25 MED ORDER — CIPROFLOXACIN HCL 500 MG PO TABS: 500.0000 mg | ORAL_TABLET | Freq: Two times a day (BID) | ORAL | 0 refills | Status: DC

## 2020-08-25 NOTE — Progress Notes (Signed)
ED Antimicrobial Stewardship Positive Culture Follow Up   Bradley [name] is an 80 y.o. male who presented to [facility] on 08/22/2020 with a chief complaint of  Chief Complaint  Patient presents with  . Urinary Retention    Recent Results (from the past 720 hour(s))  Microscopic Examination     Status: Abnormal   Collection Time: 08/20/20 11:01 AM   Urine  Result Value Ref Range Status   WBC, UA 0-5 0 - 5 /hpf Final   RBC >30 (A) 0 - 2 /hpf Final   Epithelial Cells (non renal) 0-10 0 - 10 /hpf Final   Casts Present (A) None seen /lpf Final   Cast Type Hyaline casts N/A Final   Crystals Present (A) N/A Final   Crystal Type Amorphous Sediment N/A Final   Bacteria, UA None seen None seen/Few Final  Urine Culture     Status: Abnormal   Collection Time: 08/22/20 11:36 AM   Specimen: Urine, Clean Catch  Result Value Ref Range Status   Specimen Description   Final    URINE, CLEAN CATCH Performed at [facility], [address] [place],  [address]    Special Requests   Final    NONE Performed at [facility], [address], [place],  [address]    Culture >=100,000 COLONIES/mL [facility] MORGANII (A)  Final   Report Status 08/24/2020 FINAL  Final   Organism ID, Bacteria [name] MORGANII (A)  Final      Susceptibility   [name] morganii - MIC*    AMPICILLIN >=32 RESISTANT Resistant     CEFAZOLIN >=64 RESISTANT Resistant     CIPROFLOXACIN <=0.25 SENSITIVE Sensitive     GENTAMICIN <=1 SENSITIVE Sensitive     IMIPENEM 2 SENSITIVE Sensitive     NITROFURANTOIN RESISTANT Resistant     TRIMETH/SULFA <=20 SENSITIVE Sensitive     AMPICILLIN/SULBACTAM >=32 RESISTANT Resistant     PIP/TAZO <=4 SENSITIVE Sensitive     * >=100,000 COLONIES/mL [name] MORGANII    80 yo M presenting with urinary retention and hematuria. Pt recently underwent cystoscopy. UCx growing morganella morganii resistant to cephalexin. Planned to call in ciprofloxacin for pt  - Pt informed me via telephone that urologist, Dr. Bernardo Heater, called in ciprofloxacin earlier today. Pt has already picked up and began taking new prescription.   Benn Moulder, PharmD Pharmacy Resident  08/25/2020 5:26 PM

## 2020-08-25 NOTE — Telephone Encounter (Signed)
Patient called stating that he was seen in the ER post cysto last week. He was dx w/UTI and treated with Keflex. He states he is not feeling any better. Per Dr. Bernardo Heater upon review of urine culture results, keflex will not work to treat infection is resistant. Patient was informed that we will change his abx to Cipro 500mg  BID for 7 days. He is to stop the Keflex and start the Cipro this morning. If his symptoms do not improve by Friday morning or if they worsen and he develops fever, chills nausea or vomiting he should call back or return to the ED. Patient verbalized understanding.

## 2020-08-27 ENCOUNTER — Telehealth: Payer: Self-pay

## 2020-08-27 NOTE — Telephone Encounter (Signed)
That was the only effective oral antibiotic for this bacteria that he is not allergic to.  It may take longer than 48 hours to see improvement.  Call office on Monday if no better.  If he has any problems over the weekend would go to urgent care

## 2020-08-27 NOTE — Telephone Encounter (Signed)
Incoming call from pt on triage line, he states that he does not believe his UTI is getting any better. He has been taking Cipro, however he states that he is still urinating every hour and his urine stream is weak, "just a dribble" please advise.

## 2020-08-28 ENCOUNTER — Inpatient Hospital Stay
Admission: EM | Admit: 2020-08-28 | Discharge: 2020-09-01 | DRG: 392 | Disposition: A | Discharge status: Home / Self Care | Payer: Medicare Other | Attending: Internal Medicine | Admitting: Internal Medicine

## 2020-08-28 ENCOUNTER — Emergency Department: Payer: Medicare Other

## 2020-08-28 ENCOUNTER — Encounter: Payer: Self-pay | Admitting: Emergency Medicine

## 2020-08-28 DIAGNOSIS — Z8249 Family history of ischemic heart disease and other diseases of the circulatory system: Secondary | ICD-10-CM

## 2020-08-28 DIAGNOSIS — I10 Essential (primary) hypertension: Secondary | ICD-10-CM | POA: Diagnosis present

## 2020-08-28 DIAGNOSIS — G8929 Other chronic pain: Secondary | ICD-10-CM | POA: Diagnosis present

## 2020-08-28 DIAGNOSIS — Z882 Allergy status to sulfonamides status: Secondary | ICD-10-CM

## 2020-08-28 DIAGNOSIS — N401 Enlarged prostate with lower urinary tract symptoms: Secondary | ICD-10-CM | POA: Diagnosis present

## 2020-08-28 DIAGNOSIS — Z7982 Long term (current) use of aspirin: Secondary | ICD-10-CM

## 2020-08-28 DIAGNOSIS — A419 Sepsis, unspecified organism: Secondary | ICD-10-CM

## 2020-08-28 DIAGNOSIS — Z87440 Personal history of urinary (tract) infections: Secondary | ICD-10-CM

## 2020-08-28 DIAGNOSIS — I959 Hypotension, unspecified: Secondary | ICD-10-CM | POA: Diagnosis present

## 2020-08-28 DIAGNOSIS — B9689 Other specified bacterial agents as the cause of diseases classified elsewhere: Secondary | ICD-10-CM | POA: Diagnosis present

## 2020-08-28 DIAGNOSIS — Z888 Allergy status to other drugs, medicaments and biological substances status: Secondary | ICD-10-CM

## 2020-08-28 DIAGNOSIS — Z951 Presence of aortocoronary bypass graft: Secondary | ICD-10-CM | POA: Diagnosis not present

## 2020-08-28 DIAGNOSIS — E119 Type 2 diabetes mellitus without complications: Secondary | ICD-10-CM | POA: Diagnosis present

## 2020-08-28 DIAGNOSIS — Z66 Do not resuscitate: Secondary | ICD-10-CM | POA: Diagnosis present

## 2020-08-28 DIAGNOSIS — Z9861 Coronary angioplasty status: Secondary | ICD-10-CM

## 2020-08-28 DIAGNOSIS — R338 Other retention of urine: Secondary | ICD-10-CM | POA: Diagnosis not present

## 2020-08-28 DIAGNOSIS — M5441 Lumbago with sciatica, right side: Secondary | ICD-10-CM | POA: Diagnosis present

## 2020-08-28 DIAGNOSIS — Z8546 Personal history of malignant neoplasm of prostate: Secondary | ICD-10-CM

## 2020-08-28 DIAGNOSIS — N3001 Acute cystitis with hematuria: Secondary | ICD-10-CM | POA: Diagnosis present

## 2020-08-28 DIAGNOSIS — K5720 Diverticulitis of large intestine with perforation and abscess without bleeding: Principal | ICD-10-CM

## 2020-08-28 DIAGNOSIS — M48061 Spinal stenosis, lumbar region without neurogenic claudication: Secondary | ICD-10-CM | POA: Diagnosis present

## 2020-08-28 DIAGNOSIS — Z885 Allergy status to narcotic agent status: Secondary | ICD-10-CM

## 2020-08-28 DIAGNOSIS — Z79899 Other long term (current) drug therapy: Secondary | ICD-10-CM

## 2020-08-28 DIAGNOSIS — Z20822 Contact with and (suspected) exposure to covid-19: Secondary | ICD-10-CM | POA: Diagnosis present

## 2020-08-28 DIAGNOSIS — K219 Gastro-esophageal reflux disease without esophagitis: Secondary | ICD-10-CM | POA: Diagnosis not present

## 2020-08-28 DIAGNOSIS — D075 Carcinoma in situ of prostate: Secondary | ICD-10-CM | POA: Diagnosis present

## 2020-08-28 DIAGNOSIS — R001 Bradycardia, unspecified: Secondary | ICD-10-CM | POA: Diagnosis not present

## 2020-08-28 DIAGNOSIS — I2510 Atherosclerotic heart disease of native coronary artery without angina pectoris: Secondary | ICD-10-CM | POA: Diagnosis present

## 2020-08-28 DIAGNOSIS — K5780 Diverticulitis of intestine, part unspecified, with perforation and abscess without bleeding: Secondary | ICD-10-CM | POA: Diagnosis present

## 2020-08-28 DIAGNOSIS — R188 Other ascites: Secondary | ICD-10-CM | POA: Diagnosis present

## 2020-08-28 DIAGNOSIS — E782 Mixed hyperlipidemia: Secondary | ICD-10-CM | POA: Diagnosis present

## 2020-08-28 LAB — COMPREHENSIVE METABOLIC PANEL
ALT: 28 U/L (ref 0–44)
AST: 30 U/L (ref 15–41)
Albumin: 3.5 g/dL (ref 3.5–5.0)
Alkaline Phosphatase: 72 U/L (ref 38–126)
Anion gap: 12 (ref 5–15)
BUN: 19 mg/dL (ref 8–23)
CO2: 20 mmol/L — ABNORMAL LOW (ref 22–32)
Calcium: 9.6 mg/dL (ref 8.9–10.3)
Chloride: 105 mmol/L (ref 98–111)
Creatinine, Ser: 1.39 mg/dL — ABNORMAL HIGH (ref 0.61–1.24)
GFR, Estimated: 51 mL/min — ABNORMAL LOW (ref >60)
Glucose, Bld: 108 mg/dL — ABNORMAL HIGH (ref 70–99)
Potassium: 4.5 mmol/L (ref 3.5–5.1)
Sodium: 137 mmol/L (ref 135–145)
Total Bilirubin: 0.8 mg/dL (ref 0.3–1.2)
Total Protein: 7.2 g/dL (ref 6.5–8.1)

## 2020-08-28 LAB — URINALYSIS, COMPLETE (UACMP) WITH MICROSCOPIC
Bilirubin Urine: NEGATIVE
Glucose, UA: NEGATIVE mg/dL
Ketones, ur: NEGATIVE mg/dL
Leukocytes,Ua: NEGATIVE
Nitrite: NEGATIVE
Protein, ur: NEGATIVE mg/dL
RBC / HPF: >50 RBC/hpf — ABNORMAL HIGH (ref 0–5)
Specific Gravity, Urine: 1.014 (ref 1.005–1.030)
pH: 6 (ref 5.0–8.0)

## 2020-08-28 LAB — CBC WITH DIFFERENTIAL/PLATELET
Abs Immature Granulocytes: 0.12 10*3/uL — ABNORMAL HIGH (ref 0.00–0.07)
Basophils Absolute: 0 10*3/uL (ref 0.0–0.1)
Basophils Relative: 0 %
Eosinophils Absolute: 0.4 10*3/uL (ref 0.0–0.5)
Eosinophils Relative: 4 %
HCT: 46.5 % (ref 39.0–52.0)
Hemoglobin: 14.5 g/dL (ref 13.0–17.0)
Immature Granulocytes: 1 %
Lymphocytes Relative: 18 %
Lymphs Abs: 1.6 10*3/uL (ref 0.7–4.0)
MCH: 31.9 pg (ref 26.0–34.0)
MCHC: 31.2 g/dL (ref 30.0–36.0)
MCV: 102.4 fL — ABNORMAL HIGH (ref 80.0–100.0)
Monocytes Absolute: 0.4 10*3/uL (ref 0.1–1.0)
Monocytes Relative: 4 %
Neutro Abs: 6.5 10*3/uL (ref 1.7–7.7)
Neutrophils Relative %: 73 %
Platelets: 281 10*3/uL (ref 150–400)
RBC: 4.54 MIL/uL (ref 4.22–5.81)
RDW: 13.2 % (ref 11.5–15.5)
WBC: 9 10*3/uL (ref 4.0–10.5)
nRBC: 0 % (ref 0.0–0.2)

## 2020-08-28 LAB — MAGNESIUM: Magnesium: 2.2 mg/dL (ref 1.7–2.4)

## 2020-08-28 LAB — LACTIC ACID, PLASMA: Lactic Acid, Venous: 1.8 mmol/L (ref 0.5–1.9)

## 2020-08-28 LAB — GLUCOSE, CAPILLARY: Glucose-Capillary: 104 mg/dL — ABNORMAL HIGH (ref 70–99)

## 2020-08-28 MED ORDER — ALBUMIN HUMAN 25 % IV SOLN
25.0000 g | Freq: Once | INTRAVENOUS | Status: AC
  Administered 2020-08-28: 25 g via INTRAVENOUS
  Filled 2020-08-28: qty 100

## 2020-08-28 MED ORDER — HEPARIN SODIUM (PORCINE) 5000 UNIT/ML IJ SOLN
5000.0000 [IU] | Freq: Three times a day (TID) | INTRAMUSCULAR | Status: DC
  Administered 2020-08-28 – 2020-08-30 (×5): 5000 [IU] via SUBCUTANEOUS
  Filled 2020-08-28 (×6): qty 1

## 2020-08-28 MED ORDER — MORPHINE SULFATE (PF) 2 MG/ML IV SOLN: 2.0000 mg | INTRAVENOUS | Status: DC | PRN

## 2020-08-28 MED ORDER — POLYVINYL ALCOHOL 1.4 % OP SOLN
1.0000 [drp] | Freq: Every day | OPHTHALMIC | Status: DC | PRN
  Filled 2020-08-28: qty 15

## 2020-08-28 MED ORDER — KETOTIFEN FUMARATE 0.025 % OP SOLN
1.0000 [drp] | Freq: Two times a day (BID) | OPHTHALMIC | Status: DC
  Administered 2020-08-28 – 2020-08-31 (×5): 1 [drp] via OPHTHALMIC
  Filled 2020-08-28: qty 5

## 2020-08-28 MED ORDER — LATANOPROST 0.005 % OP SOLN
1.0000 [drp] | Freq: Every day | OPHTHALMIC | Status: DC
  Administered 2020-08-29 – 2020-08-31 (×3): 1 [drp] via OPHTHALMIC
  Filled 2020-08-28: qty 2.5

## 2020-08-28 MED ORDER — IOHEXOL 300 MG/ML  SOLN
100.0000 mL | Freq: Once | INTRAMUSCULAR | Status: AC | PRN
  Administered 2020-08-28: 100 mL via INTRAVENOUS

## 2020-08-28 MED ORDER — ACETAMINOPHEN 650 MG RE SUPP: 325.0000 mg | Freq: Four times a day (QID) | RECTAL | Status: DC | PRN

## 2020-08-28 MED ORDER — TAMSULOSIN HCL 0.4 MG PO CAPS
0.4000 mg | ORAL_CAPSULE | Freq: Every day | ORAL | Status: DC
  Administered 2020-08-29 – 2020-09-01 (×4): 0.4 mg via ORAL
  Filled 2020-08-28 (×4): qty 1

## 2020-08-28 MED ORDER — DORZOLAMIDE HCL-TIMOLOL MAL 2-0.5 % OP SOLN
1.0000 [drp] | Freq: Two times a day (BID) | OPHTHALMIC | Status: DC
  Administered 2020-08-28 – 2020-09-01 (×8): 1 [drp] via OPHTHALMIC
  Filled 2020-08-28: qty 10

## 2020-08-28 MED ORDER — MORPHINE SULFATE (PF) 2 MG/ML IV SOLN
2.0000 mg | INTRAVENOUS | Status: DC | PRN
Start: 2020-08-28 — End: 2020-08-28

## 2020-08-28 MED ORDER — SODIUM CHLORIDE 0.9 % IV SOLN: INTRAVENOUS | Status: DC

## 2020-08-28 MED ORDER — ONDANSETRON HCL 4 MG/2ML IJ SOLN
4.0000 mg | Freq: Once | INTRAMUSCULAR | Status: AC
  Administered 2020-08-28: 4 mg via INTRAVENOUS
  Filled 2020-08-28: qty 2

## 2020-08-28 MED ORDER — ONDANSETRON HCL 4 MG/2ML IJ SOLN: 4.0000 mg | Freq: Four times a day (QID) | INTRAMUSCULAR | Status: DC | PRN

## 2020-08-28 MED ORDER — PRAVASTATIN SODIUM 20 MG PO TABS
20.0000 mg | ORAL_TABLET | Freq: Every day | ORAL | Status: DC
  Administered 2020-08-29 – 2020-08-31 (×3): 20 mg via ORAL
  Filled 2020-08-28 (×3): qty 1

## 2020-08-28 MED ORDER — LIDOCAINE HCL URETHRAL/MUCOSAL 2 % EX GEL
1.0000 "application " | Freq: Once | CUTANEOUS | Status: AC
  Administered 2020-08-28: 1 via URETHRAL
  Filled 2020-08-28: qty 10

## 2020-08-28 MED ORDER — ONDANSETRON HCL 4 MG PO TABS: 4.0000 mg | ORAL_TABLET | Freq: Four times a day (QID) | ORAL | Status: DC | PRN

## 2020-08-28 MED ORDER — ACETAMINOPHEN 325 MG PO TABS: 325.0000 mg | ORAL_TABLET | Freq: Four times a day (QID) | ORAL | Status: DC | PRN

## 2020-08-28 MED ORDER — FENTANYL CITRATE (PF) 100 MCG/2ML IJ SOLN
50.0000 ug | Freq: Once | INTRAMUSCULAR | Status: AC
  Administered 2020-08-28: 50 ug via INTRAVENOUS
  Filled 2020-08-28: qty 2

## 2020-08-28 MED ORDER — FINASTERIDE 5 MG PO TABS
5.0000 mg | ORAL_TABLET | Freq: Every day | ORAL | Status: DC
  Administered 2020-08-28: 5 mg via ORAL
  Filled 2020-08-28 (×2): qty 1

## 2020-08-28 MED ORDER — ASPIRIN EC 81 MG PO TBEC
81.0000 mg | DELAYED_RELEASE_TABLET | Freq: Every day | ORAL | Status: DC
Start: 2020-08-29 — End: 2020-09-01
  Administered 2020-08-29 – 2020-09-01 (×4): 81 mg via ORAL
  Filled 2020-08-28 (×4): qty 1

## 2020-08-28 MED ORDER — SODIUM CHLORIDE 0.9 % IV SOLN
INTRAVENOUS | Status: DC | PRN
  Administered 2020-08-28 – 2020-08-30 (×5): 250 mL via INTRAVENOUS

## 2020-08-28 MED ORDER — SODIUM CHLORIDE 0.9 % IV BOLUS
1000.0000 mL | Freq: Once | INTRAVENOUS | Status: AC
  Administered 2020-08-28: 1000 mL via INTRAVENOUS

## 2020-08-28 MED ORDER — MIRABEGRON ER 25 MG PO TB24
25.0000 mg | ORAL_TABLET | Freq: Every day | ORAL | Status: DC
  Administered 2020-08-28 – 2020-09-01 (×5): 25 mg via ORAL
  Filled 2020-08-28 (×5): qty 1

## 2020-08-28 MED ORDER — DUTASTERIDE 0.5 MG PO CAPS
0.5000 mg | ORAL_CAPSULE | ORAL | Status: DC
  Administered 2020-08-29 – 2020-08-31 (×2): 0.5 mg via ORAL
  Filled 2020-08-28 (×2): qty 1

## 2020-08-28 MED ORDER — PIPERACILLIN-TAZOBACTAM 3.375 G IVPB 30 MIN
3.3750 g | Freq: Once | INTRAVENOUS | Status: AC
  Administered 2020-08-28: 3.375 g via INTRAVENOUS
  Filled 2020-08-28: qty 50

## 2020-08-28 MED ORDER — FENTANYL CITRATE (PF) 100 MCG/2ML IJ SOLN: 50.0000 ug | INTRAMUSCULAR | Status: DC | PRN

## 2020-08-28 MED ORDER — PIPERACILLIN-TAZOBACTAM 3.375 G IVPB
3.3750 g | Freq: Three times a day (TID) | INTRAVENOUS | Status: DC
  Administered 2020-08-28 – 2020-09-01 (×12): 3.375 g via INTRAVENOUS
  Filled 2020-08-28 (×12): qty 50

## 2020-08-28 MED ORDER — PIPERACILLIN-TAZOBACTAM 3.375 G IVPB 30 MIN: 3.3750 g | Freq: Four times a day (QID) | INTRAVENOUS | Status: DC

## 2020-08-28 NOTE — ED Notes (Signed)
IV appears infiltrated. Will attempt IV placement for abx administration.

## 2020-08-28 NOTE — ED Triage Notes (Signed)
Pt in from home w/complaints of UTI, has been on Keflex since 2/6. Arrives today, very weak, BP's 70's/40's. A&ox4, HOH. 20G and fluids started in triage

## 2020-08-28 NOTE — ED Notes (Addendum)
Overdue latanoprost not available from pharmacy yet.

## 2020-08-28 NOTE — ED Notes (Signed)
Pt having diarrhea and vomiting. Removed pt clothing, perineal area cleaned. EDP now at bedside.

## 2020-08-28 NOTE — H&P (Addendum)
History and Physical   Bradley [name] HKV:[id number] DOB: [birth year] DOA: 08/28/2020  PCP: Bradley Hartigan, MD  Outpatient Specialists: Dr. John Giovanni, [facility] Patient coming from: Home  I have personally briefly reviewed patient's old medical records in [place].  Chief Concern: Abdominal pain and urinary retention  HPI: Bradley [name] is a 80 y.o. male with medical history significant for history of prostate cancer, BPH with urinary retention, recent urinary tract infection, hyperlipidemia, acid reflux, chronic bilateral low back pain with right-sided sciatica, foraminal stenosis of the lumbar region, presented to the emergency department for chief concerns of abdominal pain and urinary retention.  He reports he has felt weak with periumbilical ache for about 1-2 weeks. He endorses nausea and denies vomiting. He endorses one episode of watery diarrhea last week and had one episode of diarrhea in the ED on day of presentation. He denies chest pain and shortness of breath. On physical exam, he has tenderness of the left lower quadrant with palpation.  Reports he has never felt this way before.  Social history: Bradley [name] lives with his spouse of 88 years. He is retired and formerly worked as an Recruitment consultant. He does not use tobacco products, consume etoh or recreational drug use.   Allergies: he denies life threatening drug allergies  Vaccination: Patient is fully vaccinated including boosted for COVID-19  ROS: Constitutional: no weight change, + fever ENT/Mouth: no sore throat, no rhinorrhea Eyes: no eye pain, no vision changes Cardiovascular: no chest pain, no dyspnea,  no edema, no palpitations Respiratory: no cough, no sputum, no wheezing Gastrointestinal: + nausea, no vomiting, no diarrhea, no constipation Genitourinary: no urinary incontinence, no dysuria, no hematuria. He endorses urinary retention Musculoskeletal: no arthralgias, +  myalgias Skin: no skin lesions, no pruritus, Neuro: + weakness, no loss of consciousness, no syncope Psych: no anxiety, no depression, + decrease appetite Heme/Lymph: no bruising, no bleeding  ED Course: Discussed with ED provider, patient requiring hospitalization for diverticulitis with perforated sigmoid and small pneumoperitoneum and ascites.   ED vitals were afebrile, 97.7 Fahrenheit, respiration rate of 20, pulse was 67 initially and ranging from 67-1 04, blood pressure initially was hypotensive however improved and currently is 102/58 with a MAP of 80.  Patient is saturating at 99% or more on room air and not exhibiting any shortness of breath.  Labs in the ED was remarkable for bicarb of 20, glucose 108, serum creatinine of 1.39, GFR estimated at 51.  Negative for leukocytosis, anemia.  UA performed in the ED was negative for leukocytes and nitrites.  ED provider initially treated patient's abdominal pain with Foley catheter and upon placement of Foley, approximately 600 mL of urine with in the bag.  However this did not relieve his pain the first ED provider ordered a CT abdomen and pelvis which was read as acute perforated sigmoid diverticulitis with small volume pneumo peritoneum and ascites.  No drainable abscess.  Unchanged nonoperative left neck nephrolithiasis.  Severe chronic prostatomegaly.  ED provider consulted general surgery, Dr. Dahlia Byes, who revealed CT abdomen and pelvis and recommended fluid resuscitation, IV antibiotics and keep patient n.p.o.  Dr. Adora Fridge will evaluate the patient.  Assessment/Plan  Active Problems:   Diverticulitis of intestine with perforation   Acute perforated sigmoid diverticulitis with small volume pneumoperitoneum and ascites -General surgery has been consulted -Continue IV Zosyn every 6 hours -Status post normal saline 1 L fluid bolus per ED provider -Normal saline IVF at 150 mL/h for  1 day and reassess volume status tomorrow -Keep n.p.o. per  general surgery recommendation -Pain management: Morphine 2 mg IV every 2 hours as needed for moderate pain and fentanyl 50 mcg IV every 2 hours as needed for severe pain  BPH-resumed home finasteride 5 mg p.o. daily, mirabegron 25 mg p.o. daily, tamsulosin p.o. daily  Hyperlipidemia-pravastatin 20 mg daily  History of CAD status post CABG in 2016 with LIMA to the LAD and saphenous vein graft to obtuse marginal 1, PDA, ramus  Medications: Ondansetron and acetaminophen  Chart reviewed.   Patient had cystoscopy procedure on 08/20/2020 and per Dr. Bernardo Heater, the flexible cystoscope was introduced without difficulty, no urethral strictures/lesions are present, the lateral lobe enlargement prostate with prominent hypervascularity and friability, open bladder neck, bilateral ureteral orifices identified, bladder mucosa reveals no ulcers, tumors, lesions; old clot present in the bladder.  No bladder stones.  No trabeculation.  Retroflexion showed no intravesical median lobe.  Patient had urinary tract infection with [name] Morganii and was prescribed ciprofloxacin per urologist as culture was resistant to ampicillin, Unasyn, cefazolin, nitrofurantoin.  Patient endorses compliance with antibiotics except for day of presentation to the hospital as patient was going to the emergency department  DVT prophylaxis: Heparin Code Status: DNR Diet: N.p.o. except for sips with meds and ice chips Family Communication: Updated spouse at bedside Disposition Plan: Pending clinical course Consults called: General surgery Admission status: Observation to MedSurg  Past Medical History:  Diagnosis Date  . Allergy   . Barrett's esophagus   . Blockage of coronary artery of heart ([place])   . BPH (benign prostatic hyperplasia)   . Cataracts, bilateral   . Chronic diastolic CHF (congestive heart failure), NYHA class 2 ([place]) 12/16/2014  . Colon polyp   . Coronary artery disease   . DDD (degenerative disc disease),  lumbosacral 12/05/2017  . Diabetes mellitus without complication ([place])    usually running low. to see endocrinologist 01/2020   . Essential hypertension 06/05/2014  . GERD (gastroesophageal reflux disease)    Barretts esophagus  . GI bleed    a. ~2012 around time of hiatal hernia surgery. 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  . Glaucoma   . Glaucoma   . Hemorrhoids   . History of chicken pox   . History of hiatal hernia 2012  . Hyperlipidemia   . Meningioma ([place])    stable, followed by yearly MRIs  . Migraines   . Neuromuscular disorder ([facility])    neuropathies of lower extrems  . RBBB    also, AF and bradycardia  . Stomach ulcer    Past Surgical History:  Procedure Laterality Date  . ATHERECTOMY  1992  . CARDIAC CATHETERIZATION N/A 01/07/2015   Procedure: Left Heart Cath;  Surgeon: Corey Skains, MD;  Location: [facility];  Service: Cardiovascular;  Laterality: N/A;  . CARDIAC SURGERY     coronary angioplasty  . CHOLECYSTECTOMY    . COLONOSCOPY  11/03/08, 03/09/14  . CORONARY ANGIOPLASTY    . CORONARY ARTERY BYPASS GRAFT N/A 01/19/2015   Procedure: CORONARY ARTERY BYPASS GRAFTING (CABG), with LIM to LAD, vein to left intermediate, vein to PD, vein to OM, using right greater saphenous vein via endovein harvest.;  Surgeon: Grace Isaac, MD;  Location: [place];  Service: Open Heart Surgery;  Laterality: N/A;  . CYSTOSCOPY WITH INSERTION OF UROLIFT N/A 01/13/2020   Procedure: CYSTOSCOPY WITH INSERTION OF UROLIFT;  Surgeon: Abbie Sons, MD;  Location: [facility];  Service: Urology;  Laterality: N/A;  . DIAGNOSTIC LAPAROSCOPY     cholecystectomy and hiatal hernia repair  . ENDOVEIN HARVEST OF GREATER SAPHENOUS VEIN Right 01/19/2015   Procedure: ENDOVEIN HARVEST OF GREATER SAPHENOUS VEIN;  Surgeon: Grace Isaac, MD;  Location: [place];  Service: Open Heart Surgery;  Laterality: Right;  . ESOPHAGOGASTRODUODENOSCOPY  02/19/13  .  ESOPHAGOGASTRODUODENOSCOPY (EGD) WITH PROPOFOL N/A 08/09/2015   Procedure: ESOPHAGOGASTRODUODENOSCOPY (EGD) WITH PROPOFOL;  Surgeon: Hulen Luster, MD;  Location[facility];  Service: Gastroenterology;  Laterality: N/A;  . ESOPHAGOGASTRODUODENOSCOPY (EGD) WITH PROPOFOL N/A 04/13/2017   Procedure: ESOPHAGOGASTRODUODENOSCOPY (EGD) WITH PROPOFOL;  Surgeon: Toledo, Benay Pike, MD;  Location: [facility];  Service: Endoscopy;  Laterality: N/A;  . ESOPHAGUS SURGERY    . EYE SURGERY     cataracts BIL  . HERNIA REPAIR    . HIATAL HERNIA REPAIR N/A   . POLYPECTOMY    . TEE WITHOUT CARDIOVERSION N/A 01/19/2015   Procedure: TRANSESOPHAGEAL ECHOCARDIOGRAM (TEE);  Surgeon: Grace Isaac, MD;  Location: [place];  Service: Open Heart Surgery;  Laterality: N/A;   Social History:  reports that he has never smoked. He has never used smokeless tobacco. He reports that he does not drink alcohol and does not use drugs.  Allergies  Allergen Reactions  . Metoprolol Other (See Comments)    Hypotension  . Sulfadiazine Other (See Comments)    unknown  . Oxycodone Other (See Comments)    Reaction:  Hypotension   . Sulfa Antibiotics Rash   Family History  Problem Relation Age of Onset  . Parkinsonism Mother   . Kidney disease Father   . Cancer Father        prostate  . Heart disease Father   . Cancer Paternal Uncle        esophageal  . Cancer Paternal Uncle    Family history: Family history reviewed and not pertinent  Prior to Admission medications   Medication Sig Start Date End Date Taking? Authorizing Provider  acetaminophen (TYLENOL) 500 MG tablet Take 500 mg by mouth every 6 (six) hours as needed for mild pain or moderate pain.    [provider]  aspirin EC 81 MG tablet Take 81 mg by mouth daily.    [provider]  cephALEXin (KEFLEX) 500 MG capsule Take 1 capsule (500 mg total) by mouth 3 (three) times daily. 08/22/20   Carrie Mew, MD  cetirizine (ZYRTEC) 10 MG tablet  Take 10 mg by mouth daily.     [provider]  chlorhexidine (PERIDEX) 0.12 % solution Use as directed 15 mLs in the mouth or throat 2 (two) times daily as needed (for gum inflammation).     [provider]  ciprofloxacin (CIPRO) 500 MG tablet Take 1 tablet (500 mg total) by mouth every 12 (twelve) hours. 08/25/20   Stoioff, Ronda Fairly, MD  cyanocobalamin 2000 MCG tablet Take 2,000 mcg by mouth daily.     [provider]  docusate sodium (COLACE) 100 MG capsule Take 1 capsule (100 mg total) by mouth every 12 (twelve) hours. 01/18/20   Rodriguez-Southworth, Sunday Spillers, PA-C  finasteride (PROSCAR) 5 MG tablet Take 1 tablet (5 mg total) by mouth daily. 08/20/20   Stoioff, Ronda Fairly, MD  hydrocortisone (ANUSOL-HC) 2.5 % rectal cream Place 1 application rectally daily as needed for hemorrhoids.  02/10/19   [provider]  hydrocortisone cream 1 % Apply 1 application topically 3 (three) times daily as needed for itching. Apply to  external hemorrhoid x 7-10 days, then prn with reoccurrence 01/18/20   Rodriguez-Southworth, Sunday Spillers, PA-C  latanoprost (XALATAN) 0.005 % ophthalmic solution Place 1 drop into both eyes at bedtime.    [provider]  Magnesium 250 MG TABS Take 250 mg by mouth daily.     [provider]  mirabegron ER (MYRBETRIQ) 25 MG TB24 tablet Take 1 tablet (25 mg total) by mouth daily. 07/15/20   Stoioff, Ronda Fairly, MD  omeprazole (PRILOSEC) 20 MG capsule Take 20 mg by mouth daily. 01/12/20   [provider]  Omeprazole 20 MG TBDD Take 20 mg by mouth daily.  05/01/19   [provider]  pravastatin (PRAVACHOL) 20 MG tablet Take 20 mg by mouth daily.    [provider]  Skin Protectants, Misc. (EUCERIN) cream Apply 1 application topically daily as needed for dry skin.    [provider]  tamsulosin (FLOMAX) 0.4 MG CAPS capsule Take 1 capsule (0.4 mg total) by mouth daily. 06/18/20 12/15/20  Stoioff, Ronda Fairly, MD  triamcinolone  cream (KENALOG) 0.1 % Apply 1 application topically 2 (two) times daily as needed (Foot).    [provider]   Physical Exam: Vitals:   08/28/20 1300 08/28/20 1330 08/28/20 1400 08/28/20 1533  BP: (!) 97/52 101/63 110/60 (!) 102/58  Pulse: 89 90 94 99  Resp: 19 19 (!) 24 20  Temp:      TempSrc:      SpO2: 91% 93% 92% 91%  Weight:      Height:       Constitutional: appears age-appropriate, NAD, calm, comfortable Eyes: PERRL, lids and conjunctivae normal ENMT: Mucous membranes are moist. Posterior pharynx clear of any exudate or lesions. Age-appropriate dentition.  Mild hearing loss Neck: normal, supple, no masses, no thyromegaly Respiratory: clear to auscultation bilaterally, no wheezing, no crackles. Normal respiratory effort. No accessory muscle use.  Cardiovascular: Regular rate and rhythm, no murmurs / rubs / gallops. No extremity edema. 2+ pedal pulses. No carotid bruits.  Abdomen: Left lower quadrant abdominal pain, no masses palpated, no hepatosplenomegaly. Bowel sounds positive.  Musculoskeletal: no clubbing / cyanosis. No joint deformity upper and lower extremities. Good ROM, no contractures, no atrophy. Normal muscle tone.  Skin: no rashes, lesions, ulcers. No induration Neurologic: Sensation intact. Strength 5/5 in all 4.  Psychiatric: Normal judgment and insight. Alert and oriented x 3. Normal mood.   EKG: independently reviewed, showing sinus bradycardia rate of 59, QTc 413  Chest x-ray on Admission: I personally reviewed and I agree with radiologist reading as below.  CT ABDOMEN PELVIS W CONTRAST  Result Date: 08/28/2020 CLINICAL DATA:  Abdominal pain with nausea and diarrhea. EXAM: CT ABDOMEN AND PELVIS WITH CONTRAST TECHNIQUE: Multidetector CT imaging of the abdomen and pelvis was performed using the standard protocol following bolus administration of intravenous contrast. CONTRAST:  124mL OMNIPAQUE IOHEXOL 300 MG/ML  SOLN COMPARISON:  CT abdomen pelvis dated  August 05, 2020. FINDINGS: Lower chest: No acute abnormality.  Chronic cardiomegaly. Hepatobiliary: No focal liver abnormality. Unchanged punctate calcified granuloma in the right liver. Prior cholecystectomy. No biliary dilatation. Unchanged pneumobilia. Pancreas: Unremarkable. No pancreatic ductal dilatation or surrounding inflammatory changes. Spleen: Normal in size without focal abnormality. Adrenals/Urinary Tract: The adrenal glands are unremarkable. Unchanged bilateral renal simple cysts. Unchanged 3 mm calculus in the upper pole and punctate calculus in the lower pole of the left kidney. No hydronephrosis. The bladder is decompressed by a Foley catheter. Stomach/Bowel: Acute diverticulitis of the proximal to mid  sigmoid colon with wall thickening and prominent surrounding inflammatory changes. There is slightly organized fluid with surrounding inflammatory stranding in the mesentery anterior and superior to the sigmoid colon (series 2, image 74). There are a multiple small foci of free air. The remaining colon is decompressed. The cecum is centrally located within the abdomen. Normal appendix. Small hiatal hernia. The stomach is otherwise within normal limits. No obstruction. Vascular/Lymphatic: Aortic atherosclerosis. No enlarged abdominal or pelvic lymph nodes. Reproductive: Chronic severe prostatomegaly. Other: Trace perihepatic, mesenteric, and pelvic ascites. Small volume pneumoperitoneum. Musculoskeletal: No acute or significant osseous findings. IMPRESSION: 1. Acute perforated sigmoid diverticulitis with small volume pneumoperitoneum and ascites. No drainable abscess. 2. Unchanged nonobstructive left nephrolithiasis. 3. Chronic severe prostatomegaly. 4. Aortic Atherosclerosis (ICD10-I70.0). Critical Value/emergent results were called by telephone at the time of interpretation on 08/28/2020 at 3:07 pm to provider [facility] , who verbally acknowledged these results. Electronically Signed   By: Titus Dubin M.D.   On: 08/28/2020 15:07   DG Chest Port 1 View  Result Date: 08/28/2020 CLINICAL DATA:  Possible sepsis. EXAM: PORTABLE CHEST 1 VIEW COMPARISON:  Chest x-ray dated January 14, 2019. FINDINGS: The heart size and mediastinal contours are within normal limits. Prior CABG. Normal pulmonary vascularity. No focal consolidation, pleural effusion, or pneumothorax. No acute osseous abnormality. IMPRESSION: No active disease. Electronically Signed   By: Titus Dubin M.D.   On: 08/28/2020 11:32   Labs on Admission: I have personally reviewed following labs  CBC: Recent Labs  Lab 08/28/20 1020  WBC 9.0  NEUTROABS 6.5  HGB 14.5  HCT 46.5  MCV 102.4*  PLT 681   Basic Metabolic Panel: Recent Labs  Lab 08/28/20 1020  NA 137  K 4.5  CL 105  CO2 20*  GLUCOSE 108*  BUN 19  CREATININE 1.39*  CALCIUM 9.6   GFR: Estimated Creatinine Clearance: 52.4 mL/min (A) (by C-G formula based on SCr of 1.39 mg/dL (H)).  Liver Function Tests: Recent Labs  Lab 08/28/20 1020  AST 30  ALT 28  ALKPHOS 72  BILITOT 0.8  PROT 7.2  ALBUMIN 3.5   Urine analysis:    Component Value Date/Time   COLORURINE YELLOW (A) 08/28/2020 1105   APPEARANCEUR HAZY (A) 08/28/2020 1105   APPEARANCEUR Cloudy (A) 08/20/2020 1101   LABSPEC 1.014 08/28/2020 1105   LABSPEC 1.025 08/25/2011 1430   PHURINE 6.0 08/28/2020 1105   GLUCOSEU NEGATIVE 08/28/2020 1105   GLUCOSEU Negative 08/25/2011 1430   HGBUR SMALL (A) 08/28/2020 1105   BILIRUBINUR NEGATIVE 08/28/2020 1105   BILIRUBINUR Negative 08/20/2020 1101   BILIRUBINUR Negative 08/25/2011 1430   KETONESUR NEGATIVE 08/28/2020 1105   PROTEINUR NEGATIVE 08/28/2020 1105   UROBILINOGEN 0.2 01/15/2015 1041   NITRITE NEGATIVE 08/28/2020 1105   LEUKOCYTESUR NEGATIVE 08/28/2020 1105   LEUKOCYTESUR Negative 08/25/2011 1430   Bhumi Godbey N Raylen Ken D.O. [facility]  If 7PM-7AM, please contact overnight-coverage provider If 7AM-7PM, please contact day coverage  provider [email or web address]  08/28/2020, 4:13 PM

## 2020-08-28 NOTE — ED Notes (Signed)
Unable to get second IV and set of cultures. EDP states 1 set of cultures is okay. EDP discussing plan of care with pt (foley insertion).

## 2020-08-28 NOTE — ED Notes (Signed)
Pt had another diarrheal stool. Peri care performed.

## 2020-08-28 NOTE — ED Notes (Signed)
X-ray at bedside

## 2020-08-28 NOTE — ED Notes (Signed)
450 mL urine revealed by bladder scan.

## 2020-08-28 NOTE — ED Provider Notes (Signed)
[facility] Emergency Department Provider Note ____________________________________________   Event Date/Time   First MD Initiated Contact with Patient 08/28/20 1024     (approximate)  I have reviewed the triage vital signs and the nursing notes.  HISTORY  Chief Complaint Urinary Tract Infection and Hypotension   HPI Bradley [name] is a 80 y.o. malewho presents to the ED for evaluation of possible UTI.   Chart review indicates history of BPH and follows with Dr. Bernardo Heater with urology.  Recent, 2/4, outpatient cystoscopy without signs of bladder pathology to cause hematuria.  Prostate large but noted.  Started on finasteride. 2/6 ED visit where he was diagnosed with acute cystitis and discharged on Keflex. This was switched to ciprofloxacin by his urologist in the past 3-4 days.  Patient reports to the ED with lower abdominal and pelvic pain with associated nausea and stool incontinence started this morning. Reports severe lower abdominal pain and difficulty urinating, only dribbling small amounts of urine. Denies dysuria or hematuria. He reports and his wife confirms that he was incontinent of stool this morning, which is unusual for him. No seizure-like activity, falls, head trauma or syncopal episodes. He denies any chest pain, cough, emesis.  Past Medical History:  Diagnosis Date  . Allergy   . Barrett's esophagus   . Blockage of coronary artery of heart ([place])   . BPH (benign prostatic hyperplasia)   . Cataracts, bilateral   . Chronic diastolic CHF (congestive heart failure), NYHA class 2 ([place]) 12/16/2014  . Colon polyp   . Coronary artery disease   . DDD (degenerative disc disease), lumbosacral 12/05/2017  . Diabetes mellitus without complication ([place])    usually running low. to see endocrinologist 01/2020   . Essential hypertension 06/05/2014  . GERD (gastroesophageal reflux disease)    Barretts esophagus  . GI bleed    a. ~2012 around time of hiatal  hernia surgery. 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  . Glaucoma   . Glaucoma   . Hemorrhoids   . History of chicken pox   . History of hiatal hernia 2012  . Hyperlipidemia   . Meningioma ([place])    stable, followed by yearly MRIs  . Migraines   . Neuromuscular disorder (HCC)    neuropathies of lower extrems  . RBBB    also, AF and bradycardia  . Stomach ulcer     Patient Active Problem List   Diagnosis Date Noted  . Bradycardia 02/13/2019  . Meningioma ([place]) 05/30/2018  . External hemorrhoids 04/25/2018  . Incontinence of feces 04/25/2018  . DDD (degenerative disc disease), cervical 03/07/2018  . Glaucoma (increased eye pressure) 12/19/2017  . DDD (degenerative disc disease), lumbosacral 12/05/2017  . Urinary frequency 06/21/2017  . Urinary urgency 06/21/2017  . Bilateral carotid artery stenosis 04/11/2017  . Atrial fibrillation ([place]) 01/30/2015  . 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 Stable angina ([place]) 01/07/2015  . Chronic diastolic CHF (congestive heart failure), NYHA class 2 ([place]) 12/16/2014  . Arteriosclerosis of coronary artery 12/10/2014  . GERD (gastroesophageal reflux disease) 06/16/2014  . Benign essential HTN 06/05/2014  . Mixed hyperlipidemia 06/05/2014  . Moderate mitral insufficiency 06/05/2014  . Chest pain 01/12/2014  . Angina pectoris ([place]) 10/09/2013  . 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 Esophageal disease 10/09/2013  . Benign prostatic hyperplasia with urinary obstruction 04/01/2012  . 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 ED (erectile dysfunction) of organic origin 04/01/2012  . Elevated prostate specific antigen (  PSA) 04/01/2012  . Benign neoplasm of cerebral meninges ([place]) 04/24/2011    Past Surgical History:  Procedure Laterality Date  . ATHERECTOMY  1992  . CARDIAC CATHETERIZATION N/A 01/07/2015   Procedure: Left Heart Cath;  Surgeon: Corey Skains, MD;  Location: [facility];  Service: Cardiovascular;   Laterality: N/A;  . CARDIAC SURGERY     coronary angioplasty  . CHOLECYSTECTOMY    . COLONOSCOPY  11/03/08, 03/09/14  . CORONARY ANGIOPLASTY    . CORONARY ARTERY BYPASS GRAFT N/A 01/19/2015   Procedure: CORONARY ARTERY BYPASS GRAFTING (CABG), with LIM to LAD, vein to left intermediate, vein to PD, vein to OM, using right greater saphenous vein via endovein harvest.;  Surgeon: Grace Isaac, MD;  Location: [place];  Service: Open Heart Surgery;  Laterality: N/A;  . CYSTOSCOPY WITH INSERTION OF UROLIFT N/A 01/13/2020   Procedure: CYSTOSCOPY WITH INSERTION OF UROLIFT;  Surgeon: Abbie Sons, MD;  Location: [facility];  Service: Urology;  Laterality: N/A;  . DIAGNOSTIC LAPAROSCOPY     cholecystectomy and hiatal hernia repair  . ENDOVEIN HARVEST OF GREATER SAPHENOUS VEIN Right 01/19/2015   Procedure: ENDOVEIN HARVEST OF GREATER SAPHENOUS VEIN;  Surgeon: Grace Isaac, MD;  Location: [place];  Service: Open Heart Surgery;  Laterality: Right;  . ESOPHAGOGASTRODUODENOSCOPY  02/19/13  . ESOPHAGOGASTRODUODENOSCOPY (EGD) WITH PROPOFOL N/A 08/09/2015   Procedure: ESOPHAGOGASTRODUODENOSCOPY (EGD) WITH PROPOFOL;  Surgeon: Hulen Luster, MD;  Location: [facility];  Service: Gastroenterology;  Laterality: N/A;  . ESOPHAGOGASTRODUODENOSCOPY (EGD) WITH PROPOFOL N/A 04/13/2017   Procedure: ESOPHAGOGASTRODUODENOSCOPY (EGD) WITH PROPOFOL;  Surgeon: Toledo, Benay Pike, MD;  Location: [facility];  Service: Endoscopy;  Laterality: N/A;  . ESOPHAGUS SURGERY    . EYE SURGERY     cataracts BIL  . HERNIA REPAIR    . HIATAL HERNIA REPAIR N/A   . POLYPECTOMY    . TEE WITHOUT CARDIOVERSION N/A 01/19/2015   Procedure: TRANSESOPHAGEAL ECHOCARDIOGRAM (TEE);  Surgeon: Grace Isaac, MD;  Location: [place];  Service: Open Heart Surgery;  Laterality: N/A;    Prior to Admission medications   Medication Sig Start Date End Date Taking? Authorizing Provider  acetaminophen (TYLENOL) 500 MG tablet Take 500 mg by mouth every 6  (six) hours as needed for mild pain or moderate pain.    [provider]  aspirin EC 81 MG tablet Take 81 mg by mouth daily.    [provider]  cephALEXin (KEFLEX) 500 MG capsule Take 1 capsule (500 mg total) by mouth 3 (three) times daily. 08/22/20   Carrie Mew, MD  cetirizine (ZYRTEC) 10 MG tablet Take 10 mg by mouth daily.     [provider]  chlorhexidine (PERIDEX) 0.12 % solution Use as directed 15 mLs in the mouth or throat 2 (two) times daily as needed (for gum inflammation).     [provider]  ciprofloxacin (CIPRO) 500 MG tablet Take 1 tablet (500 mg total) by mouth every 12 (twelve) hours. 08/25/20   Stoioff, Ronda Fairly, MD  cyanocobalamin 2000 MCG tablet Take 2,000 mcg by mouth daily.     [provider]  docusate sodium (COLACE) 100 MG capsule Take 1 capsule (100 mg total) by mouth every 12 (twelve) hours. 01/18/20   Rodriguez-Southworth, Sunday Spillers, PA-C  finasteride (PROSCAR) 5 MG tablet Take 1 tablet (5 mg total) by mouth daily. 08/20/20   Stoioff, Ronda Fairly, MD  hydrocortisone (ANUSOL-HC) 2.5 % rectal cream Place 1 application rectally daily as needed  for hemorrhoids.  02/10/19   [provider]  hydrocortisone cream 1 % Apply 1 application topically 3 (three) times daily as needed for itching. Apply to external hemorrhoid x 7-10 days, then prn with reoccurrence 01/18/20   Rodriguez-Southworth, Sunday Spillers, PA-C  latanoprost (XALATAN) 0.005 % ophthalmic solution Place 1 drop into both eyes at bedtime.    [provider]  Magnesium 250 MG TABS Take 250 mg by mouth daily.     [provider]  mirabegron ER (MYRBETRIQ) 25 MG TB24 tablet Take 1 tablet (25 mg total) by mouth daily. 07/15/20   Stoioff, Ronda Fairly, MD  omeprazole (PRILOSEC) 20 MG capsule Take 20 mg by mouth daily. 01/12/20   [provider]  Omeprazole 20 MG TBDD Take 20 mg by mouth daily.  05/01/19   [provider]  pravastatin (PRAVACHOL) 20 MG tablet  Take 20 mg by mouth daily.    [provider]  Skin Protectants, Misc. (EUCERIN) cream Apply 1 application topically daily as needed for dry skin.    [provider]  tamsulosin (FLOMAX) 0.4 MG CAPS capsule Take 1 capsule (0.4 mg total) by mouth daily. 06/18/20 12/15/20  Stoioff, Ronda Fairly, MD  triamcinolone cream (KENALOG) 0.1 % Apply 1 application topically 2 (two) times daily as needed ([place]).    [provider]    Allergies Metoprolol, Sulfadiazine, Oxycodone, and Sulfa antibiotics  Family History  Problem Relation Age of Onset  . Parkinsonism Mother   . Kidney disease Father   . Cancer Father        prostate  . Heart disease Father   . Cancer Paternal Uncle        esophageal  . Cancer Paternal Uncle     Social History Social History   Tobacco Use  . Smoking status: Never Smoker  . Smokeless tobacco: Never Used  Vaping Use  . Vaping Use: Never used  Substance Use Topics  . Alcohol use: No    Alcohol/week: 0.0 standard drinks  . Drug use: No    Review of Systems  Constitutional: No fever/chills Eyes: No visual changes. ENT: No sore throat. Cardiovascular: Denies chest pain. Respiratory: Denies shortness of breath. Gastrointestinal:  no vomiting.  No diarrhea.  No constipation. Positive for abdominal pain, nausea and stool incontinence. Genitourinary: Negative for dysuria. Positive for dribbling urine and poor output Musculoskeletal: Negative for back pain. Skin: Negative for rash. Neurological: Negative for headaches, focal weakness or numbness.  ____________________________________________   PHYSICAL EXAM:  VITAL SIGNS: Vitals:   08/28/20 1330 08/28/20 1400  BP: 101/63 110/60  Pulse: 90 94  Resp: 19 (!) 24  Temp:    SpO2: 93% 92%     Constitutional: Alert and oriented. Appears uncomfortable, but no distress. Eyes: Conjunctivae are normal. PERRL. EOMI. Head: Atraumatic. Nose: No congestion/rhinnorhea. Mouth/Throat: Mucous  membranes are dry.  Oropharynx non-erythematous. Neck: No stridor. No cervical spine tenderness to palpation. Cardiovascular: Tachycardic on arrival rate, regular rhythm. Grossly normal heart sounds.  Good peripheral circulation. Respiratory: Normal respiratory effort.  No retractions. Lungs CTAB. Gastrointestinal: Soft , nondistended. No CVA tenderness. Lower quadrant tenderness to palpation with voluntary guarding. Musculoskeletal: No lower extremity tenderness nor edema.  No joint effusions. No signs of acute trauma. Neurologic:  Normal speech and language. No gross focal neurologic deficits are appreciated. No gait instability noted. Skin:  Skin is warm, dry and intact. No rash noted. Psychiatric: Mood and affect are normal. Speech and behavior are normal.  ____________________________________________   LABS (  all labs ordered are listed, but only abnormal results are displayed)  Labs Reviewed  COMPREHENSIVE METABOLIC PANEL - Abnormal; Notable for the following components:      Result Value   CO2 20 (*)    Glucose, Bld 108 (*)    Creatinine, Ser 1.39 (*)    GFR, Estimated 51 (*)    All other components within normal limits  CBC WITH DIFFERENTIAL/PLATELET - Abnormal; Notable for the following components:   MCV 102.4 (*)    Abs Immature Granulocytes 0.12 (*)    All other components within normal limits  URINALYSIS, COMPLETE (UACMP) WITH MICROSCOPIC - Abnormal; Notable for the following components:   Color, Urine YELLOW (*)    APPearance HAZY (*)    Hgb urine dipstick SMALL (*)    RBC / HPF >50 (*)    Bacteria, UA RARE (*)    All other components within normal limits  CULTURE, BLOOD (ROUTINE X 2)  SARS CORONAVIRUS 2 (TAT 6-24 HRS)  LACTIC ACID, PLASMA  PROTIME-INR   ____________________________________________  12 Lead EKG  Sinus rhythm, rate of 59 bpm. Leftward axis. Right bundle branch block. No evidence of acute  ischemia. ____________________________________________  RADIOLOGY  ED MD interpretation: CT abdomen/pelvis reviewed by me with evidence of perforated sigmoid diverticulitis with small amount of pneumoperitoneum  Official radiology report(s): CT ABDOMEN PELVIS W CONTRAST  Result Date: 08/28/2020 CLINICAL DATA:  Abdominal pain with nausea and diarrhea. EXAM: CT ABDOMEN AND PELVIS WITH CONTRAST TECHNIQUE: Multidetector CT imaging of the abdomen and pelvis was performed using the standard protocol following bolus administration of intravenous contrast. CONTRAST:  171mL OMNIPAQUE IOHEXOL 300 MG/ML  SOLN COMPARISON:  CT abdomen pelvis dated August 05, 2020. FINDINGS: Lower chest: No acute abnormality.  Chronic cardiomegaly. Hepatobiliary: No focal liver abnormality. Unchanged punctate calcified granuloma in the right liver. Prior cholecystectomy. No biliary dilatation. Unchanged pneumobilia. Pancreas: Unremarkable. No pancreatic ductal dilatation or surrounding inflammatory changes. Spleen: Normal in size without focal abnormality. Adrenals/Urinary Tract: The adrenal glands are unremarkable. Unchanged bilateral renal simple cysts. Unchanged 3 mm calculus in the upper pole and punctate calculus in the lower pole of the left kidney. No hydronephrosis. The bladder is decompressed by a Foley catheter. Stomach/Bowel: Acute diverticulitis of the proximal to mid sigmoid colon with wall thickening and prominent surrounding inflammatory changes. There is slightly organized fluid with surrounding inflammatory stranding in the mesentery anterior and superior to the sigmoid colon (series 2, image 74). There are a multiple small foci of free air. The remaining colon is decompressed. The cecum is centrally located within the abdomen. Normal appendix. Small hiatal hernia. The stomach is otherwise within normal limits. No obstruction. Vascular/Lymphatic: Aortic atherosclerosis. No enlarged abdominal or pelvic lymph nodes.  Reproductive: Chronic severe prostatomegaly. Other: Trace perihepatic, mesenteric, and pelvic ascites. Small volume pneumoperitoneum. Musculoskeletal: No acute or significant osseous findings. IMPRESSION: 1. Acute perforated sigmoid diverticulitis with small volume pneumoperitoneum and ascites. No drainable abscess. 2. Unchanged nonobstructive left nephrolithiasis. 3. Chronic severe prostatomegaly. 4. Aortic Atherosclerosis (ICD10-I70.0). Critical Value/emergent results were called by telephone at the time of interpretation on 08/28/2020 at 3:07 pm to provider [facility] , who verbally acknowledged these results. Electronically Signed   By: Titus Dubin M.D.   On: 08/28/2020 15:07   DG Chest Port 1 View  Result Date: 08/28/2020 CLINICAL DATA:  Possible sepsis. EXAM: PORTABLE CHEST 1 VIEW COMPARISON:  Chest x-ray dated January 14, 2019. FINDINGS: The heart size and mediastinal contours are within normal limits. Prior CABG.  Normal pulmonary vascularity. No focal consolidation, pleural effusion, or pneumothorax. No acute osseous abnormality. IMPRESSION: No active disease. Electronically Signed   By: Titus Dubin M.D.   On: 08/28/2020 11:32    ____________________________________________   PROCEDURES and INTERVENTIONS  Procedure(s) performed (including Critical Care):  .1-3 Lead EKG Interpretation Performed by: Vladimir Crofts, MD Authorized by: Vladimir Crofts, MD     Interpretation: normal     ECG rate:  80   ECG rate assessment: normal     Rhythm: sinus rhythm     Ectopy: none     Conduction: normal      Medications  piperacillin-tazobactam (ZOSYN) IVPB 3.375 g (has no administration in time range)  albumin human 25 % solution 25 g (has no administration in time range)  sodium chloride 0.9 % bolus 1,000 mL (has no administration in time range)  ondansetron (ZOFRAN) injection 4 mg (4 mg Intravenous Given 08/28/20 1150)  lidocaine (XYLOCAINE) 2 % jelly 1 application (1 application Urethral  Given 08/28/20 1153)  fentaNYL (SUBLIMAZE) injection 50 mcg (50 mcg Intravenous Given 08/28/20 1151)  iohexol (OMNIPAQUE) 300 MG/ML solution 100 mL (100 mLs Intravenous Contrast Given 08/28/20 1432)    ____________________________________________   MDM / ED COURSE   80 year old male with history of BPH presents to the ED with lower abdominal pain with evidence of acute urinary retention requiring Foley catheter placement as well as diverticulitis with perforation, requiring IV antibiotics and medical admission. Noted to be hypotensive in triage, rectified with IV fluids, and otherwise hemodynamically stable on room air. No further evidence of hypotension. Blood work without evidence of severe sepsis. No leukocytosis or lactic acidosis. Postvoid residual noted to be about 500 cc, so Foley catheter was placed and his pain continued. Due to this, CT abdomen/pelvis obtained and demonstrates evidence of perforated sigmoid diverticulitis. We will escalate antibiotics to Zosyn, I discussed the case with surgery who do not plan to operate at this point. We will admit the patient to hospitalist medicine for further work-up and management.  Clinical Course as of 08/28/20 1532  Sat Aug 28, 2020  1135 Post void residual of about 500 cc of urine.  Discussed with patient and wife my recommendations for indwelling Foley catheter.  They are agreeable. [DS]  6063 KZSWF placed by RN [DS]  0932 [id number].  Patient resting comfortably. [DS]  4270 [id number].  Patient reports of continued lower abdominal and pelvic pain despite draining 500 cc from his bladder.  Reevaluation of his abdomen reveals continued tenderness to suprapubic and bilateral lower quadrants without peritoneal features.  Benign upper abdomen.  Shared decision-making yields plan to CT scan abdomen [DS]  1506 Call from rads, Dr Nelia Shi, perforated sigmoid diverticulitis. [DS]  1761 Discussed the diagnosis of perforated diverticulitis with the patient and  his wife. We discussed IV antibiotics and admission with possible surgical  intervention [DS]  1524 I speak with Dr. Dahlia Byes, who reviews images. He indicates that patient would not require surgery at this time. He requests n.p.o. status and medical admission. [DS]    Clinical Course User Index [DS] Vladimir Crofts, MD    ____________________________________________   FINAL CLINICAL IMPRESSION(S) / ED DIAGNOSES  Final diagnoses:  Diverticulitis of large intestine with perforation without bleeding  Acute urinary retention     ED Discharge Orders    None       Lynasia Meloche   Note:  This document was prepared using Dragon voice recognition software and may include unintentional dictation errors.   Tamala Julian,  Camillia Herter, MD 08/28/20 [phone]

## 2020-08-28 NOTE — Progress Notes (Signed)
80 yo comes w urinary retention , diarrhea and abd pain. Hx of CKD, CHF, CAD s/p CABG, HTN W/U findings c/w diverticulitis, small foci of air, no overt perforation . Nml lactate, nml wbc per Dr. Tamala Julian tender on exam but not peritoneal. Prior surgical hx ERCP, lap chole and lap hiatal hernia repair. I have personally reviewed images.  Recommend fluid resuscitation, iv a/bs and keep npo. Full consult to follow. No alarming signs that require surgical intervention at this time.

## 2020-08-28 NOTE — ED Notes (Signed)
Gave ice water for dry mouth.

## 2020-08-29 ENCOUNTER — Other Ambulatory Visit: Payer: Self-pay

## 2020-08-29 ENCOUNTER — Observation Stay: Payer: Medicare Other

## 2020-08-29 DIAGNOSIS — E119 Type 2 diabetes mellitus without complications: Secondary | ICD-10-CM | POA: Diagnosis present

## 2020-08-29 DIAGNOSIS — R338 Other retention of urine: Secondary | ICD-10-CM | POA: Diagnosis present

## 2020-08-29 DIAGNOSIS — I10 Essential (primary) hypertension: Secondary | ICD-10-CM | POA: Diagnosis present

## 2020-08-29 DIAGNOSIS — I959 Hypotension, unspecified: Secondary | ICD-10-CM | POA: Diagnosis present

## 2020-08-29 DIAGNOSIS — N401 Enlarged prostate with lower urinary tract symptoms: Secondary | ICD-10-CM | POA: Diagnosis present

## 2020-08-29 DIAGNOSIS — Z9861 Coronary angioplasty status: Secondary | ICD-10-CM | POA: Diagnosis not present

## 2020-08-29 DIAGNOSIS — R319 Hematuria, unspecified: Secondary | ICD-10-CM | POA: Diagnosis not present

## 2020-08-29 DIAGNOSIS — Z87440 Personal history of urinary (tract) infections: Secondary | ICD-10-CM | POA: Diagnosis not present

## 2020-08-29 DIAGNOSIS — Z20822 Contact with and (suspected) exposure to covid-19: Secondary | ICD-10-CM | POA: Diagnosis present

## 2020-08-29 DIAGNOSIS — Z79899 Other long term (current) drug therapy: Secondary | ICD-10-CM | POA: Diagnosis not present

## 2020-08-29 DIAGNOSIS — E782 Mixed hyperlipidemia: Secondary | ICD-10-CM | POA: Diagnosis present

## 2020-08-29 DIAGNOSIS — Z8546 Personal history of malignant neoplasm of prostate: Secondary | ICD-10-CM | POA: Diagnosis not present

## 2020-08-29 DIAGNOSIS — G8929 Other chronic pain: Secondary | ICD-10-CM | POA: Diagnosis present

## 2020-08-29 DIAGNOSIS — K219 Gastro-esophageal reflux disease without esophagitis: Secondary | ICD-10-CM | POA: Diagnosis present

## 2020-08-29 DIAGNOSIS — N3001 Acute cystitis with hematuria: Secondary | ICD-10-CM | POA: Diagnosis present

## 2020-08-29 DIAGNOSIS — K5780 Diverticulitis of intestine, part unspecified, with perforation and abscess without bleeding: Secondary | ICD-10-CM | POA: Diagnosis present

## 2020-08-29 DIAGNOSIS — Z885 Allergy status to narcotic agent status: Secondary | ICD-10-CM | POA: Diagnosis not present

## 2020-08-29 DIAGNOSIS — Z66 Do not resuscitate: Secondary | ICD-10-CM | POA: Diagnosis present

## 2020-08-29 DIAGNOSIS — M5441 Lumbago with sciatica, right side: Secondary | ICD-10-CM | POA: Diagnosis present

## 2020-08-29 DIAGNOSIS — M48061 Spinal stenosis, lumbar region without neurogenic claudication: Secondary | ICD-10-CM | POA: Diagnosis present

## 2020-08-29 DIAGNOSIS — Z951 Presence of aortocoronary bypass graft: Secondary | ICD-10-CM | POA: Diagnosis not present

## 2020-08-29 DIAGNOSIS — I2510 Atherosclerotic heart disease of native coronary artery without angina pectoris: Secondary | ICD-10-CM | POA: Diagnosis present

## 2020-08-29 DIAGNOSIS — R188 Other ascites: Secondary | ICD-10-CM | POA: Diagnosis present

## 2020-08-29 DIAGNOSIS — K5720 Diverticulitis of large intestine with perforation and abscess without bleeding: Secondary | ICD-10-CM | POA: Diagnosis present

## 2020-08-29 DIAGNOSIS — R310 Gross hematuria: Secondary | ICD-10-CM | POA: Diagnosis not present

## 2020-08-29 DIAGNOSIS — Z7982 Long term (current) use of aspirin: Secondary | ICD-10-CM | POA: Diagnosis not present

## 2020-08-29 DIAGNOSIS — B9689 Other specified bacterial agents as the cause of diseases classified elsewhere: Secondary | ICD-10-CM | POA: Diagnosis present

## 2020-08-29 LAB — GLUCOSE, CAPILLARY
Glucose-Capillary: 96 mg/dL (ref 70–99)
Glucose-Capillary: 88 mg/dL (ref 70–99)
Glucose-Capillary: 80 mg/dL (ref 70–99)
Glucose-Capillary: 73 mg/dL (ref 70–99)
Glucose-Capillary: 93 mg/dL (ref 70–99)
Glucose-Capillary: 96 mg/dL (ref 70–99)

## 2020-08-29 LAB — RESP PANEL BY RT-PCR (FLU A&B, COVID) ARPGX2
Influenza A by PCR: NEGATIVE
Influenza B by PCR: NEGATIVE
SARS Coronavirus 2 by RT PCR: NEGATIVE

## 2020-08-29 LAB — BASIC METABOLIC PANEL
Anion gap: 7 (ref 5–15)
BUN: 21 mg/dL (ref 8–23)
CO2: 23 mmol/L (ref 22–32)
Calcium: 8.6 mg/dL — ABNORMAL LOW (ref 8.9–10.3)
Chloride: 110 mmol/L (ref 98–111)
Creatinine, Ser: 1.5 mg/dL — ABNORMAL HIGH (ref 0.61–1.24)
GFR, Estimated: 47 mL/min — ABNORMAL LOW (ref >60)
Glucose, Bld: 101 mg/dL — ABNORMAL HIGH (ref 70–99)
Potassium: 3.9 mmol/L (ref 3.5–5.1)
Sodium: 140 mmol/L (ref 135–145)

## 2020-08-29 LAB — CBC WITH DIFFERENTIAL/PLATELET
Abs Immature Granulocytes: 0.1 10*3/uL — ABNORMAL HIGH (ref 0.00–0.07)
Basophils Absolute: 0 10*3/uL (ref 0.0–0.1)
Basophils Relative: 0 %
Eosinophils Absolute: 0 10*3/uL (ref 0.0–0.5)
Eosinophils Relative: 0 %
HCT: 34.7 % — ABNORMAL LOW (ref 39.0–52.0)
Hemoglobin: 11.3 g/dL — ABNORMAL LOW (ref 13.0–17.0)
Immature Granulocytes: 1 %
Lymphocytes Relative: 6 %
Lymphs Abs: 1 10*3/uL (ref 0.7–4.0)
MCH: 32.3 pg (ref 26.0–34.0)
MCHC: 32.6 g/dL (ref 30.0–36.0)
MCV: 99.1 fL (ref 80.0–100.0)
Monocytes Absolute: 0.6 10*3/uL (ref 0.1–1.0)
Monocytes Relative: 3 %
Neutro Abs: 15.1 10*3/uL — ABNORMAL HIGH (ref 1.7–7.7)
Neutrophils Relative %: 90 %
Platelets: 235 10*3/uL (ref 150–400)
RBC: 3.5 MIL/uL — ABNORMAL LOW (ref 4.22–5.81)
RDW: 13.4 % (ref 11.5–15.5)
WBC: 16.8 10*3/uL — ABNORMAL HIGH (ref 4.0–10.5)
nRBC: 0 % (ref 0.0–0.2)

## 2020-08-29 LAB — CBC
HCT: 33.9 % — ABNORMAL LOW (ref 39.0–52.0)
Hemoglobin: 11.2 g/dL — ABNORMAL LOW (ref 13.0–17.0)
MCH: 32.6 pg (ref 26.0–34.0)
MCHC: 33 g/dL (ref 30.0–36.0)
MCV: 98.5 fL (ref 80.0–100.0)
Platelets: 237 10*3/uL (ref 150–400)
RBC: 3.44 MIL/uL — ABNORMAL LOW (ref 4.22–5.81)
RDW: 13.3 % (ref 11.5–15.5)
WBC: 16.8 10*3/uL — ABNORMAL HIGH (ref 4.0–10.5)
nRBC: 0 % (ref 0.0–0.2)

## 2020-08-29 LAB — PROTIME-INR
INR: 1.4 — ABNORMAL HIGH (ref 0.8–1.2)
Prothrombin Time: 16.4 seconds — ABNORMAL HIGH (ref 11.4–15.2)

## 2020-08-29 MED ORDER — MORPHINE SULFATE (PF) 4 MG/ML IV SOLN: 4.0000 mg | INTRAVENOUS | Status: DC | PRN

## 2020-08-29 MED ORDER — ALBUMIN HUMAN 25 % IV SOLN
25.0000 g | Freq: Once | INTRAVENOUS | Status: AC
  Administered 2020-08-29: 25 g via INTRAVENOUS
  Filled 2020-08-29: qty 100

## 2020-08-29 MED ORDER — KCL IN DEXTROSE-NACL 20-5-0.9 MEQ/L-%-% IV SOLN
INTRAVENOUS | Status: DC
  Filled 2020-08-29 (×8): qty 1000

## 2020-08-29 MED ORDER — LIDOCAINE 5 % EX PTCH
1.0000 | MEDICATED_PATCH | CUTANEOUS | Status: DC
  Administered 2020-08-29 – 2020-09-01 (×4): 1 via TRANSDERMAL
  Filled 2020-08-29 (×4): qty 1

## 2020-08-29 MED ORDER — CHLORHEXIDINE GLUCONATE CLOTH 2 % EX PADS
6.0000 | MEDICATED_PAD | Freq: Every day | CUTANEOUS | Status: DC
  Administered 2020-08-29 – 2020-09-01 (×4): 6 via TOPICAL

## 2020-08-29 MED ORDER — DEXTROSE 50 % IV SOLN
25.0000 mL | Freq: Once | INTRAVENOUS | Status: AC
  Administered 2020-08-29: 25 mL via INTRAVENOUS
  Filled 2020-08-29: qty 50

## 2020-08-29 MED ORDER — MORPHINE SULFATE (PF) 2 MG/ML IV SOLN
2.0000 mg | INTRAVENOUS | Status: DC | PRN
  Administered 2020-08-29 – 2020-08-31 (×5): 2 mg via INTRAVENOUS
  Filled 2020-08-29 (×5): qty 1

## 2020-08-29 MED ORDER — ACETAMINOPHEN 325 MG PO TABS: 650.0000 mg | ORAL_TABLET | ORAL | Status: DC | PRN

## 2020-08-29 NOTE — Plan of Care (Signed)
  Problem: Activity: Goal: Risk for activity intolerance will decrease 08/29/2020 0536 by Isaiah Serge, RN Outcome: Progressing 08/29/2020 0536 by Isaiah Serge, RN Outcome: Progressing   Problem: Elimination: Goal: Will not experience complications related to urinary retention 08/29/2020 0536 by Isaiah Serge, RN Outcome: Progressing 08/29/2020 0536 by Isaiah Serge, RN Outcome: Progressing   Problem: Pain Managment: Goal: General experience of comfort will improve 08/29/2020 0536 by Isaiah Serge, RN Outcome: Progressing 08/29/2020 0536 by Isaiah Serge, RN Outcome: Progressing   Problem: Safety: Goal: Ability to remain free from injury will improve 08/29/2020 0536 by Isaiah Serge, RN Outcome: Progressing 08/29/2020 0536 by Isaiah Serge, RN Outcome: Progressing

## 2020-08-29 NOTE — Progress Notes (Signed)
PROGRESS NOTE    Bradley [name]  GOT:[id number] DOB: 1940-09-07 DOA: 08/28/2020 PCP: Sofie Hartigan, MD  Brief Narrative:  80 y.o. male with medical history significant for history of prostate cancer, BPH with urinary retention, recent urinary tract infection, hyperlipidemia, acid reflux, chronic bilateral low back pain with right-sided sciatica, foraminal stenosis of the lumbar region, presented to the emergency department for chief concerns of abdominal pain and urinary retention.  He reports he has felt weak with periumbilical ache for about 1-2 weeks. He endorses nausea and denies vomiting. He endorses one episode of watery diarrhea last week and had one episode of diarrhea in the ED on day of presentation. He denies chest pain and shortness of breath. On physical exam, he has tenderness of the left lower quadrant with palpation.  CT imaging survey revealed diverticulitis with possible small contained perforation.  General surgery was involved with recommendations for n.p.o. status and serial abdominal exams.   Possible surgical intervention during this admission   Assessment & Plan:   Active Problems:   Benign essential HTN   CA in situ prostate   S/P CABG x 4   GERD (gastroesophageal reflux disease)   Mixed hyperlipidemia   Bradycardia   Diverticulitis of intestine with perforation   Perforated diverticulum  Acute perforated sigmoid diverticulitis Patient endorses weakness and pain he has never felt before CT imaging survey revealed the above General surgery involved Plan: Continue IV Zosyn Continue fluid resuscitation Keep n.p.o. for now Pain control as needed Antiemetics as needed Follow general surgery recommendations  Recent bladder instrumentation [name] UTI Patient was previously on ciprofloxacin for UTI associated with the recent cystoscopy No acute issues We will maintain Foley catheter in place IV Zosyn as  above  Hyperlipidemia Statin  BPH Finasteride Mirabegron Tamsulosin  History of coronary disease status post CABG 2016 LIMA to LAD, saphenous to OM 1 No acute issues Outpatient follow-up    DVT prophylaxis: SQ heparin Code Status: DNR Family Communication: None today.  Offered to call but patient declined Disposition Plan: Status is: Inpatient  Remains inpatient appropriate because:Inpatient level of care appropriate due to severity of illness   Dispo: The patient is from: Home              Anticipated d/c is to: Home              Anticipated d/c date is: 3 days              Patient currently is not medically stable to d/c.   Difficult to place patient No  Acute diverticulitis with small perforation.  On IV antibiotics.  General surgery involved.     Level of care: Med-Surg  Consultants:   General surgery  Procedures:   None  Antimicrobials:   Zosyn   Subjective: Patient seen and examined.  Reports marked improvement in abdominal pain.  Rates pain 2/10.  No other issues.  Objective: Vitals:   08/28/20 2250 08/29/20 0416 08/29/20 0858 08/29/20 1120  BP: 109/75 131/63 (!) 103/45 (!) 105/46  Pulse: 65 70 65 62  Resp:  16 18 18   Temp: 98.9 F (37.2 C) 98.1 F (36.7 C) 97.6 F (36.4 C) 97.8 F (36.6 C)  TempSrc: Oral   Oral  SpO2: 95% 95% 92% 98%  Weight:      Height:        Intake/Output Summary (Last 24 hours) at 08/29/2020 1204 Last data filed at 08/29/2020 0537 Gross per 24 hour  Intake 1911.83  ml  Output 2025 ml  Net -113.17 ml   Filed Weights   08/28/20 0959  Weight: 102.1 kg    Examination:  General exam: Appears calm and comfortable  Respiratory system: Clear to auscultation. Respiratory effort normal. Cardiovascular system: S1 & S2 heard, RRR. No JVD, murmurs, rubs, gallops or clicks. No pedal edema. Gastrointestinal system: Obese, mild tender to palpation lower abdomen.  Normal bowel sounds  Central nervous system: Alert and  oriented. No focal neurological deficits. Extremities: Symmetric 5 x 5 power. Skin: No rashes, lesions or ulcers Psychiatry: Judgement and insight appear normal. Mood & affect appropriate.     Data Reviewed: I have personally reviewed following labs and imaging studies  CBC: Recent Labs  Lab 08/28/20 1020 08/29/20 0457  WBC 9.0 16.8*  16.8*  NEUTROABS 6.5 15.1*  HGB 14.5 11.3*  11.2*  HCT 46.5 34.7*  33.9*  MCV 102.4* 99.1  98.5  PLT 281 235  595   Basic Metabolic Panel: Recent Labs  Lab 08/28/20 1020 08/29/20 0457  NA 137 140  K 4.5 3.9  CL 105 110  CO2 20* 23  GLUCOSE 108* 101*  BUN 19 21  CREATININE 1.39* 1.50*  CALCIUM 9.6 8.6*  MG 2.2  --    GFR: Estimated Creatinine Clearance: 48.6 mL/min (A) (by C-G formula based on SCr of 1.5 mg/dL (H)). Liver Function Tests: Recent Labs  Lab 08/28/20 1020  AST 30  ALT 28  ALKPHOS 72  BILITOT 0.8  PROT 7.2  ALBUMIN 3.5   No results for input(s): LIPASE, AMYLASE in the last 168 hours. No results for input(s): AMMONIA in the last 168 hours. Coagulation Profile: Recent Labs  Lab 08/29/20 0457  INR 1.4*   Cardiac Enzymes: No results for input(s): CKTOTAL, CKMB, CKMBINDEX, TROPONINI in the last 168 hours. BNP (last 3 results) No results for input(s): PROBNP in the last 8760 hours. HbA1C: No results for input(s): HGBA1C in the last 72 hours. CBG: Recent Labs  Lab 08/28/20 2323 08/29/20 0409 08/29/20 0816  GLUCAP 104* 96 88   Lipid Profile: No results for input(s): CHOL, HDL, LDLCALC, TRIG, CHOLHDL, LDLDIRECT in the last 72 hours. Thyroid Function Tests: No results for input(s): TSH, T4TOTAL, FREET4, T3FREE, THYROIDAB in the last 72 hours. Anemia Panel: No results for input(s): VITAMINB12, FOLATE, FERRITIN, TIBC, IRON, RETICCTPCT in the last 72 hours. Sepsis Labs: Recent Labs  Lab 08/28/20 1020  LATICACIDVEN 1.8    Recent Results (from the past 240 hour(s))  Microscopic Examination     Status:  Abnormal   Collection Time: 08/20/20 11:01 AM   Urine  Result Value Ref Range Status   WBC, UA 0-5 0 - 5 /hpf Final   RBC >30 (A) 0 - 2 /hpf Final   Epithelial Cells (non renal) 0-10 0 - 10 /hpf Final   Casts Present (A) None seen /lpf Final   Cast Type Hyaline casts N/A Final   Crystals Present (A) N/A Final   Crystal Type Amorphous Sediment N/A Final   Bacteria, UA None seen None seen/Few Final  Urine Culture     Status: Abnormal   Collection Time: 08/22/20 11:36 AM   Specimen: Urine, Clean Catch  Result Value Ref Range Status   Specimen Description   Final    URINE, CLEAN CATCH Performed at [facility], [address] [place], [place] [address]    Special Requests   Final    NONE Performed at [facility], [place],  [place], [place] [address]    Culture >=100,000 COLONIES/mL [name] MORGANII (A)  Final   Report Status 08/24/2020 FINAL  Final   Organism ID, Bacteria [name] MORGANII (A)  Final      Susceptibility   [name] morganii - MIC*    AMPICILLIN >=32 RESISTANT Resistant     CEFAZOLIN >=64 RESISTANT Resistant     CIPROFLOXACIN <=0.25 SENSITIVE Sensitive     GENTAMICIN <=1 SENSITIVE Sensitive     IMIPENEM 2 SENSITIVE Sensitive     NITROFURANTOIN RESISTANT Resistant     TRIMETH/SULFA <=20 SENSITIVE Sensitive     AMPICILLIN/SULBACTAM >=32 RESISTANT Resistant     PIP/TAZO <=4 SENSITIVE Sensitive     * >=100,000 COLONIES/mL [name] MORGANII  Culture, blood (Routine x 2)     Status: None (Preliminary result)   Collection Time: 08/28/20 10:20 AM   Specimen: Right Antecubital; Blood  Result Value Ref Range Status   Specimen Description RIGHT ANTECUBITAL  Final   Special Requests   Final    BOTTLES DRAWN AEROBIC AND ANAEROBIC Blood Culture results may not be optimal due to an inadequate volume of blood received in culture bottles   Culture   Final    NO GROWTH < 24 HOURS Performed at [facility], [address]  [place], [place] [address]    Report Status PENDING  Incomplete  Resp Panel by RT-PCR (Flu A&B, Covid) Nasopharyngeal Swab     Status: None   Collection Time: 08/29/20  4:19 AM   Specimen: Nasopharyngeal Swab; Nasopharyngeal(NP) swabs in vial transport medium  Result Value Ref Range Status   SARS Coronavirus 2 by RT PCR NEGATIVE NEGATIVE Final    Comment: (NOTE) SARS-CoV-2 target nucleic acids are NOT DETECTED.  The SARS-CoV-2 RNA is generally detectable in upper respiratory specimens during the acute phase of infection. The lowest concentration of SARS-CoV-2 viral copies this assay can detect is 138 copies/mL. A negative result does not preclude SARS-Cov-2 infection and should not be used as the sole basis for treatment or other patient management decisions. A negative result may occur with  improper specimen collection/handling, submission of specimen other than nasopharyngeal swab, presence of viral mutation(s) within the areas targeted by this assay, and inadequate number of viral copies(<138 copies/mL). A negative result must be combined with clinical observations, patient history, and epidemiological information. The expected result is Negative.  Fact Sheet for Patients:  EntrepreneurPulse.com.au  Fact Sheet for Healthcare Providers:  IncredibleEmployment.be  This test is no t yet approved or cleared by the [place] FDA and  has been authorized for detection and/or diagnosis of SARS-CoV-2 by FDA under an Emergency Use Authorization (EUA). This EUA will remain  in effect (meaning this test can be used) for the duration of the COVID-19 declaration under Section 564(b)(1) of the Act, 21 U.S.C.section 360bbb-3(b)(1), unless the authorization is terminated  or revoked sooner.       Influenza A by PCR NEGATIVE NEGATIVE Final   Influenza B by PCR NEGATIVE NEGATIVE Final    Comment: (NOTE) The Xpert Xpress SARS-CoV-2/FLU/RSV plus assay is  intended as an aid in the diagnosis of influenza from Nasopharyngeal swab specimens and should not be used as a sole basis for treatment. Nasal washings and aspirates are unacceptable for Xpert Xpress SARS-CoV-2/FLU/RSV testing.  Fact Sheet for Patients: EntrepreneurPulse.com.au  Fact Sheet for Healthcare Providers: IncredibleEmployment.be  This test is not yet approved or cleared by the [place] FDA and has been authorized for detection and/or diagnosis of SARS-CoV-2 by  FDA under an Emergency Use Authorization (EUA). This EUA will remain in effect (meaning this test can be used) for the duration of the COVID-19 declaration under Section 564(b)(1) of the Act, 21 U.S.C. section 360bbb-3(b)(1), unless the authorization is terminated or revoked.  Performed at [facility], [address] [place], [place] [address]          Radiology Studies: CT ABDOMEN PELVIS W CONTRAST  Result Date: 08/28/2020 CLINICAL DATA:  Abdominal pain with nausea and diarrhea. EXAM: CT ABDOMEN AND PELVIS WITH CONTRAST TECHNIQUE: Multidetector CT imaging of the abdomen and pelvis was performed using the standard protocol following bolus administration of intravenous contrast. CONTRAST:  19mL OMNIPAQUE IOHEXOL 300 MG/ML  SOLN COMPARISON:  CT abdomen pelvis dated August 05, 2020. FINDINGS: Lower chest: No acute abnormality.  Chronic cardiomegaly. Hepatobiliary: No focal liver abnormality. Unchanged punctate calcified granuloma in the right liver. Prior cholecystectomy. No biliary dilatation. Unchanged pneumobilia. Pancreas: Unremarkable. No pancreatic ductal dilatation or surrounding inflammatory changes. Spleen: Normal in size without focal abnormality. Adrenals/Urinary Tract: The adrenal glands are unremarkable. Unchanged bilateral renal simple cysts. Unchanged 3 mm calculus in the upper pole and punctate calculus in the lower pole of the left kidney. No  hydronephrosis. The bladder is decompressed by a Foley catheter. Stomach/Bowel: Acute diverticulitis of the proximal to mid sigmoid colon with wall thickening and prominent surrounding inflammatory changes. There is slightly organized fluid with surrounding inflammatory stranding in the mesentery anterior and superior to the sigmoid colon (series 2, image 74). There are a multiple small foci of free air. The remaining colon is decompressed. The cecum is centrally located within the abdomen. Normal appendix. Small hiatal hernia. The stomach is otherwise within normal limits. No obstruction. Vascular/Lymphatic: Aortic atherosclerosis. No enlarged abdominal or pelvic lymph nodes. Reproductive: Chronic severe prostatomegaly. Other: Trace perihepatic, mesenteric, and pelvic ascites. Small volume pneumoperitoneum. Musculoskeletal: No acute or significant osseous findings. IMPRESSION: 1. Acute perforated sigmoid diverticulitis with small volume pneumoperitoneum and ascites. No drainable abscess. 2. Unchanged nonobstructive left nephrolithiasis. 3. Chronic severe prostatomegaly. 4. Aortic Atherosclerosis (ICD10-I70.0). Critical Value/emergent results were called by telephone at the time of interpretation on 08/28/2020 at 3:07 pm to provider [facility] , who verbally acknowledged these results. Electronically Signed   By: Titus Dubin M.D.   On: 08/28/2020 15:07   DG Chest Port 1 View  Result Date: 08/28/2020 CLINICAL DATA:  Possible sepsis. EXAM: PORTABLE CHEST 1 VIEW COMPARISON:  Chest x-ray dated January 14, 2019. FINDINGS: The heart size and mediastinal contours are within normal limits. Prior CABG. Normal pulmonary vascularity. No focal consolidation, pleural effusion, or pneumothorax. No acute osseous abnormality. IMPRESSION: No active disease. Electronically Signed   By: Titus Dubin M.D.   On: 08/28/2020 11:32   DG ABD ACUTE 2+V W 1V CHEST  Result Date: 08/29/2020 CLINICAL DATA:  Perforated sigmoid  diverticulitis EXAM: DG ABDOMEN ACUTE WITH 1 VIEW CHEST COMPARISON:  08/28/2020 CT abdomen/pelvis FINDINGS: Intact sternotomy wires. Stable cardiomediastinal silhouette with top-normal heart size. No pneumothorax. No pleural effusion. No pulmonary edema. Mild left lung base scarring versus atelectasis. A few dilated small bowel loops scattered in the abdomen bilaterally up to 4.3 cm diameter, new from prior CT. No significant air-fluid levels. No appreciable pneumatosis or pneumoperitoneum. Cholecystectomy clips are seen in the right upper quadrant of the abdomen. Clips overlie the prostate. Moderate lumbar spondylosis. Left mid renal 4 mm stone. IMPRESSION: 1. Mild left lung base scarring versus atelectasis. Otherwise no active cardiopulmonary disease. 2. New mildly  dilated small bowel loops scattered in the abdomen bilaterally, favor mild adynamic ileus in the setting of acute diverticulitis. No appreciable pneumoperitoneum. 3. Left mid renal 4 mm stone. Electronically Signed   By: Ilona Sorrel M.D.   On: 08/29/2020 09:50        Scheduled Meds: . aspirin EC  81 mg Oral Daily  . Chlorhexidine Gluconate Cloth  6 each Topical Daily  . dorzolamide-timolol  1 drop Both Eyes BID  . dutasteride  0.5 mg Oral QODAY  . heparin  5,000 Units Subcutaneous Q8H  . ketotifen  1 drop Both Eyes BID  . latanoprost  1 drop Both Eyes QHS  . lidocaine  1 patch Transdermal Q24H  . mirabegron ER  25 mg Oral Daily  . pravastatin  20 mg Oral Daily  . tamsulosin  0.4 mg Oral Daily   Continuous Infusions: . sodium chloride 75 mL/hr at 08/29/20 1039  . sodium chloride 250 mL (08/29/20 0028)  . albumin human    . piperacillin-tazobactam (ZOSYN)  IV 3.375 g (08/29/20 0558)     LOS: 0 days    Time spent: 25 minutes    Sidney Ace, MD [facility] Pager [phone]  If 7PM-7AM, please contact night-coverage 08/29/2020, 12:04 PM

## 2020-08-29 NOTE — Consult Note (Signed)
Patient ID: Bradley [name], male   DOB: [birth year], 80 y.o.   MRN: [id number]  HPI Bradley [name] is a 80 y.o. male seen in consultation at the request of Dr. Tamala Julian.  Him in with acute onset of urinary retention.  He does have a history of a massively enlarged prostate and has been followed by allergy.  He came in with lower abdominal pain that was mild intermittent and sharp type.  The pain radiated to the testicles and to the penis.  He denies any fevers any chills any shortness of breath.  He is hungry.  He is functional and is able to perform more than 4 METS of activity without any shortness of breath or chest pain.  He still drives and is independent.  Does have a significant cardiac history and is status post CABG several years ago.  No recent chest pains.  Does have a prior history of hiatal hernia repair as well as a laparoscopic cholecystectomy. The scan yesterday revealed evidence of diverticulitis with bubbles of gas and some evolving collection within the mesentery.  There is no evidence of defined abscess.  There is no evidence of overt free air note that I have personally reviewed the CT.Marland Kitchen  Post importantly the patient has not showing any signs of peritonitis.  He did have labs this morning showing worsening of his creatinine to 15 and worsening of his white count to 16.  As of that I also repeated an abdominal x-ray I have personally reviewed showing no evidence of free air.  Currently he exhibits no pain.  He is hungry no nausea no vomiting. Did have multiple episodes of loose stool that have improved.  HPI  Past Medical History:  Diagnosis Date   Allergy    Barrett's esophagus    Blockage of coronary artery of heart (HCC)    BPH (benign prostatic hyperplasia)    Cataracts, bilateral    Chronic diastolic CHF (congestive heart failure), NYHA class 2 ([place]) 12/16/2014   Colon polyp    Coronary artery disease    DDD (degenerative disc disease), lumbosacral 12/05/2017   Diabetes mellitus  without complication ([place])    usually running low. to see endocrinologist 01/2020    Essential hypertension 06/05/2014   GERD (gastroesophageal reflux disease)    Barretts esophagus   GI bleed    a. ~2012 around time of hiatal hernia surgery. 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   Glaucoma    Glaucoma    Hemorrhoids    History of chicken pox    History of hiatal hernia 2012   Hyperlipidemia    Meningioma (HCC)    stable, followed by yearly MRIs   Migraines    Neuromuscular disorder ([place])    neuropathies of lower extrems   RBBB    also, AF and bradycardia   Stomach ulcer     Past Surgical History:  Procedure Laterality Date   ATHERECTOMY  1992   CARDIAC CATHETERIZATION N/A 01/07/2015   Procedure: Left Heart Cath;  Surgeon: Corey Skains, MD;  Location: [facility];  Service: Cardiovascular;  Laterality: N/A;   CARDIAC SURGERY     coronary angioplasty   CHOLECYSTECTOMY     COLONOSCOPY  11/03/08, 03/09/14   CORONARY ANGIOPLASTY     CORONARY ARTERY BYPASS GRAFT N/A 01/19/2015   Procedure: CORONARY ARTERY BYPASS GRAFTING (CABG), with LIM to LAD, vein to left intermediate, vein to PD, vein to OM, using right greater saphenous vein via  endovein harvest.;  Surgeon: Grace Isaac, MD;  Location: [facility];  Service: Open Heart Surgery;  Laterality: N/A;   CYSTOSCOPY WITH INSERTION OF UROLIFT N/A 01/13/2020   Procedure: CYSTOSCOPY WITH INSERTION OF UROLIFT;  Surgeon: Abbie Sons, MD;  Location: [facility];  Service: Urology;  Laterality: N/A;   DIAGNOSTIC LAPAROSCOPY     cholecystectomy and hiatal hernia repair   ENDOVEIN HARVEST OF GREATER SAPHENOUS VEIN Right 01/19/2015   Procedure: ENDOVEIN HARVEST OF GREATER SAPHENOUS VEIN;  Surgeon: Grace Isaac, MD;  Location: [facility];  Service: Open Heart Surgery;  Laterality: Right;   ESOPHAGOGASTRODUODENOSCOPY  02/19/13   ESOPHAGOGASTRODUODENOSCOPY (EGD) WITH PROPOFOL N/A 08/09/2015   Procedure:  ESOPHAGOGASTRODUODENOSCOPY (EGD) WITH PROPOFOL;  Surgeon: Hulen Luster, MD;  Location: [facility];  Service: Gastroenterology;  Laterality: N/A;   ESOPHAGOGASTRODUODENOSCOPY (EGD) WITH PROPOFOL N/A 04/13/2017   Procedure: ESOPHAGOGASTRODUODENOSCOPY (EGD) WITH PROPOFOL;  Surgeon: Toledo, Benay Pike, MD;  Location: [facility];  Service: Endoscopy;  Laterality: N/A;   ESOPHAGUS SURGERY     EYE SURGERY     cataracts BIL   HERNIA REPAIR     HIATAL HERNIA REPAIR N/A    POLYPECTOMY     TEE WITHOUT CARDIOVERSION N/A 01/19/2015   Procedure: TRANSESOPHAGEAL ECHOCARDIOGRAM (TEE);  Surgeon: Grace Isaac, MD;  Location: [facility];  Service: Open Heart Surgery;  Laterality: N/A;    Family History  Problem Relation Age of Onset   Parkinsonism Mother    Kidney disease Father    Cancer Father        prostate   Heart disease Father    Cancer Paternal Uncle        esophageal   Cancer Paternal Uncle     Social History Social History   Tobacco Use   Smoking status: Never Smoker   Smokeless tobacco: Never Used  Scientific laboratory technician Use: Never used  Substance Use Topics   Alcohol use: No    Alcohol/week: 0.0 standard drinks   Drug use: No    Allergies  Allergen Reactions   Metoprolol Other (See Comments)    Hypotension   Sulfadiazine Other (See Comments)    unknown   Oxycodone Other (See Comments)    Reaction:  Hypotension    Sulfa Antibiotics Rash    Current Facility-Administered Medications  Medication Dose Route Frequency Provider Last Rate Last Admin   0.9 %  sodium chloride infusion   Intravenous Continuous Ralene Muskrat B, MD 150 mL/hr at 08/29/20 0618 New Bag at 08/29/20 0618   0.9 %  sodium chloride infusion   Intravenous PRN Cox, Amy N, DO 10 mL/hr at 08/29/20 0028 250 mL at 08/29/20 0028   acetaminophen (TYLENOL) tablet 325 mg  325 mg Oral Q6H PRN Cox, Amy N, DO       Or   acetaminophen (TYLENOL) suppository 325 mg  325 mg Rectal Q6H PRN Cox, Amy N, DO       albumin  human 25 % solution 25 g  25 g Intravenous Once Ariann Khaimov F, MD       aspirin EC tablet 81 mg  81 mg Oral Daily Cox, Amy N, DO       Chlorhexidine Gluconate Cloth 2 % PADS 6 each  6 each Topical Daily Cox, Amy N, DO       dorzolamide-timolol (COSOPT) 22.3-6.8 MG/ML ophthalmic solution 1 drop  1 drop Both Eyes BID Cox, Amy N, DO   1 drop at 08/28/20 2332  dutasteride (AVODART) capsule 0.5 mg  0.5 mg Oral QODAY Cox, Amy N, DO       heparin injection 5,000 Units  5,000 Units Subcutaneous Q8H Cox, Amy N, DO   5,000 Units at 08/29/20 0558   ketotifen (ZADITOR) 0.025 % ophthalmic solution 1 drop  1 drop Both Eyes BID Sharion Settler, NP   1 drop at 08/28/20 2336   latanoprost (XALATAN) 0.005 % ophthalmic solution 1 drop  1 drop Both Eyes QHS Cox, Amy N, DO       lidocaine (LIDODERM) 5 % 1 patch  1 patch Transdermal Q24H Ralene Muskrat B, MD       mirabegron ER (MYRBETRIQ) tablet 25 mg  25 mg Oral Daily Cox, Amy N, DO   25 mg at 08/28/20 1714   morphine 4 MG/ML injection 4 mg  4 mg Intravenous Q3H PRN Sreenath, Sudheer B, MD       ondansetron (ZOFRAN) tablet 4 mg  4 mg Oral Q6H PRN Cox, Amy N, DO       Or   ondansetron (ZOFRAN) injection 4 mg  4 mg Intravenous Q6H PRN Cox, Amy N, DO       piperacillin-tazobactam (ZOSYN) IVPB 3.375 g  3.375 g Intravenous Q8H Cox, Amy N, DO 12.5 mL/hr at 08/29/20 0558 3.375 g at 08/29/20 1610   polyvinyl alcohol (LIQUIFILM TEARS) 1.4 % ophthalmic solution 1 drop  1 drop Both Eyes Daily PRN Cox, Amy N, DO       pravastatin (PRAVACHOL) tablet 20 mg  20 mg Oral Daily Cox, Amy N, DO       tamsulosin (FLOMAX) capsule 0.4 mg  0.4 mg Oral Daily Cox, Amy N, DO         Review of Systems Full ROS  was asked and was negative except for the information on the HPI  Physical Exam Blood pressure (!) 103/45, pulse 65, temperature 97.6 F (36.4 C), resp. rate 18, height 6' (1.829 m), weight 102.1 kg, SpO2 92 %. CONSTITUTIONAL: NAD EYES: Pupils are equal, round, , Sclera  are non-icteric. EARS, NOSE, MOUTH AND THROAT: He is wearing a mask. Hearing is intact to voice. LYMPH NODES:  Lymph nodes in the neck are normal. RESPIRATORY:  Lungs are clear. There is normal respiratory effort, with equal breath sounds bilaterally, and without pathologic use of accessory muscles. CARDIOVASCULAR: Heart is regular without murmurs, gallops, or rubs. GI: The abdomen is  soft, mild TTP LLQ, no rebound , no peritonitis, and nondistended. There are no palpable masses. There is no hepatosplenomegaly. There are normal bowel sounds in all quadrants. GU: Rectal deferred.   MUSCULOSKELETAL: Normal muscle strength and tone. No cyanosis or edema.   SKIN: Turgor is good and there are no pathologic skin lesions or ulcers. NEUROLOGIC: Motor and sensation is grossly normal. Cranial nerves are grossly intact. PSYCH:  Oriented to person, place and time. Affect is normal.  Data Reviewed  I have personally reviewed the patient's imaging, laboratory findings and medical records.    Assessment/Plan -80 year-old male with contained perforated diverticulitis and questionable evolving abscess.  Currently patient is not peritoneal nor septic and although his white count went up as well as his creatinine clinically his abdomen is soft and with very minimal tenderness to palpation.  I had an extensive discussion with the patient regarding his disease process.  Options of Hartman's versus medical intervention.  Currently I do think that we still have plenty of room to manage him medically.  He understands  that if he were to deteriorate he may need to have a Hartman's procedure during this hospitalization.  I firmly believe that he will benefit from elective sigmoid colectomy in an elective setting. Sinew IV resuscitation and broad-spectrum antibiotics with serial abdominal exams.  As a precaution I will keep him n.p.o. for today.  Continue to follow him closely. D/w Dr. Priscella Mann in detail  Caroleen Hamman, MD  [place] Surgeon 08/29/2020, 9:32 AM

## 2020-08-30 DIAGNOSIS — R310 Gross hematuria: Secondary | ICD-10-CM

## 2020-08-30 DIAGNOSIS — R319 Hematuria, unspecified: Secondary | ICD-10-CM

## 2020-08-30 LAB — CBC WITH DIFFERENTIAL/PLATELET
Abs Immature Granulocytes: 0.1 10*3/uL — ABNORMAL HIGH (ref 0.00–0.07)
Basophils Absolute: 0 10*3/uL (ref 0.0–0.1)
Basophils Relative: 0 %
Eosinophils Absolute: 0.3 10*3/uL (ref 0.0–0.5)
Eosinophils Relative: 3 %
HCT: 33.2 % — ABNORMAL LOW (ref 39.0–52.0)
Hemoglobin: 10.7 g/dL — ABNORMAL LOW (ref 13.0–17.0)
Immature Granulocytes: 1 %
Lymphocytes Relative: 8 %
Lymphs Abs: 0.9 10*3/uL (ref 0.7–4.0)
MCH: 31.8 pg (ref 26.0–34.0)
MCHC: 32.2 g/dL (ref 30.0–36.0)
MCV: 98.5 fL (ref 80.0–100.0)
Monocytes Absolute: 0.4 10*3/uL (ref 0.1–1.0)
Monocytes Relative: 4 %
Neutro Abs: 9.7 10*3/uL — ABNORMAL HIGH (ref 1.7–7.7)
Neutrophils Relative %: 84 %
Platelets: 260 10*3/uL (ref 150–400)
RBC: 3.37 MIL/uL — ABNORMAL LOW (ref 4.22–5.81)
RDW: 13.2 % (ref 11.5–15.5)
WBC: 11.5 10*3/uL — ABNORMAL HIGH (ref 4.0–10.5)
nRBC: 0 % (ref 0.0–0.2)

## 2020-08-30 LAB — COMPREHENSIVE METABOLIC PANEL
ALT: 20 U/L (ref 0–44)
AST: 22 U/L (ref 15–41)
Albumin: 2.9 g/dL — ABNORMAL LOW (ref 3.5–5.0)
Alkaline Phosphatase: 46 U/L (ref 38–126)
Anion gap: 8 (ref 5–15)
BUN: 20 mg/dL (ref 8–23)
CO2: 23 mmol/L (ref 22–32)
Calcium: 8.9 mg/dL (ref 8.9–10.3)
Chloride: 110 mmol/L (ref 98–111)
Creatinine, Ser: 1.54 mg/dL — ABNORMAL HIGH (ref 0.61–1.24)
GFR, Estimated: 45 mL/min — ABNORMAL LOW (ref >60)
Glucose, Bld: 113 mg/dL — ABNORMAL HIGH (ref 70–99)
Potassium: 4 mmol/L (ref 3.5–5.1)
Sodium: 141 mmol/L (ref 135–145)
Total Bilirubin: 0.6 mg/dL (ref 0.3–1.2)
Total Protein: 5.7 g/dL — ABNORMAL LOW (ref 6.5–8.1)

## 2020-08-30 LAB — GLUCOSE, CAPILLARY
Glucose-Capillary: 100 mg/dL — ABNORMAL HIGH (ref 70–99)
Glucose-Capillary: 104 mg/dL — ABNORMAL HIGH (ref 70–99)
Glucose-Capillary: 109 mg/dL — ABNORMAL HIGH (ref 70–99)
Glucose-Capillary: 109 mg/dL — ABNORMAL HIGH (ref 70–99)
Glucose-Capillary: 122 mg/dL — ABNORMAL HIGH (ref 70–99)
Glucose-Capillary: 186 mg/dL — ABNORMAL HIGH (ref 70–99)

## 2020-08-30 MED ORDER — BOOST / RESOURCE BREEZE PO LIQD CUSTOM
1.0000 | Freq: Three times a day (TID) | ORAL | Status: DC
  Administered 2020-08-30 – 2020-09-01 (×4): 1 via ORAL

## 2020-08-30 NOTE — Consult Note (Signed)
Urology Consult  I have been asked to see the patient by Dr. Priscella Mann, for evaluation and management of gross hematuria.  Chief Complaint: Gross hematuria  History of Present Illness: Bradley [name] is a 80 y.o. year old male with a complex urologic history, detailed below, well-known to our practice.  He was admitted on 08/28/2020 with diverticulitis with perforation while being treated for [name] cystitis and developed gross hematuria this morning.  Urology was consulted for evaluation of his gross hematuria.  Urologic history notable for BPH with prostatomegaly s/p UroLift in 2021 on dutasteride and Flomax and more recently intermittent gross hematuria who underwent cystoscopy with Dr. Bernardo Heater on 08/20/2020 with findings of significant prostatic enlargement believed to be the source of his hematuria.  He presented to the emergency department 2 days later with reports of the sensation of incomplete bladder emptying.  UA was grossly infected with >50 RBCs/hpf, >50 WBCs/hpf, many bacteria, and WBC clumps; bladder scan resulted with 214 mL.  He was discharged on Keflex 500 mg 3 times daily.  Urine culture ultimately resulted with multidrug-resistant [name] morganii and Dr. Bernardo Heater switched him to Cipro 500 mg twice daily x7 days.  Patient presented again to the emergency department 2 days ago with reports of lower abdominal and pelvic pain with associated nausea and stool incontinence.  This time, PVR was around 500 mL and Foley catheter was placed for management of urinary retention.  CTAP with contrast was performed which revealed acute diverticulitis with perforation.  He was admitted for management of this and started on antibiotics as below.  Urinalysis this day notable for >50 RBCs/hpf, 6-10 WBCs/hpf, rare bacteria, no nitrites, no WBC clumps.  Today patient reports clear urine following Foley catheter placement.  He states he developed gross hematuria acutely this morning when he strained  to have a bowel movement.  He denies catheter pulls or pain.  He states his urine has been clearing on its own.  No acute concerns today.  On exam, Foley catheter in place with cherry red urine clearing to pink-tinged.  Anti-infectives (From admission, onward)   Start     Dose/Rate Route Frequency Ordered Stop   08/28/20 2350  piperacillin-tazobactam (ZOSYN) IVPB 3.375 g  Status:  Discontinued        3.375 g 100 mL/hr over 30 Minutes Intravenous Every 6 hours 08/28/20 1613 08/28/20 1630   08/28/20 2200  piperacillin-tazobactam (ZOSYN) IVPB 3.375 g        3.375 g 12.5 mL/hr over 240 Minutes Intravenous Every 8 hours 08/28/20 1630     08/28/20 1530  piperacillin-tazobactam (ZOSYN) IVPB 3.375 g        3.375 g 100 mL/hr over 30 Minutes Intravenous  Once 08/28/20 1519 08/28/20 1635      Past Medical History:  Diagnosis Date  . Allergy   . Barrett's esophagus   . Blockage of coronary artery of heart ([place])   . BPH (benign prostatic hyperplasia)   . Cataracts, bilateral   . Chronic diastolic CHF (congestive heart failure), NYHA class 2 ([place]) 12/16/2014  . Colon polyp   . Coronary artery disease   . DDD (degenerative disc disease), lumbosacral 12/05/2017  . Diabetes mellitus without complication ([place])    usually running low. to see endocrinologist 01/2020   . Essential hypertension 06/05/2014  . GERD (gastroesophageal reflux disease)    Barretts esophagus  . GI bleed    a. ~2012 around time of hiatal hernia surgery. Developed melena/BRBPR, was placed back on Dexilant  long-term.  . Glaucoma   . Glaucoma   . Hemorrhoids   . History of chicken pox   . History of hiatal hernia 2012  . Hyperlipidemia   . Meningioma ([place])    stable, followed by yearly MRIs  . Migraines   . Neuromuscular disorder (H[facility])    neuropathies of lower extrems  . RBBB    also, AF and bradycardia  . Stomach ulcer     Past Surgical History:  Procedure Laterality Date  . ATHERECTOMY  1992  . CARDIAC  CATHETERIZATION N/A 01/07/2015   Procedure: Left Heart Cath;  Surgeon: Corey Skains, MD;  Location: [facility];  Service: Cardiovascular;  Laterality: N/A;  . CARDIAC SURGERY     coronary angioplasty  . CHOLECYSTECTOMY    . COLONOSCOPY  11/03/08, 03/09/14  . CORONARY ANGIOPLASTY    . CORONARY ARTERY BYPASS GRAFT N/A 01/19/2015   Procedure: CORONARY ARTERY BYPASS GRAFTING (CABG), with LIM to LAD, vein to left intermediate, vein to PD, vein to OM, using right greater saphenous vein via endovein harvest.;  Surgeon: Grace Isaac, MD;  Location: [facility];  Service: Open Heart Surgery;  Laterality: N/A;  . CYSTOSCOPY WITH INSERTION OF UROLIFT N/A 01/13/2020   Procedure: CYSTOSCOPY WITH INSERTION OF UROLIFT;  Surgeon: Abbie Sons, MD;  Location: [facility];  Service: Urology;  Laterality: N/A;  . DIAGNOSTIC LAPAROSCOPY     cholecystectomy and hiatal hernia repair  . ENDOVEIN HARVEST OF GREATER SAPHENOUS VEIN Right 01/19/2015   Procedure: ENDOVEIN HARVEST OF GREATER SAPHENOUS VEIN;  Surgeon: Grace Isaac, MD;  Location: [facility];  Service: Open Heart Surgery;  Laterality: Right;  . ESOPHAGOGASTRODUODENOSCOPY  02/19/13  . ESOPHAGOGASTRODUODENOSCOPY (EGD) WITH PROPOFOL N/A 08/09/2015   Procedure: ESOPHAGOGASTRODUODENOSCOPY (EGD) WITH PROPOFOL;  Surgeon: Hulen Luster, MD;  Location: [facility];  Service: Gastroenterology;  Laterality: N/A;  . ESOPHAGOGASTRODUODENOSCOPY (EGD) WITH PROPOFOL N/A 04/13/2017   Procedure: ESOPHAGOGASTRODUODENOSCOPY (EGD) WITH PROPOFOL;  Surgeon: Toledo, Benay Pike, MD;  Location: [facility];  Service: Endoscopy;  Laterality: N/A;  . ESOPHAGUS SURGERY    . EYE SURGERY     cataracts BIL  . HERNIA REPAIR    . HIATAL HERNIA REPAIR N/A   . POLYPECTOMY    . TEE WITHOUT CARDIOVERSION N/A 01/19/2015   Procedure: TRANSESOPHAGEAL ECHOCARDIOGRAM (TEE);  Surgeon: Grace Isaac, MD;  Location: [facility];  Service: Open Heart Surgery;  Laterality: N/A;    Home Medications:   Current Meds  Medication Sig  . aspirin EC 81 MG tablet Take 81 mg by mouth daily.  . Brinzolamide-Brimonidine 1-0.2 % SUSP Place 1 drop into both eyes 2 (two) times daily.  . cetirizine (ZYRTEC) 10 MG tablet Take 10 mg by mouth daily.   . ciprofloxacin (CIPRO) 500 MG tablet Take 1 tablet (500 mg total) by mouth every 12 (twelve) hours.  . cyanocobalamin 2000 MCG tablet Take 2,000 mcg by mouth daily.   Marland Kitchen docusate sodium (COLACE) 100 MG capsule Take 1 capsule (100 mg total) by mouth every 12 (twelve) hours.  . dorzolamide-timolol (COSOPT) 22.3-6.8 MG/ML ophthalmic solution Apply 1 drop to eye 2 (two) times daily.  Marland Kitchen dutasteride (AVODART) 0.5 MG capsule Take 0.5 mg by mouth every other day.  . latanoprost (XALATAN) 0.005 % ophthalmic solution Place 1 drop into both eyes at bedtime.  . Magnesium 250 MG TABS Take 250 mg by mouth daily.   Marland Kitchen omeprazole (PRILOSEC) 40 MG capsule Take 40 mg by mouth daily.  Marland Kitchen  pravastatin (PRAVACHOL) 20 MG tablet Take 20 mg by mouth daily.  . tamsulosin (FLOMAX) 0.4 MG CAPS capsule Take 1 capsule (0.4 mg total) by mouth daily.  . timolol (TIMOPTIC) 0.25 % ophthalmic solution Place 1 drop into both eyes daily.    Allergies:  Allergies  Allergen Reactions  . Metoprolol Other (See Comments)    Hypotension  . Sulfadiazine Other (See Comments)    unknown  . Oxycodone Other (See Comments)    Reaction:  Hypotension   . Sulfa Antibiotics Rash    Family History  Problem Relation Age of Onset  . Parkinsonism Mother   . Kidney disease Father   . Cancer Father        prostate  . Heart disease Father   . Cancer Paternal Uncle        esophageal  . Cancer Paternal Uncle     Social History:  reports that he has never smoked. He has never used smokeless tobacco. He reports that he does not drink alcohol and does not use drugs.  ROS: A complete review of systems was performed.  All systems are negative except for pertinent findings as noted.  Physical Exam:   Vital signs in last 24 hours: Temp:  [97.7 F (36.5 C)-99.7 F (37.6 C)] 98 F (36.7 C) (02/14 0431) Pulse Rate:  [58-66] 60 (02/14 0431) Resp:  [16-20] 20 (02/14 0431) BP: (102-115)/(46-57) 106/56 (02/14 0431) SpO2:  [91 %-97 %] 91 % (02/14 0431) Constitutional:  Alert and oriented, no acute distress HEENT: [place] AT, moist mucus membranes.  Trachea midline, no masses Cardiovascular: Regular rate and rhythm, no clubbing, cyanosis, or edema. Respiratory: Normal respiratory effort, lungs clear bilaterally GI: Abdomen is distended GU: Foley catheter irrigates easily with no clots retrieved Skin: No rashes, bruises or suspicious lesions Neurologic: Grossly intact, no focal deficits, moving all 4 extremities Psychiatric: Normal mood and affect  Laboratory Data:  Recent Labs    08/28/20 1020 08/29/20 0457 08/30/20 0533  WBC 9.0 16.8*  16.8* 11.5*  HGB 14.5 11.3*  11.2* 10.7*  HCT 46.5 34.7*  33.9* 33.2*   Recent Labs    08/28/20 1020 08/29/20 0457 08/30/20 0533  NA 137 140 141  K 4.5 3.9 4.0  CL 105 110 110  CO2 20* 23 23  GLUCOSE 108* 101* 113*  BUN 19 21 20   CREATININE 1.39* 1.50* 1.54*  CALCIUM 9.6 8.6* 8.9   Recent Labs    08/29/20 0457  INR 1.4*   Urinalysis    Component Value Date/Time   COLORURINE YELLOW (A) 08/28/2020 1105   APPEARANCEUR HAZY (A) 08/28/2020 1105   APPEARANCEUR Cloudy (A) 08/20/2020 1101   LABSPEC 1.014 08/28/2020 1105   LABSPEC 1.025 08/25/2011 1430   PHURINE 6.0 08/28/2020 1105   GLUCOSEU NEGATIVE 08/28/2020 1105   GLUCOSEU Negative 08/25/2011 1430   HGBUR SMALL (A) 08/28/2020 1105   BILIRUBINUR NEGATIVE 08/28/2020 1105   BILIRUBINUR Negative 08/20/2020 1101   BILIRUBINUR Negative 08/25/2011 1430   KETONESUR NEGATIVE 08/28/2020 1105   PROTEINUR NEGATIVE 08/28/2020 1105   UROBILINOGEN 0.2 01/15/2015 1041   NITRITE NEGATIVE 08/28/2020 1105   LEUKOCYTESUR NEGATIVE 08/28/2020 1105   LEUKOCYTESUR Negative 08/25/2011 1430   Results  for orders placed or performed during the hospital encounter of 08/28/20  Culture, blood (Routine x 2)     Status: None (Preliminary result)   Collection Time: 08/28/20 10:20 AM   Specimen: Right Antecubital; Blood  Result Value Ref Range Status   Specimen Description RIGHT ANTECUBITAL  Final   Special Requests   Final    BOTTLES DRAWN AEROBIC AND ANAEROBIC Blood Culture results may not be optimal due to an inadequate volume of blood received in culture bottles   Culture   Final    NO GROWTH 2 DAYS Performed at [facility], [address] [place], [place] [address]    Report Status PENDING  Incomplete  Resp Panel by RT-PCR (Flu A&B, Covid) Nasopharyngeal Swab     Status: None   Collection Time: 08/29/20  4:19 AM   Specimen: Nasopharyngeal Swab; Nasopharyngeal(NP) swabs in vial transport medium  Result Value Ref Range Status   SARS Coronavirus 2 by RT PCR NEGATIVE NEGATIVE Final    Comment: (NOTE) SARS-CoV-2 target nucleic acids are NOT DETECTED.  The SARS-CoV-2 RNA is generally detectable in upper respiratory specimens during the acute phase of infection. The lowest concentration of SARS-CoV-2 viral copies this assay can detect is 138 copies/mL. A negative result does not preclude SARS-Cov-2 infection and should not be used as the sole basis for treatment or other patient management decisions. A negative result may occur with  improper specimen collection/handling, submission of specimen other than nasopharyngeal swab, presence of viral mutation(s) within the areas targeted by this assay, and inadequate number of viral copies(<138 copies/mL). A negative result must be combined with clinical observations, patient history, and epidemiological information. The expected result is Negative.  Fact Sheet for Patients:  EntrepreneurPulse.com.au  Fact Sheet for Healthcare Providers:  IncredibleEmployment.be  This test is no t yet approved  or cleared by the [place] FDA and  has been authorized for detection and/or diagnosis of SARS-CoV-2 by FDA under an Emergency Use Authorization (EUA). This EUA will remain  in effect (meaning this test can be used) for the duration of the COVID-19 declaration under Section 564(b)(1) of the Act, 21 U.S.C.section 360bbb-3(b)(1), unless the authorization is terminated  or revoked sooner.       Influenza A by PCR NEGATIVE NEGATIVE Final   Influenza B by PCR NEGATIVE NEGATIVE Final    Comment: (NOTE) The Xpert Xpress SARS-CoV-2/FLU/RSV plus assay is intended as an aid in the diagnosis of influenza from Nasopharyngeal swab specimens and should not be used as a sole basis for treatment. Nasal washings and aspirates are unacceptable for Xpert Xpress SARS-CoV-2/FLU/RSV testing.  Fact Sheet for Patients: EntrepreneurPulse.com.au  Fact Sheet for Healthcare Providers: IncredibleEmployment.be  This test is not yet approved or cleared by the [place] FDA and has been authorized for detection and/or diagnosis of SARS-CoV-2 by FDA under an Emergency Use Authorization (EUA). This EUA will remain in effect (meaning this test can be used) for the duration of the COVID-19 declaration under Section 564(b)(1) of the Act, 21 U.S.C. section 360bbb-3(b)(1), unless the authorization is terminated or revoked.  Performed at [facility], [address] [place], [place] [address]     Radiologic Imaging: CT ABDOMEN PELVIS W CONTRAST  Result Date: 08/28/2020 CLINICAL DATA:  Abdominal pain with nausea and diarrhea. EXAM: CT ABDOMEN AND PELVIS WITH CONTRAST TECHNIQUE: Multidetector CT imaging of the abdomen and pelvis was performed using the standard protocol following bolus administration of intravenous contrast. CONTRAST:  177mL OMNIPAQUE IOHEXOL 300 MG/ML  SOLN COMPARISON:  CT abdomen pelvis dated August 05, 2020. FINDINGS: Lower chest: No acute  abnormality.  Chronic cardiomegaly. Hepatobiliary: No focal liver abnormality. Unchanged punctate calcified granuloma in the right liver. Prior cholecystectomy. No biliary dilatation. Unchanged pneumobilia. Pancreas: Unremarkable. No pancreatic ductal dilatation or surrounding inflammatory  changes. Spleen: Normal in size without focal abnormality. Adrenals/Urinary Tract: The adrenal glands are unremarkable. Unchanged bilateral renal simple cysts. Unchanged 3 mm calculus in the upper pole and punctate calculus in the lower pole of the left kidney. No hydronephrosis. The bladder is decompressed by a Foley catheter. Stomach/Bowel: Acute diverticulitis of the proximal to mid sigmoid colon with wall thickening and prominent surrounding inflammatory changes. There is slightly organized fluid with surrounding inflammatory stranding in the mesentery anterior and superior to the sigmoid colon (series 2, image 74). There are a multiple small foci of free air. The remaining colon is decompressed. The cecum is centrally located within the abdomen. Normal appendix. Small hiatal hernia. The stomach is otherwise within normal limits. No obstruction. Vascular/Lymphatic: Aortic atherosclerosis. No enlarged abdominal or pelvic lymph nodes. Reproductive: Chronic severe prostatomegaly. Other: Trace perihepatic, mesenteric, and pelvic ascites. Small volume pneumoperitoneum. Musculoskeletal: No acute or significant osseous findings. IMPRESSION: 1. Acute perforated sigmoid diverticulitis with small volume pneumoperitoneum and ascites. No drainable abscess. 2. Unchanged nonobstructive left nephrolithiasis. 3. Chronic severe prostatomegaly. 4. Aortic Atherosclerosis (ICD10-I70.0). Critical Value/emergent results were called by telephone at the time of interpretation on 08/28/2020 at 3:07 pm to provider [facility] , who verbally acknowledged these results. Electronically Signed   By: Titus Dubin M.D.   On: 08/28/2020 15:07   Assessment  & Plan:  80 year old male with PMH BPH with prostatomegaly on dutasteride and Flomax and intermittent gross hematuria of likely prostatic origin admitted for management of diverticulitis with perforation, also with acute urinary retention in the setting of [name] cystitis, now with recurrent gross hematuria clearing on its own.  Suspect his urinary retention is secondary to urinary infection in this patient with enlarged prostate.  He is on Zosyn, which should cover both UTI and diverticulitis.  Gross hematuria started this morning in the setting of straining to have a bowel movement.  Suspect prostatic bleeding with catheter irritation as contributory.  I irrigated the patient's catheter at the bedside today without difficulty.  I am not concerned for clot retention at this time and his urine is notably clearing on its own.  No further intervention indicated.  Notably, patient will be at elevated risk of colovesical fistula in the setting of his diverticulitis.  May consider repeat imaging in the future if he develops recurrent urinary infections, pneumaturia, or fecaluria.  Recommendations: -Continue antibiotics for [name] cystitis for a total of 12 days of culture appropriate therapy -Continue Foley catheter with plans for outpatient voiding trial in 7-10 days -Continue doxazosin and finasteride for management of prostatomegaly -Hand irrigate Foley catheter as needed for nondraining catheter passage of clots  Thank you for involving me in this patient's care, please page with any further questions or concerns.  Debroah Loop, PA-C 08/30/2020 12:46 PM

## 2020-08-30 NOTE — Progress Notes (Signed)
[facility] SURGICAL PROGRESS NOTE (cpt [phone])  Hospital Day(s): 1.   Interval History: Patient seen and examined, no acute events or new complaints overnight. Patient reports he is "not feeling as good" this morning. He mainly complains of mild nausea. No fever, chills, or emesis. He has some LLQ soreness but has never endorsed significant abdominal pain. Leukocytosis is improving to 11.5K this morning (from 16.8). Renal function appears baseline with sCr - 1.54. No significant electrolyte derangements. He is currently NPO. Continues on Zosyn.  Review of Systems:  Constitutional: denies fever, chills  HEENT: denies cough or congestion  Respiratory: denies any shortness of breath  Cardiovascular: denies chest pain or palpitations  Gastrointestinal: + Nausea (mild), denies abdominal pain, vomiting, or diarrhea/and bowel function as per interval history Genitourinary: denies burning with urination or urinary frequency   Vital signs in last 24 hours: [min-max] current  Temp:  [97.6 F (36.4 C)-99.7 F (37.6 C)] 98 F (36.7 C) (02/14 0431) Pulse Rate:  [58-66] 60 (02/14 0431) Resp:  [16-20] 20 (02/14 0431) BP: (102-115)/(45-57) 106/56 (02/14 0431) SpO2:  [91 %-98 %] 91 % (02/14 0431)     Height: 6' (182.9 cm) Weight: 102.1 kg BMI (Calculated): 30.51   Intake/Output last 2 shifts:  02/13 0701 - 02/14 0700 In: 2324.9 [I.V.:2074.9; IV Piggyback:250] Out: 500 [Urine:500]   Physical Exam:  Constitutional: alert, cooperative and no distress  HENT: normocephalic without obvious abnormality  Eyes: PERRL, EOM's grossly intact and symmetric  Respiratory: breathing non-labored at rest  Cardiovascular: regular rate and sinus rhythm  Gastrointestinal: Soft, mild LLQ soreness, non-distended, no rebound/guarding Genitourinary: Foley in place, he does appear to have some hematuria present   Labs:  CBC Latest Ref Rng & Units 08/30/2020 08/29/2020 08/29/2020  WBC 4.0 - 10.5 K/uL  11.5(H) 16.8(H) 16.8(H)  Hemoglobin 13.0 - 17.0 g/dL 10.7(L) 11.3(L) 11.2(L)  Hematocrit 39.0 - 52.0 % 33.2(L) 34.7(L) 33.9(L)  Platelets 150 - 400 K/uL 260 235 237   CMP Latest Ref Rng & Units 08/30/2020 08/29/2020 08/28/2020  Glucose 70 - 99 mg/dL 113(H) 101(H) 108(H)  BUN 8 - 23 mg/dL 20 21 19   Creatinine 0.61 - 1.24 mg/dL 1.54(H) 1.50(H) 1.39(H)  Sodium 135 - 145 mmol/L 141 140 137  Potassium 3.5 - 5.1 mmol/L 4.0 3.9 4.5  Chloride 98 - 111 mmol/L 110 110 105  CO2 22 - 32 mmol/L 23 23 20(L)  Calcium 8.9 - 10.3 mg/dL 8.9 8.6(L) 9.6  Total Protein 6.5 - 8.1 g/dL 5.7(L) - 7.2  Total Bilirubin 0.3 - 1.2 mg/dL 0.6 - 0.8  Alkaline Phos 38 - 126 U/L 46 - 72  AST 15 - 41 U/L 22 - 30  ALT 0 - 44 U/L 20 - 28    Imaging studies: No new pertinent imaging studies   Assessment/Plan: (ICD-10's: K67.20) 80 y.o. male with mild nausea but otherwise improvement in leukocytosis and abdominal pain admitted with perforated diverticulitis   - Will trial CLD  - Continue IV Resuscitation  - Continue IV ABx (Zosyn)  - Pending clinical condition, he may benefit from repeat imaging in the next 24-48 hours  - Monitor abdominal examination; on-going bowel function  - Pain control prn; antiemetics prn  - Morning labs; CBC/BMP  - Mobilization encouraged  - Further management per primary service; we will follow   All of the above findings and recommendations were discussed with the patient, and the medical team, and all of patient's questions were answered to his expressed satisfaction.   --  Edison Simon, PA-C [facility] 08/30/2020, 7:19 AM [phone] M-F: 7am - 4pm

## 2020-08-30 NOTE — Telephone Encounter (Signed)
Spoke to patient this morning and he is currently admitted in the hospital. Patient states he has Diverticulitis and a whole in his intestine. He states he feels he is getting some better today. They have him on IV ABX.

## 2020-08-30 NOTE — Progress Notes (Signed)
PROGRESS NOTE    Bradley BARSKY  WNI:[id number] DOB: Aug 26, 1940 DOA: 08/28/2020 PCP: Sofie Hartigan, MD  Brief Narrative:  80 y.o. male with medical history significant for history of prostate cancer, BPH with urinary retention, recent urinary tract infection, hyperlipidemia, acid reflux, chronic bilateral low back pain with right-sided sciatica, foraminal stenosis of the lumbar region, presented to the emergency department for chief concerns of abdominal pain and urinary retention.  He reports he has felt weak with periumbilical ache for about 1-2 weeks. He endorses nausea and denies vomiting. He endorses one episode of watery diarrhea last week and had one episode of diarrhea in the ED on day of presentation. He denies chest pain and shortness of breath. On physical exam, he has tenderness of the left lower quadrant with palpation.  CT imaging survey revealed diverticulitis with possible small contained perforation.  General surgery was involved with recommendations for n.p.o. status and serial abdominal exams.   2/14: None patient seen and examined.  Hemodynamically stable.  Abdominal exam reassuring.  Discussed with Dr. Dahlia Byes general surgery.  Currently no plans for emergent surgical intervention during this admission.  We will continue with conservative management, serial abdominal exams, slow diet advancement.  Patient had acute onset hematuria on 08/29/2020.  Given recent bladder instrumentation will involve urology service.   Assessment & Plan:   Active Problems:   Benign essential HTN   CA in situ prostate   S/P CABG x 4   GERD (gastroesophageal reflux disease)   Mixed hyperlipidemia   Bradycardia   Diverticulitis of intestine with perforation   Perforated diverticulum  Hematuria Acute onset, 08/29/2020 Likely related to marked prostate enlargement and recent bladder is mentation Painless hematuria Plan: Urology consulted, communicated with PA Hold subcu heparin Daily  CBC  Acute perforated sigmoid diverticulitis Patient endorses weakness and pain he has never felt before CT imaging survey revealed the above General surgery involved Serial abdominal exams reassuring Currently no plans for surgical intervention Plan: Continue IV Zosyn Continue fluid resuscitation IV fluids Pain control as needed Antiemetics as needed Serial abdominal exams Case discussed with Dr. Dahlia Byes  Recent bladder instrumentation [name] UTI Patient was previously on ciprofloxacin for UTI associated with the recent cystoscopy Hematuria noted 08/29/2020 We will maintain Foley catheter in place IV Zosyn as above  Hyperlipidemia Statin  BPH Finasteride Mirabegron Tamsulosin  History of coronary disease status post CABG 2016 LIMA to LAD, saphenous to OM 1 No acute issues Outpatient follow-up    DVT prophylaxis: SCDs Code Status: DNR Family Communication: None today.  Offered to call but patient declined Disposition Plan: Status is: Inpatient  Remains inpatient appropriate because:Inpatient level of care appropriate due to severity of illness   Dispo: The patient is from: Home              Anticipated d/c is to: Home              Anticipated d/c date is: 2 days              Patient currently is not medically stable to d/c.   Difficult to place patient No   Projected disposition back home.  Functional status good.  Advancing diet to clear liquids today.  Attempting conservative medical management at the moment.  Likely disposition within 48 hours.           Level of care: Med-Surg  Consultants:   General surgery  Procedures:   None  Antimicrobials:   Zosyn   Subjective:  Patient seen and examined.  Sitting up in chair.  Improved abdominal pain.  Hematuria noted.  No other issues  Objective: Vitals:   08/29/20 1534 08/29/20 2036 08/30/20 0038 08/30/20 0431  BP: (!) 115/57 (!) 105/48 (!) 102/46 (!) 106/56  Pulse: (!) 58 66 61 60   Resp: 16  20 20   Temp: 97.7 F (36.5 C) 99.7 F (37.6 C) 98.1 F (36.7 C) 98 F (36.7 C)  TempSrc: Oral     SpO2: 97% 92% 91% 91%  Weight:      Height:        Intake/Output Summary (Last 24 hours) at 08/30/2020 0620 Last data filed at 08/30/2020 0131 Gross per 24 hour  Intake 2324.93 ml  Output 500 ml  Net 1824.93 ml   Filed Weights   08/28/20 0959  Weight: 102.1 kg    Examination:  General exam: Appears calm and comfortable  Respiratory system: Clear to auscultation. Respiratory effort normal. Cardiovascular system: S1 & S2 heard, RRR. No JVD, murmurs, rubs, gallops or clicks. No pedal edema. Gastrointestinal system: Obese, mild tender to palpation lower abdomen.  Normal bowel sounds  Genitourinary system: Foley catheter in place, hematuria noted Central nervous system: Alert and oriented. No focal neurological deficits. Extremities: Symmetric 5 x 5 power. Skin: No rashes, lesions or ulcers Psychiatry: Judgement and insight appear normal. Mood & affect appropriate.     Data Reviewed: I have personally reviewed following labs and imaging studies  CBC: Recent Labs  Lab 08/28/20 1020 08/29/20 0457  WBC 9.0 16.8*  16.8*  NEUTROABS 6.5 15.1*  HGB 14.5 11.3*  11.2*  HCT 46.5 34.7*  33.9*  MCV 102.4* 99.1  98.5  PLT 281 235  196   Basic Metabolic Panel: Recent Labs  Lab 08/28/20 1020 08/29/20 0457  NA 137 140  K 4.5 3.9  CL 105 110  CO2 20* 23  GLUCOSE 108* 101*  BUN 19 21  CREATININE 1.39* 1.50*  CALCIUM 9.6 8.6*  MG 2.2  --    GFR: Estimated Creatinine Clearance: 48.6 mL/min (A) (by C-G formula based on SCr of 1.5 mg/dL (H)). Liver Function Tests: Recent Labs  Lab 08/28/20 1020  AST 30  ALT 28  ALKPHOS 72  BILITOT 0.8  PROT 7.2  ALBUMIN 3.5   No results for input(s): LIPASE, AMYLASE in the last 168 hours. No results for input(s): AMMONIA in the last 168 hours. Coagulation Profile: Recent Labs  Lab 08/29/20 0457  INR 1.4*    Cardiac Enzymes: No results for input(s): CKTOTAL, CKMB, CKMBINDEX, TROPONINI in the last 168 hours. BNP (last 3 results) No results for input(s): PROBNP in the last 8760 hours. HbA1C: No results for input(s): HGBA1C in the last 72 hours. CBG: Recent Labs  Lab 08/29/20 1531 08/29/20 1837 08/29/20 2037 08/30/20 0040 08/30/20 0433  GLUCAP 73 93 96 104* 100*   Lipid Profile: No results for input(s): CHOL, HDL, LDLCALC, TRIG, CHOLHDL, LDLDIRECT in the last 72 hours. Thyroid Function Tests: No results for input(s): TSH, T4TOTAL, FREET4, T3FREE, THYROIDAB in the last 72 hours. Anemia Panel: No results for input(s): VITAMINB12, FOLATE, FERRITIN, TIBC, IRON, RETICCTPCT in the last 72 hours. Sepsis Labs: Recent Labs  Lab 08/28/20 1020  LATICACIDVEN 1.8    Recent Results (from the past 240 hour(s))  Microscopic Examination     Status: Abnormal   Collection Time: 08/20/20 11:01 AM   Urine  Result Value Ref Range Status   WBC, UA 0-5 0 - 5 /hpf Final  RBC >30 (A) 0 - 2 /hpf Final   Epithelial Cells (non renal) 0-10 0 - 10 /hpf Final   Casts Present (A) None seen /lpf Final   Cast Type Hyaline casts N/A Final   Crystals Present (A) N/A Final   Crystal Type Amorphous Sediment N/A Final   Bacteria, UA None seen None seen/Few Final  Urine Culture     Status: Abnormal   Collection Time: 08/22/20 11:36 AM   Specimen: Urine, Clean Catch  Result Value Ref Range Status   Specimen Description   Final    URINE, CLEAN CATCH Performed at [facility], [address] [place], [place] [address]    Special Requests   Final    NONE Performed at [facility], [address] [place], [place] [address]    Culture >=100,000 COLONIES/mL [facility] MORGANII (A)  Final   Report Status 08/24/2020 FINAL  Final   Organism ID, Bacteria [name] MORGANII (A)  Final      Susceptibility   [name] morganii - MIC*    AMPICILLIN >=32 RESISTANT Resistant     CEFAZOLIN  >=64 RESISTANT Resistant     CIPROFLOXACIN <=0.25 SENSITIVE Sensitive     GENTAMICIN <=1 SENSITIVE Sensitive     IMIPENEM 2 SENSITIVE Sensitive     NITROFURANTOIN RESISTANT Resistant     TRIMETH/SULFA <=20 SENSITIVE Sensitive     AMPICILLIN/SULBACTAM >=32 RESISTANT Resistant     PIP/TAZO <=4 SENSITIVE Sensitive     * >=100,000 COLONIES/mL [name] MORGANII  Culture, blood (Routine x 2)     Status: None (Preliminary result)   Collection Time: 08/28/20 10:20 AM   Specimen: Right Antecubital; Blood  Result Value Ref Range Status   Specimen Description RIGHT ANTECUBITAL  Final   Special Requests   Final    BOTTLES DRAWN AEROBIC AND ANAEROBIC Blood Culture results may not be optimal due to an inadequate volume of blood received in culture bottles   Culture   Final    NO GROWTH < 24 HOURS Performed at [facility], [address] [place], [place] [address]    Report Status PENDING  Incomplete  Resp Panel by RT-PCR (Flu A&B, Covid) Nasopharyngeal Swab     Status: None   Collection Time: 08/29/20  4:19 AM   Specimen: Nasopharyngeal Swab; Nasopharyngeal(NP) swabs in vial transport medium  Result Value Ref Range Status   SARS Coronavirus 2 by RT PCR NEGATIVE NEGATIVE Final    Comment: (NOTE) SARS-CoV-2 target nucleic acids are NOT DETECTED.  The SARS-CoV-2 RNA is generally detectable in upper respiratory specimens during the acute phase of infection. The lowest concentration of SARS-CoV-2 viral copies this assay can detect is 138 copies/mL. A negative result does not preclude SARS-Cov-2 infection and should not be used as the sole basis for treatment or other patient management decisions. A negative result may occur with  improper specimen collection/handling, submission of specimen other than nasopharyngeal swab, presence of viral mutation(s) within the areas targeted by this assay, and inadequate number of viral copies(<138 copies/mL). A negative result must be combined  with clinical observations, patient history, and epidemiological information. The expected result is Negative.  Fact Sheet for Patients:  EntrepreneurPulse.com.au  Fact Sheet for Healthcare Providers:  IncredibleEmployment.be  This test is no t yet approved or cleared by the [place] FDA and  has been authorized for detection and/or diagnosis of SARS-CoV-2 by FDA under an Emergency Use Authorization (EUA). This EUA will remain  in effect (meaning this test  can be used) for the duration of the COVID-19 declaration under Section 564(b)(1) of the Act, 21 U.S.C.section 360bbb-3(b)(1), unless the authorization is terminated  or revoked sooner.       Influenza A by PCR NEGATIVE NEGATIVE Final   Influenza B by PCR NEGATIVE NEGATIVE Final    Comment: (NOTE) The Xpert Xpress SARS-CoV-2/FLU/RSV plus assay is intended as an aid in the diagnosis of influenza from Nasopharyngeal swab specimens and should not be used as a sole basis for treatment. Nasal washings and aspirates are unacceptable for Xpert Xpress SARS-CoV-2/FLU/RSV testing.  Fact Sheet for Patients: EntrepreneurPulse.com.au  Fact Sheet for Healthcare Providers: IncredibleEmployment.be  This test is not yet approved or cleared by the [place] FDA and has been authorized for detection and/or diagnosis of SARS-CoV-2 by FDA under an Emergency Use Authorization (EUA). This EUA will remain in effect (meaning this test can be used) for the duration of the COVID-19 declaration under Section 564(b)(1) of the Act, 21 U.S.C. section 360bbb-3(b)(1), unless the authorization is terminated or revoked.  Performed at [facility], [address] [place],  [address]          Radiology Studies: CT ABDOMEN PELVIS W CONTRAST  Result Date: 08/28/2020 CLINICAL DATA:  Abdominal pain with nausea and diarrhea. EXAM: CT ABDOMEN AND PELVIS  WITH CONTRAST TECHNIQUE: Multidetector CT imaging of the abdomen and pelvis was performed using the standard protocol following bolus administration of intravenous contrast. CONTRAST:  156mL OMNIPAQUE IOHEXOL 300 MG/ML  SOLN COMPARISON:  CT abdomen pelvis dated August 05, 2020. FINDINGS: Lower chest: No acute abnormality.  Chronic cardiomegaly. Hepatobiliary: No focal liver abnormality. Unchanged punctate calcified granuloma in the right liver. Prior cholecystectomy. No biliary dilatation. Unchanged pneumobilia. Pancreas: Unremarkable. No pancreatic ductal dilatation or surrounding inflammatory changes. Spleen: Normal in size without focal abnormality. Adrenals/Urinary Tract: The adrenal glands are unremarkable. Unchanged bilateral renal simple cysts. Unchanged 3 mm calculus in the upper pole and punctate calculus in the lower pole of the left kidney. No hydronephrosis. The bladder is decompressed by a Foley catheter. Stomach/Bowel: Acute diverticulitis of the proximal to mid sigmoid colon with wall thickening and prominent surrounding inflammatory changes. There is slightly organized fluid with surrounding inflammatory stranding in the mesentery anterior and superior to the sigmoid colon (series 2, image 74). There are a multiple small foci of free air. The remaining colon is decompressed. The cecum is centrally located within the abdomen. Normal appendix. Small hiatal hernia. The stomach is otherwise within normal limits. No obstruction. Vascular/Lymphatic: Aortic atherosclerosis. No enlarged abdominal or pelvic lymph nodes. Reproductive: Chronic severe prostatomegaly. Other: Trace perihepatic, mesenteric, and pelvic ascites. Small volume pneumoperitoneum. Musculoskeletal: No acute or significant osseous findings. IMPRESSION: 1. Acute perforated sigmoid diverticulitis with small volume pneumoperitoneum and ascites. No drainable abscess. 2. Unchanged nonobstructive left nephrolithiasis. 3. Chronic severe  prostatomegaly. 4. Aortic Atherosclerosis (ICD10-I70.0). Critical Value/emergent results were called by telephone at the time of interpretation on 08/28/2020 at 3:07 pm to provider [facility] , who verbally acknowledged these results. Electronically Signed   By: Titus Dubin M.D.   On: 08/28/2020 15:07   DG Chest Port 1 View  Result Date: 08/28/2020 CLINICAL DATA:  Possible sepsis. EXAM: PORTABLE CHEST 1 VIEW COMPARISON:  Chest x-ray dated January 14, 2019. FINDINGS: The heart size and mediastinal contours are within normal limits. Prior CABG. Normal pulmonary vascularity. No focal consolidation, pleural effusion, or pneumothorax. No acute osseous abnormality. IMPRESSION: No active disease. Electronically Signed   By: Titus Dubin  M.D.   On: 08/28/2020 11:32   DG ABD ACUTE 2+V W 1V CHEST  Result Date: 08/29/2020 CLINICAL DATA:  Perforated sigmoid diverticulitis EXAM: DG ABDOMEN ACUTE WITH 1 VIEW CHEST COMPARISON:  08/28/2020 CT abdomen/pelvis FINDINGS: Intact sternotomy wires. Stable cardiomediastinal silhouette with top-normal heart size. No pneumothorax. No pleural effusion. No pulmonary edema. Mild left lung base scarring versus atelectasis. A few dilated small bowel loops scattered in the abdomen bilaterally up to 4.3 cm diameter, new from prior CT. No significant air-fluid levels. No appreciable pneumatosis or pneumoperitoneum. Cholecystectomy clips are seen in the right upper quadrant of the abdomen. Clips overlie the prostate. Moderate lumbar spondylosis. Left mid renal 4 mm stone. IMPRESSION: 1. Mild left lung base scarring versus atelectasis. Otherwise no active cardiopulmonary disease. 2. New mildly dilated small bowel loops scattered in the abdomen bilaterally, favor mild adynamic ileus in the setting of acute diverticulitis. No appreciable pneumoperitoneum. 3. Left mid renal 4 mm stone. Electronically Signed   By: Ilona Sorrel M.D.   On: 08/29/2020 09:50        Scheduled Meds: .  aspirin EC  81 mg Oral Daily  . Chlorhexidine Gluconate Cloth  6 each Topical Daily  . dorzolamide-timolol  1 drop Both Eyes BID  . dutasteride  0.5 mg Oral QODAY  . heparin  5,000 Units Subcutaneous Q8H  . ketotifen  1 drop Both Eyes BID  . latanoprost  1 drop Both Eyes QHS  . lidocaine  1 patch Transdermal Q24H  . mirabegron ER  25 mg Oral Daily  . pravastatin  20 mg Oral Daily  . tamsulosin  0.4 mg Oral Daily   Continuous Infusions: . sodium chloride 250 mL (08/30/20 0536)  . dextrose 5 % and 0.9 % NaCl with KCl 20 mEq/L 100 mL/hr at 08/30/20 0131  . piperacillin-tazobactam (ZOSYN)  IV 3.375 g (08/30/20 0536)     LOS: 1 day    Time spent: 25 minutes    Sidney Ace, MD [facility] Pager [phone]  If 7PM-7AM, please contact night-coverage 08/30/2020, 6:20 AM

## 2020-08-30 NOTE — Progress Notes (Signed)
   08/30/20 1000  Clinical Encounter Type  Visited With Patient  Visit Type Initial  Referral From Physician  Consult/Referral To Chaplain   I visited with Mr. Bradley [name] regarding an AD. He has an AD on file.   [place], [place]

## 2020-08-31 ENCOUNTER — Inpatient Hospital Stay: Payer: Medicare Other

## 2020-08-31 LAB — GLUCOSE, CAPILLARY
Glucose-Capillary: 88 mg/dL (ref 70–99)
Glucose-Capillary: 90 mg/dL (ref 70–99)
Glucose-Capillary: 109 mg/dL — ABNORMAL HIGH (ref 70–99)
Glucose-Capillary: 96 mg/dL (ref 70–99)
Glucose-Capillary: 106 mg/dL — ABNORMAL HIGH (ref 70–99)

## 2020-08-31 LAB — COMPREHENSIVE METABOLIC PANEL
ALT: 25 U/L (ref 0–44)
AST: 25 U/L (ref 15–41)
Albumin: 3 g/dL — ABNORMAL LOW (ref 3.5–5.0)
Alkaline Phosphatase: 50 U/L (ref 38–126)
Anion gap: 5 (ref 5–15)
BUN: 14 mg/dL (ref 8–23)
CO2: 21 mmol/L — ABNORMAL LOW (ref 22–32)
Calcium: 9 mg/dL (ref 8.9–10.3)
Chloride: 110 mmol/L (ref 98–111)
Creatinine, Ser: 1.34 mg/dL — ABNORMAL HIGH (ref 0.61–1.24)
GFR, Estimated: 54 mL/min — ABNORMAL LOW (ref >60)
Glucose, Bld: 96 mg/dL (ref 70–99)
Potassium: 4 mmol/L (ref 3.5–5.1)
Sodium: 136 mmol/L (ref 135–145)
Total Bilirubin: 0.6 mg/dL (ref 0.3–1.2)
Total Protein: 6.2 g/dL — ABNORMAL LOW (ref 6.5–8.1)

## 2020-08-31 LAB — CBC WITH DIFFERENTIAL/PLATELET
Abs Immature Granulocytes: 0.11 10*3/uL — ABNORMAL HIGH (ref 0.00–0.07)
Basophils Absolute: 0.1 10*3/uL (ref 0.0–0.1)
Basophils Relative: 1 %
Eosinophils Absolute: 0.4 10*3/uL (ref 0.0–0.5)
Eosinophils Relative: 4 %
HCT: 35.2 % — ABNORMAL LOW (ref 39.0–52.0)
Hemoglobin: 11.8 g/dL — ABNORMAL LOW (ref 13.0–17.0)
Immature Granulocytes: 1 %
Lymphocytes Relative: 9 %
Lymphs Abs: 1 10*3/uL (ref 0.7–4.0)
MCH: 32.6 pg (ref 26.0–34.0)
MCHC: 33.5 g/dL (ref 30.0–36.0)
MCV: 97.2 fL (ref 80.0–100.0)
Monocytes Absolute: 0.5 10*3/uL (ref 0.1–1.0)
Monocytes Relative: 5 %
Neutro Abs: 8.3 10*3/uL — ABNORMAL HIGH (ref 1.7–7.7)
Neutrophils Relative %: 80 %
Platelets: 264 10*3/uL (ref 150–400)
RBC: 3.62 MIL/uL — ABNORMAL LOW (ref 4.22–5.81)
RDW: 13 % (ref 11.5–15.5)
WBC: 10.3 10*3/uL (ref 4.0–10.5)
nRBC: 0 % (ref 0.0–0.2)

## 2020-08-31 MED ORDER — NYSTATIN 100000 UNIT/GM EX POWD
Freq: Two times a day (BID) | CUTANEOUS | Status: DC
  Filled 2020-08-31: qty 15

## 2020-08-31 MED ORDER — METHOCARBAMOL 500 MG PO TABS: 750.0000 mg | ORAL_TABLET | Freq: Four times a day (QID) | ORAL | Status: DC | PRN

## 2020-08-31 MED ORDER — IOHEXOL 300 MG/ML  SOLN
100.0000 mL | Freq: Once | INTRAMUSCULAR | Status: AC | PRN
  Administered 2020-08-31: 100 mL via INTRAVENOUS

## 2020-08-31 NOTE — Progress Notes (Signed)
Irrigated the foley once this afternoon and pulled out a medium sized blood clot.  Foley started draining after that

## 2020-08-31 NOTE — Progress Notes (Addendum)
[facility] SURGICAL PROGRESS NOTE (cpt [phone])  Hospital Day(s): 2.   Interval History: Patient seen and examined, no acute events or new complaints overnight. Patient reports he is feeling better this morning. He denied any abdominal pain, nausea, emesis, fever, chills. Previously seen leukocytosis has resolved, WBC now 10.3K this morning. Renal function remains around his baseline with sCr - 1.34. UO is unmeasured. He was started on CLD yesterday and tolerated well. Plan for repeat imaging this morning. Continues on Zosyn.  Review of Systems:  Constitutional: denies fever, chills  HEENT: denies cough or congestion  Respiratory: denies any shortness of breath  Cardiovascular: denies chest pain or palpitations  Gastrointestinal: denies abdominal pain, N/V, or diarrhea/and bowel function as per interval history Genitourinary: denies burning with urination or urinary frequency    Vital signs in last 24 hours: [min-max] current  Temp:  [97.8 F (36.6 C)-98.7 F (37.1 C)] 97.8 F (36.6 C) (02/15 0425) Pulse Rate:  [47-62] 60 (02/15 0425) Resp:  [17-20] 18 (02/15 0425) BP: (114-145)/(59-74) 135/59 (02/15 0425) SpO2:  [96 %-99 %] 98 % (02/15 0425)     Height: 6' (182.9 cm) Weight: 102.1 kg BMI (Calculated): 30.51   Intake/Output last 2 shifts:  02/14 0701 - 02/15 0700 In: 600 [P.O.:600] Out: -    Physical Exam:  Constitutional: alert, cooperative and no distress  HENT: normocephalic without obvious abnormality  Eyes: PERRL, EOM's grossly intact and symmetric  Respiratory: breathing non-labored at rest  Cardiovascular: regular rate and sinus rhythm  Gastrointestinal: Soft, no appreciable tenderness this morning, non-distended, no rebound/guarding Genitourinary: Foley in place, he does appear to have some hematuria present  Labs:  CBC Latest Ref Rng & Units 08/31/2020 08/30/2020 08/29/2020  WBC 4.0 - 10.5 K/uL 10.3 11.5(H) 16.8(H)  Hemoglobin 13.0 - 17.0 g/dL 11.8(L)  10.7(L) 11.3(L)  Hematocrit 39.0 - 52.0 % 35.2(L) 33.2(L) 34.7(L)  Platelets 150 - 400 K/uL 264 260 235   CMP Latest Ref Rng & Units 08/31/2020 08/30/2020 08/29/2020  Glucose 70 - 99 mg/dL 96 113(H) 101(H)  BUN 8 - 23 mg/dL 14 20 21   Creatinine 0.61 - 1.24 mg/dL 1.34(H) 1.54(H) 1.50(H)  Sodium 135 - 145 mmol/L 136 141 140  Potassium 3.5 - 5.1 mmol/L 4.0 4.0 3.9  Chloride 98 - 111 mmol/L 110 110 110  CO2 22 - 32 mmol/L 21(L) 23 23  Calcium 8.9 - 10.3 mg/dL 9.0 8.9 8.6(L)  Total Protein 6.5 - 8.1 g/dL 6.2(L) 5.7(L) -  Total Bilirubin 0.3 - 1.2 mg/dL 0.6 0.6 -  Alkaline Phos 38 - 126 U/L 50 46 -  AST 15 - 41 U/L 25 22 -  ALT 0 - 44 U/L 25 20 -     Imaging studies:   CT Abdomen/Pelvis (08/31/2020) personally reviewed with lesser degree of pneumoperitoneum still with inflammatory changes surrounding the sigmoid colon with small contained extra-luminal air and fluid, and radiologist report reviewed:  IMPRESSION: 1. Sigmoid diverticulosis with redemonstrated fat stranding and air and fluid loculations anterior to the mid sigmoid. These are not significantly changed compared to prior examination the largest collection measures 2.5 x 1.7 cm. Findings are consistent with diverticulitis complicated by perforation and abscess. 2. Foley catheter within the bladder; the urinary bladder wall is very thickened with adjacent fat stranding. This likely reflects a combination of chronic outlet obstruction and reactive inflammation to adjacent diverticulitis. 3. Prostatomegaly. Urolift implants. 4. Nonobstructive left nephrolithiasis.  No hydronephrosis. 5. Small bilateral pleural effusions and associated atelectasis or consolidation. 6.  Coronary artery disease.    Assessment/Plan: (ICD-10's: K58.20) 80 y.o. male with now resolved leukocytosis and improved abdominal pain admitted with perforated diverticulitis   - Reviewed CT imaging, questionable abscess is small and I am not sure this would be  amenable to percutaneous drainage given location. Additionally, he continues to make clinical improvements and has had resolution in his leukocytosis.   - I think it is reasonable to trial on full liquids today  - Continue IV Resuscitation; wean as diet advances             - Continue IV ABx (Zosyn)  - No emergent surgical intervention currently. He understands that should he clinically deteriorate, then we would need to pursue more urgent interventions and likely temporizing colostomy.    - Monitor abdominal examination; on-going bowel function             - Pain control prn; antiemetics prn   - Mobilization encouraged             - Further management per primary service; we will follow    All of the above findings and recommendations were discussed with the patient, and the medical team, and all of patient's questions were answered to his expressed satisfaction.  -- Edison Simon, PA-C [facility] 08/31/2020, 7:18 AM [phone] M-F: 7am - 4pm  I saw and evaluated the patient.  I agree with the above documentation, exam, and plan, which I have edited where appropriate. Fredirick Maudlin  12:30 PM

## 2020-08-31 NOTE — Progress Notes (Signed)
PROGRESS NOTE    Bradley [name]  GMW:[id number] DOB: 1940-12-19 DOA: 08/28/2020 PCP: Sofie Hartigan, MD  Brief Narrative:  80 y.o. male with medical history significant for history of prostate cancer, BPH with urinary retention, recent urinary tract infection, hyperlipidemia, acid reflux, chronic bilateral low back pain with right-sided sciatica, foraminal stenosis of the lumbar region, presented to the emergency department for chief concerns of abdominal pain and urinary retention.  He reports he has felt weak with periumbilical ache for about 1-2 weeks. He endorses nausea and denies vomiting. He endorses one episode of watery diarrhea last week and had one episode of diarrhea in the ED on day of presentation. He denies chest pain and shortness of breath. On physical exam, he has tenderness of the left lower quadrant with palpation.  CT imaging survey revealed diverticulitis with possible small contained perforation.  General surgery was involved with recommendations for n.p.o. status and serial abdominal exams.   2/14: None patient seen and examined.  Hemodynamically stable.  Abdominal exam reassuring.  Discussed with Dr. Dahlia Byes general surgery.  Currently no plans for emergent surgical intervention during this admission.  We will continue with conservative management, serial abdominal exams, slow diet advancement.  Patient had acute onset hematuria on 08/29/2020.  Given recent bladder instrumentation will involve urology service.  2/15: Abdominal exam reassuring.  CT abdomen demonstrates persistent diverticulitis with small contained perforation.  Seen by surgery, no plans for surgical intervention.  Trial full liquids   Assessment & Plan:   Active Problems:   Benign essential HTN   CA in situ prostate   S/P CABG x 4   GERD (gastroesophageal reflux disease)   Mixed hyperlipidemia   Bradycardia   Diverticulitis of intestine with perforation   Perforated diverticulum  Hematuria Acute  onset, 08/29/2020 Likely related to marked prostate enlargement and recent bladder is mentation Painless hematuria Urology consulted, flushed Foley catheter at bedside Hematuria resolved Hemoglobin stable Plan: Hold SQ heparin Continue Foley catheter Daily CBC At time of discharge would suggest discharging with catheter in place.  Can touch base with urology for recommendations  Acute perforated sigmoid diverticulitis Patient endorses weakness and pain he has never felt before CT imaging survey revealed the above General surgery involved Serial abdominal exams reassuring Currently no plans for surgical intervention General surgery continues to follow Plan: Continue IV Zosyn Continue fluid resuscitation Trial full liquids Pain control as needed Antiemetics as needed Serial abdominal exams   Recent bladder instrumentation [name] UTI Patient was previously on ciprofloxacin for UTI associated with the recent cystoscopy Hematuria noted 08/29/2020 We will maintain Foley catheter in place IV Zosyn as above  Hyperlipidemia Statin  BPH Finasteride Mirabegron Tamsulosin  History of coronary disease status post CABG 2016 LIMA to LAD, saphenous to OM 1 No acute issues Outpatient follow-up    DVT prophylaxis: SCDs Code Status: DNR Family Communication: None today.  Offered to call but patient declined Disposition Plan: Status is: Inpatient  Remains inpatient appropriate because:Inpatient level of care appropriate due to severity of illness   Dispo: The patient is from: Home              Anticipated d/c is to: Home              Anticipated d/c date is: 2 days              Patient currently is not medically stable to d/c.   Difficult to place patient No   Projected discharge back home.  Functional status is good.  Advancing diet to full liquids today.  Do not suspect the patient will need surgical intervention however surgical consultation service continues to  follow.  Possible discharge in 2 days if continues to improve.    Level of care: Med-Surg  Consultants:   General surgery  Procedures:   None  Antimicrobials:   Zosyn   Subjective: Patient seen and examined.  Hematuria resolved.  Sitting up in chair.  Mild back pain otherwise asymptomatic.  Objective: Vitals:   08/30/20 2344 08/31/20 0425 08/31/20 0901 08/31/20 1144  BP: (!) 145/72 (!) 135/59 127/70 (!) 104/49  Pulse: 62 60 66 (!) 52  Resp: 20 18 18 18   Temp: 98.7 F (37.1 C) 97.8 F (36.6 C) 97.6 F (36.4 C) 97.8 F (36.6 C)  TempSrc: Oral     SpO2: 97% 98% 97% 100%  Weight:      Height:        Intake/Output Summary (Last 24 hours) at 08/31/2020 1240 Last data filed at 08/31/2020 1019 Gross per 24 hour  Intake 840 ml  Output 1601 ml  Net -761 ml   Filed Weights   08/28/20 0959  Weight: 102.1 kg    Examination:  General exam: Appears calm and comfortable  Respiratory system: Clear to auscultation. Respiratory effort normal. Cardiovascular system: S1 & S2 heard, RRR. No JVD, murmurs, rubs, gallops or clicks. No pedal edema. Gastrointestinal system: Obese, mild tender to palpation lower abdomen.  Normal bowel sounds  Genitourinary system: Foley catheter in place, hematuria resolved Central nervous system: Alert and oriented. No focal neurological deficits. Extremities: Symmetric 5 x 5 power. Skin: No rashes, lesions or ulcers Psychiatry: Judgement and insight appear normal. Mood & affect appropriate.     Data Reviewed: I have personally reviewed following labs and imaging studies  CBC: Recent Labs  Lab 08/28/20 1020 08/29/20 0457 08/30/20 0533 08/31/20 0425  WBC 9.0 16.8*   16.8* 11.5* 10.3  NEUTROABS 6.5 15.1* 9.7* 8.3*  HGB 14.5 11.3*   11.2* 10.7* 11.8*  HCT 46.5 34.7*   33.9* 33.2* 35.2*  MCV 102.4* 99.1   98.5 98.5 97.2  PLT 281 235   237 260 765   Basic Metabolic Panel: Recent Labs  Lab 08/28/20 1020 08/29/20 0457 08/30/20 0533  08/31/20 0425  NA 137 140 141 136  K 4.5 3.9 4.0 4.0  CL 105 110 110 110  CO2 20* 23 23 21*  GLUCOSE 108* 101* 113* 96  BUN 19 21 20 14   CREATININE 1.39* 1.50* 1.54* 1.34*  CALCIUM 9.6 8.6* 8.9 9.0  MG 2.2  --   --   --    GFR: Estimated Creatinine Clearance: 54.4 mL/min (A) (by C-G formula based on SCr of 1.34 mg/dL (H)). Liver Function Tests: Recent Labs  Lab 08/28/20 1020 08/30/20 0533 08/31/20 0425  AST 30 22 25   ALT 28 20 25   ALKPHOS 72 46 50  BILITOT 0.8 0.6 0.6  PROT 7.2 5.7* 6.2*  ALBUMIN 3.5 2.9* 3.0*   No results for input(s): LIPASE, AMYLASE in the last 168 hours. No results for input(s): AMMONIA in the last 168 hours. Coagulation Profile: Recent Labs  Lab 08/29/20 0457  INR 1.4*   Cardiac Enzymes: No results for input(s): CKTOTAL, CKMB, CKMBINDEX, TROPONINI in the last 168 hours. BNP (last 3 results) No results for input(s): PROBNP in the last 8760 hours. HbA1C: No results for input(s): HGBA1C in the last 72 hours. CBG: Recent Labs  Lab 08/30/20 1927 08/30/20  2346 08/31/20 0422 08/31/20 0735 08/31/20 1141  GLUCAP 122* 109* 88 90 109*   Lipid Profile: No results for input(s): CHOL, HDL, LDLCALC, TRIG, CHOLHDL, LDLDIRECT in the last 72 hours. Thyroid Function Tests: No results for input(s): TSH, T4TOTAL, FREET4, T3FREE, THYROIDAB in the last 72 hours. Anemia Panel: No results for input(s): VITAMINB12, FOLATE, FERRITIN, TIBC, IRON, RETICCTPCT in the last 72 hours. Sepsis Labs: Recent Labs  Lab 08/28/20 1020  LATICACIDVEN 1.8    Recent Results (from the past 240 hour(s))  Urine Culture     Status: Abnormal   Collection Time: 08/22/20 11:36 AM   Specimen: Urine, Clean Catch  Result Value Ref Range Status   Specimen Description   Final    URINE, CLEAN CATCH Performed at [facility], [address], [place], [place] [address]    Special Requests   Final    NONE Performed at [facility], [address]  [place], [place] [address]    Culture >=100,000 COLONIES/mL [facility] MORGANII (A)  Final   Report Status 08/24/2020 FINAL  Final   Organism ID, Bacteria [name] MORGANII (A)  Final      Susceptibility   [name] morganii - MIC*    AMPICILLIN >=32 RESISTANT Resistant     CEFAZOLIN >=64 RESISTANT Resistant     CIPROFLOXACIN <=0.25 SENSITIVE Sensitive     GENTAMICIN <=1 SENSITIVE Sensitive     IMIPENEM 2 SENSITIVE Sensitive     NITROFURANTOIN RESISTANT Resistant     TRIMETH/SULFA <=20 SENSITIVE Sensitive     AMPICILLIN/SULBACTAM >=32 RESISTANT Resistant     PIP/TAZO <=4 SENSITIVE Sensitive     * >=100,000 COLONIES/mL [name] MORGANII  Culture, blood (Routine x 2)     Status: None (Preliminary result)   Collection Time: 08/28/20 10:20 AM   Specimen: Right Antecubital; Blood  Result Value Ref Range Status   Specimen Description RIGHT ANTECUBITAL  Final   Special Requests   Final    BOTTLES DRAWN AEROBIC AND ANAEROBIC Blood Culture results may not be optimal due to an inadequate volume of blood received in culture bottles   Culture   Final    NO GROWTH 3 DAYS Performed at [facility], [address], [place], [place] [address]    Report Status PENDING  Incomplete  Resp Panel by RT-PCR (Flu A&B, Covid) Nasopharyngeal Swab     Status: None   Collection Time: 08/29/20  4:19 AM   Specimen: Nasopharyngeal Swab; Nasopharyngeal(NP) swabs in vial transport medium  Result Value Ref Range Status   SARS Coronavirus 2 by RT PCR NEGATIVE NEGATIVE Final    Comment: (NOTE) SARS-CoV-2 target nucleic acids are NOT DETECTED.  The SARS-CoV-2 RNA is generally detectable in upper respiratory specimens during the acute phase of infection. The lowest concentration of SARS-CoV-2 viral copies this assay can detect is 138 copies/mL. A negative result does not preclude SARS-Cov-2 infection and should not be used as the sole basis for treatment or other patient management decisions. A negative  result may occur with  improper specimen collection/handling, submission of specimen other than nasopharyngeal swab, presence of viral mutation(s) within the areas targeted by this assay, and inadequate number of viral copies(<138 copies/mL). A negative result must be combined with clinical observations, patient history, and epidemiological information. The expected result is Negative.  Fact Sheet for Patients:  EntrepreneurPulse.com.au  Fact Sheet for Healthcare Providers:  IncredibleEmployment.be  This test is no t yet approved or cleared by the [place] FDA and  has  been authorized for detection and/or diagnosis of SARS-CoV-2 by FDA under an Emergency Use Authorization (EUA). This EUA will remain  in effect (meaning this test can be used) for the duration of the COVID-19 declaration under Section 564(b)(1) of the Act, 21 U.S.C.section 360bbb-3(b)(1), unless the authorization is terminated  or revoked sooner.       Influenza A by PCR NEGATIVE NEGATIVE Final   Influenza B by PCR NEGATIVE NEGATIVE Final    Comment: (NOTE) The Xpert Xpress SARS-CoV-2/FLU/RSV plus assay is intended as an aid in the diagnosis of influenza from Nasopharyngeal swab specimens and should not be used as a sole basis for treatment. Nasal washings and aspirates are unacceptable for Xpert Xpress SARS-CoV-2/FLU/RSV testing.  Fact Sheet for Patients: EntrepreneurPulse.com.au  Fact Sheet for Healthcare Providers: IncredibleEmployment.be  This test is not yet approved or cleared by the [place] FDA and has been authorized for detection and/or diagnosis of SARS-CoV-2 by FDA under an Emergency Use Authorization (EUA). This EUA will remain in effect (meaning this test can be used) for the duration of the COVID-19 declaration under Section 564(b)(1) of the Act, 21 U.S.C. section 360bbb-3(b)(1), unless the authorization is  terminated or revoked.  Performed at [facility], [address] [place], [place] [address]          Radiology Studies: CT ABDOMEN PELVIS W CONTRAST  Result Date: 08/31/2020 CLINICAL DATA:  Perforated diverticulitis, re-evaluate EXAM: CT ABDOMEN AND PELVIS WITH CONTRAST TECHNIQUE: Multidetector CT imaging of the abdomen and pelvis was performed using the standard protocol following bolus administration of intravenous contrast. CONTRAST:  127mL OMNIPAQUE IOHEXOL 300 MG/ML SOLN, additional oral enteric contrast COMPARISON:  08/28/2020 FINDINGS: Lower chest: Small bilateral pleural effusions and associated atelectasis or consolidation. Three-vessel coronary artery calcifications. Hepatobiliary: No focal liver abnormality is seen. Status post cholecystectomy. No biliary dilatation. Postoperative pneumobilia, unchanged. Pancreas: Unremarkable. No pancreatic ductal dilatation or surrounding inflammatory changes. Spleen: Normal in size without significant abnormality. Adrenals/Urinary Tract: Adrenal glands are unremarkable. Multiple bilateral simple renal cysts. Nonobstructive calculus of the inferior pole of the left kidney. No hydronephrosis. Foley catheter within the bladder; the urinary bladder wall is very thickened with adjacent fat stranding (series 6, image 64). Stomach/Bowel: Stomach is within normal limits. Sigmoid diverticulosis with redemonstrated fat stranding and air and fluid loculations anterior to the mid sigmoid (series 2, image 80). These are not significantly changed compared to prior examination the largest collection measures 2.5 x 1.7 cm (series 2, image 83) Vascular/Lymphatic: Aortic atherosclerosis. No enlarged abdominal or pelvic lymph nodes. Reproductive: Prostatomegaly.  Uro lift implants. Other: No abdominal wall hernia or abnormality. No abdominopelvic ascites. Musculoskeletal: No acute or significant osseous findings. IMPRESSION: 1. Sigmoid diverticulosis with  redemonstrated fat stranding and air and fluid loculations anterior to the mid sigmoid. These are not significantly changed compared to prior examination the largest collection measures 2.5 x 1.7 cm. Findings are consistent with diverticulitis complicated by perforation and abscess. 2. Foley catheter within the bladder; the urinary bladder wall is very thickened with adjacent fat stranding. This likely reflects a combination of chronic outlet obstruction and reactive inflammation to adjacent diverticulitis. 3. Prostatomegaly. Urolift implants. 4. Nonobstructive left nephrolithiasis.  No hydronephrosis. 5. Small bilateral pleural effusions and associated atelectasis or consolidation. 6. Coronary artery disease. Aortic Atherosclerosis (ICD10-I70.0). Electronically Signed   By: Eddie Candle M.D.   On: 08/31/2020 09:23        Scheduled Meds:  aspirin EC  81 mg Oral Daily   Chlorhexidine Gluconate  Cloth  6 each Topical Daily   dorzolamide-timolol  1 drop Both Eyes BID   dutasteride  0.5 mg Oral QODAY   feeding supplement  1 Container Oral TID BM   ketotifen  1 drop Both Eyes BID   latanoprost  1 drop Both Eyes QHS   lidocaine  1 patch Transdermal Q24H   mirabegron ER  25 mg Oral Daily   nystatin   Topical BID   pravastatin  20 mg Oral Daily   tamsulosin  0.4 mg Oral Daily   Continuous Infusions:  sodium chloride 250 mL (08/30/20 0536)   dextrose 5 % and 0.9 % NaCl with KCl 20 mEq/L 50 mL/hr at 08/31/20 1208   piperacillin-tazobactam (ZOSYN)  IV 3.375 g (08/31/20 1216)     LOS: 2 days    Time spent: 25 minutes    Sidney Ace, MD [facility] Pager [phone]  If 7PM-7AM, please contact night-coverage 08/31/2020, 12:40 PM

## 2020-08-31 NOTE — Progress Notes (Signed)
Mobility Specialist - Progress Note   08/31/20 1300  Mobility  Activity Ambulated in room  Level of Assistance Independent after set-up  Assistive Device Front wheel walker  Distance Ambulated (ft) 60 ft  Mobility Response Tolerated well  Mobility performed by Mobility specialist  $Mobility charge 1 Mobility    Pre-mobility: 52 HR, 93% SpO2 During mobility: 70 HR, 88% SpO2 Post-mobility: 65 HR, 96% SpO2   Pt was sleeping in bed upon arrival utilizing room air. Pt agreed to session. Pt initially denied pain, nausea, and fatigue. Pt able to complete all transfers this date with independency / supervision, including ambulation. Pt sat up EOB with no c/o dizziness. Pt stood to RW and began to ambulate 60' in room with assistance only for cord management. No LOB noted. O2 desat to 88% with activity, but Pt denied SOB throughout session. Pt returned to supine with supervision. Prior to exit, pt c/o pain in L hip area and back "6/10". Nurse notified. Overall, pt tolerated session well. Pt was left in bed with all needs in reach and alarm set.    Kathee Delton Mobility Specialist 08/31/20, 2:03 PM

## 2020-08-31 NOTE — Telephone Encounter (Signed)
Noted  

## 2020-08-31 NOTE — Addendum Note (Signed)
Addended by: Abbie Sons on: 08/31/2020 03:40 PM   Modules accepted: Orders

## 2020-09-01 ENCOUNTER — Encounter: Payer: Self-pay | Admitting: Internal Medicine

## 2020-09-01 DIAGNOSIS — K5720 Diverticulitis of large intestine with perforation and abscess without bleeding: Principal | ICD-10-CM

## 2020-09-01 LAB — COMPREHENSIVE METABOLIC PANEL
ALT: 27 U/L (ref 0–44)
AST: 31 U/L (ref 15–41)
Albumin: 2.9 g/dL — ABNORMAL LOW (ref 3.5–5.0)
Alkaline Phosphatase: 48 U/L (ref 38–126)
Anion gap: 4 — ABNORMAL LOW (ref 5–15)
BUN: 10 mg/dL (ref 8–23)
CO2: 23 mmol/L (ref 22–32)
Calcium: 9 mg/dL (ref 8.9–10.3)
Chloride: 110 mmol/L (ref 98–111)
Creatinine, Ser: 1.38 mg/dL — ABNORMAL HIGH (ref 0.61–1.24)
GFR, Estimated: 52 mL/min — ABNORMAL LOW (ref >60)
Glucose, Bld: 98 mg/dL (ref 70–99)
Potassium: 3.9 mmol/L (ref 3.5–5.1)
Sodium: 137 mmol/L (ref 135–145)
Total Bilirubin: 0.6 mg/dL (ref 0.3–1.2)
Total Protein: 6 g/dL — ABNORMAL LOW (ref 6.5–8.1)

## 2020-09-01 LAB — GLUCOSE, CAPILLARY
Glucose-Capillary: 106 mg/dL — ABNORMAL HIGH (ref 70–99)
Glucose-Capillary: 108 mg/dL — ABNORMAL HIGH (ref 70–99)
Glucose-Capillary: 116 mg/dL — ABNORMAL HIGH (ref 70–99)
Glucose-Capillary: 75 mg/dL (ref 70–99)
Glucose-Capillary: 99 mg/dL (ref 70–99)

## 2020-09-01 LAB — CBC WITH DIFFERENTIAL/PLATELET
Abs Immature Granulocytes: 0.13 10*3/uL — ABNORMAL HIGH (ref 0.00–0.07)
Basophils Absolute: 0 10*3/uL (ref 0.0–0.1)
Basophils Relative: 0 %
Eosinophils Absolute: 0.6 10*3/uL — ABNORMAL HIGH (ref 0.0–0.5)
Eosinophils Relative: 6 %
HCT: 36.6 % — ABNORMAL LOW (ref 39.0–52.0)
Hemoglobin: 12.4 g/dL — ABNORMAL LOW (ref 13.0–17.0)
Immature Granulocytes: 1 %
Lymphocytes Relative: 10 %
Lymphs Abs: 1 10*3/uL (ref 0.7–4.0)
MCH: 32.8 pg (ref 26.0–34.0)
MCHC: 33.9 g/dL (ref 30.0–36.0)
MCV: 96.8 fL (ref 80.0–100.0)
Monocytes Absolute: 0.4 10*3/uL (ref 0.1–1.0)
Monocytes Relative: 5 %
Neutro Abs: 7.6 10*3/uL (ref 1.7–7.7)
Neutrophils Relative %: 78 %
Platelets: 289 10*3/uL (ref 150–400)
RBC: 3.78 MIL/uL — ABNORMAL LOW (ref 4.22–5.81)
RDW: 13 % (ref 11.5–15.5)
WBC: 9.8 10*3/uL (ref 4.0–10.5)
nRBC: 0 % (ref 0.0–0.2)

## 2020-09-01 MED ORDER — AMOXICILLIN-POT CLAVULANATE 875-125 MG PO TABS: 1.0000 | ORAL_TABLET | Freq: Two times a day (BID) | ORAL | 0 refills | Status: AC

## 2020-09-01 NOTE — Progress Notes (Signed)
[facility] SURGICAL PROGRESS NOTE (cpt [phone])  Hospital Day(s): 3.   Interval History: Patient seen and examined, no acute events or new complaints overnight. Patient reports he is feeling better this morning. He denies any abdominal pain, nausea, emesis, fever, chills. His labs remain reassuring, no leukocytosis, renal function baseline. Diet advanced to full liquids yesterday which he has tolerated well. He is having bowel function. Continues on zosyn.   Review of Systems:  Constitutional: denies fever, chills  HEENT: denies cough or congestion  Respiratory: denies any shortness of breath  Cardiovascular: denies chest pain or palpitations  Gastrointestinal: denies abdominal pain, N/V, or diarrhea/and bowel function as per interval history Genitourinary: denies burning with urination or urinary frequency  Vital signs in last 24 hours: [min-max] current  Temp:  [97.6 F (36.4 C)-98 F (36.7 C)] 98 F (36.7 C) (02/16 0447) Pulse Rate:  [52-66] 54 (02/16 0447) Resp:  [16-18] 18 (02/16 0447) BP: (104-149)/(49-82) 131/62 (02/16 0447) SpO2:  [97 %-100 %] 97 % (02/16 0447)     Height: 6' (182.9 cm) Weight: 102.1 kg BMI (Calculated): 30.51   Intake/Output last 2 shifts:  02/15 0701 - 02/16 0700 In: 480 [P.O.:480] Out: 2502 [Urine:2500; Stool:2]   Physical Exam:  Constitutional: alert, cooperative and no distress  HENT: normocephalic without obvious abnormality  Eyes: PERRL, EOM's grossly intact and symmetric  Respiratory: breathing non-labored at rest  Cardiovascular: regular rate and sinus rhythm Gastrointestinal:Soft, no appreciable tenderness this morning, non-distended, no rebound/guarding Genitourinary: Foley in place, he does appear to have some hematuria present   Labs:  CBC Latest Ref Rng & Units 09/01/2020 08/31/2020 08/30/2020  WBC 4.0 - 10.5 K/uL 9.8 10.3 11.5(H)  Hemoglobin 13.0 - 17.0 g/dL 12.4(L) 11.8(L) 10.7(L)  Hematocrit 39.0 - 52.0 % 36.6(L)  35.2(L) 33.2(L)  Platelets 150 - 400 K/uL 289 264 260   CMP Latest Ref Rng & Units 09/01/2020 08/31/2020 08/30/2020  Glucose 70 - 99 mg/dL 98 96 113(H)  BUN 8 - 23 mg/dL 10 14 20   Creatinine 0.61 - 1.24 mg/dL 1.38(H) 1.34(H) 1.54(H)  Sodium 135 - 145 mmol/L 137 136 141  Potassium 3.5 - 5.1 mmol/L 3.9 4.0 4.0  Chloride 98 - 111 mmol/L 110 110 110  CO2 22 - 32 mmol/L 23 21(L) 23  Calcium 8.9 - 10.3 mg/dL 9.0 9.0 8.9  Total Protein 6.5 - 8.1 g/dL 6.0(L) 6.2(L) 5.7(L)  Total Bilirubin 0.3 - 1.2 mg/dL 0.6 0.6 0.6  Alkaline Phos 38 - 126 U/L 48 50 46  AST 15 - 41 U/L 31 25 22   ALT 0 - 44 U/L 27 25 20     Imaging studies: No new pertinent imaging studies   ssessment/Plan: (ICD-10's: K73.20) 80 y.o. male with resolved leukocytosis and abdominal pain admitted with perforated diverticulitis   - Okay to advance to soft diet; Reviewed dietary recommendations for home   - Discontinue IVF - Continue IV ABx (Zosyn); transition to PO for home; likely Augmentin for 11 days to complete 14 days total             - No emergent surgical intervention currently. He understands that should he clinically deteriorate, then we would need to pursue more urgent interventions and likely temporizing colostomy.               - Monitor abdominal examination; on-going bowel function - Pain control prn; antiemetics prn              - Mobilization encouraged - Further management  per primary service   - Discharge Planning: Okay for discharge from surgical standpoint, ABx recommendations as above. He can follow up in 2-3 weeks for reassessment  All of the above findings and recommendations were discussed with the patient, and the medical team, and all of patient's questions were answered to his expressed satisfaction.  -- Edison Simon, PA-C [facility] 09/01/2020, 7:17 AM [phone] M-F: 7am - 4pm

## 2020-09-01 NOTE — Discharge Summary (Addendum)
Physician Discharge Summary  Bradley [name] JGG:[id number] DOB: [birth year] DOA: 08/28/2020  PCP: Sofie Hartigan, MD  Admit date: 08/28/2020 Discharge date: 09/01/2020  Discharge disposition: Home   Recommendations for Outpatient Follow-Up:   Follow-up with general surgeon in 2 to 3 weeks Follow-up with urologist in 1 week   Discharge Diagnosis:   Principal Problem:   Diverticulitis of intestine with perforation Active Problems:   Benign essential HTN   CA in situ prostate   S/P CABG x 4   GERD (gastroesophageal reflux disease)   Mixed hyperlipidemia   Bradycardia   Perforated diverticulum    Discharge Condition: Stable.  Diet recommendation:  Diet Order            DIET SOFT Room service appropriate? Yes; Fluid consistency: Thin  Diet effective now           Diet - low sodium heart healthy                   Code Status: DNR     Hospital Course:   Mr. Bradley [name] is an 80 y.o.malewith medical history significant forhistory of prostate cancer,BPH with urinary retention, recent urinary tract infection treated with ciprofloxacin, hyperlipidemia, acid reflux, chronic bilateral low back pain with right-sided sciatica, foraminal stenosis of the lumbar region, presented to the emergency department for chief concerns of abdominal pain and urinary retention.  He also complained of diarrhea.  He was found to have acute diverticulitis with possible small contained perforation.  General surgeon was consulted and conservative management was recommended.  He was treated with empiric IV antibiotics, analgesics and bowel rest.  He developed acute onset of hematuria.  Urology was consulted.  Foley catheter was placed for management of acute urinary retention.  Urologist recommended that patient be discharged with Foley catheter and follow-up as an outpatient for voiding trial.  His condition has improved.  Abdominal pain has resolved.  He is tolerating a regular diet  without any symptoms.  From general surgeon's standpoint, patient can be discharged home today.    Medical Consultants:    Urologist  General surgeon   Discharge Exam:    Vitals:   08/31/20 2122 09/01/20 0447 09/01/20 0736 09/01/20 1211  BP: (!) 144/82 131/62 (!) 153/86 (!) 142/73  Pulse: 64 (!) 54 (!) 57 68  Resp: 16 18 16    Temp: 98 F (36.7 C) 98 F (36.7 C) 97.6 F (36.4 C) 98 F (36.7 C)  TempSrc: Oral   Oral  SpO2: 97% 97% 97%   Weight:      Height:         GEN: NAD SKIN: No rash EYES: EOMI ENT: MMM CV: RRR PULM: CTA B ABD: soft, ND, NT, +BS CNS: AAO x 3, non focal EXT: No edema or tenderness GU: Foley catheter draining bloody urine   The results of significant diagnostics from this hospitalization (including imaging, microbiology, ancillary and laboratory) are listed below for reference.     Procedures and Diagnostic Studies:   CT ABDOMEN PELVIS W CONTRAST  Result Date: 08/28/2020 CLINICAL DATA:  Abdominal pain with nausea and diarrhea. EXAM: CT ABDOMEN AND PELVIS WITH CONTRAST TECHNIQUE: Multidetector CT imaging of the abdomen and pelvis was performed using the standard protocol following bolus administration of intravenous contrast. CONTRAST:  150mL OMNIPAQUE IOHEXOL 300 MG/ML  SOLN COMPARISON:  CT abdomen pelvis dated August 05, 2020. FINDINGS: Lower chest: No acute abnormality.  Chronic cardiomegaly. Hepatobiliary: No focal liver abnormality. Unchanged punctate calcified  granuloma in the right liver. Prior cholecystectomy. No biliary dilatation. Unchanged pneumobilia. Pancreas: Unremarkable. No pancreatic ductal dilatation or surrounding inflammatory changes. Spleen: Normal in size without focal abnormality. Adrenals/Urinary Tract: The adrenal glands are unremarkable. Unchanged bilateral renal simple cysts. Unchanged 3 mm calculus in the upper pole and punctate calculus in the lower pole of the left kidney. No hydronephrosis. The bladder is decompressed  by a Foley catheter. Stomach/Bowel: Acute diverticulitis of the proximal to mid sigmoid colon with wall thickening and prominent surrounding inflammatory changes. There is slightly organized fluid with surrounding inflammatory stranding in the mesentery anterior and superior to the sigmoid colon (series 2, image 74). There are a multiple small foci of free air. The remaining colon is decompressed. The cecum is centrally located within the abdomen. Normal appendix. Small hiatal hernia. The stomach is otherwise within normal limits. No obstruction. Vascular/Lymphatic: Aortic atherosclerosis. No enlarged abdominal or pelvic lymph nodes. Reproductive: Chronic severe prostatomegaly. Other: Trace perihepatic, mesenteric, and pelvic ascites. Small volume pneumoperitoneum. Musculoskeletal: No acute or significant osseous findings. IMPRESSION: 1. Acute perforated sigmoid diverticulitis with small volume pneumoperitoneum and ascites. No drainable abscess. 2. Unchanged nonobstructive left nephrolithiasis. 3. Chronic severe prostatomegaly. 4. Aortic Atherosclerosis (ICD10-I70.0). Critical Value/emergent results were called by telephone at the time of interpretation on 08/28/2020 at 3:07 pm to provider [facility] , who verbally acknowledged these results. Electronically Signed   By: Titus Dubin M.D.   On: 08/28/2020 15:07   DG Chest Port 1 View  Result Date: 08/28/2020 CLINICAL DATA:  Possible sepsis. EXAM: PORTABLE CHEST 1 VIEW COMPARISON:  Chest x-ray dated January 14, 2019. FINDINGS: The heart size and mediastinal contours are within normal limits. Prior CABG. Normal pulmonary vascularity. No focal consolidation, pleural effusion, or pneumothorax. No acute osseous abnormality. IMPRESSION: No active disease. Electronically Signed   By: Titus Dubin M.D.   On: 08/28/2020 11:32   DG ABD ACUTE 2+V W 1V CHEST  Result Date: 08/29/2020 CLINICAL DATA:  Perforated sigmoid diverticulitis EXAM: DG ABDOMEN ACUTE WITH 1 VIEW  CHEST COMPARISON:  08/28/2020 CT abdomen/pelvis FINDINGS: Intact sternotomy wires. Stable cardiomediastinal silhouette with top-normal heart size. No pneumothorax. No pleural effusion. No pulmonary edema. Mild left lung base scarring versus atelectasis. A few dilated small bowel loops scattered in the abdomen bilaterally up to 4.3 cm diameter, new from prior CT. No significant air-fluid levels. No appreciable pneumatosis or pneumoperitoneum. Cholecystectomy clips are seen in the right upper quadrant of the abdomen. Clips overlie the prostate. Moderate lumbar spondylosis. Left mid renal 4 mm stone. IMPRESSION: 1. Mild left lung base scarring versus atelectasis. Otherwise no active cardiopulmonary disease. 2. New mildly dilated small bowel loops scattered in the abdomen bilaterally, favor mild adynamic ileus in the setting of acute diverticulitis. No appreciable pneumoperitoneum. 3. Left mid renal 4 mm stone. Electronically Signed   By: Ilona Sorrel M.D.   On: 08/29/2020 09:50     Labs:   Basic Metabolic Panel: Recent Labs  Lab 08/28/20 1020 08/29/20 0457 08/30/20 0533 08/31/20 0425 09/01/20 0516  NA 137 140 141 136 137  K 4.5 3.9 4.0 4.0 3.9  CL 105 110 110 110 110  CO2 20* 23 23 21* 23  GLUCOSE 108* 101* 113* 96 98  BUN 19 21 20 14 10   CREATININE 1.39* 1.50* 1.54* 1.34* 1.38*  CALCIUM 9.6 8.6* 8.9 9.0 9.0  MG 2.2  --   --   --   --    GFR Estimated Creatinine Clearance: 52.8 mL/min (A) (by  C-G formula based on SCr of 1.38 mg/dL (H)). Liver Function Tests: Recent Labs  Lab 08/28/20 1020 08/30/20 0533 08/31/20 0425 09/01/20 0516  AST 30 22 25 31   ALT 28 20 25 27   ALKPHOS 72 46 50 48  BILITOT 0.8 0.6 0.6 0.6  PROT 7.2 5.7* 6.2* 6.0*  ALBUMIN 3.5 2.9* 3.0* 2.9*   No results for input(s): LIPASE, AMYLASE in the last 168 hours. No results for input(s): AMMONIA in the last 168 hours. Coagulation profile Recent Labs  Lab 08/29/20 0457  INR 1.4*    CBC: Recent Labs  Lab  08/28/20 1020 08/29/20 0457 08/30/20 0533 08/31/20 0425 09/01/20 0516  WBC 9.0 16.8*  16.8* 11.5* 10.3 9.8  NEUTROABS 6.5 15.1* 9.7* 8.3* 7.6  HGB 14.5 11.3*  11.2* 10.7* 11.8* 12.4*  HCT 46.5 34.7*  33.9* 33.2* 35.2* 36.6*  MCV 102.4* 99.1  98.5 98.5 97.2 96.8  PLT 281 235  237 260 264 289   Cardiac Enzymes: No results for input(s): CKTOTAL, CKMB, CKMBINDEX, TROPONINI in the last 168 hours. BNP: Invalid input(s): POCBNP CBG: Recent Labs  Lab 09/01/20 0031 09/01/20 0449 09/01/20 0754 09/01/20 1138 09/01/20 1622  GLUCAP 99 108* 106* 75 116*   D-Dimer No results for input(s): DDIMER in the last 72 hours. Hgb A1c No results for input(s): HGBA1C in the last 72 hours. Lipid Profile No results for input(s): CHOL, HDL, LDLCALC, TRIG, CHOLHDL, LDLDIRECT in the last 72 hours. Thyroid function studies No results for input(s): TSH, T4TOTAL, T3FREE, THYROIDAB in the last 72 hours.  Invalid input(s): FREET3 Anemia work up No results for input(s): VITAMINB12, FOLATE, FERRITIN, TIBC, IRON, RETICCTPCT in the last 72 hours. Microbiology Recent Results (from the past 240 hour(s))  Culture, blood (Routine x 2)     Status: None (Preliminary result)   Collection Time: 08/28/20 10:20 AM   Specimen: Right Antecubital; Blood  Result Value Ref Range Status   Specimen Description RIGHT ANTECUBITAL  Final   Special Requests   Final    BOTTLES DRAWN AEROBIC AND ANAEROBIC Blood Culture results may not be optimal due to an inadequate volume of blood received in culture bottles   Culture   Final    NO GROWTH 4 DAYS Performed at [facility], [address], [place], [place] [address]    Report Status PENDING  Incomplete  Resp Panel by RT-PCR (Flu A&B, Covid) Nasopharyngeal Swab     Status: None   Collection Time: 08/29/20  4:19 AM   Specimen: Nasopharyngeal Swab; Nasopharyngeal(NP) swabs in vial transport medium  Result Value Ref Range Status   SARS Coronavirus 2 by RT PCR  NEGATIVE NEGATIVE Final    Comment: (NOTE) SARS-CoV-2 target nucleic acids are NOT DETECTED.  The SARS-CoV-2 RNA is generally detectable in upper respiratory specimens during the acute phase of infection. The lowest concentration of SARS-CoV-2 viral copies this assay can detect is 138 copies/mL. A negative result does not preclude SARS-Cov-2 infection and should not be used as the sole basis for treatment or other patient management decisions. A negative result may occur with  improper specimen collection/handling, submission of specimen other than nasopharyngeal swab, presence of viral mutation(s) within the areas targeted by this assay, and inadequate number of viral copies(<138 copies/mL). A negative result must be combined with clinical observations, patient history, and epidemiological information. The expected result is Negative.  Fact Sheet for Patients:  EntrepreneurPulse.com.au  Fact Sheet for Healthcare Providers:  IncredibleEmployment.be  This test is no t yet approved  or cleared by the [place] and  has been authorized for detection and/or diagnosis of SARS-CoV-2 by FDA under an Emergency Use Authorization (EUA). This EUA will remain  in effect (meaning this test can be used) for the duration of the COVID-19 declaration under Section 564(b)(1) of the Act, 21 U.S.C.section 360bbb-3(b)(1), unless the authorization is terminated  or revoked sooner.       Influenza A by PCR NEGATIVE NEGATIVE Final   Influenza B by PCR NEGATIVE NEGATIVE Final    Comment: (NOTE) The Xpert Xpress SARS-CoV-2/FLU/RSV plus assay is intended as an aid in the diagnosis of influenza from Nasopharyngeal swab specimens and should not be used as a sole basis for treatment. Nasal washings and aspirates are unacceptable for Xpert Xpress SARS-CoV-2/FLU/RSV testing.  Fact Sheet for Patients: EntrepreneurPulse.com.au  Fact Sheet for  Healthcare Providers: IncredibleEmployment.be  This test is not yet approved or cleared by the [place] FDA and has been authorized for detection and/or diagnosis of SARS-CoV-2 by FDA under an Emergency Use Authorization (EUA). This EUA will remain in effect (meaning this test can be used) for the duration of the COVID-19 declaration under Section 564(b)(1) of the Act, 21 U.S.C. section 360bbb-3(b)(1), unless the authorization is terminated or revoked.  Performed at [facility], [address] [place], [place] [address]      Discharge Instructions:   Discharge Instructions    Diet - low sodium heart healthy   Complete by: As directed    Increase activity slowly   Complete by: As directed      Allergies as of 09/01/2020      Reactions   Metoprolol Other (See Comments)   Hypotension   Sulfadiazine Other (See Comments)   unknown   Oxycodone Other (See Comments)   Reaction:  Hypotension    Sulfa Antibiotics Rash      Medication List    STOP taking these medications   ciprofloxacin 500 MG tablet Commonly known as: CIPRO   finasteride 5 MG tablet Commonly known as: PROSCAR     TAKE these medications   acetaminophen 500 MG tablet Commonly known as: TYLENOL Take 500 mg by mouth every 6 (six) hours as needed for mild pain or moderate pain.   amoxicillin-clavulanate 875-125 MG tablet Commonly known as: Augmentin Take 1 tablet by mouth 2 (two) times daily for 10 days.   aspirin EC 81 MG tablet Take 81 mg by mouth daily.   Brinzolamide-Brimonidine 1-0.2 % Susp Place 1 drop into both eyes 2 (two) times daily.   cetirizine 10 MG tablet Commonly known as: ZYRTEC Take 10 mg by mouth daily.   chlorhexidine 0.12 % solution Commonly known as: PERIDEX Use as directed 15 mLs in the mouth or throat 2 (two) times daily as needed (for gum inflammation).   cyanocobalamin 2000 MCG tablet Take 2,000 mcg by mouth daily.   docusate sodium  100 MG capsule Commonly known as: COLACE Take 1 capsule (100 mg total) by mouth every 12 (twelve) hours.   dorzolamide-timolol 22.3-6.8 MG/ML ophthalmic solution Commonly known as: COSOPT Apply 1 drop to eye 2 (two) times daily.   dutasteride 0.5 MG capsule Commonly known as: AVODART Take 0.5 mg by mouth every other day.   eucerin cream Apply 1 application topically daily as needed for dry skin.   fluticasone 50 MCG/ACT nasal spray Commonly known as: FLONASE Place 2 sprays into both nostrils daily as needed for allergies.   hydrocortisone 2.5 % rectal cream Commonly known as:  ANUSOL-HC Place 1 application rectally daily as needed for hemorrhoids.   hydrocortisone cream 1 % Apply 1 application topically 3 (three) times daily as needed for itching. Apply to external hemorrhoid x 7-10 days, then prn with reoccurrence   ketoconazole 2 % cream Commonly known as: NIZORAL Apply 1 application topically 2 (two) times daily.   latanoprost 0.005 % ophthalmic solution Commonly known as: XALATAN Place 1 drop into both eyes at bedtime.   Magnesium 250 MG Tabs Take 250 mg by mouth daily.   omeprazole 40 MG capsule Commonly known as: PRILOSEC Take 40 mg by mouth daily.   Polyethyl Glycol-Propyl Glycol 0.4-0.3 % Soln Place 1 drop into both eyes daily as needed for dry eyes.   pravastatin 20 MG tablet Commonly known as: PRAVACHOL Take 20 mg by mouth daily.   tamsulosin 0.4 MG Caps capsule Commonly known as: FLOMAX Take 1 capsule (0.4 mg total) by mouth daily.   timolol 0.25 % ophthalmic solution Commonly known as: TIMOPTIC Place 1 drop into both eyes daily.   triamcinolone 0.1 % Commonly known as: KENALOG Apply 1 application topically 2 (two) times daily as needed (Foot).       Follow-up Information    Jules Husbands, MD. Go on 09/22/2020.   Specialty: General Surgery Why: Hospital follow up, complicated diverticulitis.Go at 4:15pm. Contact information: [address] [place] [address] [phone]        Abbie Sons, MD. Go on 09/09/2020.   Specialty: Urology Why: management of indwelling Foley catheter. Go at 10:00am. Then you will have come back at 2:00pm. Contact information: [place] [address] [phone]                Time coordinating discharge: 32 minutes  Signed:  Jennye Boroughs  [facility] 09/01/2020, 9:25 PM   Pager on [email or web address]. If 7PM-7AM, please contact night-coverage at [email or web address]

## 2020-09-01 NOTE — Progress Notes (Signed)
Patient and wife present for discharge instructions. Follow up appointments made with urology and surgery. Patient discharged with foley in place, patient and wife educated on how to empty and care for the foley at home. Questions answered to patient's satisfaction. [name]

## 2020-09-01 NOTE — Care Management Important Message (Signed)
Important Message  Patient Details  Name: Bradley [name] MRN: [id number] Date of Birth: 1940-11-18   Medicare Important Message Given:  Yes     Dannette Barbara 09/01/2020, 11:02 AM

## 2020-09-02 LAB — CULTURE, BLOOD (ROUTINE X 2): Culture: NO GROWTH

## 2020-09-03 ENCOUNTER — Telehealth: Payer: Self-pay

## 2020-09-03 ENCOUNTER — Other Ambulatory Visit: Payer: Self-pay

## 2020-09-03 ENCOUNTER — Ambulatory Visit: Payer: Medicare Other | Admitting: *Deleted

## 2020-09-03 DIAGNOSIS — N401 Enlarged prostate with lower urinary tract symptoms: Secondary | ICD-10-CM

## 2020-09-03 NOTE — Telephone Encounter (Signed)
Incoming call on triage line from patient in regards to his catheter becoming clogged. Patient reports going to the ED yesterday due to his catheter being blocked by blood clots. In the ED patient was shown how to irrigate should his catheter become blocked again. Patient reports already using the syringes given to him by the ED. He is requesting some more syringes and a leg bag going into the weekend as he does not want to have to go to the ED again. Appt made for nurse schedule today.

## 2020-09-03 NOTE — Progress Notes (Signed)
Patient presented to the office to exchange overnight bag to leg bag. Bag exchanged, irrgation instructions reviewed and demonstrated. Patient verbalized understanding.

## 2020-09-05 ENCOUNTER — Other Ambulatory Visit: Payer: Self-pay

## 2020-09-05 ENCOUNTER — Emergency Department
Admission: EM | Admit: 2020-09-05 | Discharge: 2020-09-05 | Disposition: A | Discharge status: Home / Self Care | Payer: Medicare Other | Attending: Physician Assistant | Admitting: Physician Assistant

## 2020-09-05 ENCOUNTER — Encounter: Payer: Self-pay | Admitting: Physician Assistant

## 2020-09-05 DIAGNOSIS — Z8546 Personal history of malignant neoplasm of prostate: Secondary | ICD-10-CM | POA: Diagnosis not present

## 2020-09-05 DIAGNOSIS — Z79899 Other long term (current) drug therapy: Secondary | ICD-10-CM | POA: Diagnosis not present

## 2020-09-05 DIAGNOSIS — Z7982 Long term (current) use of aspirin: Secondary | ICD-10-CM | POA: Diagnosis not present

## 2020-09-05 DIAGNOSIS — T839XXA Unspecified complication of genitourinary prosthetic device, implant and graft, initial encounter: Secondary | ICD-10-CM

## 2020-09-05 DIAGNOSIS — E119 Type 2 diabetes mellitus without complications: Secondary | ICD-10-CM | POA: Diagnosis not present

## 2020-09-05 DIAGNOSIS — Z951 Presence of aortocoronary bypass graft: Secondary | ICD-10-CM | POA: Insufficient documentation

## 2020-09-05 DIAGNOSIS — T83091A Other mechanical complication of indwelling urethral catheter, initial encounter: Secondary | ICD-10-CM | POA: Diagnosis present

## 2020-09-05 DIAGNOSIS — Z86011 Personal history of benign neoplasm of the brain: Secondary | ICD-10-CM | POA: Diagnosis not present

## 2020-09-05 DIAGNOSIS — I2510 Atherosclerotic heart disease of native coronary artery without angina pectoris: Secondary | ICD-10-CM | POA: Insufficient documentation

## 2020-09-05 DIAGNOSIS — I110 Hypertensive heart disease with heart failure: Secondary | ICD-10-CM | POA: Diagnosis not present

## 2020-09-05 DIAGNOSIS — I5032 Chronic diastolic (congestive) heart failure: Secondary | ICD-10-CM | POA: Insufficient documentation

## 2020-09-05 LAB — URINALYSIS, COMPLETE (UACMP) WITH MICROSCOPIC
Bilirubin Urine: NEGATIVE
Glucose, UA: NEGATIVE mg/dL
Ketones, ur: 5 mg/dL — AB
Nitrite: NEGATIVE
Protein, ur: 100 mg/dL — AB
RBC / HPF: >50 RBC/hpf — ABNORMAL HIGH (ref 0–5)
Specific Gravity, Urine: 1.019 (ref 1.005–1.030)
Squamous Epithelial / HPF: NONE SEEN (ref 0–5)
pH: 6 (ref 5.0–8.0)

## 2020-09-05 NOTE — ED Triage Notes (Signed)
Pt presents via POV c/o clogged urinary catheter. Ambulatory to triage. Reports has been irrigating catheter but bleeding has worsened. Denies pain at this time.

## 2020-09-05 NOTE — Discharge Instructions (Signed)
Continue to flush your Foley as necessary to dislodge clots.  Follow-up with Dr. Dene Gentry office tomorrow by phone, and in person on Thursday.

## 2020-09-05 NOTE — ED Notes (Signed)
ED Provider at bedside. 

## 2020-09-05 NOTE — ED Notes (Addendum)
Patient reports recent admission for diverticulitis x 4 days last week. Patient reports a catheter was placed during admission, and is supposed to remain in place until 09/09/20. Patient reports a recent visit for urinary retention and catheter irrigation. Patient reports irrigating the catheter at home around 1pm today. Patient reports some urinary retention since irrigation. About 253mLs of bloody urine is noted in catheter bag that patient reports has been since 1pm.

## 2020-09-05 NOTE — ED Provider Notes (Signed)
[facility] Emergency Department Provider Note ____________________________________________  Time seen: 1511  I have reviewed [name] triage vital signs and [name] nursing notes.  Bradley  Chief Complaint  Urinary Retention   HPI Bradley [name] is a 80 y.o. male presents himself to [name] ED for evaluation of his Foley catheter.  Patient with an indwelling Foley cath for at least [name] last week.  It was inserted while he was on admission to [name] ED.  He has been followed by his primary provider in [name] interim with evaluation and teaching related to intermittent clots in [name] Foley cath tubing.  Patient presents today to [name] ED, because he had to flush [name] catheter again this morning at about 9 AM.  He notes that he seems to have to flush [name] catheter about every 4 hours or so due to developing clots.  According to [name] patient, urology has done a cystoscope, noted that he has a normal bladder however, believes that [name] bleeding is coming from his prostate.  Patient scheduled to see urology on Thursday for further evaluation.  Denies any fever, chills, sweats,, vomiting, or diarrhea.  Patient otherwise verbalized that he is able to manage and flush [name] catheter as demonstrated.  He is requesting some sterile water that he may be able to continue to manage [name] Foley catheter.  He notes he presented to [name] ED to see there is anything else to do to help prevent [name] catheter from clotting regularly.   Past Medical Bradley:  Diagnosis Date  . Allergy   . Barrett's esophagus   . Blockage of coronary artery of heart ([place])   . BPH (benign prostatic hyperplasia)   . Cataracts, bilateral   . Chronic diastolic CHF (congestive heart failure), NYHA class 2 ([place]) 12/16/2014  . Colon polyp   . Coronary artery disease   . DDD (degenerative disc disease), lumbosacral 12/05/2017  . Diabetes mellitus without complication ([place])    usually running low. to see endocrinologist 01/2020   . Essential  hypertension 06/05/2014  . GERD (gastroesophageal reflux disease)    Barretts esophagus  . GI bleed    a. ~2012 around time of hiatal hernia surgery. 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  . Glaucoma   . Glaucoma   . Hemorrhoids   . Bradley of chicken pox   . Bradley of hiatal hernia 2012  . Hyperlipidemia   . Meningioma ([place])    stable, followed by yearly MRIs  . Migraines   . Neuromuscular disorder (HCC)    neuropathies of lower extrems  . RBBB    also, AF and bradycardia  . Stomach ulcer     Patient Active Problem List   Diagnosis Date Noted  . Perforated diverticulum 08/29/2020  . Diverticulitis of intestine with perforation 08/28/2020  . Bradycardia 02/13/2019  . Meningioma ([place]) 05/30/2018  . External hemorrhoids 04/25/2018  . Incontinence of feces 04/25/2018  . DDD (degenerative disc disease), cervical 03/07/2018  . Glaucoma (increased eye pressure) 12/19/2017  . DDD (degenerative disc disease), lumbosacral 12/05/2017  . Urinary frequency 06/21/2017  . Urinary urgency 06/21/2017  . Bilateral carotid artery stenosis 04/11/2017  . Atrial fibrillation ([place]) 01/30/2015  . 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 Stable angina ([place]) 01/07/2015  . Chronic diastolic CHF (congestive heart failure), NYHA class 2 ([place]) 12/16/2014  . Arteriosclerosis of coronary artery 12/10/2014  . GERD (gastroesophageal reflux disease) 06/16/2014  . Benign essential HTN 06/05/2014  . Mixed hyperlipidemia 06/05/2014  .  Moderate mitral insufficiency 06/05/2014  . Chest pain 01/12/2014  . Angina pectoris ([place]) 10/09/2013  . 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 Esophageal disease 10/09/2013  . Benign prostatic hyperplasia with urinary obstruction 04/01/2012  . 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 ED (erectile dysfunction) of organic origin 04/01/2012  . Elevated prostate specific antigen (PSA) 04/01/2012  . Benign neoplasm of cerebral meninges ([place]) 04/24/2011    Past  Surgical Bradley:  Procedure Laterality Date  . ATHERECTOMY  1992  . CARDIAC CATHETERIZATION N/A 01/07/2015   Procedure: Left Heart Cath;  Surgeon: Corey Skains, MD;  Location: [place] [facility];  Service: Cardiovascular;  Laterality: N/A;  . CARDIAC SURGERY     coronary angioplasty  . CHOLECYSTECTOMY    . COLONOSCOPY  11/03/08, 03/09/14  . CORONARY ANGIOPLASTY    . CORONARY ARTERY BYPASS GRAFT N/A 01/19/2015   Procedure: CORONARY ARTERY BYPASS GRAFTING (CABG), with LIM to LAD, vein to left intermediate, vein to PD, vein to OM, using right greater saphenous vein via endovein harvest.;  Surgeon: Grace Isaac, MD;  Location: [place];  Service: Open Heart Surgery;  Laterality: N/A;  . CYSTOSCOPY WITH INSERTION OF UROLIFT N/A 01/13/2020   Procedure: CYSTOSCOPY WITH INSERTION OF UROLIFT;  Surgeon: Abbie Sons, MD;  Location: [facility];  Service: Urology;  Laterality: N/A;  . DIAGNOSTIC LAPAROSCOPY     cholecystectomy and hiatal hernia repair  . ENDOVEIN HARVEST OF GREATER SAPHENOUS VEIN Right 01/19/2015   Procedure: ENDOVEIN HARVEST OF GREATER SAPHENOUS VEIN;  Surgeon: Grace Isaac, MD;  Location: [place];  Service: Open Heart Surgery;  Laterality: Right;  . ESOPHAGOGASTRODUODENOSCOPY  02/19/13  . ESOPHAGOGASTRODUODENOSCOPY (EGD) WITH PROPOFOL N/A 08/09/2015   Procedure: ESOPHAGOGASTRODUODENOSCOPY (EGD) WITH PROPOFOL;  Surgeon: Hulen Luster, MD;  Location: [facility];  Service: Gastroenterology;  Laterality: N/A;  . ESOPHAGOGASTRODUODENOSCOPY (EGD) WITH PROPOFOL N/A 04/13/2017   Procedure: ESOPHAGOGASTRODUODENOSCOPY (EGD) WITH PROPOFOL;  Surgeon: Toledo, Benay Pike, MD;  Location: [facility] ENDOSCOPY;  Service: Endoscopy;  Laterality: N/A;  . ESOPHAGUS SURGERY    . EYE SURGERY     cataracts BIL  . HERNIA REPAIR    . HIATAL HERNIA REPAIR N/A   . POLYPECTOMY    . TEE WITHOUT CARDIOVERSION N/A 01/19/2015   Procedure: TRANSESOPHAGEAL ECHOCARDIOGRAM (TEE);  Surgeon: Grace Isaac, MD;   Location: [place];  Service: Open Heart Surgery;  Laterality: N/A;    Prior to Admission medications   Medication Sig Start Date End Date Taking? Authorizing Provider  acetaminophen (TYLENOL) 500 MG tablet Take 500 mg by mouth every 6 (six) hours as needed for mild pain or moderate pain.    [provider]  amoxicillin-clavulanate (AUGMENTIN) 875-125 MG tablet Take 1 tablet by mouth 2 (two) times daily for 10 days. 09/01/20 09/11/20  Jennye Boroughs, MD  aspirin EC 81 MG tablet Take 81 mg by mouth daily.    [provider]  Brinzolamide-Brimonidine 1-0.2 % SUSP Place 1 drop into both eyes 2 (two) times daily. 04/08/20   [provider]  cetirizine (ZYRTEC) 10 MG tablet Take 10 mg by mouth daily.     [provider]  chlorhexidine (PERIDEX) 0.12 % solution Use as directed 15 mLs in [name] mouth or throat 2 (two) times daily as needed (for gum inflammation).     [provider]  cyanocobalamin 2000 MCG tablet Take 2,000 mcg by mouth daily.     [provider]  docusate sodium (COLACE) 100 MG  capsule Take 1 capsule (100 mg total) by mouth every 12 (twelve) hours. 01/18/20   Rodriguez-Southworth, Sunday Spillers, PA-C  dorzolamide-timolol (COSOPT) 22.3-6.8 MG/ML ophthalmic solution Apply 1 drop to eye 2 (two) times daily. 09/26/19   [provider]  dutasteride (AVODART) 0.5 MG capsule Take 0.5 mg by mouth every other day. 03/26/15   [provider]  fluticasone (FLONASE) 50 MCG/ACT nasal spray Place 2 sprays into both nostrils daily as needed for allergies. 05/28/20   [provider]  hydrocortisone (ANUSOL-HC) 2.5 % rectal cream Place 1 application rectally daily as needed for hemorrhoids.  02/10/19   [provider]  hydrocortisone cream 1 % Apply 1 application topically 3 (three) times daily as needed for itching. Apply to external hemorrhoid x 7-10 days, then prn with reoccurrence 01/18/20   Rodriguez-Southworth, Sunday Spillers, PA-C   ketoconazole (NIZORAL) 2 % cream Apply 1 application topically 2 (two) times daily. 07/01/20   [provider]  latanoprost (XALATAN) 0.005 % ophthalmic solution Place 1 drop into both eyes at bedtime.    [provider]  Magnesium 250 MG TABS Take 250 mg by mouth daily.     [provider]  omeprazole (PRILOSEC) 40 MG capsule Take 40 mg by mouth daily. 08/16/20   [provider]  Polyethyl Glycol-Propyl Glycol 0.4-0.3 % SOLN Place 1 drop into both eyes daily as needed for dry eyes.    [provider]  pravastatin (PRAVACHOL) 20 MG tablet Take 20 mg by mouth daily.    [provider]  Skin Protectants, Misc. (EUCERIN) cream Apply 1 application topically daily as needed for dry skin.    [provider]  tamsulosin (FLOMAX) 0.4 MG CAPS capsule Take 1 capsule (0.4 mg total) by mouth daily. 06/18/20 12/15/20  Stoioff, Ronda Fairly, MD  timolol (TIMOPTIC) 0.25 % ophthalmic solution Place 1 drop into both eyes daily.    [provider]  triamcinolone cream (KENALOG) 0.1 % Apply 1 application topically 2 (two) times daily as needed (Foot).    [provider]    Allergies Metoprolol, Sulfadiazine, Oxycodone, and Sulfa antibiotics  Family Bradley  Problem Relation Age of Onset  . Parkinsonism Mother   . Kidney disease Father   . Cancer Father        prostate  . Heart disease Father   . Cancer Paternal Uncle        esophageal  . Cancer Paternal Uncle     Social Bradley Social Bradley   Tobacco Use  . Smoking status: Never Smoker  . Smokeless tobacco: Never Used  Vaping Use  . Vaping Use: Never used  Substance Use Topics  . Alcohol use: No    Alcohol/week: 0.0 standard drinks  . Drug use: No    Review of Systems  Constitutional: Negative for fever. Eyes: Negative for visual changes. ENT: Negative for sore throat. Cardiovascular: Negative for chest pain. Respiratory: Negative for shortness of  breath. Gastrointestinal: Negative for abdominal pain, vomiting and diarrhea. Genitourinary: Negative for dysuria.  Foley catheter dysfunction. Musculoskeletal: Negative for back pain. Skin: Negative for rash. Neurological: Negative for headaches, focal weakness or numbness. ____________________________________________  PHYSICAL EXAM:  VITAL SIGNS: ED Triage Vitals  Enc Vitals Group     BP 09/05/20 1440 140/83     Pulse Rate 09/05/20 1440 97     Resp 09/05/20 1440 16     Temp 09/05/20 1440 97.7 F (36.5 C)     Temp Source 09/05/20 1440 Oral  SpO2 09/05/20 1440 95 %     Weight 09/05/20 1440 224 lb (101.6 kg)     Height 09/05/20 1440 6\' 2"  (1.88 m)     Head Circumference --      Peak Flow --      Pain Score 09/05/20 1438 0     Pain Loc --      Pain Edu? --      Excl. in [place]? --     Constitutional: Alert and oriented. Well appearing and in no distress. Head: Normocephalic and atraumatic. Eyes: Conjunctivae are normal. Normal extraocular movements Cardiovascular: Normal rate, regular rhythm. Normal distal pulses. Respiratory: Normal respiratory effort. No wheezes/rales/rhonchi. Gastrointestinal: Soft and nontender. No distention. GU: Foley catheter in place with leg bag attached.  There is bloody urine noted in [name] bag.  No active clots appreciated.  Nursing staff was able to flush [name] catheter without difficulty, dislodging large clot.  Patient normal flow into and out of [name] bladder. Musculoskeletal: Nontender with normal range of motion in all extremities.  Neurologic:  Normal gait without ataxia. Normal speech and language. No gross focal neurologic deficits are appreciated. Skin:  Skin is warm, dry and intact. No rash noted. Psychiatric: Mood and affect are normal. Patient exhibits appropriate insight and judgment. ____________________________________________   LABS (pertinent positives/negatives)  Labs Reviewed  URINALYSIS, COMPLETE (UACMP) WITH MICROSCOPIC -  Abnormal; Notable for [name] following components:      Result Value   Color, Urine RED (*)    APPearance CLOUDY (*)    Hgb urine dipstick LARGE (*)    Ketones, ur 5 (*)    Protein, ur 100 (*)    Leukocytes,Ua TRACE (*)    RBC / HPF >50 (*)    Bacteria, UA FEW (*)    All other components within normal limits   ____________________________________________  PROCEDURES  Bladder scan = 0 cc Foley flushed = 1 clot, flushes pink urine  Procedures ____________________________________________  INITIAL IMPRESSION / ASSESSMENT AND PLAN / ED COURSE  Patient with ED evaluation of dysfunction of his Foley catheter secondary to clotting.  Patient is continuing to complete a course of antibiotics as prescribed.  He denies any other complaints at this time.  He is reassured by [name] fact that [name] catheter flushes regularly.  He is inclined to take supplies with him so he may manage [name] Foley catheter over [name] weekend leading up to his appointment.  He denies any other complaints or concerns at this time.  Urinalysis is benign and reassuring at this time showing only some moderate hematuria.   RAJAN BURGARD was evaluated in Emergency Department on 09/05/2020 for [name] symptoms described in [name] Bradley of present illness. He was evaluated in [name] context of [name] global COVID-19 pandemic, which necessitated consideration that [name] patient might be at risk for infection with [name] SARS-CoV-2 virus that causes COVID-19. Institutional protocols and algorithms that pertain to [name] evaluation of patients at risk for COVID-19 are in a state of rapid change based on information released by regulatory bodies including [name] CDC and federal and state organizations. These policies and algorithms were followed during [name] patient's care in [name] ED. ____________________________________________  FINAL CLINICAL IMPRESSION(S) / ED DIAGNOSES  Final diagnoses:  Complication of Foley catheter, initial encounter [facility])      Carmie End,  Dannielle Karvonen, PA-C 09/05/20 1716    Carrie Mew, MD 09/05/20 2340

## 2020-09-06 ENCOUNTER — Telehealth: Payer: Self-pay

## 2020-09-06 NOTE — Telephone Encounter (Signed)
Incoming call on triage line from patient in regards to catheter bag being empty. Patient reports irrigating his catheter around 1 PM today and no urine being in his catheter bag since. Instructed patient that he needed to come to the office immediately for an office visit. Patient called back to report that while putting his shoes on, he noticed that his pants were wet from the hip to the bottom of the leg. Upon closer inspection, it appeared to the patient that his catheter bag had not been securely reattached to his catheter thus resulting in an empty cath bag for hours. Advised patient should he have any occlusions, large clots, no urine in his bag or pain, he should call the office immediately or go to ED if its after hours. Patient verbalized understanding.

## 2020-09-07 ENCOUNTER — Telehealth: Payer: Self-pay | Admitting: *Deleted

## 2020-09-07 NOTE — Telephone Encounter (Signed)
Patient called in this morning,  He states he is now draining into his bag and he has not had to do a flush.

## 2020-09-09 ENCOUNTER — Ambulatory Visit: Payer: Medicare Other | Admitting: Physician Assistant

## 2020-09-09 ENCOUNTER — Other Ambulatory Visit: Payer: Self-pay

## 2020-09-09 ENCOUNTER — Ambulatory Visit (INDEPENDENT_AMBULATORY_CARE_PROVIDER_SITE_OTHER): Payer: Medicare Other | Admitting: Physician Assistant

## 2020-09-09 DIAGNOSIS — R338 Other retention of urine: Secondary | ICD-10-CM

## 2020-09-09 LAB — BLADDER SCAN AMB NON-IMAGING

## 2020-09-09 NOTE — Patient Instructions (Signed)
You should be taking dutasteride (Avodart) 0.5 mg daily and tamsulosin (Flomax) 0.4 mg daily.

## 2020-09-09 NOTE — Progress Notes (Signed)
Catheter Removal  Patient is present today for a catheter removal.  9ml of water was drained from the balloon. A 16FR foley cath was removed from the bladder no complications were noted . Patient tolerated well.  Performed by: Reina Wilton, PA-C   Follow up/ Additional notes: Push fluids and RTC this afternoon for PVR. 

## 2020-09-09 NOTE — Progress Notes (Signed)
09/09/2020 10:19 AM   Bradley [name] 20-Aug-1940 [id number]  CC: Chief Complaint  Patient presents with  . Urinary Retention    HPI: Bradley [name] is a 80 y.o. male with PMH BPH with prostatomegaly on dutasteride and Flomax and intermittent gross hematuria of likely prostatic origin who recently developed urinary retention in the setting of [name] cystitis while admitted for management of acute diverticulitis with perforation who presents today for outpatient voiding trial.  He received 12 doses of Zosyn 3.375 g IV every 8 hours during his admission and was discharged on culture inappropriate Augmentin.  He took approximately 3 days of Cipro prior to his recent admission.  PMH: Past Medical History:  Diagnosis Date  . Allergy   . Barrett's esophagus   . Blockage of coronary artery of heart ([place])   . BPH (benign prostatic hyperplasia)   . Cataracts, bilateral   . Chronic diastolic CHF (congestive heart failure), NYHA class 2 ([place]) 12/16/2014  . Colon polyp   . Coronary artery disease   . DDD (degenerative disc disease), lumbosacral 12/05/2017  . Diabetes mellitus without complication ([place])    usually running low. to see endocrinologist 01/2020   . Essential hypertension 06/05/2014  . GERD (gastroesophageal reflux disease)    Barretts esophagus  . GI bleed    a. ~2012 around time of hiatal hernia surgery. 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  . Glaucoma   . Glaucoma   . Hemorrhoids   . History of chicken pox   . History of hiatal hernia 2012  . Hyperlipidemia   . Meningioma ([place])    stable, followed by yearly MRIs  . Migraines   . Neuromuscular disorder (HCC)    neuropathies of lower extrems  . RBBB    also, AF and bradycardia  . Stomach ulcer     Surgical History: Past Surgical History:  Procedure Laterality Date  . ATHERECTOMY  1992  . CARDIAC CATHETERIZATION N/A 01/07/2015   Procedure: Left Heart Cath;  Surgeon: Corey Skains, MD;   Location: [place] [facility];  Service: Cardiovascular;  Laterality: N/A;  . CARDIAC SURGERY     coronary angioplasty  . CHOLECYSTECTOMY    . COLONOSCOPY  11/03/08, 03/09/14  . CORONARY ANGIOPLASTY    . CORONARY ARTERY BYPASS GRAFT N/A 01/19/2015   Procedure: CORONARY ARTERY BYPASS GRAFTING (CABG), with LIM to LAD, vein to left intermediate, vein to PD, vein to OM, using right greater saphenous vein via endovein harvest.;  Surgeon: Grace Isaac, MD;  Location: [place];  Service: Open Heart Surgery;  Laterality: N/A;  . CYSTOSCOPY WITH INSERTION OF UROLIFT N/A 01/13/2020   Procedure: CYSTOSCOPY WITH INSERTION OF UROLIFT;  Surgeon: Abbie Sons, MD;  Location: [facility];  Service: Urology;  Laterality: N/A;  . DIAGNOSTIC LAPAROSCOPY     cholecystectomy and hiatal hernia repair  . ENDOVEIN HARVEST OF GREATER SAPHENOUS VEIN Right 01/19/2015   Procedure: ENDOVEIN HARVEST OF GREATER SAPHENOUS VEIN;  Surgeon: Grace Isaac, MD;  Location: [place];  Service: Open Heart Surgery;  Laterality: Right;  . ESOPHAGOGASTRODUODENOSCOPY  02/19/13  . ESOPHAGOGASTRODUODENOSCOPY (EGD) WITH PROPOFOL N/A 08/09/2015   Procedure: ESOPHAGOGASTRODUODENOSCOPY (EGD) WITH PROPOFOL;  Surgeon: Hulen Luster, MD;  [facility];  Service: Gastroenterology;  Laterality: N/A;  . ESOPHAGOGASTRODUODENOSCOPY (EGD) WITH PROPOFOL N/A 04/13/2017   Procedure: ESOPHAGOGASTRODUODENOSCOPY (EGD) WITH PROPOFOL;  Surgeon: Toledo, Benay Pike, MD;  Location: [facility] ENDOSCOPY;  Service: Endoscopy;  Laterality: N/A;  . ESOPHAGUS  SURGERY    . EYE SURGERY     cataracts BIL  . HERNIA REPAIR    . HIATAL HERNIA REPAIR N/A   . POLYPECTOMY    . TEE WITHOUT CARDIOVERSION N/A 01/19/2015   Procedure: TRANSESOPHAGEAL ECHOCARDIOGRAM (TEE);  Surgeon: Grace Isaac, MD;  Location: [place];  Service: Open Heart Surgery;  Laterality: N/A;    Home Medications:  Allergies as of 09/09/2020      Reactions   Metoprolol Other (See Comments)    Hypotension   Sulfadiazine Other (See Comments)   unknown   Oxycodone Other (See Comments)   Reaction:  Hypotension    Sulfa Antibiotics Rash      Medication List       Accurate as of September 09, 2020 10:19 AM. If you have any questions, ask your nurse or doctor.        acetaminophen 500 MG tablet Commonly known as: TYLENOL Take 500 mg by mouth every 6 (six) hours as needed for mild pain or moderate pain.   amoxicillin-clavulanate 875-125 MG tablet Commonly known as: Augmentin Take 1 tablet by mouth 2 (two) times daily for 10 days.   aspirin EC 81 MG tablet Take 81 mg by mouth daily.   Brinzolamide-Brimonidine 1-0.2 % Susp Place 1 drop into both eyes 2 (two) times daily.   cetirizine 10 MG tablet Commonly known as: ZYRTEC Take 10 mg by mouth daily.   chlorhexidine 0.12 % solution Commonly known as: PERIDEX Use as directed 15 mLs in the mouth or throat 2 (two) times daily as needed (for gum inflammation).   cyanocobalamin 2000 MCG tablet Take 2,000 mcg by mouth daily.   docusate sodium 100 MG capsule Commonly known as: COLACE Take 1 capsule (100 mg total) by mouth every 12 (twelve) hours.   dorzolamide-timolol 22.3-6.8 MG/ML ophthalmic solution Commonly known as: COSOPT Apply 1 drop to eye 2 (two) times daily.   dutasteride 0.5 MG capsule Commonly known as: AVODART Take 0.5 mg by mouth every other day.   eucerin cream Apply 1 application topically daily as needed for dry skin.   fluticasone 50 MCG/ACT nasal spray Commonly known as: FLONASE Place 2 sprays into both nostrils daily as needed for allergies.   hydrocortisone 2.5 % rectal cream Commonly known as: ANUSOL-HC Place 1 application rectally daily as needed for hemorrhoids.   hydrocortisone cream 1 % Apply 1 application topically 3 (three) times daily as needed for itching. Apply to external hemorrhoid x 7-10 days, then prn with reoccurrence   ketoconazole 2 % cream Commonly known as:  NIZORAL Apply 1 application topically 2 (two) times daily.   latanoprost 0.005 % ophthalmic solution Commonly known as: XALATAN Place 1 drop into both eyes at bedtime.   Magnesium 250 MG Tabs Take 250 mg by mouth daily.   omeprazole 40 MG capsule Commonly known as: PRILOSEC Take 40 mg by mouth daily.   Polyethyl Glycol-Propyl Glycol 0.4-0.3 % Soln Place 1 drop into both eyes daily as needed for dry eyes.   pravastatin 20 MG tablet Commonly known as: PRAVACHOL Take 20 mg by mouth daily.   tamsulosin 0.4 MG Caps capsule Commonly known as: FLOMAX Take 1 capsule (0.4 mg total) by mouth daily.   timolol 0.25 % ophthalmic solution Commonly known as: TIMOPTIC Place 1 drop into both eyes daily.   triamcinolone 0.1 % Commonly known as: KENALOG Apply 1 application topically 2 (two) times daily as needed (Foot).       Allergies:  Allergies  Allergen Reactions  . Metoprolol Other (See Comments)    Hypotension  . Sulfadiazine Other (See Comments)    unknown  . Oxycodone Other (See Comments)    Reaction:  Hypotension   . Sulfa Antibiotics Rash    Family History: Family History  Problem Relation Age of Onset  . Parkinsonism Mother   . Kidney disease Father   . Cancer Father        prostate  . Heart disease Father   . Cancer Paternal Uncle        esophageal  . Cancer Paternal Uncle     Social History:   reports that he has never smoked. He has never used smokeless tobacco. He reports that he does not drink alcohol and does not use drugs.  Physical Exam: There were no vitals taken for this visit.  Constitutional:  Alert and oriented, no acute distress, nontoxic appearing HEENT: Hard of hearing Cardiovascular: No clubbing, cyanosis, or edema Respiratory: Normal respiratory effort, no increased work of breathing Skin: No rashes, bruises or suspicious lesions Neurologic: Grossly intact, no focal deficits, moving all 4 extremities Psychiatric: Normal mood and  affect  Laboratory Data: Results for orders placed or performed in visit on 09/09/20  Bladder Scan (Post Void Residual) in office  Result Value Ref Range   Scan Result 268mL    Assessment & Plan:   1. Acute urinary retention Foley catheter removed in the morning, see separate procedure note for details.  Patient returned to clinic in the afternoon.  He reports drinking approximately 24 ounces of water.  He was not able to urinate, however he stated he did not feel the urge to do so and was not in any discomfort.  PVR 202 mL and patient subsequently urinated in office.  I have the patient repeat PVR tomorrow.  He is in agreement with this plan, however he requests to cancel the appointment if he continues to void spontaneously overnight and does not develop any recurrent lower abdominal pain.  Given symptomatic urinary retention on recent presentation, I am in agreement with this plan. - Bladder Scan (Post Void Residual) in office   Return in about 1 day (around 09/10/2020) for Repeat PVR.  Debroah Loop, PA-C  [facility] [address], [place], [place] [address] [phone]

## 2020-09-10 ENCOUNTER — Ambulatory Visit (INDEPENDENT_AMBULATORY_CARE_PROVIDER_SITE_OTHER): Payer: Medicare Other | Admitting: Physician Assistant

## 2020-09-10 VITALS — BP 149/82 | HR 88 | Wt 224.0 lb

## 2020-09-10 DIAGNOSIS — R338 Other retention of urine: Secondary | ICD-10-CM

## 2020-09-10 LAB — MICROSCOPIC EXAMINATION
Bacteria, UA: NONE SEEN
RBC, Urine: >30 /hpf — AB (ref 0–2)

## 2020-09-10 LAB — URINALYSIS, COMPLETE
Bilirubin, UA: NEGATIVE
Glucose, UA: NEGATIVE
Ketones, UA: NEGATIVE
Leukocytes,UA: NEGATIVE
Nitrite, UA: NEGATIVE
Specific Gravity, UA: 1.02 (ref 1.005–1.030)
Urobilinogen, Ur: 0.2 mg/dL (ref 0.2–1.0)
pH, UA: 6 (ref 5.0–7.5)

## 2020-09-10 LAB — BLADDER SCAN AMB NON-IMAGING: Scan Result: 727

## 2020-09-10 NOTE — Progress Notes (Signed)
09/10/2020 10:11 AM   Bradley [name] 30-Jun-1941 [id number]  CC: Chief Complaint  Patient presents with  . Urinary Retention    HPI: Bradley [name] is a 80 y.o. male with PMH BPH with prostatomegaly on dutasteride and Flomax and intermittent gross hematuria of likely prostatic origin with a recent history of urinary retention in the setting of [name] cystitis while admitted for management of acute diverticulitis with perforation who presents today for repeat PVR after outpatient voiding trial yesterday.  Today he reports having voided multiple times overnight.  He has had some testicular discomfort this morning.  He reports gross hematuria without clot passage.    In-office UA today positive for 3+ blood and 2+ protein; urine microscopy with >30 RBCs/HPF and red cell casts. PVR 720mL.  PMH: Past Medical History:  Diagnosis Date  . Allergy   . Barrett's esophagus   . Blockage of coronary artery of heart ([place])   . BPH (benign prostatic hyperplasia)   . Cataracts, bilateral   . Chronic diastolic CHF (congestive heart failure), NYHA class 2 ([place]) 12/16/2014  . Colon polyp   . Coronary artery disease   . DDD (degenerative disc disease), lumbosacral 12/05/2017  . Diabetes mellitus without complication ([place])    usually running low. to see endocrinologist 01/2020   . Essential hypertension 06/05/2014  . GERD (gastroesophageal reflux disease)    Barretts esophagus  . GI bleed    a. ~2012 around time of hiatal hernia surgery. 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  . Glaucoma   . Glaucoma   . Hemorrhoids   . History of chicken pox   . History of hiatal hernia 2012  . Hyperlipidemia   . Meningioma ([place])    stable, followed by yearly MRIs  . Migraines   . Neuromuscular disorder (HCC)    neuropathies of lower extrems  . RBBB    also, AF and bradycardia  . Stomach ulcer     Surgical History: Past Surgical History:  Procedure Laterality Date  .  ATHERECTOMY  1992  . CARDIAC CATHETERIZATION N/A 01/07/2015   Procedure: Left Heart Cath;  Surgeon: Corey Skains, MD;  Location: [place] [facility];  Service: Cardiovascular;  Laterality: N/A;  . CARDIAC SURGERY     coronary angioplasty  . CHOLECYSTECTOMY    . COLONOSCOPY  11/03/08, 03/09/14  . CORONARY ANGIOPLASTY    . CORONARY ARTERY BYPASS GRAFT N/A 01/19/2015   Procedure: CORONARY ARTERY BYPASS GRAFTING (CABG), with LIM to LAD, vein to left intermediate, vein to PD, vein to OM, using right greater saphenous vein via endovein harvest.;  Surgeon: Grace Isaac, MD;  Location: [place];  Service: Open Heart Surgery;  Laterality: N/A;  . CYSTOSCOPY WITH INSERTION OF UROLIFT N/A 01/13/2020   Procedure: CYSTOSCOPY WITH INSERTION OF UROLIFT;  Surgeon: Abbie Sons, MD;  Location: [facility];  Service: Urology;  Laterality: N/A;  . DIAGNOSTIC LAPAROSCOPY     cholecystectomy and hiatal hernia repair  . ENDOVEIN HARVEST OF GREATER SAPHENOUS VEIN Right 01/19/2015   Procedure: ENDOVEIN HARVEST OF GREATER SAPHENOUS VEIN;  Surgeon: Grace Isaac, MD;  Location: [place];  Service: Open Heart Surgery;  Laterality: Right;  . ESOPHAGOGASTRODUODENOSCOPY  02/19/13  . ESOPHAGOGASTRODUODENOSCOPY (EGD) WITH PROPOFOL N/A 08/09/2015   Procedure: ESOPHAGOGASTRODUODENOSCOPY (EGD) WITH PROPOFOL;  Surgeon: Hulen Luster, MD;  Location: [facility];  Service: Gastroenterology;  Laterality: N/A;  . ESOPHAGOGASTRODUODENOSCOPY (EGD) WITH PROPOFOL N/A 04/13/2017   Procedure: ESOPHAGOGASTRODUODENOSCOPY (EGD) WITH  PROPOFOL;  Surgeon: Toledo, Benay Pike, MD;  Location: [facility] ENDOSCOPY;  Service: Endoscopy;  Laterality: N/A;  . ESOPHAGUS SURGERY    . EYE SURGERY     cataracts BIL  . HERNIA REPAIR    . HIATAL HERNIA REPAIR N/A   . POLYPECTOMY    . TEE WITHOUT CARDIOVERSION N/A 01/19/2015   Procedure: TRANSESOPHAGEAL ECHOCARDIOGRAM (TEE);  Surgeon: Grace Isaac, MD;  Location: [place];  Service: Open Heart Surgery;   Laterality: N/A;    Home Medications:  Allergies as of 09/10/2020      Reactions   Metoprolol Other (See Comments)   Hypotension   Sulfadiazine Other (See Comments)   unknown   Oxycodone Other (See Comments)   Reaction:  Hypotension    Sulfa Antibiotics Rash      Medication List       Accurate as of September 10, 2020 10:11 AM. If you have any questions, ask your nurse or doctor.        acetaminophen 500 MG tablet Commonly known as: TYLENOL Take 500 mg by mouth every 6 (six) hours as needed for mild pain or moderate pain.   amoxicillin-clavulanate 875-125 MG tablet Commonly known as: Augmentin Take 1 tablet by mouth 2 (two) times daily for 10 days.   aspirin EC 81 MG tablet Take 81 mg by mouth daily.   atorvastatin 40 MG tablet Commonly known as: LIPITOR TAKE ONE-HALF TABLET BY MOUTH QHS (ATBEDTIME)   Brinzolamide-Brimonidine 1-0.2 % Susp Place 1 drop into both eyes 2 (two) times daily.   carboxymethylcellulose 0.5 % Soln Commonly known as: REFRESH PLUS INSTILL 1 DROP IN EACH EYE FOUR TIMES A DAY AS NEEDED   cetirizine 10 MG tablet Commonly known as: ZYRTEC Take 10 mg by mouth daily.   chlorhexidine 0.12 % solution Commonly known as: PERIDEX Use as directed 15 mLs in the mouth or throat 2 (two) times daily as needed (for gum inflammation).   cyanocobalamin 2000 MCG tablet Take 2,000 mcg by mouth daily.   docusate sodium 100 MG capsule Commonly known as: COLACE Take 1 capsule (100 mg total) by mouth every 12 (twelve) hours.   dorzolamide-timolol 22.3-6.8 MG/ML ophthalmic solution Commonly known as: COSOPT Apply 1 drop to eye 2 (two) times daily.   dutasteride 0.5 MG capsule Commonly known as: AVODART Take 0.5 mg by mouth every other day.   eucerin cream Apply 1 application topically daily as needed for dry skin.   fluticasone 50 MCG/ACT nasal spray Commonly known as: FLONASE Place 2 sprays into both nostrils daily as needed for allergies.    hydrocortisone 2.5 % rectal cream Commonly known as: ANUSOL-HC Place 1 application rectally daily as needed for hemorrhoids.   hydrocortisone cream 1 % Apply 1 application topically 3 (three) times daily as needed for itching. Apply to external hemorrhoid x 7-10 days, then prn with reoccurrence   ketoconazole 2 % cream Commonly known as: NIZORAL Apply 1 application topically 2 (two) times daily.   latanoprost 0.005 % ophthalmic solution Commonly known as: XALATAN Place 1 drop into both eyes at bedtime.   Magnesium 250 MG Tabs Take 250 mg by mouth daily.   omeprazole 40 MG capsule Commonly known as: PRILOSEC Take 40 mg by mouth daily.   Polyethyl Glycol-Propyl Glycol 0.4-0.3 % Soln Place 1 drop into both eyes daily as needed for dry eyes.   pravastatin 20 MG tablet Commonly known as: PRAVACHOL Take 20 mg by mouth daily.   tamsulosin 0.4 MG Caps capsule  Commonly known as: FLOMAX Take 1 capsule (0.4 mg total) by mouth daily.   timolol 0.25 % ophthalmic solution Commonly known as: TIMOPTIC Place 1 drop into both eyes daily.   triamcinolone 0.1 % Commonly known as: KENALOG Apply 1 application topically 2 (two) times daily as needed (Foot).       Allergies:  Allergies  Allergen Reactions  . Metoprolol Other (See Comments)    Hypotension  . Sulfadiazine Other (See Comments)    unknown  . Oxycodone Other (See Comments)    Reaction:  Hypotension   . Sulfa Antibiotics Rash    Family History: Family History  Problem Relation Age of Onset  . Parkinsonism Mother   . Kidney disease Father   . Cancer Father        prostate  . Heart disease Father   . Cancer Paternal Uncle        esophageal  . Cancer Paternal Uncle     Social History:   reports that he has never smoked. He has never used smokeless tobacco. He reports that he does not drink alcohol and does not use drugs.  Physical Exam: BP (!) 149/82   Pulse 88   Wt 224 lb (101.6 kg)   BMI 28.76 kg/m    Constitutional:  Alert and oriented, no acute distress, nontoxic appearing HEENT: [place], AT Cardiovascular: No clubbing, cyanosis, or edema Respiratory: Normal respiratory effort, no increased work of breathing Skin: No rashes, bruises or suspicious lesions Neurologic: Grossly intact, no focal deficits, moving all 4 extremities Psychiatric: Normal mood and affect  Laboratory Data: Results for orders placed or performed in visit on 09/10/20  BLADDER SCAN AMB NON-IMAGING  Result Value Ref Range   Scan Result 727 ml    Assessment & Plan:   1. Acute urinary retention Recurrent urinary retention today.  I offered the patient Foley catheter replacement and he agreed, see separate procedure note for details. - Urinalysis, Complete - BLADDER SCAN AMB NON-IMAGING   Return in about 2 weeks (around 09/24/2020) for Voiding trial.  Debroah Loop, PA-C  [facility] [address], [place], [place] [address] [phone]

## 2020-09-10 NOTE — Progress Notes (Signed)
Simple Catheter Placement  Due to urinary retention patient is present today for a foley cath placement.  Patient was cleaned and prepped in a sterile fashion with betadine and 2% lidocaine jelly was instilled into the urethra. A 18 FR coude foley catheter was inserted, urine return was noted  730ml, urine was dark red in color, runny, and without clots.  The balloon was filled with 30cc of sterile water and catheter was placed on tension.  The catheter was then irrigated with 120cc sterile water with return of clear red efflux without clots. A leg bag was attached for drainage. Patient was also given a night bag to take home and was given instruction on how to change from one bag to another.  Patient was given instruction on proper catheter care.  Patient tolerated well, no complications were noted   Performed by: Debroah Loop, PA-C

## 2020-09-11 ENCOUNTER — Emergency Department
Admission: EM | Admit: 2020-09-11 | Discharge: 2020-09-11 | Disposition: A | Discharge status: Home / Self Care | Payer: Medicare Other | Attending: Emergency Medicine | Admitting: Emergency Medicine

## 2020-09-11 ENCOUNTER — Other Ambulatory Visit: Payer: Self-pay

## 2020-09-11 ENCOUNTER — Encounter: Payer: Self-pay | Admitting: Intensive Care

## 2020-09-11 DIAGNOSIS — I5032 Chronic diastolic (congestive) heart failure: Secondary | ICD-10-CM | POA: Diagnosis not present

## 2020-09-11 DIAGNOSIS — Z466 Encounter for fitting and adjustment of urinary device: Secondary | ICD-10-CM | POA: Insufficient documentation

## 2020-09-11 DIAGNOSIS — Z7982 Long term (current) use of aspirin: Secondary | ICD-10-CM | POA: Diagnosis not present

## 2020-09-11 DIAGNOSIS — E119 Type 2 diabetes mellitus without complications: Secondary | ICD-10-CM | POA: Insufficient documentation

## 2020-09-11 DIAGNOSIS — Z951 Presence of aortocoronary bypass graft: Secondary | ICD-10-CM | POA: Insufficient documentation

## 2020-09-11 DIAGNOSIS — R34 Anuria and oliguria: Secondary | ICD-10-CM | POA: Insufficient documentation

## 2020-09-11 DIAGNOSIS — I110 Hypertensive heart disease with heart failure: Secondary | ICD-10-CM | POA: Insufficient documentation

## 2020-09-11 DIAGNOSIS — I2510 Atherosclerotic heart disease of native coronary artery without angina pectoris: Secondary | ICD-10-CM | POA: Diagnosis not present

## 2020-09-11 DIAGNOSIS — Z978 Presence of other specified devices: Secondary | ICD-10-CM

## 2020-09-11 NOTE — ED Notes (Signed)
Pt catheter flushed with sterile water. 47ml pushed in, 77ml came out.

## 2020-09-11 NOTE — ED Triage Notes (Signed)
Foley placed at urologist yesterday. Patient reports his foley catheter is clogged. Little output. Urine is bloody in color. Reports he has been trying to irrigate catheter but still wont drain. Denies pain at this time

## 2020-09-12 NOTE — ED Provider Notes (Signed)
[facility] Emergency Department Provider Note  ____________________________________________   Event Date/Time   First MD Initiated Contact with Patient 09/11/20 1702     (approximate)  I have reviewed the triage vital signs and the nursing notes.   HISTORY  Chief Complaint Catheter problem  HPI Bradley [name] is a 80 y.o. male who presents to the emergency department for evaluation of concerns over his urinary catheter.  Patient states that he previously had one in place up until the middle of this past week in treatment for a UTI.  The catheter was removed by urology, and then the patient had persistence of problems and had a new one inserted yesterday.  Patient states that he has had decreasing output from the catheter, notably he tried flushing it last night with minimal that he was able to get back, is concerned that it is possibly clotted off due to the amount of blood in his urine.  He states that overnight, the Foley seemed to drain again, but then he noted that from morning time to lunch he only had about 100 mL and was concerned it was not working again.  He states that when he flushes 20 mL in he is able to get 20 mL out.  He denies any fevers, chills or other problems with his catheter.        Past Medical History:  Diagnosis Date  . Allergy   . Barrett's esophagus   . Blockage of coronary artery of heart ([place])   . BPH (benign prostatic hyperplasia)   . Cataracts, bilateral   . Chronic diastolic CHF (congestive heart failure), NYHA class 2 ([place]) 12/16/2014  . Colon polyp   . Coronary artery disease   . DDD (degenerative disc disease), lumbosacral 12/05/2017  . Diabetes mellitus without complication ([place])    usually running low. to see endocrinologist 01/2020   . Essential hypertension 06/05/2014  . GERD (gastroesophageal reflux disease)    Barretts esophagus  . GI bleed    a. ~2012 around time of hiatal hernia surgery. Developed melena/BRBPR,  was placed back on Dexilant long-term.  . Glaucoma   . Glaucoma   . Hemorrhoids   . History of chicken pox   . History of hiatal hernia 2012  . Hyperlipidemia   . Meningioma ([place])    stable, followed by yearly MRIs  . Migraines   . Neuromuscular disorder (HCC)    neuropathies of lower extrems  . RBBB    also, AF and bradycardia  . Stomach ulcer     Patient Active Problem List   Diagnosis Date Noted  . Perforated diverticulum 08/29/2020  . Diverticulitis of intestine with perforation 08/28/2020  . Bradycardia 02/13/2019  . Meningioma ([place]) 05/30/2018  . External hemorrhoids 04/25/2018  . Incontinence of feces 04/25/2018  . DDD (degenerative disc disease), cervical 03/07/2018  . Glaucoma (increased eye pressure) 12/19/2017  . DDD (degenerative disc disease), lumbosacral 12/05/2017  . Urinary frequency 06/21/2017  . Urinary urgency 06/21/2017  . Bilateral carotid artery stenosis 04/11/2017  . Atrial fibrillation ([place]) 01/30/2015  . 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 Stable angina ([place]) 01/07/2015  . Chronic diastolic CHF (congestive heart failure), NYHA class 2 ([place]) 12/16/2014  . Arteriosclerosis of coronary artery 12/10/2014  . GERD (gastroesophageal reflux disease) 06/16/2014  . Benign essential HTN 06/05/2014  . Mixed hyperlipidemia 06/05/2014  . Moderate mitral insufficiency 06/05/2014  . Chest pain 01/12/2014  . Angina pectoris ([place]) 10/09/2013  . Benign neoplasm of  large bowel 10/09/2013  . Esophageal disease 10/09/2013  . Benign prostatic hyperplasia with urinary obstruction 04/01/2012  . 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 ED (erectile dysfunction) of organic origin 04/01/2012  . Elevated prostate specific antigen (PSA) 04/01/2012  . Benign neoplasm of cerebral meninges ([place]) 04/24/2011    Past Surgical History:  Procedure Laterality Date  . ATHERECTOMY  1992  . CARDIAC CATHETERIZATION N/A 01/07/2015   Procedure: Left Heart Cath;  Surgeon: Corey Skains, MD;   Location: [place] [facility];  Service: Cardiovascular;  Laterality: N/A;  . CARDIAC SURGERY     coronary angioplasty  . CHOLECYSTECTOMY    . COLONOSCOPY  11/03/08, 03/09/14  . CORONARY ANGIOPLASTY    . CORONARY ARTERY BYPASS GRAFT N/A 01/19/2015   Procedure: CORONARY ARTERY BYPASS GRAFTING (CABG), with LIM to LAD, vein to left intermediate, vein to PD, vein to OM, using right greater saphenous vein via endovein harvest.;  Surgeon: Grace Isaac, MD;  Location: [place];  Service: Open Heart Surgery;  Laterality: N/A;  . CYSTOSCOPY WITH INSERTION OF UROLIFT N/A 01/13/2020   Procedure: CYSTOSCOPY WITH INSERTION OF UROLIFT;  Surgeon: Abbie Sons, MD;  Location: [facility];  Service: Urology;  Laterality: N/A;  . DIAGNOSTIC LAPAROSCOPY     cholecystectomy and hiatal hernia repair  . ENDOVEIN HARVEST OF GREATER SAPHENOUS VEIN Right 01/19/2015   Procedure: ENDOVEIN HARVEST OF GREATER SAPHENOUS VEIN;  Surgeon: Grace Isaac, MD;  Location: [place];  Service: Open Heart Surgery;  Laterality: Right;  . ESOPHAGOGASTRODUODENOSCOPY  02/19/13  . ESOPHAGOGASTRODUODENOSCOPY (EGD) WITH PROPOFOL N/A 08/09/2015   Procedure: ESOPHAGOGASTRODUODENOSCOPY (EGD) WITH PROPOFOL;  Surgeon: Hulen Luster, MD;  Location: [facility] END[facility]COPY;  Service: Gastroenterology;  Laterality: N/A;  . ESOPHAGOGASTRODUODENOSCOPY (EGD) WITH PROPOFOL N/A 04/13/2017   Procedure: ESOPHAGOGASTRODUODENOSCOPY (EGD) WITH PROPOFOL;  Surgeon: Toledo, Benay Pike, MD;  Location: [facility] ENDOSCOPY;  Service: Endoscopy;  Laterality: N/A;  . ESOPHAGUS SURGERY    . EYE SURGERY     cataracts BIL  . HERNIA REPAIR    . HIATAL HERNIA REPAIR N/A   . POLYPECTOMY    . TEE WITHOUT CARDIOVERSION N/A 01/19/2015   Procedure: TRANSESOPHAGEAL ECHOCARDIOGRAM (TEE);  Surgeon: Grace Isaac, MD;  Location: [place];  Service: Open Heart Surgery;  Laterality: N/A;    Prior to Admission medications   Medication Sig Start Date End Date Taking? Authorizing Provider   acetaminophen (TYLENOL) 500 MG tablet Take 500 mg by mouth every 6 (six) hours as needed for mild pain or moderate pain.    [provider]  aspirin EC 81 MG tablet Take 81 mg by mouth daily.    [provider]  atorvastatin (LIPITOR) 40 MG tablet TAKE ONE-HALF TABLET BY MOUTH QHS (ATBEDTIME) 06/24/10   [provider]  Brinzolamide-Brimonidine 1-0.2 % SUSP Place 1 drop into both eyes 2 (two) times daily. 04/08/20   [provider]  carboxymethylcellulose (REFRESH PLUS) 0.5 % SOLN INSTILL 1 DROP IN [facility] EYE FOUR TIMES A DAY AS NEEDED 07/29/20   [provider]  cetirizine (ZYRTEC) 10 MG tablet Take 10 mg by mouth daily.     [provider]  chlorhexidine (PERIDEX) 0.12 % solution Use as directed 15 mLs in the mouth or throat 2 (two) times daily as needed (for gum inflammation).     [provider]  cyanocobalamin 2000 MCG tablet Take 2,000 mcg by mouth daily.     [provider]  docusate sodium (COLACE) 100 MG  capsule Take 1 capsule (100 mg total) by mouth every 12 (twelve) hours. 01/18/20   Rodriguez-Southworth, Sunday Spillers, PA-C  dorzolamide-timolol (COSOPT) 22.3-6.8 MG/ML ophthalmic solution Apply 1 drop to eye 2 (two) times daily. 09/26/19   [provider]  dutasteride (AVODART) 0.5 MG capsule Take 0.5 mg by mouth every other day. 03/26/15   [provider]  fluticasone (FLONASE) 50 MCG/ACT nasal spray Place 2 sprays into both nostrils daily as needed for allergies. 05/28/20   [provider]  hydrocortisone (ANUSOL-HC) 2.5 % rectal cream Place 1 application rectally daily as needed for hemorrhoids.  02/10/19   [provider]  hydrocortisone cream 1 % Apply 1 application topically 3 (three) times daily as needed for itching. Apply to external hemorrhoid x 7-10 days, then prn with reoccurrence 01/18/20   Rodriguez-Southworth, Sunday Spillers, PA-C  ketoconazole (NIZORAL) 2 % cream Apply 1 application topically 2  (two) times daily. 07/01/20   [provider]  latanoprost (XALATAN) 0.005 % ophthalmic solution Place 1 drop into both eyes at bedtime.    [provider]  Magnesium 250 MG TABS Take 250 mg by mouth daily.     [provider]  omeprazole (PRILOSEC) 40 MG capsule Take 40 mg by mouth daily. 08/16/20   [provider]  Polyethyl Glycol-Propyl Glycol 0.4-0.3 % SOLN Place 1 drop into both eyes daily as needed for dry eyes.    [provider]  pravastatin (PRAVACHOL) 20 MG tablet Take 20 mg by mouth daily.    [provider]  Skin Protectants, Misc. (EUCERIN) cream Apply 1 application topically daily as needed for dry skin.    [provider]  tamsulosin (FLOMAX) 0.4 MG CAPS capsule Take 1 capsule (0.4 mg total) by mouth daily. 06/18/20 12/15/20  Stoioff, Ronda Fairly, MD  timolol (TIMOPTIC) 0.25 % ophthalmic solution Place 1 drop into both eyes daily.    [provider]  triamcinolone cream (KENALOG) 0.1 % Apply 1 application topically 2 (two) times daily as needed (Foot).    [provider]    Allergies Metoprolol, Sulfadiazine, Oxycodone, and Sulfa antibiotics  Family History  Problem Relation Age of Onset  . Parkinsonism Mother   . Kidney disease Father   . Cancer Father        prostate  . Heart disease Father   . Cancer Paternal Uncle        esophageal  . Cancer Paternal Uncle     Social History Social History   Tobacco Use  . Smoking status: Never Smoker  . Smokeless tobacco: Never Used  Vaping Use  . Vaping Use: Never used  Substance Use Topics  . Alcohol use: No    Alcohol/week: 0.0 standard drinks  . Drug use: No    Review of Systems Constitutional: No fever/chills Eyes: No visual changes. ENT: No sore throat. Cardiovascular: Denies chest pain. Respiratory: Denies shortness of breath. Gastrointestinal: No abdominal pain.  No nausea, no vomiting.  No diarrhea.  No constipation. Genitourinary: +  Catheter in place with dysfunction Musculoskeletal: Negative for back pain. Skin: Negative for rash. Neurological: Negative for headaches, focal weakness or numbness.  ____________________________________________   PHYSICAL EXAM:  VITAL SIGNS: ED Triage Vitals  Enc Vitals Group     BP 09/11/20 1536 (!) 149/81     Pulse Rate 09/11/20 1536 79     Resp 09/11/20 1536 16     Temp 09/11/20 1536 (!) 97.5 F (36.4 C)     Temp Source 09/11/20 1536 Oral  SpO2 09/11/20 1536 98 %     Weight 09/11/20 1532 226 lb (102.5 kg)     Height 09/11/20 1532 6\' 2"  (1.88 m)     Head Circumference --      Peak Flow --      Pain Score 09/11/20 1532 0     Pain Loc --      Pain Edu? --      Excl. in [place]? --    Constitutional: Alert and oriented. Well appearing and in no acute distress. Eyes: Conjunctivae are normal.  Head: Atraumatic. Mouth/Throat: Mucous membranes are moist.   Neck: No stridor.   Gastrointestinal: Soft and nontender. No distention. No abdominal bruits. No CVA tenderness. Genitourinary: Foley catheter currently in place with approximately 100 to 200 mL of bloody output. Musculoskeletal: No lower extremity tenderness nor edema.  No joint effusions. Neurologic:  Normal speech and language. No gross focal neurologic deficits are appreciated. No gait instability. Skin:  Skin is warm, dry and intact. No rash noted. Psychiatric: Mood and affect are normal. Speech and behavior are normal.  ____________________________________________   LABS (all labs ordered are listed, but only abnormal results are displayed)  Labs Reviewed  URINE CULTURE   ____________________________________________   INITIAL IMPRESSION / ASSESSMENT AND PLAN / ED COURSE  As part of my medical decision making, I reviewed the following data within the [place] notes reviewed and incorporated and Notes from prior ED visits        Dejay Kronk is an 80 year old male who presents to  the emergency department for evaluation of catheter problem.  See HPI for further details.  On physical exam, the patient does have a urinary catheter still in place with a mild amount of output.  His output will be sent for culture.  Bladder scan reveals the patient still has approximately 200 mL in the bladder.  Catheter was flushed by nursing staff and upon repositioning of the patient, they were able to get significant output from Foley.  At this time, feel that the Foley is functional and do not want to replace due to increased risk of another infectious source.  Patient is amenable with this plan, was reeducated on Foley catheter care and will follow up with urology on Monday or return to the emergency department for worsening.      ____________________________________________   FINAL CLINICAL IMPRESSION(S) / ED DIAGNOSES  Final diagnoses:  Foley catheter in place     ED Discharge Orders    None      *Please note:  Bradley [name] was evaluated in Emergency Department on 09/12/2020 for the symptoms described in the history of present illness. He was evaluated in the context of the global COVID-19 pandemic, which necessitated consideration that the patient might be at risk for infection with the SARS-CoV-2 virus that causes COVID-19. Institutional protocols and algorithms that pertain to the evaluation of patients at risk for COVID-19 are in a state of rapid change based on information released by regulatory bodies including the CDC and federal and state organizations. These policies and algorithms were followed during the patient's care in the ED.  Some ED evaluations and interventions may be delayed as a result of limited staffing during and the pandemic.*   Note:  This document was prepared using Dragon voice recognition software and may include unintentional dictation errors.   Marlana Salvage, PA 09/12/20 1338    Nance Pear, MD 09/12/20 [id number]

## 2020-09-13 ENCOUNTER — Telehealth: Payer: Self-pay | Admitting: Physician Assistant

## 2020-09-13 ENCOUNTER — Other Ambulatory Visit: Payer: Self-pay

## 2020-09-13 ENCOUNTER — Ambulatory Visit (INDEPENDENT_AMBULATORY_CARE_PROVIDER_SITE_OTHER): Payer: Medicare Other | Admitting: Family Medicine

## 2020-09-13 DIAGNOSIS — R82998 Other abnormal findings in urine: Secondary | ICD-10-CM

## 2020-09-13 DIAGNOSIS — R339 Retention of urine, unspecified: Secondary | ICD-10-CM

## 2020-09-13 LAB — URINE CULTURE: Culture: NO GROWTH

## 2020-09-13 NOTE — Progress Notes (Signed)
Patient presents today with complaints of the leg strap coming down his leg and the leg bag leaking.  I replaced the leg strap with the attachment sticker and replaced the leg bag with a new one. The bag was draining properly and patient was satisfied. An appointment for catheter removal and voiding trial is scheduled for Monday March 7.

## 2020-09-13 NOTE — Telephone Encounter (Signed)
Please contact the patient and inform him that his urine testing on Friday with me revealed red blood cell casts, which are clumps of red blood cells that form within the kidney. This finding suggests that the source of his hematuria may be his kidneys. I have placed a referral to nephrology today for them to evaluate this further. He should receive a call from nephrology to schedule this appointment.

## 2020-09-15 NOTE — Telephone Encounter (Signed)
Notified patient as advised. Patient reports the urine in his bag as clear now and wanted to know if that changed anything. Please advise.

## 2020-09-16 ENCOUNTER — Telehealth: Payer: Self-pay

## 2020-09-16 NOTE — Telephone Encounter (Signed)
Patient in agreeance with this plan for Monday.

## 2020-09-16 NOTE — Telephone Encounter (Signed)
Notified patient as advised, patient verbalized understanding and agreed to keep his Monday appt.

## 2020-09-16 NOTE — Telephone Encounter (Signed)
Incoming call from pt on triage line, he states that he would like to speak with Sam in regards to his medications. He states the is unsure what medications he needs to remain on. He states that he is currently taking tamsulosin and finasteride. He also notes that he is not having any issues with his catheter, states bleeding has resolved. He also would like to know if he can have his catheter removed tomorrow instead of Monday. Please advise.

## 2020-09-16 NOTE — Telephone Encounter (Signed)
Let's repeat a UA and microscopy at his next clinic visit with me. If the casts are persistent, then he needs to follow-up with nephrology. If there are no more casts, the UA finding may be a reader error and it would be okay to defer.

## 2020-09-16 NOTE — Telephone Encounter (Signed)
He should remain on both medications for his urination. I recommend keeping his appointment on Monday. If he passes his voiding trial tomorrow but goes back into retention over the weekend, he'll have to go to the ED. If we wait until Monday, we'll be here all week to help him.

## 2020-09-20 ENCOUNTER — Other Ambulatory Visit: Payer: Self-pay

## 2020-09-20 ENCOUNTER — Ambulatory Visit (INDEPENDENT_AMBULATORY_CARE_PROVIDER_SITE_OTHER): Payer: Medicare Other | Admitting: Physician Assistant

## 2020-09-20 ENCOUNTER — Ambulatory Visit: Payer: Self-pay | Admitting: Physician Assistant

## 2020-09-20 ENCOUNTER — Encounter: Payer: Self-pay | Admitting: Physician Assistant

## 2020-09-20 VITALS — BP 108/63 | HR 73 | Ht 74.0 in

## 2020-09-20 DIAGNOSIS — R82998 Other abnormal findings in urine: Secondary | ICD-10-CM

## 2020-09-20 DIAGNOSIS — R339 Retention of urine, unspecified: Secondary | ICD-10-CM | POA: Diagnosis not present

## 2020-09-20 LAB — BLADDER SCAN AMB NON-IMAGING

## 2020-09-20 NOTE — Progress Notes (Unsigned)
Catheter Removal  Patient is present today for a catheter removal.  48ml of water was drained from the balloon. A 18FR coude foley cath was removed from the bladder no complications were noted . Patient tolerated well.  Performed by: Debroah Loop, PA-C   Follow up/ Additional notes: RTC this afternoon for PVR and repeat UA.

## 2020-09-20 NOTE — Progress Notes (Unsigned)
09/20/2020 9:44 AM   Bradley [name] [birth year] [id number]  CC: Chief Complaint  Patient presents with  . Urinary Retention    HPI: Bradley [name] is a 80 y.o. male with PMH BPH with prostatomegaly s/p UroLift in 2021 on dutasteride and Flomax and intermittent gross hematuria of likely prostatic origin with a recent history of urinary retention in the setting of [name] cystitis while admitted for management of acute diverticulitis with perforation who presents today for second outpatient voiding trial.  I saw him in clinic most recently on 09/10/2020, at which point his urine microscopy revealed RBC casts.  We plan to repeat UA today due to concerns for possible reader error.  Today he reports discomfort associated with his Foley catheter.  His gross hematuria has resolved.  He is curious how we will manage his urinary retention if he does not pass his voiding trial today.  Notably, he underwent cystoscopy with Dr. Bernardo Heater on 08/20/2020, which revealed significant prostate enlargement.  They discussed additional outlet surgery, however patient was undecided.  He was started on finasteride at that time.  In-office UA today positive for 2+ blood, 1+ protein, and 1+ leukocyte esterase; urine microscopy with >30 WBCs/HPF, 11-30 RBCs/HPF, and many bacteria. PVR 52mL.  PMH: Past Medical History:  Diagnosis Date  . Allergy   . Barrett's esophagus   . Blockage of coronary artery of heart ([place])   . BPH (benign prostatic hyperplasia)   . Cataracts, bilateral   . Chronic diastolic CHF (congestive heart failure), NYHA class 2 ([place]) 12/16/2014  . Colon polyp   . Coronary artery disease   . DDD (degenerative disc disease), lumbosacral 12/05/2017  . Diabetes mellitus without complication ([place])    usually running low. to see endocrinologist 01/2020   . Essential hypertension 06/05/2014  . GERD (gastroesophageal reflux disease)    Barretts esophagus  . GI bleed    a. ~2012 around time of hiatal  hernia surgery. 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  . Glaucoma   . Glaucoma   . Hemorrhoids   . History of chicken pox   . History of hiatal hernia 2012  . Hyperlipidemia   . Meningioma ([place])    stable, followed by yearly MRIs  . Migraines   . Neuromuscular disorder (HCC)    neuropathies of lower extrems  . RBBB    also, AF and bradycardia  . Stomach ulcer     Surgical History: Past Surgical History:  Procedure Laterality Date  . ATHERECTOMY  1992  . CARDIAC CATHETERIZATION N/A 01/07/2015   Procedure: Left Heart Cath;  Surgeon: Corey Skains, MD;  Location: [place] [facility];  Service: Cardiovascular;  Laterality: N/A;  . CARDIAC SURGERY     coronary angioplasty  . CHOLECYSTECTOMY    . COLONOSCOPY  11/03/08, 03/09/14  . CORONARY ANGIOPLASTY    . CORONARY ARTERY BYPASS GRAFT N/A 01/19/2015   Procedure: CORONARY ARTERY BYPASS GRAFTING (CABG), with LIM to LAD, vein to left intermediate, vein to PD, vein to OM, using right greater saphenous vein via endovein harvest.;  Surgeon: Grace Isaac, MD;  Location: [place];  Service: Open Heart Surgery;  Laterality: N/A;  . CYSTOSCOPY WITH INSERTION OF UROLIFT N/A 01/13/2020   Procedure: CYSTOSCOPY WITH INSERTION OF UROLIFT;  Surgeon: Abbie Sons, MD;  Location: [facility];  Service: Urology;  Laterality: N/A;  . DIAGNOSTIC LAPAROSCOPY     cholecystectomy and hiatal hernia repair  . ENDOVEIN HARVEST OF GREATER SAPHENOUS  VEIN Right 01/19/2015   Procedure: ENDOVEIN HARVEST OF GREATER SAPHENOUS VEIN;  Surgeon: Grace Isaac, MD;  Location: [facility];  Service: Open Heart Surgery;  Laterality: Right;  . ESOPHAGOGASTRODUODENOSCOPY  02/19/13  . ESOPHAGOGASTRODUODENOSCOPY (EGD) WITH PROPOFOL N/A 08/09/2015   Procedure: ESOPHAGOGASTRODUODENOSCOPY (EGD) WITH PROPOFOL;  Surgeon: Hulen Luster, MD;  [facility];  Service: Gastroenterology;  Laterality: N/A;  . ESOPHAGOGASTRODUODENOSCOPY (EGD) WITH PROPOFOL N/A  04/13/2017   Procedure: ESOPHAGOGASTRODUODENOSCOPY (EGD) WITH PROPOFOL;  Surgeon: Toledo, Benay Pike, MD;  Location: [facility];  Service: Endoscopy;  Laterality: N/A;  . ESOPHAGUS SURGERY    . EYE SURGERY     cataracts BIL  . HERNIA REPAIR    . HIATAL HERNIA REPAIR N/A   . POLYPECTOMY    . TEE WITHOUT CARDIOVERSION N/A 01/19/2015   Procedure: TRANSESOPHAGEAL ECHOCARDIOGRAM (TEE);  Surgeon: Grace Isaac, MD;  Location: [place];  Service: Open Heart Surgery;  Laterality: N/A;    Home Medications:  Allergies as of 09/20/2020      Reactions   Metoprolol Other (See Comments)   Hypotension   Sulfadiazine Other (See Comments)   unknown   Oxycodone Other (See Comments)   Reaction:  Hypotension    Sulfa Antibiotics Rash      Medication List       Accurate as of September 20, 2020  9:44 AM. If you have any questions, ask your nurse or doctor.        acetaminophen 500 MG tablet Commonly known as: TYLENOL Take 500 mg by mouth every 6 (six) hours as needed for mild pain or moderate pain.   aspirin EC 81 MG tablet Take 81 mg by mouth daily.   atorvastatin 40 MG tablet Commonly known as: LIPITOR TAKE ONE-HALF TABLET BY MOUTH QHS (ATBEDTIME)   Brinzolamide-Brimonidine 1-0.2 % Susp Place 1 drop into both eyes 2 (two) times daily.   carboxymethylcellulose 0.5 % Soln Commonly known as: REFRESH PLUS INSTILL 1 DROP IN EACH EYE FOUR TIMES A DAY AS NEEDED   cetirizine 10 MG tablet Commonly known as: ZYRTEC Take 10 mg by mouth daily.   chlorhexidine 0.12 % solution Commonly known as: PERIDEX Use as directed 15 mLs in the mouth or throat 2 (two) times daily as needed (for gum inflammation).   cyanocobalamin 2000 MCG tablet Take 2,000 mcg by mouth daily.   docusate sodium 100 MG capsule Commonly known as: COLACE Take 1 capsule (100 mg total) by mouth every 12 (twelve) hours.   dorzolamide-timolol 22.3-6.8 MG/ML ophthalmic solution Commonly known as: COSOPT Apply 1 drop to eye 2  (two) times daily.   dutasteride 0.5 MG capsule Commonly known as: AVODART Take 0.5 mg by mouth every other day.   eucerin cream Apply 1 application topically daily as needed for dry skin.   fluticasone 50 MCG/ACT nasal spray Commonly known as: FLONASE Place 2 sprays into both nostrils daily as needed for allergies.   hydrocortisone 2.5 % rectal cream Commonly known as: ANUSOL-HC Place 1 application rectally daily as needed for hemorrhoids.   hydrocortisone cream 1 % Apply 1 application topically 3 (three) times daily as needed for itching. Apply to external hemorrhoid x 7-10 days, then prn with reoccurrence   ketoconazole 2 % cream Commonly known as: NIZORAL Apply 1 application topically 2 (two) times daily.   latanoprost 0.005 % ophthalmic solution Commonly known as: XALATAN Place 1 drop into both eyes at bedtime.   Magnesium 250 MG Tabs Take 250 mg by mouth daily.  omeprazole 40 MG capsule Commonly known as: PRILOSEC Take 40 mg by mouth daily.   Polyethyl Glycol-Propyl Glycol 0.4-0.3 % Soln Place 1 drop into both eyes daily as needed for dry eyes.   pravastatin 20 MG tablet Commonly known as: PRAVACHOL Take 20 mg by mouth daily.   tamsulosin 0.4 MG Caps capsule Commonly known as: FLOMAX Take 1 capsule (0.4 mg total) by mouth daily.   timolol 0.25 % ophthalmic solution Commonly known as: TIMOPTIC Place 1 drop into both eyes daily.   triamcinolone 0.1 % Commonly known as: KENALOG Apply 1 application topically 2 (two) times daily as needed (Foot).       Allergies:  Allergies  Allergen Reactions  . Metoprolol Other (See Comments)    Hypotension  . Sulfadiazine Other (See Comments)    unknown  . Oxycodone Other (See Comments)    Reaction:  Hypotension   . Sulfa Antibiotics Rash    Family History: Family History  Problem Relation Age of Onset  . Parkinsonism Mother   . Kidney disease Father   . Cancer Father        prostate  . Heart disease  Father   . Cancer Paternal Uncle        esophageal  . Cancer Paternal Uncle     Social History:   reports that he has never smoked. He has never used smokeless tobacco. He reports that he does not drink alcohol and does not use drugs.  Physical Exam: BP 108/63 (BP Location: Left Arm, Patient Position: Sitting, Cuff Size: Large)   Pulse 73   Ht 6\' 2"  (1.88 m)   BMI 29.02 kg/m   Constitutional:  Alert and oriented, no acute distress, nontoxic appearing HEENT: [place], AT Cardiovascular: No clubbing, cyanosis, or edema Respiratory: Normal respiratory effort, no increased work of breathing Skin: No rashes, bruises or suspicious lesions Neurologic: Grossly intact, no focal deficits, moving all 4 extremities Psychiatric: Normal mood and affect  Laboratory Data: Results for orders placed or performed in visit on 09/20/20  CULTURE, URINE COMPREHENSIVE   Specimen: Urine   UR  Result Value Ref Range   Urine Culture, Comprehensive Final report (A)    Organism ID, Bacteria Escherichia coli (A)    ANTIMICROBIAL SUSCEPTIBILITY Comment   Microscopic Examination   Urine  Result Value Ref Range   WBC, UA >30 (A) 0 - 5 /hpf   RBC 11-30 (A) 0 - 2 /hpf   Epithelial Cells (non renal) 0-10 0 - 10 /hpf   Bacteria, UA Many (A) None seen/Few  Urinalysis, Complete  Result Value Ref Range   Specific Gravity, UA 1.020 1.005 - 1.030   pH, UA 5.5 5.0 - 7.5   Color, UA Yellow Yellow   Appearance Ur Cloudy (A) Clear   Leukocytes,UA 1+ (A) Negative   Protein,UA 1+ (A) Negative/Trace   Glucose, UA Negative Negative   Ketones, UA Negative Negative   RBC, UA 2+ (A) Negative   Bilirubin, UA Negative Negative   Urobilinogen, Ur 0.2 0.2 - 1.0 mg/dL   Nitrite, UA Negative Negative   Microscopic Examination See below:   Bladder Scan (Post Void Residual) in office  Result Value Ref Range   Scan Result 2mL    Assessment & Plan:   1. Urinary retention Foley catheter removed in the morning, see separate  procedure note for details.  Patient returned to clinic this afternoon for repeat PVR.  He reports drinking approximately 24 ounces of water.  He has been able  to urinate.  PVR 89 mL.  Voiding trial passed.  Patient has a follow-up appointment with Dr. Bernardo Heater scheduled in 4 months.  Counseled patient to keep this appointment and follow-up sooner as needed for recurrent retention or difficulty voiding.  He expressed understanding.  UA today consistent with recent catheterization.  Patient denies acute infective symptoms today.  Will send for culture but defer treatment unless he develops infective symptoms. - Bladder Scan (Post Void Residual) in office - CULTURE, URINE COMPREHENSIVE  2. 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  Will cancel previously placed nephrology consult due to likely reader error. - Urinalysis, Complete  Return if symptoms worsen or fail to improve.  Debroah Loop, PA-C  [facility] [address], [place], [place] [address] [phone]

## 2020-09-22 ENCOUNTER — Other Ambulatory Visit: Payer: Self-pay

## 2020-09-22 ENCOUNTER — Encounter: Payer: Self-pay | Admitting: Surgery

## 2020-09-22 ENCOUNTER — Ambulatory Visit (INDEPENDENT_AMBULATORY_CARE_PROVIDER_SITE_OTHER): Payer: Medicare Other | Admitting: Surgery

## 2020-09-22 VITALS — BP 145/76 | HR 73 | Temp 98.3°F | Ht 74.0 in | Wt 219.0 lb

## 2020-09-22 DIAGNOSIS — K5700 Diverticulitis of small intestine with perforation and abscess without bleeding: Secondary | ICD-10-CM

## 2020-09-22 LAB — CULTURE, URINE COMPREHENSIVE

## 2020-09-22 NOTE — Patient Instructions (Addendum)
Follow up with Dr Alice Reichert as scheduled and you should have a Colonoscopy done due to the diverticulitis.   Follow up here after you have your Colonoscopy in about 1.5-2 months.   You may eat a well rounded diet with fruits and vegetables. If you start to have any pain you may try changing to clear liquids for 1-2 days and then go back to regular diet if you improve. If you pain gets worse let us know.   You should take a fiber supplement daily like Benefiber or Metamucil. Be sure to drink plenty of fluids.

## 2020-09-23 ENCOUNTER — Telehealth: Payer: Self-pay

## 2020-09-23 LAB — URINALYSIS, COMPLETE
Bilirubin, UA: NEGATIVE
Glucose, UA: NEGATIVE
Ketones, UA: NEGATIVE
Nitrite, UA: NEGATIVE
Specific Gravity, UA: 1.02 (ref 1.005–1.030)
Urobilinogen, Ur: 0.2 mg/dL (ref 0.2–1.0)
pH, UA: 5.5 (ref 5.0–7.5)

## 2020-09-23 LAB — MICROSCOPIC EXAMINATION: WBC, UA: >30 /hpf — AB (ref 0–5)

## 2020-09-23 NOTE — Telephone Encounter (Signed)
-----   Message from [place], [place] sent at 09/22/2020  3:50 PM EST ----- Please contact the patient and ask if he's having any dysuria, urgency, frequency, lower abdominal pain, low back pain, fever, chills, nausea, or vomiting. In the absence of these, this result reflects asymptomatic bacteriuria in the setting of his recent catheterization and no treatment is required. ----- Message ----- From: Lavone Neri Lab Results In Sent: 09/22/2020   7:37 AM EST To: Debroah Loop, PA-C

## 2020-09-23 NOTE — Telephone Encounter (Signed)
Patient reports a milky looking appearance to his urine with a slight odor, otherwise he is symptom free. Patient also notes that since taking his catheter out, his stream has improved.

## 2020-09-24 ENCOUNTER — Encounter: Payer: Self-pay | Admitting: Surgery

## 2020-09-24 NOTE — Progress Notes (Signed)
Outpatient Surgical Follow Up  09/24/2020  Bradley [name] is an 80 y.o. male.   Chief Complaint  Patient presents with  . Diverticulitis    HPI: There is an 80 year old male well-known to me with a recent episode of complicated diverticulitis with microperforation abscess.  Bradley [name] was admitted to the hospital require parenteral antibiotics.  Bradley [name] responded very well to antibiotic therapy.  Bradley [name] does have significant hearing impairment but Bradley [name] is very functional.  Bradley [name] is accompanied by his wife today.  States of occasional pain in the left lower quadrant.  Bradley [name] does have a history of urinary retention and just had his Foley removed a few days ago.  Bradley [name] feels that his pain is better now.  Bradley [name] still drives and is independent.  Bradley [name] did have a CABG several years ago.  No recent chest pains and no evidence of dyspnea on exertion or angina.  Bradley [name] also had a prior hiatal hernia repair  And upper scopic cholecystectomy.  Have personally reviewed the repeat CT scan that was performed in the hospital with diverticulitis microperforation and abscess.  Past Medical History:  Diagnosis Date  . Allergy   . Barrett's esophagus   . Blockage of coronary artery of heart ([place])   . BPH (benign prostatic hyperplasia)   . Cataracts, bilateral   . Chronic diastolic CHF (congestive heart failure), NYHA class 2 ([place]) 12/16/2014  . Colon polyp   . Coronary artery disease   . DDD (degenerative disc disease), lumbosacral 12/05/2017  . Diabetes mellitus without complication ([place])    usually running low. to see endocrinologist 01/2020   . Essential hypertension 06/05/2014  . GERD (gastroesophageal reflux disease)    Barretts esophagus  . GI bleed    a. ~2012 around time of hiatal hernia surgery. 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  . Glaucoma   . Glaucoma   . Hemorrhoids   . History of chicken pox   . History of hiatal hernia 2012  . Hyperlipidemia   . Meningioma ([place])    stable, followed by yearly MRIs   . Migraines   . Neuromuscular disorder (HCC)    neuropathies of lower extrems  . RBBB    also, AF and bradycardia  . Stomach ulcer     Past Surgical History:  Procedure Laterality Date  . ATHERECTOMY  1992  . CARDIAC CATHETERIZATION N/A 01/07/2015   Procedure: Left Heart Cath;  Surgeon: Corey Skains, MD;  Location: [place] [facility];  Service: Cardiovascular;  Laterality: N/A;  . CARDIAC SURGERY     coronary angioplasty  . CHOLECYSTECTOMY    . COLONOSCOPY  11/03/08, 03/09/14  . CORONARY ANGIOPLASTY    . CORONARY ARTERY BYPASS GRAFT N/A 01/19/2015   Procedure: CORONARY ARTERY BYPASS GRAFTING (CABG), with LIM to LAD, vein to left intermediate, vein to PD, vein to OM, using right greater saphenous vein via endovein harvest.;  Surgeon: Grace Isaac, MD;  Location: [place];  Service: Open Heart Surgery;  Laterality: N/A;  . CYSTOSCOPY WITH INSERTION OF UROLIFT N/A 01/13/2020   Procedure: CYSTOSCOPY WITH INSERTION OF UROLIFT;  Surgeon: Abbie Sons, MD;  Location: [facility];  Service: Urology;  Laterality: N/A;  . DIAGNOSTIC LAPAROSCOPY     cholecystectomy and hiatal hernia repair  . ENDOVEIN HARVEST OF GREATER SAPHENOUS VEIN Right 01/19/2015   Procedure: ENDOVEIN HARVEST OF GREATER SAPHENOUS VEIN;  Surgeon: Grace Isaac, MD;  Location: [place];  Service: Open Heart Surgery;  Laterality: Right;  .  ESOPHAGOGASTRODUODENOSCOPY  02/19/13  . ESOPHAGOGASTRODUODENOSCOPY (EGD) WITH PROPOFOL N/A 08/09/2015   Procedure: ESOPHAGOGASTRODUODENOSCOPY (EGD) WITH PROPOFOL;  Surgeon: Hulen Luster, MD;  Location: [facility];  Service: Gastroenterology;  Laterality: N/A;  . ESOPHAGOGASTRODUODENOSCOPY (EGD) WITH PROPOFOL N/A 04/13/2017   Procedure: ESOPHAGOGASTRODUODENOSCOPY (EGD) WITH PROPOFOL;  Surgeon: Toledo, Benay Pike, MD;  Location: [facility];  Service: Endoscopy;  Laterality: N/A;  . ESOPHAGUS SURGERY    . EYE SURGERY     cataracts BIL  . HERNIA REPAIR    . HIATAL HERNIA REPAIR N/A   .  POLYPECTOMY    . TEE WITHOUT CARDIOVERSION N/A 01/19/2015   Procedure: TRANSESOPHAGEAL ECHOCARDIOGRAM (TEE);  Surgeon: Grace Isaac, MD;  Location: [place];  Service: Open Heart Surgery;  Laterality: N/A;    Family History  Problem Relation Age of Onset  . Parkinsonism Mother   . Kidney disease Father   . Cancer Father        prostate  . Heart disease Father   . Cancer Paternal Uncle        esophageal  . Cancer Paternal Uncle     Social History:  reports that Bradley [name] has never smoked. Bradley [name] has never used smokeless tobacco. Bradley [name] reports that Bradley [name] does not drink alcohol and does not use drugs.  Allergies:  Allergies  Allergen Reactions  . Metoprolol Other (See Comments)    Hypotension  . Sulfadiazine Other (See Comments)    unknown  . Oxycodone Other (See Comments)    Reaction:  Hypotension   . Sulfa Antibiotics Rash    Medications reviewed.    ROS Full ROS performed and is otherwise negative other than what is stated in HPI   BP (!) 145/76   Pulse 73   Temp 98.3 F (36.8 C)   Ht 6\' 2"  (1.88 m)   Wt 219 lb (99.3 kg)   SpO2 98%   BMI 28.12 kg/m   Physical Exam Vitals and nursing note reviewed. Exam conducted with a chaperone present.  Constitutional:      General: Bradley [name] is not in acute distress.    Appearance: Normal appearance. Bradley [name] is normal weight.  Eyes:     General: No scleral icterus.       Right eye: No discharge.        Left eye: No discharge.  Cardiovascular:     Rate and Rhythm: Normal rate and regular rhythm.     Heart sounds: No murmur heard.   Pulmonary:     Effort: Pulmonary effort is normal. No respiratory distress.     Breath sounds: Normal breath sounds. No stridor.  Abdominal:     General: Abdomen is flat. There is no distension.     Palpations: Abdomen is soft. There is no mass.     Tenderness: There is no abdominal tenderness. There is no guarding or rebound.     Hernia: No hernia is present.  Musculoskeletal:        General: No swelling or  tenderness. Normal range of motion.     Cervical back: Normal range of motion and neck supple. No rigidity or tenderness.  Lymphadenopathy:     Cervical: No cervical adenopathy.  Skin:    General: Skin is warm and dry.     Capillary Refill: Capillary refill takes less than 2 seconds.  Neurological:     General: No focal deficit present.     Mental Status: Bradley [name] is alert and oriented to person, place, and time.  Psychiatric:  Mood and Affect: Mood normal.        Behavior: Behavior normal.        Thought Content: Thought content normal.        Judgment: Judgment normal.      Assessment/Plan:  1. Diverticulitis of small intestine with perforation without bleeding Discussed with patient in detail.  Bradley [name] has clinically improved and there is no evidence of complication.  I would like to refer him to gastroenterology for colonoscopy.  Is been ordered for years since Bradley [name] had a colonoscopy and at that time showed polyps.  Recent episode of diverticulitis Bradley [name] definitely needs 1.  Also discussed with him in detail about options Bradley [name] is an octogenarian  but at the same time Bradley [name] is at the same time very functional 1.  Usually for an  episode of complicated diverticulitis we do recommend sigmoid colectomy.  Again this is individualized decision at least I would like to make sure that the evaluate the colon intraluminally to rule out any potential cancerous lesions.  I will see after Bradley [name] completes his colonoscopy.  There is no need for emergent surgical intervention at this time His Gastroenterologist is Dr. Alice Reichert.  We will make sure that Bradley [name] visits him to get   Greater than 50% of the 40 minutes  visit was spent in counseling/coordination of care   Caroleen Hamman, MD [place] Surgeon

## 2020-11-11 ENCOUNTER — Other Ambulatory Visit: Payer: Self-pay

## 2020-11-11 ENCOUNTER — Ambulatory Visit
Admission: RE | Admit: 2020-11-11 | Discharge: 2020-11-11 | Disposition: A | Discharge status: Home / Self Care | Payer: Medicare Other | Source: Ambulatory Visit | Attending: Internal Medicine | Admitting: Internal Medicine

## 2020-11-11 DIAGNOSIS — R1312 Dysphagia, oropharyngeal phase: Secondary | ICD-10-CM | POA: Diagnosis present

## 2020-11-11 NOTE — Therapy (Signed)
[place], [place], [address] Phone: [phone]   Fax:     Modified Barium Swallow  Patient Details  Name: Bradley [name] MRN: [id number] Date of Birth: November 01, 1940 No data recorded  Encounter Date: 11/11/2020   End of Session - 11/11/20 1313    Visit Number 1    Number of Visits 1    Date for SLP Re-Evaluation 11/11/20    SLP Start Time 5009    SLP Stop Time  3818    SLP Time Calculation (min) 20 min    Activity Tolerance Patient tolerated treatment well            There were no vitals filed for this visit.   Subjective Assessment - 11/11/20 1313    Subjective "It doesn't really happen when I'm eating."    Currently in Pain? No/denies            Objective Swallowing Evaluation: Type of Study: MBS-Modified Barium Swallow Study   Patient Details  Name: Bradley [name] MRN: [id number] Date of Birth: 1941/05/05  Today's Date: 11/11/2020 Time: SLP Start Time (ACUTE ONLY): 7893 -SLP Stop Time (ACUTE ONLY): 8101  SLP Time Calculation (min) (ACUTE ONLY): 20 min   Past Medical History:  Past Medical History:  Diagnosis Date  . Allergy   . Barrett's esophagus   . Blockage of coronary artery of heart ([place])   . BPH (benign prostatic hyperplasia)   . Cataracts, bilateral   . Chronic diastolic CHF (congestive heart failure), NYHA class 2 ([place]) 12/16/2014  . Colon polyp   . Coronary artery disease   . DDD (degenerative disc disease), lumbosacral 12/05/2017  . Diabetes mellitus without complication ([place])    usually running low. to see endocrinologist 01/2020   . Essential hypertension 06/05/2014  . GERD (gastroesophageal reflux disease)    Barretts esophagus  . GI bleed    a. ~2012 around time of hiatal hernia surgery. 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  . Glaucoma   . Glaucoma   . Hemorrhoids   . History of chicken pox   . History of hiatal hernia 2012  .  Hyperlipidemia   . Meningioma ([place])    stable, followed by yearly MRIs  . Migraines   . Neuromuscular disorder (HCC)    neuropathies of lower extrems  . RBBB    also, AF and bradycardia  . Stomach ulcer    Past Surgical History:  Past Surgical History:  Procedure Laterality Date  . ATHERECTOMY  1992  . CARDIAC CATHETERIZATION N/A 01/07/2015   Procedure: Left Heart Cath;  Surgeon: Corey Skains, MD;  Location: [facility];  Service: Cardiovascular;  Laterality: N/A;  . CARDIAC SURGERY     coronary angioplasty  . CHOLECYSTECTOMY    . COLONOSCOPY  11/03/08, 03/09/14  . CORONARY ANGIOPLASTY    . CORONARY ARTERY BYPASS GRAFT N/A 01/19/2015   Procedure: CORONARY ARTERY BYPASS GRAFTING (CABG), with LIM to LAD, vein to left intermediate, vein to PD, vein to OM, using right greater saphenous vein via endovein harvest.;  Surgeon: Grace Isaac, MD;  Location: [facility];  Service: Open Heart Surgery;  Laterality: N/A;  . CYSTOSCOPY WITH INSERTION OF UROLIFT N/A 01/13/2020   Procedure: CYSTOSCOPY WITH INSERTION OF UROLIFT;  Surgeon: Abbie Sons, MD;  Location: [facility];  Service: Urology;  Laterality: N/A;  . DIAGNOSTIC LAPAROSCOPY     cholecystectomy and hiatal hernia repair  .  ENDOVEIN HARVEST OF GREATER SAPHENOUS VEIN Right 01/19/2015   Procedure: ENDOVEIN HARVEST OF GREATER SAPHENOUS VEIN;  Surgeon: Grace Isaac, MD;  Location: [facility];  Service: Open Heart Surgery;  Laterality: Right;  . ESOPHAGOGASTRODUODENOSCOPY  02/19/13  . ESOPHAGOGASTRODUODENOSCOPY (EGD) WITH PROPOFOL N/A 08/09/2015   Procedure: ESOPHAGOGASTRODUODENOSCOPY (EGD) WITH PROPOFOL;  Surgeon: Hulen Luster, MD;  Location: [facility];  Service: Gastroenterology;  Laterality: N/A;  . ESOPHAGOGASTRODUODENOSCOPY (EGD) WITH PROPOFOL N/A 04/13/2017   Procedure: ESOPHAGOGASTRODUODENOSCOPY (EGD) WITH PROPOFOL;  Surgeon: Toledo, Benay Pike, MD;  Location: [facility];  Service: Endoscopy;  Laterality: N/A;  . ESOPHAGUS  SURGERY    . EYE SURGERY     cataracts BIL  . HERNIA REPAIR    . HIATAL HERNIA REPAIR N/A   . POLYPECTOMY    . TEE WITHOUT CARDIOVERSION N/A 01/19/2015   Procedure: TRANSESOPHAGEAL ECHOCARDIOGRAM (TEE);  Surgeon: Grace Isaac, MD;  Location: [facility];  Service: Open Heart Surgery;  Laterality: N/A;   HPI: Bradley [name] is an 80 year old male referred by Dr. Alice Reichert for MBS to evaluate dysphagia. His primary concern is "strangling" on his saliva when he leans his head back on the couch. Sometimes he "gets strangled" with solids, and indicates a globus feeling, points mid-sternum. He has an MBS in 2019 with findings of mild delay and mild residue, with no penetration or aspiration seen. Barium Swallow was performed on 10/03/19 with findings of stable moderate narrowing of the cervical esophagus, widely patent thoracic esophagus, no evidence of hiatal hernia or reflux. Past medical history is noted for Barrett's esophagus, benign brain tumor, CAD, CHF, GERD, s/p CABG x4. Recent hospitalization in January 2022 for hematuria, acute diverticulitis.   Subjective: Pt is concerned about "strangling" on his saliva when he leans his head back while sitting on the couch    Assessment / Plan / Recommendation  CHL IP CLINICAL IMPRESSIONS 11/11/2020  Clinical Impression Patient presents with oropharyngeal swallowing appearing grossly within functional limits given normative age-related changes in swallow function. Oral stage is within normal limits, with adequate strength, coordination and timely anterior to posterior transfer. Swallow initiation occurs at the level of the valleculae with solids, and pyriform sinuses with liquids. At pt's age this is functional, given adequate airway protection. There is no laryngeal penetration or aspiration. Epiglottic deflection is complete. Pharyngeal phase is characterized by adequate tongue base retraction, hyolaryngeal excursion, and pharyngeal constriction. Mild narrowing  of the cervical esophagus is noted on prior imaging; this does not significantly impact bolus clearance, however pressure differences vs pharyngeal weakness likely contribute to minimal pharyngeal residue post-swallow in valleculae and post-cricoid regions. Educated pt after the assessment using imaging to demonstrate that his swallowing is safe and he appears to have minimal risk for aspiration. If he is concerned about coughing on his saliva when reclining his head, he could consider using a pillow or elevate his head to swallow in neutral position. He reports he only eats and drinks in upright, head neutral position and SLP encouraged him to continue this as general aspiration precaution. No further ST is indicated.  SLP Visit Diagnosis Dysphagia, unspecified (R13.10)  Attention and concentration deficit following --  Frontal lobe and executive function deficit following --  Impact on safety and function No limitations      CHL IP TREATMENT RECOMMENDATION 11/11/2020  Treatment Recommendations No treatment recommended at this time     No flowsheet data found.  CHL IP DIET RECOMMENDATION 11/11/2020  SLP Diet Recommendations Regular  solids;Thin liquid  Liquid Administration via Cup  Medication Administration Whole meds with liquid  Compensations Slow rate;Small sips/bites;Other (Comment);Follow solids with liquid  Postural Changes Seated upright at 90 degrees;Remain semi-upright after after feeds/meals (Comment)      CHL IP OTHER RECOMMENDATIONS 11/11/2020  Recommended Consults --  Oral Care Recommendations Oral care BID  Other Recommendations --      CHL IP FOLLOW UP RECOMMENDATIONS 11/11/2020  Follow up Recommendations None      No flowsheet data found.         CHL IP ORAL PHASE 11/11/2020  Oral Phase WFL    CHL IP PHARYNGEAL PHASE 11/11/2020  Pharyngeal Phase WFL  Pharyngeal- Nectar Cup Delayed swallow initiation-pyriform sinuses;Pharyngeal residue - valleculae  Pharyngeal  Material does not enter airway  Pharyngeal- Thin Cup Delayed swallow initiation-pyriform sinuses;Pharyngeal residue - valleculae  Pharyngeal Material does not enter airway  Pharyngeal- Puree Delayed swallow initiation-vallecula;Pharyngeal residue - valleculae  Pharyngeal Material does not enter airway  Pharyngeal- Mechanical Soft Delayed swallow initiation-vallecula;Pharyngeal residue - valleculae  Pharyngeal Material does not enter airway     CHL IP CERVICAL ESOPHAGEAL PHASE 11/11/2020  Cervical Esophageal Phase [facility]    Deneise Lever, MS, CCC-SLP Speech-Language Pathologist  Aliene Altes 11/11/2020, 1:42 PM                        Oropharyngeal dysphagia - Plan: DG SWALLOW FUNC OP MEDICARE SPEECH PATH, DG SWALLOW FUNC OP MEDICARE SPEECH PATH        Problem List Patient Active Problem List   Diagnosis Date Noted  . Perforated diverticulum 08/29/2020  . Diverticulitis of intestine with perforation 08/28/2020  . Bradycardia 02/13/2019  . Meningioma ([place]) 05/30/2018  . External hemorrhoids 04/25/2018  . Incontinence of feces 04/25/2018  . DDD (degenerative disc disease), cervical 03/07/2018  . Glaucoma (increased eye pressure) 12/19/2017  . DDD (degenerative disc disease), lumbosacral 12/05/2017  . Urinary frequency 06/21/2017  . Urinary urgency 06/21/2017  . Bilateral carotid artery stenosis 04/11/2017  . Atrial fibrillation ([place]) 01/30/2015  . 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 Stable angina ([place]) 01/07/2015  . Chronic diastolic CHF (congestive heart failure), NYHA class 2 ([place]) 12/16/2014  . Arteriosclerosis of coronary artery 12/10/2014  . GERD (gastroesophageal reflux disease) 06/16/2014  . Benign essential HTN 06/05/2014  . Mixed hyperlipidemia 06/05/2014  . Moderate mitral insufficiency 06/05/2014  . Chest pain 01/12/2014  . Angina pectoris ([place]) 10/09/2013  . 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 Esophageal disease 10/09/2013  . Benign prostatic  hyperplasia with urinary obstruction 04/01/2012  . 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 ED (erectile dysfunction) of organic origin 04/01/2012  . Elevated prostate specific antigen (PSA) 04/01/2012  . Benign neoplasm of cerebral meninges ([place]) 04/24/2011    Aliene Altes 11/11/2020, 1:41 PM  [facility] [place], [place], [address] Phone: [phone]   Fax:     Name: ZINEDINE ELLNER MRN: [id number] Date of Birth: 1941-02-14

## 2020-11-15 ENCOUNTER — Ambulatory Visit: Payer: Medicare Other | Admitting: Surgery

## 2020-12-17 ENCOUNTER — Other Ambulatory Visit: Payer: Self-pay | Admitting: Neurology

## 2020-12-17 ENCOUNTER — Encounter: Payer: Self-pay | Admitting: Urology

## 2020-12-17 ENCOUNTER — Ambulatory Visit (INDEPENDENT_AMBULATORY_CARE_PROVIDER_SITE_OTHER): Payer: Medicare Other | Admitting: Urology

## 2020-12-17 ENCOUNTER — Other Ambulatory Visit: Payer: Self-pay

## 2020-12-17 VITALS — BP 156/77 | HR 64 | Ht 74.0 in | Wt 214.0 lb

## 2020-12-17 DIAGNOSIS — R413 Other amnesia: Secondary | ICD-10-CM

## 2020-12-17 DIAGNOSIS — R300 Dysuria: Secondary | ICD-10-CM

## 2020-12-17 DIAGNOSIS — N401 Enlarged prostate with lower urinary tract symptoms: Secondary | ICD-10-CM | POA: Diagnosis not present

## 2020-12-17 DIAGNOSIS — R8281 Pyuria: Secondary | ICD-10-CM | POA: Diagnosis not present

## 2020-12-17 LAB — BLADDER SCAN AMB NON-IMAGING: Scan Result: 48

## 2020-12-17 NOTE — Progress Notes (Signed)
12/17/2020 11:26 AM   Bradley [name] 17-May-1941 [id number]  Referring provider: Sofie Hartigan, MD [place],  [place] [address]  Chief Complaint  Patient presents with  . Benign Prostatic Hypertrophy    HPI: 80 y.o. male presents for follow-up.   Shortly after last visit he was admitted for acute diverticulitis with perforation which was managed medically  He did have urinary retention while hospitalized and a subsequent successful voiding trial  Presently with weak urinary stream, frequency and urgency  On tamsulosin and finasteride  Hematuria has improved on finasteride  Scheduled for a colonoscopy at the end of this month   PMH: Past Medical History:  Diagnosis Date  . Allergy   . Barrett's esophagus   . Blockage of coronary artery of heart ([place])   . BPH (benign prostatic hyperplasia)   . Cataracts, bilateral   . Chronic diastolic CHF (congestive heart failure), NYHA class 2 ([place]) 12/16/2014  . Colon polyp   . Coronary artery disease   . DDD (degenerative disc disease), lumbosacral 12/05/2017  . Diabetes mellitus without complication ([place])    usually running low. to see endocrinologist 01/2020   . Essential hypertension 06/05/2014  . GERD (gastroesophageal reflux disease)    Barretts esophagus  . GI bleed    a. ~2012 around time of hiatal hernia surgery. 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  . Glaucoma   . Glaucoma   . Hemorrhoids   . History of chicken pox   . History of hiatal hernia 2012  . Hyperlipidemia   . Meningioma ([place])    stable, followed by yearly MRIs  . Migraines   . Neuromuscular disorder (HCC)    neuropathies of lower extrems  . RBBB    also, AF and bradycardia  . Stomach ulcer     Surgical History: Past Surgical History:  Procedure Laterality Date  . ATHERECTOMY  1992  . CARDIAC CATHETERIZATION N/A 01/07/2015   Procedure: Left Heart Cath;  Surgeon: Corey Skains, MD;  Location: [facility];  Service: Cardiovascular;  Laterality: N/A;  . CARDIAC SURGERY     coronary angioplasty  . CHOLECYSTECTOMY    . COLONOSCOPY  11/03/08, 03/09/14  . CORONARY ANGIOPLASTY    . CORONARY ARTERY BYPASS GRAFT N/A 01/19/2015   Procedure: CORONARY ARTERY BYPASS GRAFTING (CABG), with LIM to LAD, vein to left intermediate, vein to PD, vein to OM, using right greater saphenous vein via endovein harvest.;  Surgeon: Grace Isaac, MD;  Location: [facility];  Service: Open Heart Surgery;  Laterality: N/A;  . CYSTOSCOPY WITH INSERTION OF UROLIFT N/A 01/13/2020   Procedure: CYSTOSCOPY WITH INSERTION OF UROLIFT;  Surgeon: Abbie Sons, MD;  Location: [facility];  Service: Urology;  Laterality: N/A;  . DIAGNOSTIC LAPAROSCOPY     cholecystectomy and hiatal hernia repair  . ENDOVEIN HARVEST OF GREATER SAPHENOUS VEIN Right 01/19/2015   Procedure: ENDOVEIN HARVEST OF GREATER SAPHENOUS VEIN;  Surgeon: Grace Isaac, MD;  Location: [facility];  Service: Open Heart Surgery;  Laterality: Right;  . ESOPHAGOGASTRODUODENOSCOPY  02/19/13  . ESOPHAGOGASTRODUODENOSCOPY (EGD) WITH PROPOFOL N/A 08/09/2015   Procedure: ESOPHAGOGASTRODUODENOSCOPY (EGD) WITH PROPOFOL;  Surgeon: Hulen Luster, MD;  Location: [facility];  Service: Gastroenterology;  Laterality: N/A;  . ESOPHAGOGASTRODUODENOSCOPY (EGD) WITH PROPOFOL N/A 04/13/2017   Procedure: ESOPHAGOGASTRODUODENOSCOPY (EGD) WITH PROPOFOL;  Surgeon: Toledo, Benay Pike, MD;  Location: [facility];  Service: Endoscopy;  Laterality: N/A;  . ESOPHAGUS SURGERY    .  EYE SURGERY     cataracts BIL  . HERNIA REPAIR    . HIATAL HERNIA REPAIR N/A   . POLYPECTOMY    . TEE WITHOUT CARDIOVERSION N/A 01/19/2015   Procedure: TRANSESOPHAGEAL ECHOCARDIOGRAM (TEE);  Surgeon: Grace Isaac, MD;  Location: [facility];  Service: Open Heart Surgery;  Laterality: N/A;    Home Medications:  Allergies as of 12/17/2020      Reactions   Metoprolol Other (See Comments)   Hypotension   Sulfadiazine  Other (See Comments)   unknown   Oxycodone Other (See Comments)   Reaction:  Hypotension    Sulfa Antibiotics Rash      Medication List       Accurate as of December 17, 2020 11:26 AM. If you have any questions, ask your nurse or doctor.        acetaminophen 500 MG tablet Commonly known as: TYLENOL Take 500 mg by mouth every 6 (six) hours as needed for mild pain or moderate pain.   aspirin EC 81 MG tablet Take 81 mg by mouth daily.   atorvastatin 40 MG tablet Commonly known as: LIPITOR TAKE ONE-HALF TABLET BY MOUTH QHS (ATBEDTIME)   Brinzolamide-Brimonidine 1-0.2 % Susp Place 1 drop into both eyes 2 (two) times daily.   carboxymethylcellulose 0.5 % Soln Commonly known as: REFRESH PLUS INSTILL 1 DROP IN EACH EYE FOUR TIMES A DAY AS NEEDED   cetirizine 10 MG tablet Commonly known as: ZYRTEC Take 10 mg by mouth daily.   chlorhexidine 0.12 % solution Commonly known as: PERIDEX Use as directed 15 mLs in the mouth or throat 2 (two) times daily as needed (for gum inflammation).   cyanocobalamin 2000 MCG tablet Take 2,000 mcg by mouth daily.   docusate sodium 100 MG capsule Commonly known as: COLACE Take 1 capsule (100 mg total) by mouth every 12 (twelve) hours.   dorzolamide-timolol 22.3-6.8 MG/ML ophthalmic solution Commonly known as: COSOPT Apply 1 drop to eye 2 (two) times daily.   dutasteride 0.5 MG capsule Commonly known as: AVODART Take 0.5 mg by mouth every other day.   eucerin cream Apply 1 application topically daily as needed for dry skin.   finasteride 5 MG tablet Commonly known as: PROSCAR Take by mouth.   fluticasone 50 MCG/ACT nasal spray Commonly known as: FLONASE Place 2 sprays into both nostrils daily as needed for allergies.   hydrocortisone 2.5 % rectal cream Commonly known as: ANUSOL-HC Place 1 application rectally daily as needed for hemorrhoids.   hydrocortisone cream 1 % Apply 1 application topically 3 (three) times daily as needed for  itching. Apply to external hemorrhoid x 7-10 days, then prn with reoccurrence   ketoconazole 2 % cream Commonly known as: NIZORAL Apply 1 application topically 2 (two) times daily.   latanoprost 0.005 % ophthalmic solution Commonly known as: XALATAN Place 1 drop into both eyes at bedtime.   Magnesium 250 MG Tabs Take 250 mg by mouth daily.   omeprazole 40 MG capsule Commonly known as: PRILOSEC Take 40 mg by mouth daily.   Polyethyl Glycol-Propyl Glycol 0.4-0.3 % Soln Place 1 drop into both eyes daily as needed for dry eyes.   pravastatin 20 MG tablet Commonly known as: PRAVACHOL Take 20 mg by mouth daily.   rivastigmine 1.5 MG capsule Commonly known as: EXELON Take by mouth.   timolol 0.25 % ophthalmic solution Commonly known as: TIMOPTIC Place 1 drop into both eyes daily.   triamcinolone cream 0.1 % Commonly known as: KENALOG  Apply 1 application topically 2 (two) times daily as needed (Foot).       Allergies:  Allergies  Allergen Reactions  . Metoprolol Other (See Comments)    Hypotension  . Sulfadiazine Other (See Comments)    unknown  . Oxycodone Other (See Comments)    Reaction:  Hypotension   . Sulfa Antibiotics Rash    Family History: Family History  Problem Relation Age of Onset  . Parkinsonism Mother   . Kidney disease Father   . Cancer Father        prostate  . Heart disease Father   . Cancer Paternal Uncle        esophageal  . Cancer Paternal Uncle     Social History:  reports that he has never smoked. He has never used smokeless tobacco. He reports that he does not drink alcohol and does not use drugs.   Physical Exam: BP (!) 156/77   Pulse 64   Ht 6\' 2"  (1.88 m)   Wt 214 lb (97.1 kg)   BMI 27.48 kg/m   Constitutional:  Alert and oriented, No acute distress. HEENT: [place] AT, moist mucus membranes.  Trachea midline, no masses. Cardiovascular: No clubbing, cyanosis, or edema. Respiratory: Normal respiratory effort, no increased work  of breathing.   Assessment & Plan:    1. Benign prostatic hyperplasia with lower urinary tract symptoms, symptom details unspecified  Bothersome lower urinary tract symptoms  Prostate volume on recent CT calculated at 160 g and feel he would benefit from HoLEP.  The procedure was discussed.  He is interested in pursuing but wants to make sure there are no significant findings with colonoscopy at the end this month  We will schedule a follow-up with Dr. Erlene Quan or Dr. Diamantina [place] to discuss HoLEP further in mid July  Bladder scan PVR today 48 mL  2.  Dysuria  Recently with mild dysuria and urinalysis with >30 WBC/3-10 RBC and many bacteria  Urine culture was ordered and since he is only having minimal symptoms will await culture results prior to antibiotic treatment    Abbie Sons, [place] [address] [place], [place] [address] [phone]

## 2020-12-18 LAB — URINALYSIS, COMPLETE
Bilirubin, UA: NEGATIVE
Glucose, UA: NEGATIVE
Ketones, UA: NEGATIVE
Nitrite, UA: NEGATIVE
Protein,UA: NEGATIVE
Specific Gravity, UA: 1.015 (ref 1.005–1.030)
Urobilinogen, Ur: 0.2 mg/dL (ref 0.2–1.0)
pH, UA: 5.5 (ref 5.0–7.5)

## 2020-12-18 LAB — MICROSCOPIC EXAMINATION: WBC, UA: >30 /hpf — AB (ref 0–5)

## 2020-12-20 ENCOUNTER — Other Ambulatory Visit: Payer: Medicare Other

## 2020-12-20 ENCOUNTER — Telehealth: Payer: Self-pay | Admitting: *Deleted

## 2020-12-20 ENCOUNTER — Other Ambulatory Visit: Payer: Self-pay

## 2020-12-20 DIAGNOSIS — R300 Dysuria: Secondary | ICD-10-CM

## 2020-12-20 NOTE — Telephone Encounter (Signed)
-----   Message from Abbie Sons, MD sent at 12/19/2020 10:47 AM EDT ----- Please let patient know urine culture did not get ordered.  Have him bring by a urine for culture at his convenience

## 2020-12-20 NOTE — Telephone Encounter (Signed)
Notified patient as instructed, patient pleased. Discussed follow-up appointments, patient agrees  

## 2020-12-21 LAB — MICROSCOPIC EXAMINATION: WBC, UA: >30 /hpf — AB (ref 0–5)

## 2020-12-21 LAB — URINALYSIS, COMPLETE
Bilirubin, UA: NEGATIVE
Glucose, UA: NEGATIVE
Ketones, UA: NEGATIVE
Nitrite, UA: NEGATIVE
Protein,UA: NEGATIVE
Specific Gravity, UA: 1.015 (ref 1.005–1.030)
Urobilinogen, Ur: 0.2 mg/dL (ref 0.2–1.0)
pH, UA: 6.5 (ref 5.0–7.5)

## 2020-12-23 ENCOUNTER — Telehealth: Payer: Self-pay

## 2020-12-23 NOTE — Telephone Encounter (Signed)
Notified patient as instructed.   

## 2020-12-23 NOTE — Telephone Encounter (Signed)
We only have a preliminary report without antibiotic sensitivity

## 2020-12-23 NOTE — Telephone Encounter (Signed)
Pt called asked for urine culture resuts. He is going out of town this afternoon

## 2020-12-25 LAB — CULTURE, URINE COMPREHENSIVE

## 2020-12-26 ENCOUNTER — Other Ambulatory Visit: Payer: Self-pay | Admitting: Urology

## 2020-12-26 MED ORDER — DOXYCYCLINE HYCLATE 100 MG PO CAPS: 100.0000 mg | ORAL_CAPSULE | Freq: Two times a day (BID) | ORAL | 0 refills | Status: DC

## 2020-12-27 ENCOUNTER — Telehealth: Payer: Self-pay | Admitting: *Deleted

## 2020-12-27 MED ORDER — DOXYCYCLINE HYCLATE 100 MG PO CAPS: 100.0000 mg | ORAL_CAPSULE | Freq: Two times a day (BID) | ORAL | 0 refills | Status: AC

## 2020-12-27 NOTE — Telephone Encounter (Signed)
Notified patient as instructed, patient pleased. Discussed follow-up appointments, patient agrees  

## 2020-12-27 NOTE — Telephone Encounter (Signed)
-----   Message from Abbie Sons, MD sent at 12/26/2020 12:35 PM EDT ----- Urine culture was positive.  Would recommend treatment to see if symptoms improve.  Antibiotic Rx was sent to pharmacy.

## 2020-12-30 ENCOUNTER — Ambulatory Visit: Payer: Medicare Other

## 2021-01-05 ENCOUNTER — Encounter: Payer: Self-pay | Admitting: Emergency Medicine

## 2021-01-05 ENCOUNTER — Emergency Department
Admission: EM | Admit: 2021-01-05 | Discharge: 2021-01-05 | Disposition: A | Discharge status: Home / Self Care | Payer: Medicare Other | Attending: Student in an Organized Health Care Education/Training Program | Admitting: Student in an Organized Health Care Education/Training Program

## 2021-01-05 ENCOUNTER — Other Ambulatory Visit: Payer: Self-pay

## 2021-01-05 ENCOUNTER — Emergency Department: Payer: Medicare Other

## 2021-01-05 DIAGNOSIS — I2510 Atherosclerotic heart disease of native coronary artery without angina pectoris: Secondary | ICD-10-CM | POA: Insufficient documentation

## 2021-01-05 DIAGNOSIS — I5032 Chronic diastolic (congestive) heart failure: Secondary | ICD-10-CM | POA: Insufficient documentation

## 2021-01-05 DIAGNOSIS — Z7982 Long term (current) use of aspirin: Secondary | ICD-10-CM | POA: Insufficient documentation

## 2021-01-05 DIAGNOSIS — Z951 Presence of aortocoronary bypass graft: Secondary | ICD-10-CM | POA: Insufficient documentation

## 2021-01-05 DIAGNOSIS — R1013 Epigastric pain: Secondary | ICD-10-CM | POA: Insufficient documentation

## 2021-01-05 DIAGNOSIS — R0789 Other chest pain: Secondary | ICD-10-CM | POA: Insufficient documentation

## 2021-01-05 DIAGNOSIS — K219 Gastro-esophageal reflux disease without esophagitis: Secondary | ICD-10-CM | POA: Diagnosis not present

## 2021-01-05 DIAGNOSIS — Z8546 Personal history of malignant neoplasm of prostate: Secondary | ICD-10-CM | POA: Insufficient documentation

## 2021-01-05 DIAGNOSIS — Z79899 Other long term (current) drug therapy: Secondary | ICD-10-CM | POA: Insufficient documentation

## 2021-01-05 DIAGNOSIS — I110 Hypertensive heart disease with heart failure: Secondary | ICD-10-CM | POA: Diagnosis not present

## 2021-01-05 DIAGNOSIS — R079 Chest pain, unspecified: Secondary | ICD-10-CM | POA: Diagnosis present

## 2021-01-05 LAB — BASIC METABOLIC PANEL
Anion gap: 6 (ref 5–15)
BUN: 26 mg/dL — ABNORMAL HIGH (ref 8–23)
CO2: 25 mmol/L (ref 22–32)
Calcium: 10.2 mg/dL (ref 8.9–10.3)
Chloride: 108 mmol/L (ref 98–111)
Creatinine, Ser: 1.52 mg/dL — ABNORMAL HIGH (ref 0.61–1.24)
GFR, Estimated: 46 mL/min — ABNORMAL LOW (ref >60)
Glucose, Bld: 96 mg/dL (ref 70–99)
Potassium: 4.1 mmol/L (ref 3.5–5.1)
Sodium: 139 mmol/L (ref 135–145)

## 2021-01-05 LAB — CBC
HCT: 45.5 % (ref 39.0–52.0)
Hemoglobin: 15.3 g/dL (ref 13.0–17.0)
MCH: 31.9 pg (ref 26.0–34.0)
MCHC: 33.6 g/dL (ref 30.0–36.0)
MCV: 94.8 fL (ref 80.0–100.0)
Platelets: 225 10*3/uL (ref 150–400)
RBC: 4.8 MIL/uL (ref 4.22–5.81)
RDW: 13.7 % (ref 11.5–15.5)
WBC: 7.6 10*3/uL (ref 4.0–10.5)
nRBC: 0 % (ref 0.0–0.2)

## 2021-01-05 LAB — TROPONIN I (HIGH SENSITIVITY)
Troponin I (High Sensitivity): 6 ng/L (ref <18)
Troponin I (High Sensitivity): 6 ng/L (ref <18)

## 2021-01-05 NOTE — ED Provider Notes (Signed)
[facility] Emergency Department Provider Note    Event Date/Time   First MD Initiated Contact with Patient 01/05/21 1356     (approximate)  I have reviewed the triage vital signs and the nursing notes.   HISTORY  Chief Complaint Chest Pain    HPI Bradley [name] is a 80 y.o. male extensive past medical history as listed below presents to the ER for evaluation of intermittent brief episodes of epigastric pain for the past 12 hours and then had an episode where he had some left arm pain.  Prompting him come to the ER.  No exertional pain or discomfort.  Does have a history of reflux and heartburn and feels like this was somewhat similar to but the arm discomfort is new.  Denies any injury.  No fevers.  No diaphoresis.  No pain radiating through to his back.  No pain or discomfort with deep inspiration.  No shortness of breath. this more   Past Medical History:  Diagnosis Date   Allergy    Barrett's esophagus    Blockage of coronary artery of heart (HCC)    BPH (benign prostatic hyperplasia)    Cataracts, bilateral    Chronic diastolic CHF (congestive heart failure), NYHA class 2 ([place]) 12/16/2014   Colon polyp    Coronary artery disease    DDD (degenerative disc disease), lumbosacral 12/05/2017   Diabetes mellitus without complication ([place])    usually running low. to see endocrinologist 01/2020    Essential hypertension 06/05/2014   GERD (gastroesophageal reflux disease)    Barretts esophagus   GI bleed    a. ~2012 around time of hiatal hernia surgery. 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   Glaucoma    Glaucoma    Hemorrhoids    History of chicken pox    History of hiatal hernia 2012   Hyperlipidemia    Meningioma (HCC)    stable, followed by yearly MRIs   Migraines    Neuromuscular disorder (HCC)    neuropathies of lower extrems   RBBB    also, AF and bradycardia   Stomach ulcer    Family History  Problem Relation Age  of Onset   Parkinsonism Mother    Kidney disease Father    Cancer Father        prostate   Heart disease Father    Cancer Paternal Uncle        esophageal   Cancer Paternal Uncle    Past Surgical History:  Procedure Laterality Date   ATHERECTOMY  1992   CARDIAC CATHETERIZATION N/A 01/07/2015   Procedure: Left Heart Cath;  Surgeon: Corey Skains, MD;  Location: [place] [facility];  Service: Cardiovascular;  Laterality: N/A;   CARDIAC SURGERY     coronary angioplasty   CHOLECYSTECTOMY     COLONOSCOPY  11/03/08, 03/09/14   CORONARY ANGIOPLASTY     CORONARY ARTERY BYPASS GRAFT N/A 01/19/2015   Procedure: CORONARY ARTERY BYPASS GRAFTING (CABG), with LIM to LAD, vein to left intermediate, vein to PD, vein to OM, using right greater saphenous vein via endovein harvest.;  Surgeon: Grace Isaac, MD;  Location: [place];  Service: Open Heart Surgery;  Laterality: N/A;   CYSTOSCOPY WITH INSERTION OF UROLIFT N/A 01/13/2020   Procedure: CYSTOSCOPY WITH INSERTION OF UROLIFT;  Surgeon: Abbie Sons, MD;  Location: [facility];  Service: Urology;  Laterality: N/A;   DIAGNOSTIC LAPAROSCOPY     cholecystectomy and hiatal hernia repair  ENDOVEIN HARVEST OF GREATER SAPHENOUS VEIN Right 01/19/2015   Procedure: ENDOVEIN HARVEST OF GREATER SAPHENOUS VEIN;  Surgeon: Grace Isaac, MD;  Location: [place];  Service: Open Heart Surgery;  Laterality: Right;   ESOPHAGOGASTRODUODENOSCOPY  02/19/13   ESOPHAGOGASTRODUODENOSCOPY (EGD) WITH PROPOFOL N/A 08/09/2015   Procedure: ESOPHAGOGASTRODUODENOSCOPY (EGD) WITH PROPOFOL;  Surgeon: Hulen Luster, MD;  Location: [facility];  Service: Gastroenterology;  Laterality: N/A;   ESOPHAGOGASTRODUODENOSCOPY (EGD) WITH PROPOFOL N/A 04/13/2017   Procedure: ESOPHAGOGASTRODUODENOSCOPY (EGD) WITH PROPOFOL;  Surgeon: [place], Benay Pike, MD;  Location: [facility];  Service: Endoscopy;  Laterality: N/A;   ESOPHAGUS SURGERY     EYE SURGERY     cataracts BIL   HERNIA REPAIR      HIATAL HERNIA REPAIR N/A    POLYPECTOMY     TEE WITHOUT CARDIOVERSION N/A 01/19/2015   Procedure: TRANSESOPHAGEAL ECHOCARDIOGRAM (TEE);  Surgeon: Grace Isaac, MD;  Location: [place];  Service: Open Heart Surgery;  Laterality: N/A;   Patient Active Problem List   Diagnosis Date Noted   Perforated diverticulum 08/29/2020   Diverticulitis of intestine with perforation 08/28/2020   Bradycardia 02/13/2019   Meningioma ([place]) 05/30/2018   External hemorrhoids 04/25/2018   Incontinence of feces 04/25/2018   DDD (degenerative disc disease), cervical 03/07/2018   Glaucoma (increased eye pressure) 12/19/2017   DDD (degenerative disc disease), lumbosacral 12/05/2017   Urinary frequency 06/21/2017   Urinary urgency 06/21/2017   Bilateral carotid artery stenosis 04/11/2017   Atrial fibrillation ([place]) 01/30/2015   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   Stable angina (HCC) 01/07/2015   Chronic diastolic CHF (congestive heart failure), NYHA class 2 ([place]) 12/16/2014   Arteriosclerosis of coronary artery 12/10/2014   GERD (gastroesophageal reflux disease) 06/16/2014   Benign essential HTN 06/05/2014   Mixed hyperlipidemia 06/05/2014   Moderate mitral insufficiency 06/05/2014   Chest pain 01/12/2014   Angina pectoris ([place]) 10/09/2013   Benign neoplasm of large bowel 10/09/2013   Esophageal disease 10/09/2013   Benign prostatic hyperplasia with urinary obstruction 04/01/2012   CA in situ prostate 04/01/2012   ED (erectile dysfunction) of organic origin 04/01/2012   Elevated prostate specific antigen (PSA) 04/01/2012   Benign neoplasm of cerebral meninges ([place]) 04/24/2011      Prior to Admission medications   Medication Sig Start Date End Date Taking? Authorizing Provider  acetaminophen (TYLENOL) 500 MG tablet Take 500 mg by mouth every 6 (six) hours as needed for mild pain or moderate pain.    [provider]  aspirin EC 81 MG tablet Take 81 mg by mouth daily.    [provider]   atorvastatin (LIPITOR) 40 MG tablet TAKE ONE-HALF TABLET BY MOUTH QHS (ATBEDTIME) 06/24/10   [provider]  Brinzolamide-Brimonidine 1-0.2 % SUSP Place 1 drop into both eyes 2 (two) times daily. 04/08/20   [provider]  carboxymethylcellulose (REFRESH PLUS) 0.5 % SOLN INSTILL 1 DROP IN [facility] EYE FOUR TIMES A DAY AS NEEDED 07/29/20   [provider]  cetirizine (ZYRTEC) 10 MG tablet Take 10 mg by mouth daily.     [provider]  chlorhexidine (PERIDEX) 0.12 % solution Use as directed 15 mLs in the mouth or throat 2 (two) times daily as needed (for gum inflammation).     [provider]  cyanocobalamin 2000 MCG tablet Take 2,000 mcg by mouth daily.     [provider]  docusate sodium (COLACE) 100 MG capsule Take 1 capsule (100 mg total) by mouth every 12 (  twelve) hours. 01/18/20   Rodriguez-Southworth, Sunday Spillers, PA-C  dorzolamide-timolol (COSOPT) 22.3-6.8 MG/ML ophthalmic solution Apply 1 drop to eye 2 (two) times daily. 09/26/19   [provider]  doxycycline (VIBRAMYCIN) 100 MG capsule Take 1 capsule (100 mg total) by mouth 2 (two) times daily for 14 days. 12/27/20 01/10/21  Abbie Sons, MD  finasteride (PROSCAR) 5 MG tablet Take by mouth.    [provider]  fluticasone (FLONASE) 50 MCG/ACT nasal spray Place 2 sprays into both nostrils daily as needed for allergies. 05/28/20   [provider]  hydrocortisone (ANUSOL-HC) 2.5 % rectal cream Place 1 application rectally daily as needed for hemorrhoids.  02/10/19   [provider]  hydrocortisone cream 1 % Apply 1 application topically 3 (three) times daily as needed for itching. Apply to external hemorrhoid x 7-10 days, then prn with reoccurrence 01/18/20   Rodriguez-Southworth, Sunday Spillers, PA-C  ketoconazole (NIZORAL) 2 % cream Apply 1 application topically 2 (two) times daily. 07/01/20   [provider]  latanoprost (XALATAN) 0.005 % ophthalmic solution Place  1 drop into both eyes at bedtime.    [provider]  Magnesium 250 MG TABS Take 250 mg by mouth daily.     [provider]  omeprazole (PRILOSEC) 40 MG capsule Take 40 mg by mouth daily. 08/16/20   [provider]  Polyethyl Glycol-Propyl Glycol 0.4-0.3 % SOLN Place 1 drop into both eyes daily as needed for dry eyes.    [provider]  pravastatin (PRAVACHOL) 20 MG tablet Take 20 mg by mouth daily.    [provider]  rivastigmine (EXELON) 1.5 MG capsule Take by mouth. 12/17/20 12/17/21  [provider]  Skin Protectants, Misc. (EUCERIN) cream Apply 1 application topically daily as needed for dry skin.    [provider]  timolol (TIMOPTIC) 0.25 % ophthalmic solution Place 1 drop into both eyes daily.    [provider]  triamcinolone cream (KENALOG) 0.1 % Apply 1 application topically 2 (two) times daily as needed (Foot).    [provider]    Allergies Metoprolol, Sulfadiazine, Oxycodone, and Sulfa antibiotics    Social History Social History   Tobacco Use   Smoking status: Never   Smokeless tobacco: Never  Vaping Use   Vaping Use: Never used  Substance Use Topics   Alcohol use: No    Alcohol/week: 0.0 standard drinks   Drug use: No    Review of Systems Patient denies headaches, rhinorrhea, blurry vision, numbness, shortness of breath, chest pain, edema, cough, abdominal pain, nausea, vomiting, diarrhea, dysuria, fevers, rashes or hallucinations unless otherwise stated above in HPI. ____________________________________________   PHYSICAL EXAM:  VITAL SIGNS: Vitals:   01/05/21 1305 01/05/21 1626  BP: (!) 155/86 (!) 150/88  Pulse: 78 (!) 109  Resp: 18 18  Temp: 97.6 F (36.4 C)   SpO2: 100% 100%    Constitutional: Alert and oriented.  Eyes: Conjunctivae are normal.  Head: Atraumatic. Nose: No congestion/rhinnorhea. Mouth/Throat: Mucous membranes are moist.   Neck: No stridor. Painless  ROM.  Cardiovascular: Normal rate, regular rhythm. Grossly normal heart sounds.  Good peripheral circulation. Respiratory: Normal respiratory effort.  No retractions. Lungs CTAB. Gastrointestinal: Soft and nontender. No distention. No abdominal bruits. No CVA tenderness. Genitourinary:  Musculoskeletal: No lower extremity tenderness nor edema.  No joint effusions. Neurologic:  Normal speech and language. No gross focal neurologic deficits are appreciated. No facial droop Skin:  Skin is warm, dry and intact. No rash noted. Psychiatric:  Mood and affect are normal. Speech and behavior are normal.  ____________________________________________   LABS (all labs ordered are listed, but only abnormal results are displayed)  Results for orders placed or performed during the hospital encounter of 01/05/21 (from the past 24 hour(s))  Basic metabolic panel     Status: Abnormal   Collection Time: 01/05/21  1:08 PM  Result Value Ref Range   Sodium 139 135 - 145 mmol/L   Potassium 4.1 3.5 - 5.1 mmol/L   Chloride 108 98 - 111 mmol/L   CO2 25 22 - 32 mmol/L   Glucose, Bld 96 70 - 99 mg/dL   BUN 26 (H) 8 - 23 mg/dL   Creatinine, Ser 1.52 (H) 0.61 - 1.24 mg/dL   Calcium 10.2 8.9 - 10.3 mg/dL   GFR, Estimated 46 (L) >60 mL/min   Anion gap 6 5 - 15  CBC     Status: None   Collection Time: 01/05/21  1:08 PM  Result Value Ref Range   WBC 7.6 4.0 - 10.5 K/uL   RBC 4.80 4.22 - 5.81 MIL/uL   Hemoglobin 15.3 13.0 - 17.0 g/dL   HCT 45.5 39.0 - 52.0 %   MCV 94.8 80.0 - 100.0 fL   MCH 31.9 26.0 - 34.0 pg   MCHC 33.6 30.0 - 36.0 g/dL   RDW 13.7 11.5 - 15.5 %   Platelets 225 150 - 400 K/uL   nRBC 0.0 0.0 - 0.2 %  Troponin I (High Sensitivity)     Status: None   Collection Time: 01/05/21  1:08 PM  Result Value Ref Range   Troponin I (High Sensitivity) 6 <18 ng/L  Troponin I (High Sensitivity)     Status: None   Collection Time: 01/05/21  3:44 PM  Result Value Ref Range   Troponin I (High Sensitivity)  6 <18 ng/L   ____________________________________________  EKG My review and personal interpretation at Time: 13:00   Indication: chest pain  Rate: 80  Rhythm: sinus Axis: left Other: rbbb, no stemi, nonspecific st abn, consistent with previous tracings ____________________________________________  RADIOLOGY  I personally reviewed all radiographic images ordered to evaluate for the above acute complaints and reviewed radiology reports and findings.  These findings were personally discussed with the patient.  Please see medical record for radiology report.  ____________________________________________   PROCEDURES  Procedure(s) performed:  Procedures    Critical Care performed: no ____________________________________________   INITIAL IMPRESSION / ASSESSMENT AND PLAN / ED COURSE  Pertinent labs & imaging results that were available during my care of the patient were reviewed by me and considered in my medical decision making (see chart for details).   DDX: ACS, pericarditis, esophagitis, boerhaaves, pe, dissection, pna, bronchitis, costochondritis   Bradley [name] is a 80 y.o. who presents to the ED with intermittent brief episodes of chest pain as described above.  Seems atypical for ACS but patient with history of CABG.  Does not seem consistent with dissection or PE.  No evidence of pneumonia.  No wheezing on exam.  Patient pain-free at this time.  Has a history of indigestion and feels it might be related but is somewhat different.  His EKG is consistent with previous.  His troponins are negative.  As he has been chest pain-free since being in the ER do believe he is appropriate for close outpatient follow-up.  Patient states he is eager to be discharged agrees to follow-up closely with cardiology.  Discussed signs and symptoms which she should  return to the ER.     The patient was evaluated in Emergency Department today for the symptoms described in the history of present  illness. He/she was evaluated in the context of the global COVID-19 pandemic, which necessitated consideration that the patient might be at risk for infection with the SARS-CoV-2 virus that causes COVID-19. Institutional protocols and algorithms that pertain to the evaluation of patients at risk for COVID-19 are in a state of rapid change based on information released by regulatory bodies including the CDC and federal and state organizations. These policies and algorithms were followed during the patient's care in the ED.  As part of my medical decision making, I reviewed the following data within the [facility] notes reviewed and incorporated, Labs reviewed, notes from prior ED visits and [place] Controlled Substance Database   ____________________________________________   FINAL CLINICAL IMPRESSION(S) / ED DIAGNOSES  Final diagnoses:  Atypical chest pain      NEW MEDICATIONS STARTED DURING THIS VISIT:  Discharge Medication List as of 01/05/2021  4:28 PM       Note:  This document was prepared using Dragon voice recognition software and may include unintentional dictation errors.    Merlyn Lot, MD 01/05/21 [phone]

## 2021-01-05 NOTE — ED Triage Notes (Signed)
Pt comes into the ED via POV c/o central chesty pain that radiates into the left arm.  Pt has h/o CABG in the past.  PT takes ASA daily but denies any other blood thinner use.  PT denies any SHOB, dizziness, or nausea.  PT ambulatory to triage at this time with even and unlabored respirations.

## 2021-01-09 ENCOUNTER — Other Ambulatory Visit: Payer: Self-pay

## 2021-01-09 ENCOUNTER — Ambulatory Visit
Admission: RE | Admit: 2021-01-09 | Discharge: 2021-01-09 | Disposition: A | Discharge status: Home / Self Care | Payer: Medicare Other | Source: Ambulatory Visit | Attending: Neurology | Admitting: Neurology

## 2021-01-09 DIAGNOSIS — R413 Other amnesia: Secondary | ICD-10-CM | POA: Diagnosis present

## 2021-01-09 MED ORDER — GADOBUTROL 1 MMOL/ML IV SOLN
9.0000 mL | Freq: Once | INTRAVENOUS | Status: AC | PRN
  Administered 2021-01-09: 10 mL via INTRAVENOUS

## 2021-01-11 ENCOUNTER — Encounter: Payer: Self-pay | Admitting: Internal Medicine

## 2021-01-12 ENCOUNTER — Ambulatory Visit
Admission: RE | Admit: 2021-01-12 | Discharge: 2021-01-12 | Disposition: A | Discharge status: Home / Self Care | Payer: Medicare Other | Attending: Internal Medicine | Admitting: Internal Medicine

## 2021-01-12 ENCOUNTER — Encounter: Payer: Self-pay | Admitting: Internal Medicine

## 2021-01-12 ENCOUNTER — Ambulatory Visit: Payer: Medicare Other | Admitting: Anesthesiology

## 2021-01-12 ENCOUNTER — Encounter
Admission: RE | Disposition: A | Discharge status: Home / Self Care | Payer: Self-pay | Source: Home / Self Care | Attending: Internal Medicine

## 2021-01-12 DIAGNOSIS — I5032 Chronic diastolic (congestive) heart failure: Secondary | ICD-10-CM | POA: Diagnosis not present

## 2021-01-12 DIAGNOSIS — K5730 Diverticulosis of large intestine without perforation or abscess without bleeding: Secondary | ICD-10-CM | POA: Diagnosis not present

## 2021-01-12 DIAGNOSIS — Z7982 Long term (current) use of aspirin: Secondary | ICD-10-CM | POA: Insufficient documentation

## 2021-01-12 DIAGNOSIS — E1139 Type 2 diabetes mellitus with other diabetic ophthalmic complication: Secondary | ICD-10-CM | POA: Diagnosis not present

## 2021-01-12 DIAGNOSIS — Z79899 Other long term (current) drug therapy: Secondary | ICD-10-CM | POA: Diagnosis not present

## 2021-01-12 DIAGNOSIS — R933 Abnormal findings on diagnostic imaging of other parts of digestive tract: Secondary | ICD-10-CM | POA: Insufficient documentation

## 2021-01-12 DIAGNOSIS — H42 Glaucoma in diseases classified elsewhere: Secondary | ICD-10-CM | POA: Insufficient documentation

## 2021-01-12 DIAGNOSIS — K640 First degree hemorrhoids: Secondary | ICD-10-CM | POA: Insufficient documentation

## 2021-01-12 DIAGNOSIS — Z888 Allergy status to other drugs, medicaments and biological substances status: Secondary | ICD-10-CM | POA: Insufficient documentation

## 2021-01-12 DIAGNOSIS — Z882 Allergy status to sulfonamides status: Secondary | ICD-10-CM | POA: Diagnosis not present

## 2021-01-12 DIAGNOSIS — I110 Hypertensive heart disease with heart failure: Secondary | ICD-10-CM | POA: Diagnosis not present

## 2021-01-12 DIAGNOSIS — D125 Benign neoplasm of sigmoid colon: Secondary | ICD-10-CM | POA: Insufficient documentation

## 2021-01-12 DIAGNOSIS — Z885 Allergy status to narcotic agent status: Secondary | ICD-10-CM | POA: Diagnosis not present

## 2021-01-12 HISTORY — PX: COLONOSCOPY WITH PROPOFOL: SHX5780

## 2021-01-12 LAB — GLUCOSE, CAPILLARY: Glucose-Capillary: 82 mg/dL (ref 70–99)

## 2021-01-12 SURGERY — COLONOSCOPY WITH PROPOFOL
Anesthesia: General

## 2021-01-12 MED ORDER — PROPOFOL 500 MG/50ML IV EMUL
INTRAVENOUS | Status: DC | PRN
  Administered 2021-01-12: 125 ug/kg/min via INTRAVENOUS

## 2021-01-12 MED ORDER — PROPOFOL 500 MG/50ML IV EMUL
INTRAVENOUS | Status: AC
  Filled 2021-01-12: qty 50

## 2021-01-12 MED ORDER — LIDOCAINE HCL (CARDIAC) PF 100 MG/5ML IV SOSY
PREFILLED_SYRINGE | INTRAVENOUS | Status: DC | PRN
  Administered 2021-01-12: 50 mg via INTRAVENOUS

## 2021-01-12 MED ORDER — PROPOFOL 10 MG/ML IV BOLUS
INTRAVENOUS | Status: DC | PRN
  Administered 2021-01-12: 60 mg via INTRAVENOUS
  Administered 2021-01-12: 10 mg via INTRAVENOUS

## 2021-01-12 MED ORDER — SODIUM CHLORIDE 0.9 % IV SOLN
INTRAVENOUS | Status: DC
  Administered 2021-01-12: 20 mL/h via INTRAVENOUS

## 2021-01-12 NOTE — Transfer of Care (Signed)
Immediate Anesthesia Transfer of Care Note  Patient: Bradley [name]  Procedure(s) Performed: COLONOSCOPY WITH PROPOFOL  Patient Location: PACU  Anesthesia Type:MAC  Level of Consciousness: drowsy  Airway & Oxygen Therapy: Patient Spontanous Breathing  Post-op Assessment: Report given to RN and Post -op Vital signs reviewed and stable  Post vital signs: stable  Last Vitals:  Vitals Value Taken Time  BP    Temp    Pulse    Resp    SpO2      Last Pain:  Vitals:   01/12/21 1030  TempSrc: Temporal  PainSc: 0-No pain         Complications: No notable events documented.

## 2021-01-12 NOTE — Op Note (Signed)
[facility]astroenterology Patient Name: Bradley [name] Procedure Date: 01/12/2021 12:08 PM MRN: [id number] Account #: [id number] Date of Birth: [birth year] Admit Type: Outpatient Age: 80 Room: [facility] ENDO ROOM 4 Gender: Male Note Status: Finalized Procedure:             Colonoscopy Indications:           Abnormal CT of the GI tract, Follow-up of                         diverticulitis Providers:             Benay Pike. Alice Reichert MD, MD Referring MD:          Sofie Hartigan (Referring MD) Medicines:             Propofol per Anesthesia Complications:         No immediate complications. Procedure:             Pre-Anesthesia Assessment:                        - The risks and benefits of the procedure and the                         sedation options and risks were discussed with the                         patient. All questions were answered and informed                         consent was obtained.                        - Patient identification and proposed procedure were                         verified prior to the procedure by the nurse. The                         procedure was verified in the procedure room.                        - ASA Grade Assessment: III - A patient with severe                         systemic disease.                        - After reviewing the risks and benefits, the patient                         was deemed in satisfactory condition to undergo the                         procedure.                        After obtaining informed consent, the colonoscope was                         passed under direct vision. Throughout the procedure,  the patient's blood pressure, pulse, and oxygen                         saturations were monitored continuously. The                         Colonoscope was introduced through the anus and                         advanced to the the cecum, identified by appendiceal                          orifice and ileocecal valve. The colonoscopy was                         performed without difficulty. The patient tolerated                         the procedure well. The quality of the bowel                         preparation was adequate. Findings:      The perianal and digital rectal examinations were normal. Pertinent       negatives include normal sphincter tone and no palpable rectal lesions.      Non-bleeding internal hemorrhoids were found during retroflexion. The       hemorrhoids were Grade I (internal hemorrhoids that do not prolapse).      A 6 mm polyp was found in the sigmoid colon. The polyp was sessile. The       polyp was removed with a hot snare. Resection and retrieval were       complete.      Multiple small and large-mouthed diverticula were found in the sigmoid       colon.      The exam was otherwise without abnormality. Impression:            - Non-bleeding internal hemorrhoids.                        - One 6 mm polyp in the sigmoid colon, removed with a                         hot snare. Resected and retrieved.                        - Diverticulosis in the sigmoid colon.                        - The examination was otherwise normal. Recommendation:        - Patient has a contact number available for                         emergencies. The signs and symptoms of potential                         delayed complications were discussed with the patient.                         Return to normal  activities tomorrow. Written                         discharge instructions were provided to the patient.                        - Resume previous diet.                        - Continue present medications.                        - If polyps are benign or adenomatous without                         dysplasia, I will advise NO further colonoscopy due to                         advanced age and/or severe comorbidity.                        - Return to GI office PRN.                         - The findings and recommendations were discussed with                         the patient. Procedure Code(s):     --- Professional ---                        [phone], Colonoscopy, flexible; with removal of                         tumor(s), polyp(s), or other lesion(s) by snare                         technique Diagnosis Code(s):     --- Professional ---                        R93.3, Abnormal findings on diagnostic imaging of                         other parts of digestive tract                        K57.30, Diverticulosis of large intestine without                         perforation or abscess without bleeding                        K57.32, Diverticulitis of large intestine without                         perforation or abscess without bleeding                        K64.0, First degree hemorrhoids                        K63.5, Polyp of colon  CPT copyright 2019 American Medical Association. All rights reserved. The codes documented in this report are preliminary and upon coder review may  be revised to meet current compliance requirements. Efrain Sella MD, MD 01/12/2021 12:41:48 PM This report has been signed electronically. Number of Addenda: 0 Note Initiated On: 01/12/2021 12:08 PM Scope Withdrawal Time: 0 hours 5 minutes 43 seconds  Total Procedure Duration: 0 hours 13 minutes 21 seconds  Estimated Blood Loss:  Estimated blood loss: none.      [facility]

## 2021-01-12 NOTE — Anesthesia Postprocedure Evaluation (Signed)
Anesthesia Post Note  Patient: Bradley [name]  Procedure(s) Performed: COLONOSCOPY WITH PROPOFOL  Patient location during evaluation: Endoscopy Anesthesia Type: General Level of consciousness: awake and alert Pain management: pain level controlled Vital Signs Assessment: post-procedure vital signs reviewed and stable Respiratory status: spontaneous breathing, nonlabored ventilation, respiratory function stable and patient connected to nasal cannula oxygen Cardiovascular status: blood pressure returned to baseline and stable Postop Assessment: no apparent nausea or vomiting Anesthetic complications: no   No notable events documented.   Last Vitals:  Vitals:   01/12/21 1301 01/12/21 1311  BP: (!) 141/92 (!) 156/107  Pulse: (!) 57 62  Resp: 12 14  Temp:    SpO2: 99% 100%    Last Pain:  Vitals:   01/12/21 1311  TempSrc:   PainSc: 0-No pain                 Precious Haws Yamilet Mcfayden

## 2021-01-12 NOTE — H&P (Signed)
Outpatient short stay form Pre-procedure 01/12/2021 12:13 PM Bradley [name] K. Alice Reichert, M.D.  Primary Physician: Thereasa Distance, M.D.  Reason for visit:  Follow up diverticulitis, LLQ pain  History of present illness:  80 y/o male recently evaluated for diverticulitis with abnormal CT scan of the abdomen presents for diagnostic evaluation prior to any possible surgical resection in the future. Currently, Patient denies change in bowel habits, rectal bleeding, weight loss or abdominal pain.      Current Facility-Administered Medications:    0.9 %  sodium chloride infusion, , Intravenous, Continuous, Rutledge, Benay Pike, MD, Last Rate: 20 mL/hr at 01/12/21 1208, Continued from Pre-op at 01/12/21 1208  Medications Prior to Admission  Medication Sig Dispense Refill Last Dose   acetaminophen (TYLENOL) 500 MG tablet Take 500 mg by mouth every 6 (six) hours as needed for mild pain or moderate pain.   Past Week   aspirin EC 81 MG tablet Take 81 mg by mouth daily.   Past Week   atorvastatin (LIPITOR) 40 MG tablet TAKE ONE-HALF TABLET BY MOUTH QHS (ATBEDTIME)   Past Week   Brinzolamide-Brimonidine 1-0.2 % SUSP Place 1 drop into both eyes 2 (two) times daily.   Past Week   carboxymethylcellulose (REFRESH PLUS) 0.5 % SOLN INSTILL 1 DROP IN [facility] EYE FOUR TIMES A DAY AS NEEDED   Past Week   cetirizine (ZYRTEC) 10 MG tablet Take 10 mg by mouth daily.    Past Week   chlorhexidine (PERIDEX) 0.12 % solution Use as directed 15 mLs in the mouth or throat 2 (two) times daily as needed (for gum inflammation).    Past Week   cyanocobalamin 2000 MCG tablet Take 2,000 mcg by mouth daily.    Past Week   docusate sodium (COLACE) 100 MG capsule Take 1 capsule (100 mg total) by mouth every 12 (twelve) hours. 14 capsule 0 Past Week   dorzolamide-timolol (COSOPT) 22.3-6.8 MG/ML ophthalmic solution Apply 1 drop to eye 2 (two) times daily.   Past Week   finasteride (PROSCAR) 5 MG tablet Take by mouth.   Past Week   fluticasone  (FLONASE) 50 MCG/ACT nasal spray Place 2 sprays into both nostrils daily as needed for allergies.   Past Week   hydrocortisone (ANUSOL-HC) 2.5 % rectal cream Place 1 application rectally daily as needed for hemorrhoids.    Past Week   hydrocortisone cream 1 % Apply 1 application topically 3 (three) times daily as needed for itching. Apply to external hemorrhoid x 7-10 days, then prn with reoccurrence 120 g 0 Past Week   ketoconazole (NIZORAL) 2 % cream Apply 1 application topically 2 (two) times daily.   Past Week   latanoprost (XALATAN) 0.005 % ophthalmic solution Place 1 drop into both eyes at bedtime.   Past Week   Magnesium 250 MG TABS Take 250 mg by mouth daily.    Past Week   omeprazole (PRILOSEC) 40 MG capsule Take 40 mg by mouth daily.   Past Week   Polyethyl Glycol-Propyl Glycol 0.4-0.3 % SOLN Place 1 drop into both eyes daily as needed for dry eyes.   Past Week   pravastatin (PRAVACHOL) 20 MG tablet Take 20 mg by mouth daily.   Past Week   rivastigmine (EXELON) 1.5 MG capsule Take by mouth.   Past Week   Skin Protectants, Misc. (EUCERIN) cream Apply 1 application topically daily as needed for dry skin.   Past Week   tamsulosin (FLOMAX) 0.4 MG CAPS capsule Take 0.4 mg by mouth.  Past Week   timolol (TIMOPTIC) 0.25 % ophthalmic solution Place 1 drop into both eyes daily.   Past Week   triamcinolone cream (KENALOG) 0.1 % Apply 1 application topically 2 (two) times daily as needed (Foot).   Past Week     Allergies  Allergen Reactions   Metoprolol Other (See Comments)    Hypotension   Sulfadiazine Other (See Comments)    unknown   Oxycodone Other (See Comments)    Reaction:  Hypotension    Sulfa Antibiotics Rash     Past Medical History:  Diagnosis Date   Allergy    Barrett's esophagus    Blockage of coronary artery of heart (HCC)    BPH (benign prostatic hyperplasia)    Cataracts, bilateral    Chronic diastolic CHF (congestive heart failure), NYHA class 2 ([place]) 12/16/2014    Colon polyp    Coronary artery disease    DDD (degenerative disc disease), lumbosacral 12/05/2017   Diabetes mellitus without complication ([place])    usually running low. to see endocrinologist 01/2020    Essential hypertension 06/05/2014   GERD (gastroesophageal reflux disease)    Barretts esophagus   GI bleed    a. ~2012 around time of hiatal hernia surgery. 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   Glaucoma    Glaucoma    Hemorrhoids    History of chicken pox    History of hiatal hernia 2012   Hyperlipidemia    Meningioma (HCC)    stable, followed by yearly MRIs   Migraines    Neuromuscular disorder ([place])    neuropathies of lower extrems   RBBB    also, AF and bradycardia   Stomach ulcer     Review of systems:  Otherwise negative.    Physical Exam  Gen: Alert, oriented. Appears stated age.  HEENT: [place]/AT. PERRLA. Lungs: CTA, no wheezes. CV: RR nl S1, S2. Abd: soft, benign, no masses. BS+ Ext: No edema. Pulses 2+    Planned procedures: Proceed with colonoscopy. The patient understands the nature of the planned procedure, indications, risks, alternatives and potential complications including but not limited to bleeding, infection, perforation, damage to internal organs and possible oversedation/side effects from anesthesia. The patient agrees and gives consent to proceed.  Please refer to procedure notes for findings, recommendations and patient disposition/instructions.     Evetta Renner K. Alice Reichert, M.D. Gastroenterology 01/12/2021  12:13 PM

## 2021-01-12 NOTE — Anesthesia Preprocedure Evaluation (Signed)
Anesthesia Evaluation  Patient identified by MRN, date of birth, ID band Patient awake    Reviewed: Allergy & Precautions, H&P , NPO status , Patient's Chart, lab work & pertinent test results  History of Anesthesia Complications Negative for: history of anesthetic complications  Airway Mallampati: III  TM Distance: <3 FB Neck ROM: limited    Dental  (+) Poor Dentition, Chipped   Pulmonary neg pulmonary ROS, neg shortness of breath,           Cardiovascular Exercise Tolerance: Good hypertension, (-) angina+ CAD, + CABG and +CHF  + dysrhythmias      Neuro/Psych  Headaches, TIA Neuromuscular disease negative psych ROS   GI/Hepatic Neg liver ROS, hiatal hernia, PUD, GERD  Medicated and Controlled,  Endo/Other  negative endocrine ROSdiabetes  Renal/GU negative Renal ROS  negative genitourinary   Musculoskeletal  (+) Arthritis ,   Abdominal   Peds  Hematology negative hematology ROS (+)   Anesthesia Other Findings Past Medical History: No date: Allergy No date: Barrett's esophagus No date: Blockage of coronary artery of heart (HCC) No date: BPH (benign prostatic hyperplasia) No date: Cataracts, bilateral No date: Colon polyp No date: Coronary artery disease 06/05/2014: Essential hypertension No date: GERD (gastroesophageal reflux disease)     Comment:  Barretts esophagus No date: GI bleed     Comment:  a. ~2012 around time of hiatal hernia surgery. Developed              melena/BRBPR, was placed back on Dexilant long-term. No date: Glaucoma No date: Hemorrhoids No date: History of chicken pox 2012: History of hiatal hernia No date: Hyperlipidemia No date: Meningioma [facility])     Comment:  stable, followed by yearly MRIs No date: Migraines No date: RBBB No date: Stomach ulcer  Past Surgical History: 1992: ATHERECTOMY 01/07/2015: CARDIAC CATHETERIZATION; N/A     Comment:  Procedure: Left Heart Cath;   Surgeon: Corey Skains,               MD;  Location: [facility];  Service:               Cardiovascular;  Laterality: N/A; No date: CARDIAC SURGERY     Comment:  coronary angioplasty No date: CHOLECYSTECTOMY 11/03/08, 03/09/14: COLONOSCOPY No date: CORONARY ANGIOPLASTY 01/19/2015: CORONARY ARTERY BYPASS GRAFT; N/A     Comment:  Procedure: CORONARY ARTERY BYPASS GRAFTING (CABG), with               LIM to LAD, vein to left intermediate, vein to PD, vein               to OM, using right greater saphenous vein via endovein               harvest.;  Surgeon: Grace Isaac, MD;  Location: [place];  Service: Open Heart Surgery;  Laterality: N/A; No date: DIAGNOSTIC LAPAROSCOPY     Comment:  cholecystectomy and hiatal hernia repair 01/19/2015: ENDOVEIN HARVEST OF GREATER SAPHENOUS VEIN; Right     Comment:  Procedure: ENDOVEIN HARVEST OF GREATER SAPHENOUS VEIN;                Surgeon: Grace Isaac, MD;  Location: [place];                Service: Open Heart Surgery;  Laterality: Right; 02/19/13: ESOPHAGOGASTRODUODENOSCOPY 08/09/2015: ESOPHAGOGASTRODUODENOSCOPY (EGD) WITH PROPOFOL; N/A  Comment:  Procedure: ESOPHAGOGASTRODUODENOSCOPY (EGD) WITH               PROPOFOL;  Surgeon: Hulen Luster, MD;  Location: [facility]               ENDOSCOPY;  Service: Gastroenterology;  Laterality: N/A; No date: ESOPHAGUS SURGERY No date: EYE SURGERY     Comment:  cataracts BIL No date: HERNIA REPAIR No date: HIATAL HERNIA REPAIR; N/A No date: POLYPECTOMY 01/19/2015: TEE WITHOUT CARDIOVERSION; N/A     Comment:  Procedure: TRANSESOPHAGEAL ECHOCARDIOGRAM (TEE);                Surgeon: Grace Isaac, MD;  Location: [place];                Service: Open Heart Surgery;  Laterality: N/A;     Reproductive/Obstetrics negative OB ROS                             Anesthesia Physical  Anesthesia Plan  ASA: III  Anesthesia Plan: General   Post-op Pain Management:     Induction: Intravenous  PONV Risk Score and Plan: Propofol infusion  Airway Management Planned: Natural Airway and Nasal Cannula  Additional Equipment:   Intra-op Plan:   Post-operative Plan:   Informed Consent: I have reviewed the patients History and Physical, chart, labs and discussed the procedure including the risks, benefits and alternatives for the proposed anesthesia with the patient or authorized representative who has indicated his/her understanding and acceptance.     Dental Advisory Given  Plan Discussed with: Anesthesiologist, CRNA and Surgeon  Anesthesia Plan Comments: (Patient and family informed that patient is higher risk for complications from anesthesia during this procedure due to their medical history and age including but not limited to post operative cognitive dysfunction.  They voiced understanding.  Patient consented for risks of anesthesia including but not limited to:  - adverse reactions to medications - risk of intubation if required - damage to teeth, lips or other oral mucosa - sore throat or hoarseness - Damage to heart, brain, lungs or loss of life  Patient voiced understanding.)        Anesthesia Quick Evaluation

## 2021-01-12 NOTE — Interval H&P Note (Signed)
History and Physical Interval Note:  01/12/2021 12:15 PM  Bradley [name]  has presented today for surgery, with the diagnosis of DIVERTICULITIS.  The various methods of treatment have been discussed with the patient and family. After consideration of risks, benefits and other options for treatment, the patient has consented to  Procedure(s): COLONOSCOPY WITH PROPOFOL (N/A) as a surgical intervention.  The patient's history has been reviewed, patient examined, no change in status, stable for surgery.  I have reviewed the patient's chart and labs.  Questions were answered to the patient's satisfaction.     [place], [place]

## 2021-01-13 ENCOUNTER — Encounter: Payer: Self-pay | Admitting: Internal Medicine

## 2021-01-13 LAB — SURGICAL PATHOLOGY

## 2021-01-19 ENCOUNTER — Encounter: Payer: Self-pay | Admitting: Surgery

## 2021-01-19 ENCOUNTER — Ambulatory Visit (INDEPENDENT_AMBULATORY_CARE_PROVIDER_SITE_OTHER): Payer: Medicare Other | Admitting: Surgery

## 2021-01-19 ENCOUNTER — Other Ambulatory Visit: Payer: Self-pay

## 2021-01-19 VITALS — BP 129/80 | HR 63 | Temp 97.6°F | Ht 75.0 in | Wt 225.2 lb

## 2021-01-19 DIAGNOSIS — Z461 Encounter for fitting and adjustment of hearing aid: Secondary | ICD-10-CM | POA: Insufficient documentation

## 2021-01-19 DIAGNOSIS — H9190 Unspecified hearing loss, unspecified ear: Secondary | ICD-10-CM | POA: Insufficient documentation

## 2021-01-19 DIAGNOSIS — Z961 Presence of intraocular lens: Secondary | ICD-10-CM | POA: Insufficient documentation

## 2021-01-19 DIAGNOSIS — K5732 Diverticulitis of large intestine without perforation or abscess without bleeding: Secondary | ICD-10-CM

## 2021-01-19 DIAGNOSIS — H524 Presbyopia: Secondary | ICD-10-CM | POA: Insufficient documentation

## 2021-01-19 DIAGNOSIS — Z77118 Contact with and (suspected) exposure to other environmental pollution: Secondary | ICD-10-CM | POA: Insufficient documentation

## 2021-01-19 DIAGNOSIS — H401134 Primary open-angle glaucoma, bilateral, indeterminate stage: Secondary | ICD-10-CM | POA: Insufficient documentation

## 2021-01-19 DIAGNOSIS — R5383 Other fatigue: Secondary | ICD-10-CM | POA: Insufficient documentation

## 2021-01-19 DIAGNOSIS — M545 Low back pain, unspecified: Secondary | ICD-10-CM | POA: Insufficient documentation

## 2021-01-19 DIAGNOSIS — J309 Allergic rhinitis, unspecified: Secondary | ICD-10-CM | POA: Insufficient documentation

## 2021-01-19 DIAGNOSIS — G43909 Migraine, unspecified, not intractable, without status migrainosus: Secondary | ICD-10-CM | POA: Insufficient documentation

## 2021-01-19 NOTE — Patient Instructions (Addendum)
Follow up here in 2 months.   Please call our office if you have any questions or concerns.  Be sure to discuss your wants with your spouse.   Report immediately to the ER if your symptoms return.    Diverticulitis  Diverticulitis is when small pouches in your colon (large intestine) get infected or swollen. This causes pain in the belly (abdomen) and watery poop (diarrhea). These pouches are called diverticula. The pouches form in people who have acondition called diverticulosis. What are the causes? This condition may be caused by poop (stool) that gets trapped in the pouches in your colon. The poop lets germs (bacteria) grow in the pouches. This causes the infection. What increases the risk? You are more likely to get this condition if you have small pouches in your colon. The risk is higher if: You are overweight or very overweight (obese). You do not exercise enough. You drink alcohol. You smoke or use products with tobacco in them. You eat a diet that has a lot of red meat such as beef, pork, or lamb. You eat a diet that does not have enough fiber in it. You are older than 81 years of age. What are the signs or symptoms? Pain in the belly. Pain is often on the left side, but it may be in other areas. Fever and feeling cold. Feeling like you may vomit. Vomiting. Having cramps. Feeling full. Changes to how often you poop. Blood in your poop. How is this treated? Most cases are treated at home by: Taking over-the-counter pain medicines. Following a clear liquid diet. Taking antibiotic medicines. Resting. Very bad cases may need to be treated at a hospital. This may include: Not eating or drinking. Taking prescription pain medicine. Getting antibiotic medicines through an IV tube. Getting fluid and food through an IV tube. Having surgery. When you are feeling better, your doctor may tell you to have a test to check your colon (colonoscopy). Follow these instructions at  home: Medicines Take over-the-counter and prescription medicines only as told by your doctor. These include: Antibiotics. Pain medicines. Fiber pills. Probiotics. Stool softeners. If you were prescribed an antibiotic medicine, take it as told by your doctor. Do not stop taking the antibiotic even if you start to feel better. Ask your doctor if the medicine prescribed to you requires you to avoid driving or using machinery. Eating and drinking  Follow a diet as told by your doctor. When you feel better, your doctor may tell you to change your diet. You may need to eat a lot of fiber. Fiber makes it easier to poop (have a bowel movement). Foods with fiber include: Berries. Beans. Lentils. Green vegetables. Avoid eating red meat.  General instructions Do not use any products that contain nicotine or tobacco, such as cigarettes, e-cigarettes, and chewing tobacco. If you need help quitting, ask your doctor. Exercise 3 or more times a week. Try to get 30 minutes each time. Exercise enough to sweat and make your heart beat faster. Keep all follow-up visits as told by your doctor. This is important. Contact a doctor if: Your pain does not get better. You are not pooping like normal. Get help right away if: Your pain gets worse. Your symptoms do not get better. Your symptoms get worse very fast. You have a fever. You vomit more than one time. You have poop that is: Bloody. Black. Tarry. Summary This condition happens when small pouches in your colon get infected or swollen. Take medicines only  as told by your doctor. Follow a diet as told by your doctor. Keep all follow-up visits as told by your doctor. This is important. This information is not intended to replace advice given to you by your health care provider. Make sure you discuss any questions you have with your healthcare provider. Document Revised: 04/14/2019 Document Reviewed: 04/14/2019 Elsevier Patient Education  2022  [employer].

## 2021-01-19 NOTE — Progress Notes (Signed)
Bradley [name] is an 80 year old male recently admitted to the hospital for perforated diverticulitis that require parenteral antibiotics.  He did very well.  He has now recover and had a recent colonoscopy showing diverticulosis.  He is here for discussion of further therapies. I Had an extensive discussion with him about the natural history of diverticulitis.  Typical indication for sigmoid colectomy include perforation and abscess which in his case he had.  He is functional but does have some cognitive deficits.  Does have coronary artery disease.  He lives with his wife. He also expressed concern about requiring multiple surgeries and we had an extensive discussion about goals of care.  He is certainly not inclined to pursue any major interventions.  He also understands that if he were to perforate again and require emergent surgical intervention he will likely refuse a Hartman's procedure and a colostomy as this will not give him good quality of life.  He wishes to talk to his primary care physician and family about his condition, DNR and end of life issues..  For now we will hold off of any surgical intervention.  His abdomen is benign and he is not toxic.   Note that I spent greater than 45 minutes in this encounter with greater than 50% spent in coordination and counseling of his care.

## 2021-02-02 ENCOUNTER — Other Ambulatory Visit: Payer: Self-pay

## 2021-02-02 ENCOUNTER — Ambulatory Visit (INDEPENDENT_AMBULATORY_CARE_PROVIDER_SITE_OTHER): Payer: Medicare Other | Admitting: Urology

## 2021-02-02 ENCOUNTER — Encounter: Payer: Self-pay | Admitting: Urology

## 2021-02-02 VITALS — BP 129/81 | HR 73 | Ht 74.0 in | Wt 222.0 lb

## 2021-02-02 DIAGNOSIS — R8290 Unspecified abnormal findings in urine: Secondary | ICD-10-CM

## 2021-02-02 DIAGNOSIS — N401 Enlarged prostate with lower urinary tract symptoms: Secondary | ICD-10-CM

## 2021-02-02 DIAGNOSIS — N390 Urinary tract infection, site not specified: Secondary | ICD-10-CM

## 2021-02-02 LAB — BLADDER SCAN AMB NON-IMAGING: Scan Result: 110

## 2021-02-02 MED ORDER — AMOXICILLIN-POT CLAVULANATE 875-125 MG PO TABS: 1.0000 | ORAL_TABLET | Freq: Two times a day (BID) | ORAL | 0 refills | Status: AC

## 2021-02-02 NOTE — Progress Notes (Signed)
   02/02/2021 12:54 PM   Bradley [name] Bradley [name], Bradley [name] [id number]  Reason for visit: Follow up BPH/urinary retention, recurrent UTIs, gross hematuria  HPI: 80 year old male who had been followed by Dr. Bernardo Heater in the past for BPH and underwent a UroLift in June 2021.  He had persistent urinary symptoms and recurrent UTIs, and also developed urinary retention requiring a Foley catheter for 2 weeks.  He passed a voiding trial, and continues to take Flomax and finasteride.  His primary urinary complaint is urinary frequency.  PVR is normal again today.  He is also had recurrent UTIs and intermittent gross hematuria.  Prostate measures 160 g on recent CT, and he was referred to discuss HOLEP.  Urinalysis today suspicious for infection, and sent for culture.  We discussed the risks and benefits of HoLEP at length.  The procedure requires general anesthesia and takes 1 to 2 hours, and a holmium laser is used to enucleate the prostate and push this tissue into the bladder.  A morcellator is then used to remove this tissue, which is sent for pathology.  The vast majority(>95%) of patients are able to discharge the same day with a catheter in place for 2 to 3 days, and will follow-up in clinic for a voiding trial.  We specifically discussed the risks of bleeding, infection, retrograde ejaculation, temporary urgency and urge incontinence, very low risk of long-term incontinence, urethral stricture/bladder neck contracture, pathologic evaluation of prostate tissue and possible detection of prostate cancer or other malignancy, and possible need for additional procedures.  I think he would benefit from [facility] and this likely would resolve his recurrent UTIs, urinary symptoms, would be able to come off of prostate medications, and resolve his hematuria.  He would like to discuss further with his wife, and will call to schedule HOLEP.  Augmentin twice daily x14 days for suspected UTI, call with culture results Patient  will call to schedule [name], [facility] [address], [place], [place] [id number] [phone]

## 2021-02-02 NOTE — Patient Instructions (Signed)

## 2021-02-04 LAB — URINALYSIS, COMPLETE
Bilirubin, UA: NEGATIVE
Glucose, UA: NEGATIVE
Ketones, UA: NEGATIVE
Nitrite, UA: NEGATIVE
Protein,UA: NEGATIVE
Specific Gravity, UA: 1.02 (ref 1.005–1.030)
Urobilinogen, Ur: 0.2 mg/dL (ref 0.2–1.0)
pH, UA: 6 (ref 5.0–7.5)

## 2021-02-04 LAB — CULTURE, URINE COMPREHENSIVE

## 2021-02-04 LAB — MICROSCOPIC EXAMINATION: WBC, UA: >30 /hpf — AB (ref 0–5)

## 2021-03-28 ENCOUNTER — Ambulatory Visit: Payer: Medicare Other | Admitting: Surgery

## 2021-04-04 ENCOUNTER — Other Ambulatory Visit: Payer: Self-pay

## 2021-04-04 ENCOUNTER — Encounter: Payer: Self-pay | Admitting: Surgery

## 2021-04-04 ENCOUNTER — Ambulatory Visit (INDEPENDENT_AMBULATORY_CARE_PROVIDER_SITE_OTHER): Payer: Medicare Other | Admitting: Surgery

## 2021-04-04 VITALS — BP 119/74 | HR 70 | Temp 98.0°F | Ht 74.0 in | Wt 225.0 lb

## 2021-04-04 DIAGNOSIS — K5732 Diverticulitis of large intestine without perforation or abscess without bleeding: Secondary | ICD-10-CM | POA: Diagnosis not present

## 2021-04-04 NOTE — Progress Notes (Signed)
Outpatient Surgical Follow Up  04/04/2021  Bradley [name] is an 80 y.o. male.   No chief complaint on file.   HPI: Bette is an 80 year old male well-known to me with prior history of complicated diverticulitis.  Had an abscess and this was all treated medically.  States that his diverticulitis is well controlled.  No fevers or chills he has been recently diagnosed with Lewis body dementia.  He comes in with his wife.  We had an extensive discussion regarding goals of care.  Past Medical History:  Diagnosis Date   Allergy    Barrett's esophagus    Blockage of coronary artery of heart (HCC)    BPH (benign prostatic hyperplasia)    Cataracts, bilateral    Chronic diastolic CHF (congestive heart failure), NYHA class 2 ([place]) 12/16/2014   Colon polyp    Coronary artery disease    CRI (chronic renal insufficiency), stage 3 (moderate) ([place]) 07/20/2020   DDD (degenerative disc disease), lumbosacral 12/05/2017   Diabetes mellitus without complication ([place])    usually running low. to see endocrinologist 01/2020    Essential hypertension 06/05/2014   GERD (gastroesophageal reflux disease)    Barretts esophagus   GI bleed    a. ~2012 around time of hiatal hernia surgery. 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   Glaucoma    Glaucoma    Hemorrhoids    History of chicken pox    History of hiatal hernia 2012   Hyperlipidemia    Meningioma (HCC)    stable, followed by yearly MRIs   Migraines    Neuromuscular disorder ([place])    neuropathies of lower extrems   RBBB    also, AF and bradycardia   Stomach ulcer     Past Surgical History:  Procedure Laterality Date   ATHERECTOMY  1992   CARDIAC CATHETERIZATION N/A 01/07/2015   Procedure: Left Heart Cath;  Surgeon: Corey Skains, MD;  Location: [facility];  Service: Cardiovascular;  Laterality: N/A;   CARDIAC SURGERY     coronary angioplasty   CHOLECYSTECTOMY     COLONOSCOPY  11/03/08, 03/09/14   COLONOSCOPY  WITH PROPOFOL N/A 01/12/2021   Procedure: COLONOSCOPY WITH PROPOFOL;  Surgeon: Toledo, Benay Pike, MD;  Location: [facility];  Service: Gastroenterology;  Laterality: N/A;   CORONARY ANGIOPLASTY     CORONARY ARTERY BYPASS GRAFT N/A 01/19/2015   Procedure: CORONARY ARTERY BYPASS GRAFTING (CABG), with LIM to LAD, vein to left intermediate, vein to PD, vein to OM, using right greater saphenous vein via endovein harvest.;  Surgeon: Grace Isaac, MD;  Location: [facility];  Service: Open Heart Surgery;  Laterality: N/A;   CYSTOSCOPY WITH INSERTION OF UROLIFT N/A 01/13/2020   Procedure: CYSTOSCOPY WITH INSERTION OF UROLIFT;  Surgeon: Abbie Sons, MD;  Location: [facility];  Service: Urology;  Laterality: N/A;   DIAGNOSTIC LAPAROSCOPY     cholecystectomy and hiatal hernia repair   ENDOVEIN HARVEST OF GREATER SAPHENOUS VEIN Right 01/19/2015   Procedure: ENDOVEIN HARVEST OF GREATER SAPHENOUS VEIN;  Surgeon: Grace Isaac, MD;  Location: [facility];  Service: Open Heart Surgery;  Laterality: Right;   ESOPHAGOGASTRODUODENOSCOPY  02/19/13   ESOPHAGOGASTRODUODENOSCOPY (EGD) WITH PROPOFOL N/A 08/09/2015   Procedure: ESOPHAGOGASTRODUODENOSCOPY (EGD) WITH PROPOFOL;  Surgeon: Hulen Luster, MD;  Location[facility]COPY;  Service: Gastroenterology;  Laterality: N/A;   ESOPHAGOGASTRODUODENOSCOPY (EGD) WITH PROPOFOL N/A 04/13/2017   Procedure: ESOPHAGOGASTRODUODENOSCOPY (EGD) WITH PROPOFOL;  Surgeon: Alice Reichert, Benay Pike, MD;  Location: [facility]  ENDOSCOPY;  Service: Endoscopy;  Laterality: N/A;   ESOPHAGUS SURGERY     EYE SURGERY     cataracts BIL   HERNIA REPAIR     HIATAL HERNIA REPAIR N/A    POLYPECTOMY     TEE WITHOUT CARDIOVERSION N/A 01/19/2015   Procedure: TRANSESOPHAGEAL ECHOCARDIOGRAM (TEE);  Surgeon: Grace Isaac, MD;  Location: [facility];  Service: Open Heart Surgery;  Laterality: N/A;    Family History  Problem Relation Age of Onset   Parkinsonism Mother    Kidney disease Father    Cancer Father         prostate   Heart disease Father    Cancer Paternal Uncle        esophageal   Cancer Paternal Uncle     Social History:  reports that he has never smoked. He has never used smokeless tobacco. He reports that he does not drink alcohol and does not use drugs.  Allergies:  Allergies  Allergen Reactions   Metoprolol Other (See Comments)    Hypotension   Sulfadiazine Other (See Comments)    unknown   Oxycodone Other (See Comments)    Reaction:  Hypotension    Sulfa Antibiotics Rash    Medications reviewed.    ROS Full ROS performed and is otherwise negative other than what is stated in HPI   BP 119/74   Pulse 70   Temp 98 F (36.7 C)   Ht '6\' 2"'$  (1.88 m)   Wt 225 lb (102.1 kg)   SpO2 96%   BMI 28.89 kg/m   Physical Exam Vitals and nursing note reviewed. Exam conducted with a chaperone present.  Constitutional:      Appearance: Normal appearance. He is normal weight. He is not ill-appearing.  Eyes:     General: No scleral icterus.       Right eye: No discharge.        Left eye: No discharge.  Pulmonary:     Effort: Pulmonary effort is normal. No respiratory distress.     Breath sounds: Normal breath sounds. No stridor. No wheezing.  Abdominal:     General: Abdomen is flat. There is no distension.     Palpations: Abdomen is soft. There is no mass.     Tenderness: There is no abdominal tenderness. There is no guarding or rebound.     Hernia: No hernia is present.  Musculoskeletal:        General: Normal range of motion.  Skin:    General: Skin is warm and dry.     Capillary Refill: Capillary refill takes less than 2 seconds.  Neurological:     General: No focal deficit present.     Mental Status: He is alert.  Psychiatric:        Mood and Affect: Mood normal.        Behavior: Behavior normal.        Thought Content: Thought content normal.        Judgment: Judgment normal.     Assessment/Plan: History of perforated diverticulitis with a small abscess that  responded to medical management.  Patient with recently diagnosed dementia Lewis body type.  We can discuss about different options and means about end-of-life.  He remains at DNR and wishes to avoid any major surgical invention if at all possible  Greater than 50% of the 30 minutes  visit was spent in counseling/coordination of care   Caroleen Hamman, MD [facility]

## 2021-04-04 NOTE — Patient Instructions (Addendum)
You may eat what ever foods you like. Add in in moderation until you see how your body reacts.   Follow-up with our office as needed.  Please call and ask to speak with a nurse if you develop questions or concerns.

## 2021-07-19 ENCOUNTER — Other Ambulatory Visit: Payer: Self-pay | Admitting: Urology

## 2021-07-21 ENCOUNTER — Ambulatory Visit (INDEPENDENT_AMBULATORY_CARE_PROVIDER_SITE_OTHER): Payer: Medicare Other | Admitting: Urology

## 2021-07-21 ENCOUNTER — Other Ambulatory Visit: Payer: Self-pay

## 2021-07-21 ENCOUNTER — Encounter: Payer: Self-pay | Admitting: Urology

## 2021-07-21 VITALS — BP 114/73 | HR 61 | Ht 74.0 in | Wt 220.0 lb

## 2021-07-21 DIAGNOSIS — N401 Enlarged prostate with lower urinary tract symptoms: Secondary | ICD-10-CM | POA: Diagnosis not present

## 2021-07-21 MED ORDER — FINASTERIDE 5 MG PO TABS: 5.0000 mg | ORAL_TABLET | Freq: Every day | ORAL | 3 refills | Status: DC

## 2021-07-21 MED ORDER — TAMSULOSIN HCL 0.4 MG PO CAPS: 0.4000 mg | ORAL_CAPSULE | Freq: Every day | ORAL | 3 refills | Status: DC

## 2021-07-21 NOTE — Progress Notes (Signed)
07/21/2021 8:53 AM   Bradley [name] 08/25/1940 [id number]  Referring provider: Sofie Hartigan, MD [place],  [place] [address]  Chief Complaint  Patient presents with   Benign Prostatic Hypertrophy    Urologic history:  1.  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  BPH with lower urinary tract symptoms             -On dutasteride             -PVP 2008             -UroLift 01/13/2020   3.  Erectile dysfunction             -Uses vacuum erection device periodically  HPI: 81 y.o. male presents for follow-up.  Since his last visit he states he has been diagnosed with Lewy body dementia He saw Dr. Diamantina Providence July 2022 to discuss HoLEP however did not remember seeing him Overall he feels his voiding pattern has improved.  He does not desire an outlet procedure at this time No recurrent gross hematuria Remains on tamsulosin and finasteride   PMH: Past Medical History:  Diagnosis Date   Allergy    Barrett's esophagus    Blockage of coronary artery of heart (HCC)    BPH (benign prostatic hyperplasia)    Cataracts, bilateral    Chronic diastolic CHF (congestive heart failure), NYHA class 2 ([place]) 12/16/2014   Colon polyp    Coronary artery disease    CRI (chronic renal insufficiency), stage 3 (moderate) ([place]) 07/20/2020   DDD (degenerative disc disease), lumbosacral 12/05/2017   Diabetes mellitus without complication ([place])    usually running low. to see endocrinologist 01/2020    Essential hypertension 06/05/2014   GERD (gastroesophageal reflux disease)    Barretts esophagus   GI bleed    a. ~2012 around time of hiatal hernia surgery. 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   Glaucoma    Glaucoma    Hemorrhoids    History of chicken pox    History of hiatal hernia 2012   Hyperlipidemia    Meningioma (HCC)    stable, followed  by yearly MRIs   Migraines    Neuromuscular disorder (HCC)    neuropathies of lower extrems   RBBB    also, AF and bradycardia   Stomach ulcer     Surgical History: Past Surgical History:  Procedure Laterality Date   ATHERECTOMY  1992   CARDIAC CATHETERIZATION N/A 01/07/2015   Procedure: Left Heart Cath;  Surgeon: Corey Skains, MD;  Location: [facility];  Service: Cardiovascular;  Laterality: N/A;   CARDIAC SURGERY     coronary angioplasty   CHOLECYSTECTOMY     COLONOSCOPY  11/03/08, 03/09/14   COLONOSCOPY WITH PROPOFOL N/A 01/12/2021   Procedure: COLONOSCOPY WITH PROPOFOL;  Surgeon: Toledo, Benay Pike, MD;  Location: [facility];  Service: Gastroenterology;  Laterality: N/A;   CORONARY ANGIOPLASTY     CORONARY ARTERY BYPASS GRAFT N/A 01/19/2015   Procedure: CORONARY ARTERY BYPASS GRAFTING (CABG), with LIM to LAD, vein to left intermediate, vein to PD, vein to OM, using right greater saphenous vein via endovein harvest.;  Surgeon: Grace Isaac, MD;  Location: [facility];  Service: Open Heart Surgery;  Laterality: N/A;   CYSTOSCOPY WITH INSERTION OF UROLIFT N/A 01/13/2020   Procedure: CYSTOSCOPY WITH INSERTION OF UROLIFT;  Surgeon: Abbie Sons, MD;  Location: [facility];  Service: Urology;  Laterality: N/A;   DIAGNOSTIC LAPAROSCOPY     cholecystectomy and hiatal hernia repair   ENDOVEIN HARVEST OF GREATER SAPHENOUS VEIN Right 01/19/2015   Procedure: ENDOVEIN HARVEST OF GREATER SAPHENOUS VEIN;  Surgeon: Grace Isaac, MD;  Location: [facility];  Service: Open Heart Surgery;  Laterality: Right;   ESOPHAGOGASTRODUODENOSCOPY  02/19/13   ESOPHAGOGASTRODUODENOSCOPY (EGD) WITH PROPOFOL N/A 08/09/2015   Procedure: ESOPHAGOGASTRODUODENOSCOPY (EGD) WITH PROPOFOL;  Surgeon: Hulen Luster, MD;  Location: [facility] ENDOSCOPY;  Service: Gastroenterology;  Laterality: N/A;   ESOPHAGOGASTRODUODENOSCOPY (EGD) WITH PROPOFOL N/A 04/13/2017   Procedure: ESOPHAGOGASTRODUODENOSCOPY (EGD) WITH PROPOFOL;   Surgeon: Toledo, Benay Pike, MD;  Location: [facility]OS[facility];  Service: Endoscopy;  Laterality: N/A;   ESOPHAGUS SURGERY     EYE SURGERY     cataracts BIL   HERNIA REPAIR     HIATAL HERNIA REPAIR N/A    POLYPECTOMY     TEE WITHOUT CARDIOVERSION N/A 01/19/2015   Procedure: TRANSESOPHAGEAL ECHOCARDIOGRAM (TEE);  Surgeon: Grace Isaac, MD;  Location: [facility];  Service: Open Heart Surgery;  Laterality: N/A;    Home Medications:  Allergies as of 07/21/2021       Reactions   Metoprolol Other (See Comments)   Hypotension   Sulfadiazine Other (See Comments)   unknown   Oxycodone Other (See Comments)   Reaction:  Hypotension    Sulfa Antibiotics Rash        Medication List        Accurate as of July 21, 2021  8:53 AM. If you have any questions, ask your nurse or doctor.          acetaminophen 500 MG tablet Commonly known as: TYLENOL Take 500 mg by mouth every 6 (six) hours as needed for mild pain or moderate pain.   aspirin EC 81 MG tablet Take 81 mg by mouth daily.   atorvastatin 40 MG tablet Commonly known as: LIPITOR TAKE ONE-HALF TABLET BY MOUTH QHS (ATBEDTIME)   Brinzolamide-Brimonidine 1-0.2 % Susp Place 1 drop into both eyes 2 (two) times daily.   carboxymethylcellulose 0.5 % Soln Commonly known as: REFRESH PLUS INSTILL 1 DROP IN EACH EYE FOUR TIMES A DAY AS NEEDED   cetirizine 10 MG tablet Commonly known as: ZYRTEC Take 10 mg by mouth daily.   chlorhexidine 0.12 % solution Commonly known as: PERIDEX Use as directed 15 mLs in the mouth or throat 2 (two) times daily as needed (for gum inflammation).   cyanocobalamin 2000 MCG tablet Take 2,000 mcg by mouth daily.   docusate sodium 100 MG capsule Commonly known as: COLACE Take 1 capsule (100 mg total) by mouth every 12 (twelve) hours.   dorzolamide-timolol 22.3-6.8 MG/ML ophthalmic solution Commonly known as: COSOPT Apply 1 drop to eye 2 (two) times daily.   eucerin cream Apply 1 application  topically daily as needed for dry skin.   finasteride 5 MG tablet Commonly known as: PROSCAR Take 1 tablet (5 mg total) by mouth daily. What changed:  how much to take when to take this Changed by: Abbie Sons, MD   fluticasone 50 MCG/ACT nasal spray Commonly known as: FLONASE Place 2 sprays into both nostrils daily as needed for allergies.   hydrocortisone 2.5 % rectal cream Commonly known as: ANUSOL-HC Place 1 application rectally daily as needed for  hemorrhoids.   hydrocortisone cream 1 % Apply 1 application topically 3 (three) times daily as needed for itching. Apply to external hemorrhoid x 7-10 days, then prn with reoccurrence   ketoconazole 2 % cream Commonly known as: NIZORAL Apply 1 application topically 2 (two) times daily.   latanoprost 0.005 % ophthalmic solution Commonly known as: XALATAN Place 1 drop into both eyes at bedtime.   Magnesium 250 MG Tabs Take 250 mg by mouth daily.   omeprazole 40 MG capsule Commonly known as: PRILOSEC Take 40 mg by mouth daily.   Polyethyl Glycol-Propyl Glycol 0.4-0.3 % Soln Place 1 drop into both eyes daily as needed for dry eyes.   pravastatin 20 MG tablet Commonly known as: PRAVACHOL Take 20 mg by mouth daily.   rivastigmine 1.5 MG capsule Commonly known as: EXELON Take by mouth.   tamsulosin 0.4 MG Caps capsule Commonly known as: FLOMAX Take 1 capsule (0.4 mg total) by mouth daily. What changed: when to take this Changed by: Abbie Sons, MD   timolol 0.25 % ophthalmic solution Commonly known as: TIMOPTIC Place 1 drop into both eyes daily.   triamcinolone cream 0.1 % Commonly known as: KENALOG Apply 1 application topically 2 (two) times daily as needed (Foot).        Allergies:  Allergies  Allergen Reactions   Metoprolol Other (See Comments)    Hypotension   Sulfadiazine Other (See Comments)    unknown   Oxycodone Other (See Comments)    Reaction:  Hypotension    Sulfa Antibiotics Rash     Family History: Family History  Problem Relation Age of Onset   Parkinsonism Mother    Kidney disease Father    Cancer Father        prostate   Heart disease Father    Cancer Paternal Uncle        esophageal   Cancer Paternal Uncle     Social History:  reports that he has never smoked. He has never used smokeless tobacco. He reports that he does not drink alcohol and does not use drugs.   Physical Exam: BP 114/73    Pulse 61    Ht 6\' 2"  (1.88 m)    Wt 220 lb (99.8 kg)    BMI 28.25 kg/m   Constitutional:  Alert, no acute distress. HEENT: [place] AT, moist mucus membranes.  Trachea midline, no masses. Cardiovascular: No clubbing, cyanosis, or edema. Respiratory: Normal respiratory effort, no increased work of breathing.   Assessment & Plan:    1.  BPH with LUTS He has elected to hold off on HoLEP and desires to continue tamsulosin/finasteride Medications were refilled Follow-up 1 year and instructed call earlier for worsening voiding symptoms or if he elects to proceed with HoLEP   Abbie Sons, MD  [facility] [address], [place], [place] [address] [phone]

## 2022-05-05 ENCOUNTER — Other Ambulatory Visit: Payer: Self-pay | Admitting: Family Medicine

## 2022-05-05 MED ORDER — TAMSULOSIN HCL 0.4 MG PO CAPS: 0.4000 mg | ORAL_CAPSULE | Freq: Every day | ORAL | 3 refills | Status: DC

## 2022-05-31 ENCOUNTER — Telehealth: Payer: Self-pay | Admitting: Urology

## 2022-05-31 NOTE — Telephone Encounter (Signed)
Patient states he will drink a lot of water . He did urinate this morning. He states he might want to male a appt to discuss HOLP

## 2022-05-31 NOTE — Telephone Encounter (Signed)
Pt states he was unable to urinate last night.  He has been able to go once this morning.  He would like a call back to discuss, he is scared he'll get to the point where he's unable to urinate at all again.  [phone]

## 2022-06-05 ENCOUNTER — Encounter: Payer: Self-pay | Admitting: Urology

## 2022-06-05 ENCOUNTER — Other Ambulatory Visit
Admission: RE | Admit: 2022-06-05 | Discharge: 2022-06-05 | Disposition: A | Discharge status: Home / Self Care | Payer: Medicare Other | Attending: Urology | Admitting: Urology

## 2022-06-05 ENCOUNTER — Other Ambulatory Visit: Payer: Self-pay

## 2022-06-05 ENCOUNTER — Ambulatory Visit (INDEPENDENT_AMBULATORY_CARE_PROVIDER_SITE_OTHER): Payer: Medicare Other | Admitting: Urology

## 2022-06-05 VITALS — BP 151/95 | HR 70 | Ht 74.0 in | Wt 233.6 lb

## 2022-06-05 DIAGNOSIS — N200 Calculus of kidney: Secondary | ICD-10-CM | POA: Diagnosis not present

## 2022-06-05 DIAGNOSIS — R310 Gross hematuria: Secondary | ICD-10-CM | POA: Diagnosis present

## 2022-06-05 DIAGNOSIS — N401 Enlarged prostate with lower urinary tract symptoms: Secondary | ICD-10-CM

## 2022-06-05 LAB — URINALYSIS, COMPLETE (UACMP) WITH MICROSCOPIC
Bilirubin Urine: NEGATIVE
Glucose, UA: NEGATIVE mg/dL
Ketones, ur: NEGATIVE mg/dL
Leukocytes,Ua: NEGATIVE
Nitrite: NEGATIVE
Protein, ur: 30 mg/dL — AB
Specific Gravity, Urine: 1.02 (ref 1.005–1.030)
Squamous Epithelial / HPF: NONE SEEN (ref 0–5)
pH: 5.5 (ref 5.0–8.0)

## 2022-06-05 LAB — BLADDER SCAN AMB NON-IMAGING

## 2022-06-05 MED ORDER — NITROFURANTOIN MONOHYD MACRO 100 MG PO CAPS: 100.0000 mg | ORAL_CAPSULE | Freq: Two times a day (BID) | ORAL | 0 refills | Status: DC

## 2022-06-05 NOTE — Progress Notes (Signed)
06/05/2022 2:37 PM   Bradley [name] [birth year] [id number]  Referring provider: Sofie Hartigan, MD [place],  [place] [address]  Urological history: 1. BPH with LU TS -PSA (2020) 6.02 -TUMT (2007) -PVP (2008) -cysto (2019) - Adenoma regrowth distally, proximal one half of gland open prominent hypervascularity -cysto (2020) - Open bladder neck with adenoma regrowth mid to distal prostate-lateral lobes touching mid to distal prostate - Mild elevation bladder neck -UroLift (2021) - 8 implants, 6 deployed, 2 attempted  -prostate volume 160 cc on CTU (2022)  -cysto (2022) - Lateral lobe enlargement prostate with prominent hypervascularity/friability  -PVR 125 mL -Tamsulosin 0.4 mg daily and finasteride 5 mg daily   2. Elevated PSA -iPSA 8.3 (2009) bx - focus of high-grade PIN -iPSA 10.4 (2011) bx - negative  3. High grade hematuria -non-smoker -RUS (2019) - no malignancy -cysto (2019) - no malignancy -cysto (2022) - no malignancy  -CTU (2022) - no malignancy  -contrast CT (2022) - no malignancy -reports of gross heme -UA 21-50 RBC's   4. Nephrolithiasis -RUS (2019) - non obstructing 9 mm left renal calculus  -CTU (2022) - Single nonobstructing LEFT renal calculus -contrast CT (2022) - no change  5. Erectile dysfunction -Contributing factors of age, BPH, CHF, CAD, lumbar degenerative disc disease, hypertension, diabetes and hyperlipidemia -vacuum erection device  6. Urinary retention -after UroLift 2021  7. Renal cyst -CTU (2022) - Bilateral Bosniak 1 renal cysts   Chief Complaint  Patient presents with   Hematuria    HPI: Bradley [name] is a 81 y.o. male who presents today for gross heme.    He had an incident with a perforated diverticulitis with a small abscess that did respond well to medical management in 2022.  He has been diagnosed with Louis body type dementia and he is DNR and would like to avoid any emergent surgical inventions.  He  started to have gross heme last week for a day.  He stated he went for a awhile without urinating and that concerned him, but then he was able to urinate.  Then it was cleared for a few days and returned and he passed out a long piece of clot.    Patient denies any modifying or aggravating factors.  Patient denies any dysuria or suprapubic/flank pain.  Patient denies any fevers, chills, nausea or vomiting.    UA brown hazy, specific gravity 1.020, pH 5.5, moderate hemoglobin dip, 30 protein, 0-5 WBCs, 21-50 RBCs, rare bacteria, mucus present, amorphous crystals present  PVR 125 mL    PMH: Past Medical History:  Diagnosis Date   Allergy    Barrett's esophagus    Blockage of coronary artery of heart (HCC)    BPH (benign prostatic hyperplasia)    Cataracts, bilateral    Chronic diastolic CHF (congestive heart failure), NYHA class 2 ([place]) 12/16/2014   Colon polyp    Coronary artery disease    CRI (chronic renal insufficiency), stage 3 (moderate) ([place]) 07/20/2020   DDD (degenerative disc disease), lumbosacral 12/05/2017   Diabetes mellitus without complication ([place])    usually running low. to see endocrinologist 01/2020    Essential hypertension 06/05/2014   GERD (gastroesophageal reflux disease)    Barretts esophagus   GI bleed    a. ~2012 around time of hiatal hernia surgery. 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   Glaucoma    Glaucoma    Hemorrhoids    History of chicken pox  History of hiatal hernia 2012   Hyperlipidemia    Meningioma (HCC)    stable, followed by yearly MRIs   Migraines    Neuromuscular disorder (HCC)    neuropathies of lower extrems   RBBB    also, AF and bradycardia   Stomach ulcer     Surgical History: Past Surgical History:  Procedure Laterality Date   ATHERECTOMY  1992   CARDIAC CATHETERIZATION N/A 01/07/2015   Procedure: Left Heart Cath;  Surgeon: Corey Skains, MD;  Location: [facility];  Service:  Cardiovascular;  Laterality: N/A;   CARDIAC SURGERY     coronary angioplasty   CHOLECYSTECTOMY     COLONOSCOPY  11/03/08, 03/09/14   COLONOSCOPY WITH PROPOFOL N/A 01/12/2021   Procedure: COLONOSCOPY WITH PROPOFOL;  Surgeon: Toledo, Benay Pike, MD;  Location: [facility];  Service: Gastroenterology;  Laterality: N/A;   CORONARY ANGIOPLASTY     CORONARY ARTERY BYPASS GRAFT N/A 01/19/2015   Procedure: CORONARY ARTERY BYPASS GRAFTING (CABG), with LIM to LAD, vein to left intermediate, vein to PD, vein to OM, using right greater saphenous vein via endovein harvest.;  Surgeon: Grace Isaac, MD;  Location: [facility];  Service: Open Heart Surgery;  Laterality: N/A;   CYSTOSCOPY WITH INSERTION OF UROLIFT N/A 01/13/2020   Procedure: CYSTOSCOPY WITH INSERTION OF UROLIFT;  Surgeon: Abbie Sons, MD;  Location: [facility];  Service: Urology;  Laterality: N/A;   DIAGNOSTIC LAPAROSCOPY     cholecystectomy and hiatal hernia repair   ENDOVEIN HARVEST OF GREATER SAPHENOUS VEIN Right 01/19/2015   Procedure: ENDOVEIN HARVEST OF GREATER SAPHENOUS VEIN;  Surgeon: Grace Isaac, MD;  Location: [facility];  Service: Open Heart Surgery;  Laterality: Right;   ESOPHAGOGASTRODUODENOSCOPY  02/19/13   ESOPHAGOGASTRODUODENOSCOPY (EGD) WITH PROPOFOL N/A 08/09/2015   Procedure: ESOPHAGOGASTRODUODENOSCOPY (EGD) WITH PROPOFOL;  Surgeon: Hulen Luster, MD;  Location: [facility];  Service: Gastroenterology;  Laterality: N/A;   ESOPHAGOGASTRODUODENOSCOPY (EGD) WITH PROPOFOL N/A 04/13/2017   Procedure: ESOPHAGOGASTRODUODENOSCOPY (EGD) WITH PROPOFOL;  Surgeon: Toledo, Benay Pike, MD;  Location: [facility];  Service: Endoscopy;  Laterality: N/A;   ESOPHAGUS SURGERY     EYE SURGERY     cataracts BIL   HERNIA REPAIR     HIATAL HERNIA REPAIR N/A    POLYPECTOMY     TEE WITHOUT CARDIOVERSION N/A 01/19/2015   Procedure: TRANSESOPHAGEAL ECHOCARDIOGRAM (TEE);  Surgeon: Grace Isaac, MD;  Location: [facility];  Service: Open Heart Surgery;   Laterality: N/A;    Home Medications:  Allergies as of 06/05/2022       Reactions   Metoprolol Other (See Comments)   Hypotension   Sulfadiazine Other (See Comments)   unknown   Oxycodone Other (See Comments)   Reaction:  Hypotension    Sulfa Antibiotics Rash        Medication List        Accurate as of June 05, 2022  2:37 PM. If you have any questions, ask your nurse or doctor.          STOP taking these medications    hydrocortisone cream 1 % Stopped by: Pixie Burgener, PA-C       TAKE these medications    acetaminophen 500 MG tablet Commonly known as: TYLENOL Take 500 mg by mouth every 6 (six) hours as needed for mild pain or moderate pain.   aspirin EC 81 MG tablet Take 81 mg by mouth daily.   atorvastatin 40 MG tablet Commonly known as: LIPITOR  TAKE ONE-HALF TABLET BY MOUTH QHS (ATBEDTIME)   Brinzolamide-Brimonidine 1-0.2 % Susp Place 1 drop into both eyes 2 (two) times daily.   carboxymethylcellulose 0.5 % Soln Commonly known as: REFRESH PLUS INSTILL 1 DROP IN EACH EYE FOUR TIMES A DAY AS NEEDED   cetirizine 10 MG tablet Commonly known as: ZYRTEC Take 10 mg by mouth daily.   chlorhexidine 0.12 % solution Commonly known as: PERIDEX Use as directed 15 mLs in the mouth or throat 2 (two) times daily as needed (for gum inflammation).   cyanocobalamin 2000 MCG tablet Take 2,000 mcg by mouth daily.   docusate sodium 100 MG capsule Commonly known as: COLACE Take 1 capsule (100 mg total) by mouth every 12 (twelve) hours.   dorzolamide 2 % ophthalmic solution Commonly known as: TRUSOPT Apply to eye.   dorzolamide-timolol 2-0.5 % ophthalmic solution Commonly known as: COSOPT Apply 1 drop to eye 2 (two) times daily.   eucerin cream Apply 1 application topically daily as needed for dry skin.   finasteride 5 MG tablet Commonly known as: PROSCAR Take 1 tablet (5 mg total) by mouth daily.   fluticasone 50 MCG/ACT nasal spray Commonly  known as: FLONASE Place 2 sprays into both nostrils daily as needed for allergies.   furosemide 20 MG tablet Commonly known as: LASIX Take 20 mg by mouth daily.   hydrocortisone 2.5 % rectal cream Commonly known as: ANUSOL-HC Place 1 application rectally daily as needed for hemorrhoids.   ketoconazole 2 % cream Commonly known as: NIZORAL Apply 1 application topically 2 (two) times daily.   Lanolin Alcohol Wax Apply topically.   latanoprost 0.005 % ophthalmic solution Commonly known as: XALATAN Place 1 drop into both eyes at bedtime.   Magnesium 250 MG Tabs Take 250 mg by mouth daily.   nitrofurantoin (macrocrystal-monohydrate) 100 MG capsule Commonly known as: MACROBID Take 1 capsule (100 mg total) by mouth every 12 (twelve) hours. Started by: Zara Council, PA-C   omeprazole 40 MG capsule Commonly known as: PRILOSEC Take 40 mg by mouth daily.   Polyethyl Glycol-Propyl Glycol 0.4-0.3 % Soln Place 1 drop into both eyes daily as needed for dry eyes.   pravastatin 20 MG tablet Commonly known as: PRAVACHOL Take 20 mg by mouth daily.   rivastigmine 3 MG capsule Commonly known as: EXELON Take by mouth. What changed: Another medication with the same name was removed. Continue taking this medication, and follow the directions you see here. Changed by: Zara Council, PA-C   Rollator Ultra-Light Misc Use 1 each once daily   tamsulosin 0.4 MG Caps capsule Commonly known as: FLOMAX Take 1 capsule (0.4 mg total) by mouth daily.   timolol 0.25 % ophthalmic solution Commonly known as: TIMOPTIC Place 1 drop into both eyes daily.   triamcinolone cream 0.1 % Commonly known as: KENALOG Apply 1 application topically 2 (two) times daily as needed (Foot).        Allergies:  Allergies  Allergen Reactions   Metoprolol Other (See Comments)    Hypotension   Sulfadiazine Other (See Comments)    unknown   Oxycodone Other (See Comments)    Reaction:  Hypotension     Sulfa Antibiotics Rash    Family History: Family History  Problem Relation Age of Onset   Parkinsonism Mother    Kidney disease Father    Cancer Father        prostate   Heart disease Father    Cancer Paternal Uncle  esophageal   Cancer Paternal Uncle     Social History:  reports that he has never smoked. He has never used smokeless tobacco. He reports that he does not drink alcohol and does not use drugs.  ROS: Pertinent ROS in HPI  Physical Exam: BP (!) 151/95 (BP Location: Left Arm, Patient Position: Sitting, Cuff Size: Large)   Pulse 70   Ht 6' 2" (1.88 m)   Wt 233 lb 9.6 oz (106 kg)   BMI 29.99 kg/m   Constitutional:  Well nourished. Alert and oriented, No acute distress. HEENT: [place] AT, moist mucus membranes.  Trachea midline Cardiovascular: No clubbing, cyanosis, or edema. Respiratory: Normal respiratory effort, no increased work of breathing. Skin: No rashes, bruises or suspicious lesions. Lymph: No cervical or inguinal adenopathy. Neurologic: Grossly intact, no focal deficits, moving all 4 extremities. Psychiatric: Normal mood and affect.  Laboratory Data: Glucose 70 - 110 mg/dL 91  Sodium 136 - 145 mmol/L 142  Potassium 3.6 - 5.1 mmol/L 4.5  Chloride 97 - 109 mmol/L 109  Carbon Dioxide (CO2) 22.0 - 32.0 mmol/L 29.3  Urea Nitrogen (BUN) 7 - 25 mg/dL 23  Creatinine 0.7 - 1.3 mg/dL 1.5 High   Glomerular Filtration Rate (eGFR) >60 mL/min/1.73sq m 46 Low   Comment: CKD-EPI (2021) does not include patient's race in the calculation of eGFR.  Monitoring changes of plasma creatinine and eGFR over time is useful for monitoring kidney function.  Interpretive Ranges for eGFR (CKD-EPI 2021):  eGFR:       >60 mL/min/1.73 sq. m - Normal eGFR:       30-59 mL/min/1.73 sq. m - Moderately Decreased eGFR:       15-29 mL/min/1.73 sq. m  - Severely Decreased eGFR:       < 15 mL/min/1.73 sq. m  - Kidney Failure   Note: These eGFR calculations do not apply in acute  situations when eGFR is changing rapidly or patients on dialysis.  Calcium 8.7 - 10.3 mg/dL 9.6  AST 8 - 39 U/L 14  ALT 6 - 57 U/L 15  Alk Phos (alkaline Phosphatase) 34 - 104 U/L 82  Albumin 3.5 - 4.8 g/dL 4.0  Bilirubin, Total 0.3 - 1.2 mg/dL 0.6  Protein, Total 6.1 - 7.9 g/dL 6.5  A/G Ratio 1.0 - 5.0 gm/dL 1.6  Resulting Agency  [facility] - LAB   Specimen Collected: 05/12/22 09:22   Performed by: [place]: 05/12/22 14:22  Received From: [facility]  Result Received: 05/31/22 09:22   Received Information Cholesterol, Total 100 - 200 mg/dL 159  Triglyceride 35 - 199 mg/dL 100  HDL (High Density Lipoprotein) Cholesterol 29.0 - 71.0 mg/dL 42.1  LDL Calculated 0 - 130 mg/dL 97  VLDL Cholesterol mg/dL 20  Cholesterol/HDL Ratio  3.8  Resulting Agency  [facility] - [facility]   Specimen Collected: 05/12/22 09:22   Performed by: [place]: 05/12/22 14:37  Received From: [facility]  Result Received: 05/31/22 09:22   Hemoglobin A1C 4.2 - 5.6 % 5.7 High   Average Blood Glucose (Calc) mg/dL [facility] - LAB  Narrative Performed by [facility] - LAB Normal Range:    4.2 - 5.6% Increased Risk:  5.7 - 6.4% Diabetes:        >= 6.5% Glycemic Control for adults with diabetes:  <7%    Specimen Collected: 05/12/22 09:22  Performed by: [place]: 05/12/22 17:31  Received From: [facility]  Result Received: 05/31/22 09:22   Urinalysis See EPIC and HPI I have reviewed the labs.   Pertinent Imaging:  06/05/22 14:05  Scan Result 182m    Assessment & Plan:    1. Gross hematuria -UA 21-50 RBC's -urine culture pending -start Macrobid 100 mg, twice daily for seven days -complaints of gross heme -cysto's x 3 - no malignancy-hypervascular prostate -CT's x 3 - no  malignancy-nephrolithiasis -order RUS for upper tract imaging  2. BPH with LU TS -massive BPH -s/p UroLift 2021  3. Nephrolithiasis -9 mm stone in inferior pole of left kidney   Return in about 1 month (around 07/05/2022) for RUS, UA and symptom recheck.  These notes generated with voice recognition software. I apologize for typographical errors.  [place] [place] [phone]

## 2022-06-07 ENCOUNTER — Telehealth: Payer: Self-pay

## 2022-06-07 LAB — URINE CULTURE: Culture: NO GROWTH

## 2022-06-07 NOTE — Telephone Encounter (Signed)
Notified pt as advised, pt expressed understanding and states he has his RUS scheduled for the end of the month.

## 2022-06-07 NOTE — Telephone Encounter (Signed)
-----   Message from Nori Riis, PA-C sent at 06/07/2022  8:30 AM EST ----- Please let Bradley [name] know that his urine culture was negative for infection.  He can stop the Macrobid.

## 2022-06-12 ENCOUNTER — Ambulatory Visit: Payer: Medicare Other

## 2022-06-13 ENCOUNTER — Telehealth: Payer: Self-pay

## 2022-06-13 ENCOUNTER — Ambulatory Visit
Admission: RE | Admit: 2022-06-13 | Discharge: 2022-06-13 | Disposition: A | Discharge status: Home / Self Care | Payer: Medicare Other | Source: Ambulatory Visit | Attending: Urology | Admitting: Urology

## 2022-06-13 DIAGNOSIS — R310 Gross hematuria: Secondary | ICD-10-CM | POA: Diagnosis present

## 2022-06-13 NOTE — Telephone Encounter (Signed)
Notified pt as advised, pt expressed understanding.  ?

## 2022-06-13 NOTE — Telephone Encounter (Signed)
-----   Message from Nori Riis, PA-C sent at 06/13/2022  3:26 PM EST ----- Please let Mr. Manera know that his renal ultrasound did not show anything that needed acute intervention.  He had benign cysts and an enlarged prostate.  I do recommend that he keep his appointment with Dr. Bernardo Heater in January.

## 2022-07-05 ENCOUNTER — Ambulatory Visit: Payer: Medicare Other | Admitting: Urology

## 2022-07-21 ENCOUNTER — Ambulatory Visit: Payer: Medicare Other | Admitting: Urology

## 2022-08-30 ENCOUNTER — Encounter: Payer: Self-pay | Admitting: Urology

## 2022-08-30 ENCOUNTER — Ambulatory Visit (INDEPENDENT_AMBULATORY_CARE_PROVIDER_SITE_OTHER): Payer: Medicare Other | Admitting: Urology

## 2022-08-30 VITALS — BP 152/83 | HR 82 | Ht 74.0 in | Wt 220.0 lb

## 2022-08-30 DIAGNOSIS — N401 Enlarged prostate with lower urinary tract symptoms: Secondary | ICD-10-CM | POA: Diagnosis not present

## 2022-08-30 DIAGNOSIS — R350 Frequency of micturition: Secondary | ICD-10-CM

## 2022-08-30 MED ORDER — TAMSULOSIN HCL 0.4 MG PO CAPS: 0.4000 mg | ORAL_CAPSULE | Freq: Every day | ORAL | 3 refills | Status: DC

## 2022-08-30 MED ORDER — FINASTERIDE 5 MG PO TABS: 5.0000 mg | ORAL_TABLET | Freq: Every day | ORAL | 3 refills | Status: AC

## 2022-08-30 NOTE — Progress Notes (Signed)
08/30/2022 3:09 PM   Bradley [name] [birth year] [id number]  Referring provider: Sofie Hartigan, MD [place],  Bradley [name] [id number]  Chief Complaint  Patient presents with   Benign Prostatic Hypertrophy    Urologic history:  1.  Elevated PSA             -Prostate biopsy 2009 PSA 8.3; focus high-grade PIN             -Repeat biopsy 2011 uncorrected PSA 10.4; benign    2.  BPH with lower urinary tract symptoms             -On tamsulosin/finasteride             -PVP 2008             -UroLift 01/13/2020   3.  Erectile dysfunction             -Uses vacuum erection device periodically  HPI: 82 y.o. male presents for follow-up.  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  At times has increased urinary frequency with small-volume urge incontinence.  PA visit November 2023 for episode gross hematuria felt secondary to BPH As previously seen Bradley [name] to discuss HoLEP and he elected to hold off    PMH: Past Medical History:  Diagnosis Date   Allergy    Barrett's esophagus    Blockage of coronary artery of heart (HCC)    BPH (benign prostatic hyperplasia)    Cataracts, bilateral    Chronic diastolic CHF (congestive heart failure), NYHA class 2 ([place]) 12/16/2014   Colon polyp    Coronary artery disease    CRI (chronic renal insufficiency), stage 3 (moderate) ([facility] [place]) 07/20/2020   DDD (degenerative disc disease), lumbosacral 12/05/2017   Diabetes mellitus without complication ([place])    usually running low. to see endocrinologist 01/2020    Essential hypertension 06/05/2014   GERD (gastroesophageal reflux disease)    Barretts esophagus   GI bleed    a. ~2012 around time of hiatal hernia surgery. 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   Glaucoma    Glaucoma    Hemorrhoids    History of chicken pox    History of hiatal hernia 2012   Hyperlipidemia    Meningioma (HCC)     stable, followed by yearly MRIs   Migraines    Neuromuscular disorder (HCC)    neuropathies of lower extrems   RBBB    also, AF and bradycardia   Stomach ulcer     Surgical History: Past Surgical History:  Procedure Laterality Date   ATHERECTOMY  1992   CARDIAC CATHETERIZATION N/A 01/07/2015   Procedure: Left Heart Cath;  Surgeon: Bradley Skains, MD;  Location: [facility];  Service: Cardiovascular;  Laterality: N/A;   CARDIAC SURGERY     coronary angioplasty   CHOLECYSTECTOMY     COLONOSCOPY  11/03/08, 03/09/14   COLONOSCOPY WITH PROPOFOL N/A 01/12/2021   Procedure: COLONOSCOPY WITH PROPOFOL;  Surgeon: [name], Bradley Pike, MD;  Location: [facility];  Service: Gastroenterology;  Laterality: N/A;   CORONARY ANGIOPLASTY     CORONARY ARTERY BYPASS GRAFT N/A 01/19/2015   Procedure: CORONARY ARTERY BYPASS GRAFTING (CABG), with LIM to LAD, vein to left intermediate, vein to PD, vein to OM, using right greater Bradley [name] vein via endovein harvest.;  Surgeon: Bradley Isaac, MD;  Location: [facility];  Service: Open Heart Surgery;  Laterality: N/A;  CYSTOSCOPY WITH INSERTION OF UROLIFT N/A 01/13/2020   Procedure: CYSTOSCOPY WITH INSERTION OF UROLIFT;  Surgeon: Bradley Sons, MD;  Location: [facility];  Service: Urology;  Laterality: N/A;   DIAGNOSTIC LAPAROSCOPY     cholecystectomy and hiatal hernia repair   ENDOVEIN HARVEST OF GREATER Bradley [name] VEIN Right 01/19/2015   Procedure: ENDOVEIN HARVEST OF GREATER Bradley [name] VEIN;  Surgeon: Bradley Isaac, MD;  Location: [facility];  Service: Open Heart Surgery;  Laterality: Right;   ESOPHAGOGASTRODUODENOSCOPY  02/19/13   ESOPHAGOGASTRODUODENOSCOPY (EGD) WITH PROPOFOL N/A 08/09/2015   Procedure: ESOPHAGOGASTRODUODENOSCOPY (EGD) WITH PROPOFOL;  Surgeon: Bradley Luster, MD;  Location: [facility];  Service: Gastroenterology;  Laterality: N/A;   ESOPHAGOGASTRODUODENOSCOPY (EGD) WITH PROPOFOL N/A 04/13/2017   Procedure: ESOPHAGOGASTRODUODENOSCOPY (EGD) WITH  PROPOFOL;  Surgeon: [name], Bradley Pike, MD;  Location: [facility]OS[facility];  Service: Endoscopy;  Laterality: N/A;   ESOPHAGUS SURGERY     EYE SURGERY     cataracts BIL   HERNIA REPAIR     HIATAL HERNIA REPAIR N/A    POLYPECTOMY     TEE WITHOUT CARDIOVERSION N/A 01/19/2015   Procedure: TRANSESOPHAGEAL ECHOCARDIOGRAM (TEE);  Surgeon: Bradley Isaac, MD;  Location: [facility];  Service: Open Heart Surgery;  Laterality: N/A;    Home Medications:  Allergies as of 08/30/2022       Reactions   Metoprolol Other (See Comments)   Hypotension   Sulfadiazine Other (See Comments)   unknown   Oxycodone Other (See Comments)   Reaction:  Hypotension    Sulfa Antibiotics Rash        Medication List        Accurate as of August 30, 2022  3:09 PM. If you have any questions, ask your nurse or doctor.          acetaminophen 500 MG tablet Commonly known as: TYLENOL Take 500 mg by mouth every 6 (six) hours as needed for mild pain or moderate pain.   aspirin EC 81 MG tablet Take 81 mg by mouth daily.   atorvastatin 40 MG tablet Commonly known as: LIPITOR TAKE ONE-HALF TABLET BY MOUTH QHS (ATBEDTIME)   Brinzolamide-Brimonidine 1-0.2 % Susp Place 1 drop into both eyes 2 (two) times daily.   carboxymethylcellulose 0.5 % Soln Commonly known as: REFRESH PLUS INSTILL 1 DROP IN EACH EYE FOUR TIMES A DAY AS NEEDED   cetirizine 10 MG tablet Commonly known as: ZYRTEC Take 10 mg by mouth daily.   chlorhexidine 0.12 % solution Commonly known as: PERIDEX Use as directed 15 mLs in the mouth or throat 2 (two) times daily as needed (for gum inflammation).   cyanocobalamin 2000 MCG tablet Take 2,000 mcg by mouth daily.   docusate sodium 100 MG capsule Commonly known as: COLACE Take 1 capsule (100 mg total) by mouth every 12 (twelve) hours.   dorzolamide 2 % ophthalmic solution Commonly known as: TRUSOPT Apply to eye.   dorzolamide-timolol 2-0.5 % ophthalmic solution Commonly known as:  COSOPT Apply 1 drop to eye 2 (two) times daily.   eucerin cream Apply 1 application topically daily as needed for dry skin.   finasteride 5 MG tablet Commonly known as: PROSCAR Take 1 tablet (5 mg total) by mouth daily.   fluticasone 50 MCG/ACT nasal spray Commonly known as: FLONASE Place 2 sprays into both nostrils daily as needed for allergies.   furosemide 20 MG tablet Commonly known as: LASIX Take 20 mg by mouth daily.   hydrocortisone 2.5 % rectal cream Commonly known as:  ANUSOL-HC Place 1 application rectally daily as needed for hemorrhoids.   ketoconazole 2 % cream Commonly known as: NIZORAL Apply 1 application topically 2 (two) times daily.   Lanolin Alcohol Wax Apply topically.   latanoprost 0.005 % ophthalmic solution Commonly known as: XALATAN Place 1 drop into both eyes at bedtime.   Magnesium 250 MG Tabs Take 250 mg by mouth daily.   nitrofurantoin (macrocrystal-monohydrate) 100 MG capsule Commonly known as: MACROBID Take 1 capsule (100 mg total) by mouth every 12 (twelve) hours.   omeprazole 40 MG capsule Commonly known as: PRILOSEC Take 40 mg by mouth daily.   Polyethyl Glycol-Propyl Glycol 0.4-0.3 % Soln Place 1 drop into both eyes daily as needed for dry eyes.   pravastatin 20 MG tablet Commonly known as: PRAVACHOL Take 20 mg by mouth daily.   rivastigmine 3 MG capsule Commonly known as: EXELON Take by mouth.   Rollator Ultra-Light Misc Use 1 each once daily   tamsulosin 0.4 MG Caps capsule Commonly known as: FLOMAX Take 1 capsule (0.4 mg total) by mouth daily.   timolol 0.25 % ophthalmic solution Commonly known as: TIMOPTIC Place 1 drop into both eyes daily.   triamcinolone cream 0.1 % Commonly known as: KENALOG Apply 1 application topically 2 (two) times daily as needed (Foot).        Allergies:  Allergies  Allergen Reactions   Metoprolol Other (See Comments)    Hypotension   Sulfadiazine Other (See Comments)     unknown   Oxycodone Other (See Comments)    Reaction:  Hypotension    Sulfa Antibiotics Rash    Family History: Family History  Problem Relation Age of Onset   Parkinsonism Mother    Kidney disease Father    Cancer Father        prostate   Heart disease Father    Cancer Paternal Uncle        esophageal   Cancer Paternal Uncle     Social History:  reports that he has never smoked. He has never used smokeless tobacco. He reports that he does not drink alcohol and does not use drugs.   Physical Exam: BP (!) 152/83   Pulse 82   Ht 6' 2"$  (1.88 m)   Wt 220 lb (99.8 kg)   BMI 28.25 kg/m   Constitutional:  Alert, no acute distress. HEENT: [place] AT Respiratory: Normal respiratory effort, no increased work of breathing.   Assessment & Plan:    1.  BPH with LUTS Stable Tamsulosin/finasteride refilled He inquired about possible procedures if his voiding symptoms worsened.  With recent diagnosis of dementia would avoid general anesthesia.  PAE may be his best option if he needed a procedure   Bradley Sons, MD  [facility] [address] [place], [place] [address] [phone]

## 2022-08-31 LAB — URINALYSIS, COMPLETE
Bilirubin, UA: NEGATIVE
Glucose, UA: NEGATIVE
Ketones, UA: NEGATIVE
Leukocytes,UA: NEGATIVE
Nitrite, UA: NEGATIVE
Protein,UA: NEGATIVE
RBC, UA: NEGATIVE
Specific Gravity, UA: 1.025 (ref 1.005–1.030)
Urobilinogen, Ur: 0.2 mg/dL (ref 0.2–1.0)
pH, UA: 5 (ref 5.0–7.5)

## 2022-08-31 LAB — MICROSCOPIC EXAMINATION

## 2023-03-27 ENCOUNTER — Other Ambulatory Visit: Payer: Self-pay | Admitting: Student

## 2023-03-27 DIAGNOSIS — F02818 Dementia in other diseases classified elsewhere, unspecified severity, with other behavioral disturbance: Secondary | ICD-10-CM

## 2023-03-30 ENCOUNTER — Ambulatory Visit
Admission: RE | Admit: 2023-03-30 | Discharge: 2023-03-30 | Disposition: A | Discharge status: Home / Self Care | Payer: Medicare Other | Source: Ambulatory Visit | Attending: Student | Admitting: Student

## 2023-03-30 DIAGNOSIS — F02818 Dementia in other diseases classified elsewhere, unspecified severity, with other behavioral disturbance: Secondary | ICD-10-CM | POA: Insufficient documentation

## 2023-03-30 DIAGNOSIS — G3183 Dementia with Lewy bodies: Secondary | ICD-10-CM | POA: Insufficient documentation

## 2023-04-19 ENCOUNTER — Other Ambulatory Visit: Payer: Self-pay | Admitting: Student

## 2023-04-19 DIAGNOSIS — I639 Cerebral infarction, unspecified: Secondary | ICD-10-CM

## 2023-04-28 ENCOUNTER — Other Ambulatory Visit: Payer: Self-pay

## 2023-04-28 ENCOUNTER — Emergency Department
Admission: EM | Admit: 2023-04-28 | Discharge: 2023-04-29 | Disposition: A | Discharge status: Against Medical Advice | Payer: Medicare Other | Attending: Emergency Medicine | Admitting: Emergency Medicine

## 2023-04-28 DIAGNOSIS — U071 COVID-19: Secondary | ICD-10-CM | POA: Insufficient documentation

## 2023-04-28 DIAGNOSIS — Z5321 Procedure and treatment not carried out due to patient leaving prior to being seen by health care provider: Secondary | ICD-10-CM | POA: Diagnosis not present

## 2023-04-28 NOTE — ED Triage Notes (Signed)
Pt to ed from [facility] via [facility] fro testing COVID + today at home. Pt has no major symptoms other than fatigue. Pt is CAOx4, in no acute distress in triage. Facility called 911 to have him transported. Pt doesn't know why.

## 2023-04-28 NOTE — ED Notes (Signed)
Pt is asking to leave. Pt feels fine. His wife is here to pick him up.

## 2023-05-02 ENCOUNTER — Other Ambulatory Visit: Payer: Self-pay | Admitting: Urology

## 2023-08-31 ENCOUNTER — Ambulatory Visit: Payer: Self-pay | Admitting: Urology

## 2023-10-04 ENCOUNTER — Encounter: Payer: Self-pay | Admitting: Urology

## 2023-10-04 ENCOUNTER — Ambulatory Visit (INDEPENDENT_AMBULATORY_CARE_PROVIDER_SITE_OTHER): Payer: Medicare Other | Admitting: Urology

## 2023-10-04 VITALS — BP 127/73 | HR 98 | Ht 74.0 in | Wt 220.0 lb

## 2023-10-04 DIAGNOSIS — R3915 Urgency of urination: Secondary | ICD-10-CM

## 2023-10-04 DIAGNOSIS — R350 Frequency of micturition: Secondary | ICD-10-CM | POA: Diagnosis not present

## 2023-10-04 DIAGNOSIS — N401 Enlarged prostate with lower urinary tract symptoms: Secondary | ICD-10-CM

## 2023-10-04 DIAGNOSIS — R310 Gross hematuria: Secondary | ICD-10-CM

## 2023-10-04 LAB — URINALYSIS, COMPLETE
Bilirubin, UA: NEGATIVE
Glucose, UA: NEGATIVE
Ketones, UA: NEGATIVE
Leukocytes,UA: NEGATIVE
Nitrite, UA: NEGATIVE
Protein,UA: NEGATIVE
RBC, UA: NEGATIVE
Specific Gravity, UA: 1.02 (ref 1.005–1.030)
Urobilinogen, Ur: 0.2 mg/dL (ref 0.2–1.0)
pH, UA: 6.5 (ref 5.0–7.5)

## 2023-10-04 LAB — MICROSCOPIC EXAMINATION
Bacteria, UA: NONE SEEN
Epithelial Cells (non renal): NONE SEEN /HPF (ref 0–10)

## 2023-10-04 NOTE — Patient Instructions (Signed)
 Take 1 tab daily of Gemtea sample . Call back if they work well.

## 2023-10-04 NOTE — Progress Notes (Signed)
 I, Maysun Anabel Bene, acting as a scribe for Riki Altes, MD., have documented all relevant documentation on the behalf of Riki Altes, MD, as directed by Riki Altes, MD while in the presence of Riki Altes, MD.  10/04/2023 5:37 PM   Bradley [name] [birth year] [id number]  Referring provider: Marina Goodell, MD [address] [place],  [place] [address]  Chief Complaint  Patient presents with   Benign Prostatic Hypertrophy   Urologic history: 1.  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  BPH with lower urinary tract symptoms On tamsulosin/finasteride PVP 2008 UroLift 01/13/2020   3.  Erectile dysfunction Uses vacuum erection device periodically  HPI: Bradley [name] is a 83 y.o. male presents for annual follow-up.  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 He has also moved to an assisted living facility.  His only complaint has been urinary urgency when changing from a supine/sitting to a standing position. This is bothersome, though he has not had significant urge incontinence.  Remains on tamsulosin and finasteride.  Denies gross hematuria or dysuria.  Denies flank, abdominal, or pelvic pain.   PMH: Past Medical History:  Diagnosis Date   Allergy    Barrett's esophagus    Blockage of coronary artery of heart (HCC)    BPH (benign prostatic hyperplasia)    Cataracts, bilateral    Chronic diastolic CHF (congestive heart failure), NYHA class 2 (HCC) 12/16/2014   Colon polyp    Coronary artery disease    CRI (chronic renal insufficiency), stage 3 (moderate) (HCC) 07/20/2020   DDD (degenerative disc disease), lumbosacral 12/05/2017   Diabetes mellitus without complication (HCC)    usually running low. to see endocrinologist 01/2020    Essential hypertension 06/05/2014   GERD (gastroesophageal reflux disease)    Barretts esophagus   GI  bleed    a. ~2012 around time of hiatal hernia surgery. 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   Glaucoma    Glaucoma    Hemorrhoids    History of chicken pox    History of hiatal hernia 2012   Hyperlipidemia    Meningioma (HCC)    stable, followed by yearly MRIs   Migraines    Neuromuscular disorder (HCC)    neuropathies of lower extrems   RBBB    also, AF and bradycardia   Stomach ulcer     Surgical History: Past Surgical History:  Procedure Laterality Date   ATHERECTOMY  1992   CARDIAC CATHETERIZATION N/A 01/07/2015   Procedure: Left Heart Cath;  Surgeon: Lamar Blinks, MD;  Location: [facility];  Service: Cardiovascular;  Laterality: N/A;   CARDIAC SURGERY     coronary angioplasty   CHOLECYSTECTOMY     COLONOSCOPY  11/03/08, 03/09/14   COLONOSCOPY WITH PROPOFOL N/A 01/12/2021   Procedure: COLONOSCOPY WITH PROPOFOL;  Surgeon: Toledo, Boykin Nearing, MD;  Location: [facility] ENDOSCOPY;  Service: Gastroenterology;  Laterality: N/A;   CORONARY ANGIOPLASTY     CORONARY ARTERY BYPASS GRAFT N/A 01/19/2015   Procedure: CORONARY ARTERY BYPASS GRAFTING (CABG), with LIM to LAD, vein to left intermediate, vein to PD, vein to OM, using right greater saphenous vein via endovein harvest.;  Surgeon: Delight Ovens, MD;  Location: [facility] OR;  Service: Open Heart Surgery;  Laterality: N/A;   CYSTOSCOPY WITH INSERTION OF UROLIFT  N/A 01/13/2020   Procedure: CYSTOSCOPY WITH INSERTION OF UROLIFT;  Surgeon: Riki Altes, MD;  Location: [facility] ORS;  Service: Urology;  Laterality: N/A;   DIAGNOSTIC LAPAROSCOPY     cholecystectomy and hiatal hernia repair   ENDOVEIN HARVEST OF GREATER SAPHENOUS VEIN Right 01/19/2015   Procedure: ENDOVEIN HARVEST OF GREATER SAPHENOUS VEIN;  Surgeon: Delight Ovens, MD;  Location: [facility] OR;  Service: Open Heart Surgery;  Laterality: Right;   ESOPHAGOGASTRODUODENOSCOPY  02/19/13   ESOPHAGOGASTRODUODENOSCOPY (EGD) WITH PROPOFOL N/A 08/09/2015    Procedure: ESOPHAGOGASTRODUODENOSCOPY (EGD) WITH PROPOFOL;  Surgeon: Wallace Cullens, MD;  Location: [facility] ENDOSCOPY;  Service: Gastroenterology;  Laterality: N/A;   ESOPHAGOGASTRODUODENOSCOPY (EGD) WITH PROPOFOL N/A 04/13/2017   Procedure: ESOPHAGOGASTRODUODENOSCOPY (EGD) WITH PROPOFOL;  Surgeon: Toledo, Boykin Nearing, MD;  Location: [facility] ENDOSCOPY;  Service: Endoscopy;  Laterality: N/A;   ESOPHAGUS SURGERY     EYE SURGERY     cataracts BIL   HERNIA REPAIR     HIATAL HERNIA REPAIR N/A    POLYPECTOMY     TEE WITHOUT CARDIOVERSION N/A 01/19/2015   Procedure: TRANSESOPHAGEAL ECHOCARDIOGRAM (TEE);  Surgeon: Delight Ovens, MD;  Location: [facility] OR;  Service: Open Heart Surgery;  Laterality: N/A;    Home Medications:  Allergies as of 10/04/2023       Reactions   Metoprolol Other (See Comments)   Hypotension   Sulfadiazine Other (See Comments)   unknown   Oxycodone Other (See Comments)   Reaction:  Hypotension    Sulfa Antibiotics Rash        Medication List        Accurate as of October 04, 2023  5:37 PM. If you have any questions, ask your nurse or doctor.          acetaminophen 500 MG tablet Commonly known as: TYLENOL Take 500 mg by mouth every 6 (six) hours as needed for mild pain or moderate pain.   aspirin EC 81 MG tablet Take 81 mg by mouth daily.   atorvastatin 40 MG tablet Commonly known as: LIPITOR TAKE ONE-HALF TABLET BY MOUTH QHS (ATBEDTIME)   Brinzolamide-Brimonidine 1-0.2 % Susp Place 1 drop into both eyes 2 (two) times daily.   carboxymethylcellulose 0.5 % Soln Commonly known as: REFRESH PLUS INSTILL 1 DROP IN EACH EYE FOUR TIMES A DAY AS NEEDED   cetirizine 10 MG tablet Commonly known as: ZYRTEC Take 10 mg by mouth daily.   chlorhexidine 0.12 % solution Commonly known as: PERIDEX Use as directed 15 mLs in the mouth or throat 2 (two) times daily as needed (for gum inflammation).   cyanocobalamin 2000 MCG tablet Take 2,000 mcg by mouth daily.   docusate  sodium 100 MG capsule Commonly known as: COLACE Take 1 capsule (100 mg total) by mouth every 12 (twelve) hours.   dorzolamide 2 % ophthalmic solution Commonly known as: TRUSOPT Apply to eye.   dorzolamide-timolol 2-0.5 % ophthalmic solution Commonly known as: COSOPT Apply 1 drop to eye 2 (two) times daily.   eucerin cream Apply 1 application topically daily as needed for dry skin.   finasteride 5 MG tablet Commonly known as: PROSCAR Take 1 tablet (5 mg total) by mouth daily.   fluticasone 50 MCG/ACT nasal spray Commonly known as: FLONASE Place 2 sprays into both nostrils daily as needed for allergies.   hydrocortisone 2.5 % rectal cream Commonly known as: ANUSOL-HC Place 1 application rectally daily as needed for hemorrhoids.   ketoconazole 2 % cream Commonly known as: NIZORAL Apply  1 application topically 2 (two) times daily.   Lanolin Alcohol Wax Apply topically.   latanoprost 0.005 % ophthalmic solution Commonly known as: XALATAN Place 1 drop into both eyes at bedtime.   Magnesium 250 MG Tabs Take 250 mg by mouth daily.   omeprazole 40 MG capsule Commonly known as: PRILOSEC Take 40 mg by mouth daily.   Polyethyl Glycol-Propyl Glycol 0.4-0.3 % Soln Place 1 drop into both eyes daily as needed for dry eyes.   pravastatin 20 MG tablet Commonly known as: PRAVACHOL Take 20 mg by mouth daily.   rivastigmine 3 MG capsule Commonly known as: EXELON Take by mouth.   Rollator Ultra-Light Misc Use 1 each once daily   tamsulosin 0.4 MG Caps capsule Commonly known as: FLOMAX Take 1 capsule (0.4 mg total) by mouth daily.   timolol 0.25 % ophthalmic solution Commonly known as: TIMOPTIC Place 1 drop into both eyes daily.   triamcinolone cream 0.1 % Commonly known as: KENALOG Apply 1 application topically 2 (two) times daily as needed (Foot).        Allergies:  Allergies  Allergen Reactions   Metoprolol Other (See Comments)    Hypotension   Sulfadiazine  Other (See Comments)    unknown   Oxycodone Other (See Comments)    Reaction:  Hypotension    Sulfa Antibiotics Rash    Family History: Family History  Problem Relation Age of Onset   Parkinsonism Mother    Kidney disease Father    Cancer Father        prostate   Heart disease Father    Cancer Paternal Uncle        esophageal   Cancer Paternal Uncle     Social History:  reports that he has never smoked. He has never used smokeless tobacco. He reports that he does not drink alcohol and does not use drugs.   Physical Exam: BP 127/73   Pulse 98   Ht 6\' 2"  (1.88 m)   Wt 220 lb (99.8 kg)   BMI 28.25 kg/m   Constitutional:  Alert and oriented, No acute distress. HEENT: [place] AT, moist mucus membranes.  Trachea midline, no masses. Cardiovascular: No clubbing, cyanosis, or edema. Respiratory: Normal respiratory effort, no increased work of breathing. GI: Abdomen is soft, nontender, nondistended, no abdominal masses Skin: No rashes, bruises or suspicious lesions. Neurologic: Grossly intact, no focal deficits, moving all 4 extremities. Psychiatric: Normal mood and affect.  Assessment & Plan:    1. BPH with LUTS Worsening urge symptoms which are positionally related Continue tamsulosin/finasteride Trial Gemtesa 75 mg daily-samples given x1 month, and he will call back regarding efficacy.  [facility] [address] [place], [place] [address] [phone]

## 2023-10-05 ENCOUNTER — Encounter: Payer: Self-pay | Admitting: Urology

## 2024-02-12 ENCOUNTER — Telehealth: Payer: Self-pay

## 2024-02-12 NOTE — Telephone Encounter (Signed)
 Patient called in and has concerns with decreased urination. Patient is wondering if he needs to come in to get cathed or go in to the ER. Patient states drinking 2 4 oz bottles of water  since last night and drinking with meals. Patients is stating only have a little urine but no pain., left detailed message after discussing with Sam, PA and her suggestion at this time if patient isn't in pain and is still able to urinate to increase fluids. But at any time if he starts to have pain and feels he needs to be seen then seek medical attention in the ER.
